# Patient Record
Sex: Female | Born: 1965
Health system: Southern US, Community
[De-identification: ages and names within clinical notes are randomized; demographics above are authoritative.]

## PROBLEM LIST (undated history)

## (undated) DIAGNOSIS — K219 Gastro-esophageal reflux disease without esophagitis: Secondary | ICD-10-CM

## (undated) DIAGNOSIS — I639 Cerebral infarction, unspecified: Secondary | ICD-10-CM

## (undated) DIAGNOSIS — M792 Neuralgia and neuritis, unspecified: Secondary | ICD-10-CM

## (undated) DIAGNOSIS — J45909 Unspecified asthma, uncomplicated: Secondary | ICD-10-CM

## (undated) DIAGNOSIS — K5792 Diverticulitis of intestine, part unspecified, without perforation or abscess without bleeding: Secondary | ICD-10-CM

## (undated) DIAGNOSIS — R0789 Other chest pain: Secondary | ICD-10-CM

## (undated) DIAGNOSIS — N8111 Cystocele, midline: Secondary | ICD-10-CM

## (undated) DIAGNOSIS — E785 Hyperlipidemia, unspecified: Secondary | ICD-10-CM

## (undated) DIAGNOSIS — J302 Other seasonal allergic rhinitis: Secondary | ICD-10-CM

## (undated) DIAGNOSIS — G4730 Sleep apnea, unspecified: Secondary | ICD-10-CM

## (undated) DIAGNOSIS — R0781 Pleurodynia: Secondary | ICD-10-CM

## (undated) DIAGNOSIS — M5430 Sciatica, unspecified side: Secondary | ICD-10-CM

## (undated) DIAGNOSIS — I10 Essential (primary) hypertension: Secondary | ICD-10-CM

## (undated) DIAGNOSIS — R1012 Left upper quadrant pain: Secondary | ICD-10-CM

## (undated) HISTORY — PX: APPENDECTOMY: SHX54

## (undated) HISTORY — PX: ABDOMINAL HYSTERECTOMY: SHX81

## (undated) HISTORY — DX: Gastro-esophageal reflux disease without esophagitis: K21.9

## (undated) HISTORY — PX: ANAL FISSURE REPAIR: SHX2312

## (undated) HISTORY — DX: Neuralgia and neuritis, unspecified: M79.2

## (undated) HISTORY — DX: Unspecified asthma, uncomplicated: J45.909

## (undated) HISTORY — DX: Cystocele, midline: N81.11

## (undated) HISTORY — DX: Cerebral infarction, unspecified: I63.9

## (undated) HISTORY — DX: Left upper quadrant pain: R10.12

## (undated) HISTORY — PX: WISDOM TOOTH EXTRACTION: SHX21

## (undated) HISTORY — DX: Pleurodynia: R07.81

## (undated) HISTORY — PX: FOOT SURGERY: SHX648

## (undated) HISTORY — DX: Other chest pain: R07.89

## (undated) HISTORY — PX: TUBAL LIGATION: SHX77

## (undated) HISTORY — PX: CHOLECYSTECTOMY: SHX55

---

## 1998-08-22 DIAGNOSIS — I639 Cerebral infarction, unspecified: Secondary | ICD-10-CM

## 1998-08-22 HISTORY — DX: Cerebral infarction, unspecified: I63.9

## 1999-08-23 DIAGNOSIS — I6930 Unspecified sequelae of cerebral infarction: Secondary | ICD-10-CM

## 1999-08-23 HISTORY — DX: Unspecified sequelae of cerebral infarction: I69.30

## 2011-08-22 ENCOUNTER — Emergency Department (HOSPITAL_COMMUNITY)
Admission: EM | Admit: 2011-08-22 | Discharge: 2011-08-22 | Disposition: A | Discharge status: Home / Self Care | Payer: Medicare Other | Attending: Emergency Medicine | Admitting: Emergency Medicine

## 2011-08-22 ENCOUNTER — Other Ambulatory Visit: Payer: Self-pay

## 2011-08-22 ENCOUNTER — Emergency Department (HOSPITAL_COMMUNITY): Payer: Medicare Other

## 2011-08-22 ENCOUNTER — Encounter: Payer: Self-pay | Admitting: *Deleted

## 2011-08-22 DIAGNOSIS — J069 Acute upper respiratory infection, unspecified: Secondary | ICD-10-CM

## 2011-08-22 DIAGNOSIS — IMO0001 Reserved for inherently not codable concepts without codable children: Secondary | ICD-10-CM | POA: Insufficient documentation

## 2011-08-22 DIAGNOSIS — R110 Nausea: Secondary | ICD-10-CM | POA: Insufficient documentation

## 2011-08-22 DIAGNOSIS — R0602 Shortness of breath: Secondary | ICD-10-CM | POA: Insufficient documentation

## 2011-08-22 DIAGNOSIS — I10 Essential (primary) hypertension: Secondary | ICD-10-CM | POA: Insufficient documentation

## 2011-08-22 DIAGNOSIS — R059 Cough, unspecified: Secondary | ICD-10-CM | POA: Insufficient documentation

## 2011-08-22 DIAGNOSIS — R05 Cough: Secondary | ICD-10-CM | POA: Insufficient documentation

## 2011-08-22 DIAGNOSIS — R51 Headache: Secondary | ICD-10-CM | POA: Insufficient documentation

## 2011-08-22 HISTORY — DX: Essential (primary) hypertension: I10

## 2011-08-22 HISTORY — DX: Cerebral infarction, unspecified: I63.9

## 2011-08-22 MED ORDER — HYDROCODONE-HOMATROPINE 5-1.5 MG/5ML PO SYRP: 5.0000 mL | ORAL_SOLUTION | Freq: Four times a day (QID) | ORAL | Status: AC | PRN

## 2011-08-22 MED ORDER — ACETAMINOPHEN 325 MG PO TABS
650.0000 mg | ORAL_TABLET | Freq: Once | ORAL | Status: AC
  Administered 2011-08-22: 650 mg via ORAL
  Filled 2011-08-22: qty 2

## 2011-08-22 MED ORDER — SALINE NASAL SPRAY 0.65 % NA SOLN: 1.0000 | NASAL | Status: DC | PRN

## 2011-08-22 MED ORDER — OXYMETAZOLINE HCL 0.05 % NA SOLN: 2.0000 | Freq: Two times a day (BID) | NASAL | Status: AC

## 2011-08-22 NOTE — ED Notes (Signed)
Pt states "the coughing, h/a, back ache, chest pain, hurting all over started yesterday, I was tx'd for bronchitis about a month ago"

## 2011-08-22 NOTE — ED Notes (Signed)
Pt reports having coughing, congestion, body aches all over x2 days. NAD noted at this time.

## 2011-08-22 NOTE — ED Notes (Signed)
PA at bedside.

## 2011-08-22 NOTE — ED Provider Notes (Signed)
History     CSN: [id number]  Arrival date & time 08/22/11  1233   First MD Initiated Contact with Patient 08/22/11 1339      Chief Complaint  Patient presents with  . Cough  . Headache  . Generalized Body Aches  . Nausea    (Consider location/radiation/quality/duration/timing/severity/associated sxs/prior treatment) Patient is a 45 y.o. female presenting with URI. The history is provided by the patient.  URI The primary symptoms include headaches, cough, nausea and myalgias. Primary symptoms do not include fever, fatigue, ear pain, sore throat, swollen glands, wheezing, abdominal pain, vomiting, arthralgias or rash. The current episode started yesterday. This is a new problem. The problem has not changed since onset. The onset of the illness is associated with exposure to sick contacts (son recently had pneumonia). Symptoms associated with the illness include chills, congestion and rhinorrhea. The illness is not associated with plugged ear sensation, facial pain or sinus pressure.  Sx started yesterday, consisting chiefly of headache, myalgias, cough. Was txed for bronchitis 1 mo ago. Is not immunocompromised.  Past Medical History  Diagnosis Date  . Hypertension   . Stroke     Past Surgical History  Procedure Date  . Appendectomy   . Cholecystectomy   . Abdominal hysterectomy     partial  . Anal fissure repair   . Tubal ligation     History reviewed. No pertinent family history.  History  Substance Use Topics  . Smoking status: Not on file  . Smokeless tobacco: Not on file  . Alcohol Use:     OB History    Grav Para Term Preterm Abortions TAB SAB Ect Mult Living                  Review of Systems  Constitutional: Positive for chills. Negative for fever and fatigue.  HENT: Positive for congestion and rhinorrhea. Negative for ear pain, sore throat and sinus pressure.   Respiratory: Positive for cough. Negative for wheezing.   Gastrointestinal: Positive for  nausea. Negative for vomiting and abdominal pain.  Musculoskeletal: Positive for myalgias. Negative for arthralgias.  Skin: Negative for rash.  Neurological: Positive for headaches.    Allergies  Compazine; Morphine and related; and Sulfa antibiotics  Home Medications   Current Outpatient Rx  Name Route Sig Dispense Refill  . HYDROCHLOROTHIAZIDE 25 MG PO TABS Oral Take 25 mg by mouth daily.      Marland Kitchen METOPROLOL SUCCINATE ER 100 MG PO TB24 Oral Take 100 mg by mouth daily.        BP 137/101  Pulse 99  Temp(Src) 98.6 F (37 C) (Oral)  Resp 18  Wt 170 lb (77.111 kg)  SpO2 100%  Physical Exam  Nursing note and vitals reviewed. Constitutional: She appears well-developed and well-nourished. No distress.  HENT:  Head: Normocephalic and atraumatic.       TMs nl b/l  Eyes: Conjunctivae are normal. Pupils are equal, round, and reactive to light. Right eye exhibits no discharge. Left eye exhibits no discharge.  Neck: Normal range of motion. Neck supple.  Cardiovascular: Normal rate, regular rhythm and normal heart sounds.   Pulmonary/Chest: Effort normal and breath sounds normal. No respiratory distress. She has no wheezes.  Abdominal: Soft. There is no tenderness.  Lymphadenopathy:    She has no cervical adenopathy.  Neurological: She is alert.  Skin: Skin is warm and dry. She is not diaphoretic.    ED Course  Procedures (including critical care time)   Date: 08/22/2011  Rate: 103  Rhythm: sinus tachycardia  QRS Axis: normal  Intervals: normal  ST/T Wave abnormalities: normal  Conduction Disutrbances:none  Narrative Interpretation: "noisy" ECG  Old EKG Reviewed: none available    Labs Reviewed - No data to display Dg Chest 2 View  08/22/2011  *RADIOLOGY REPORT*  Clinical Data: Shortness of breath, cough, congestion  CHEST - 2 VIEW  Comparison:   None  Findings: Cardiomediastinal silhouette is unremarkable.  Mild thoracic dextroscoliosis.  No acute infiltrate or pleural  effusion. No pulmonary edema.  IMPRESSION: No active disease.  Mild thoracic dextroscoliosis.  Original Report Authenticated By: Kathleen Solis, M.D.     1. Upper respiratory infection       MDM  This non-toxic appearing pt presents with URI sx which started 1 day ago. Exam, CXR unremarkable. Will tx as viral URI.        Kathleen Solis, [place] 08/22/11 [phone]

## 2011-08-23 NOTE — ED Provider Notes (Signed)
Medical screening examination/treatment/procedure(s) were performed by non-physician practitioner and as supervising physician I was immediately available for consultation/collaboration.  Hurman Horn, MD 08/23/11 [phone]

## 2014-11-24 ENCOUNTER — Emergency Department (HOSPITAL_COMMUNITY)
Admission: EM | Admit: 2014-11-24 | Discharge: 2014-11-24 | Disposition: A | Discharge status: Home / Self Care | Payer: Medicare PPO | Attending: Emergency Medicine | Admitting: Emergency Medicine

## 2014-11-24 ENCOUNTER — Emergency Department (HOSPITAL_COMMUNITY): Payer: Medicare PPO

## 2014-11-24 ENCOUNTER — Encounter (HOSPITAL_COMMUNITY): Payer: Self-pay | Admitting: *Deleted

## 2014-11-24 DIAGNOSIS — Z9049 Acquired absence of other specified parts of digestive tract: Secondary | ICD-10-CM | POA: Insufficient documentation

## 2014-11-24 DIAGNOSIS — I10 Essential (primary) hypertension: Secondary | ICD-10-CM | POA: Insufficient documentation

## 2014-11-24 DIAGNOSIS — Z79899 Other long term (current) drug therapy: Secondary | ICD-10-CM | POA: Diagnosis not present

## 2014-11-24 DIAGNOSIS — K5900 Constipation, unspecified: Secondary | ICD-10-CM | POA: Diagnosis not present

## 2014-11-24 DIAGNOSIS — R1084 Generalized abdominal pain: Secondary | ICD-10-CM | POA: Diagnosis present

## 2014-11-24 DIAGNOSIS — Z8673 Personal history of transient ischemic attack (TIA), and cerebral infarction without residual deficits: Secondary | ICD-10-CM | POA: Insufficient documentation

## 2014-11-24 DIAGNOSIS — Z90710 Acquired absence of both cervix and uterus: Secondary | ICD-10-CM | POA: Diagnosis not present

## 2014-11-24 HISTORY — DX: Diverticulitis of intestine, part unspecified, without perforation or abscess without bleeding: K57.92

## 2014-11-24 LAB — CBC WITH DIFFERENTIAL/PLATELET
Basophils Absolute: 0 10*3/uL (ref 0.0–0.1)
Basophils Relative: 1 % (ref 0–1)
Eosinophils Absolute: 0.1 10*3/uL (ref 0.0–0.7)
Eosinophils Relative: 1 % (ref 0–5)
HCT: 35.9 % — ABNORMAL LOW (ref 36.0–46.0)
Hemoglobin: 12.9 g/dL (ref 12.0–15.0)
Lymphocytes Relative: 27 % (ref 12–46)
Lymphs Abs: 1.7 10*3/uL (ref 0.7–4.0)
MCH: 32 pg (ref 26.0–34.0)
MCHC: 35.9 g/dL (ref 30.0–36.0)
MCV: 89.1 fL (ref 78.0–100.0)
Monocytes Absolute: 0.5 10*3/uL (ref 0.1–1.0)
Monocytes Relative: 8 % (ref 3–12)
Neutro Abs: 4 10*3/uL (ref 1.7–7.7)
Neutrophils Relative %: 63 % (ref 43–77)
Platelets: 210 10*3/uL (ref 150–400)
RBC: 4.03 MIL/uL (ref 3.87–5.11)
RDW: 13.2 % (ref 11.5–15.5)
WBC: 6.3 10*3/uL (ref 4.0–10.5)

## 2014-11-24 LAB — COMPREHENSIVE METABOLIC PANEL WITH GFR
ALT: 14 U/L (ref 0–35)
AST: 20 U/L (ref 0–37)
Albumin: 3.8 g/dL (ref 3.5–5.2)
Alkaline Phosphatase: 74 U/L (ref 39–117)
Anion gap: 10 (ref 5–15)
BUN: 7 mg/dL (ref 6–23)
CO2: 30 mmol/L (ref 19–32)
Calcium: 9.6 mg/dL (ref 8.4–10.5)
Chloride: 100 mmol/L (ref 96–112)
Creatinine, Ser: 0.78 mg/dL (ref 0.50–1.10)
GFR calc Af Amer: >90 mL/min
GFR calc non Af Amer: >90 mL/min
Glucose, Bld: 93 mg/dL (ref 70–99)
Potassium: 3.6 mmol/L (ref 3.5–5.1)
Sodium: 140 mmol/L (ref 135–145)
Total Bilirubin: 1.3 mg/dL — ABNORMAL HIGH (ref 0.3–1.2)
Total Protein: 6.6 g/dL (ref 6.0–8.3)

## 2014-11-24 LAB — LIPASE, BLOOD: Lipase: 21 U/L (ref 11–59)

## 2014-11-24 MED ORDER — ONDANSETRON HCL 4 MG PO TABS
4.0000 mg | ORAL_TABLET | Freq: Once | ORAL | Status: AC
  Administered 2014-11-24: 4 mg via ORAL
  Filled 2014-11-24: qty 1

## 2014-11-24 MED ORDER — BISACODYL 10 MG RE SUPP
20.0000 mg | Freq: Once | RECTAL | Status: AC
  Administered 2014-11-24: 20 mg via RECTAL
  Filled 2014-11-24: qty 2

## 2014-11-24 MED ORDER — POLYETHYLENE GLYCOL 3350 17 GM/SCOOP PO POWD: ORAL | Status: DC

## 2014-11-24 MED ORDER — ACETAMINOPHEN 325 MG PO TABS
650.0000 mg | ORAL_TABLET | Freq: Once | ORAL | Status: AC
  Administered 2014-11-24: 650 mg via ORAL
  Filled 2014-11-24: qty 2

## 2014-11-24 NOTE — ED Notes (Signed)
  Pt transported to ct 

## 2014-11-24 NOTE — ED Notes (Signed)
Pt with lower abdominal pain and constipation x 1 month.  Has been taking all manner of laxitives and enemas per UC with no relief.

## 2014-11-24 NOTE — ED Provider Notes (Signed)
CSN: [id number]     Arrival date & time 11/24/14  0753 History   First MD Initiated Contact with Patient 11/24/14 [phone]     Chief Complaint  Patient presents with  . Constipation  . Abdominal Pain     (Consider location/radiation/quality/duration/timing/severity/associated sxs/prior Treatment) Patient is a 49 y.o. female presenting with constipation and abdominal pain.  Constipation Severity:  Severe Time since last bowel movement:  4 weeks Timing:  Constant Progression:  Worsening Chronicity:  Recurrent Context: not dehydration and not narcotics   Stool description:  None produced Relieved by:  None tried Worsened by:  Nothing tried Ineffective treatments:  Enemas and laxatives Associated symptoms: abdominal pain and nausea   Associated symptoms: no diarrhea, no dysuria, no fever, no urinary retention and no vomiting   Abdominal pain:    Location:  Generalized   Quality:  Bloating   Severity:  Moderate   Timing:  Intermittent   Progression:  Waxing and waning   Chronicity:  New Nausea:    Severity:  Mild   Timing:  Constant   Progression:  Worsening Risk factors: hx of abdominal surgery   Risk factors: no recent antibiotic use   Abdominal Pain Associated symptoms: constipation and nausea   Associated symptoms: no chest pain, no chills, no cough, no diarrhea, no dysuria, no fever, no shortness of breath and no vomiting     Past Medical History  Diagnosis Date  . Hypertension   . Stroke   . Diverticulitis    Past Surgical History  Procedure Laterality Date  . Appendectomy    . Cholecystectomy    . Abdominal hysterectomy      partial  . Anal fissure repair    . Tubal ligation     No family history on file. History  Substance Use Topics  . Smoking status: Never Smoker   . Smokeless tobacco: Not on file  . Alcohol Use: No   OB History    No data available     Review of Systems  Constitutional: Negative for fever and chills.  HENT: Negative for  nosebleeds.   Eyes: Negative for visual disturbance.  Respiratory: Negative for cough and shortness of breath.   Cardiovascular: Negative for chest pain.  Gastrointestinal: Positive for nausea, abdominal pain and constipation. Negative for vomiting and diarrhea.  Genitourinary: Negative for dysuria.  Skin: Negative for rash.  Neurological: Negative for weakness.  All other systems reviewed and are negative.     Allergies  Compazine; Morphine and related; Percocet; and Sulfa antibiotics  Home Medications   Prior to Admission medications   Medication Sig Start Date End Date Taking? Authorizing Provider  bisacodyl (DULCOLAX) 10 MG suppository Place 10 mg rectally as needed for moderate constipation.   Yes Historical Provider, MD  hydrochlorothiazide (HYDRODIURIL) 25 MG tablet Take 25 mg by mouth daily.     Yes Historical Provider, MD  ibuprofen (ADVIL,MOTRIN) 200 MG tablet Take 400 mg by mouth every 6 (six) hours as needed for mild pain.   Yes Historical Provider, MD  lactulose (CHRONULAC) 10 GM/15ML solution Take 20 g by mouth 2 (two) times daily.   Yes Historical Provider, MD  magnesium hydroxide (MILK OF MAGNESIA) 400 MG/5ML suspension Take 15 mLs by mouth every 8 (eight) hours as needed for mild constipation.   Yes Historical Provider, MD  metoprolol (TOPROL-XL) 100 MG 24 hr tablet Take 100 mg by mouth daily.     Yes Historical Provider, MD  ondansetron (ZOFRAN-ODT) 4 MG disintegrating tablet  Take 4 mg by mouth every 6 (six) hours as needed for nausea or vomiting.   Yes Historical Provider, MD  polyethylene glycol powder (GLYCOLAX/MIRALAX) powder Please take two scoops twice daily in a drink of your choice until you are having regular bowel movements 11/24/14   Silas FloodErik Kiyoto Slomski, MD  sodium chloride (OCEAN NASAL SPRAY) 0.65 % nasal spray Place 1 spray into the nose as needed for congestion. Patient not taking: Reported on 11/24/2014 08/22/11 08/21/12  Grant Fontanaatherine Williams, PA-C   BP 118/70 mmHg   Pulse 60  Temp(Src) 98 F (36.7 C) (Oral)  Resp 16  Ht 5' 5.5" (1.664 m)  Wt 165 lb (74.844 kg)  BMI 27.03 kg/m2  SpO2 96%  LMP  Physical Exam  Constitutional: She is oriented to person, place, and time. No distress.  HENT:  Head: Normocephalic and atraumatic.  Eyes: EOM are normal. Pupils are equal, round, and reactive to light.  Neck: Normal range of motion. Neck supple.  Cardiovascular: Normal rate and intact distal pulses.   Pulmonary/Chest: No respiratory distress.  Abdominal: Soft. There is tenderness (periumbilical). There is no rebound and no guarding.  Musculoskeletal: Normal range of motion.  Neurological: She is alert and oriented to person, place, and time.  Skin: No rash noted. She is not diaphoretic.  Psychiatric: She has a normal mood and affect.    ED Course  Procedures (including critical care time) Labs Review Labs Reviewed  CBC WITH DIFFERENTIAL/PLATELET - Abnormal; Notable for the following:    HCT 35.9 (*)    All other components within normal limits  COMPREHENSIVE METABOLIC PANEL - Abnormal; Notable for the following:    Total Bilirubin 1.3 (*)    All other components within normal limits  LIPASE, BLOOD  URINALYSIS, ROUTINE W REFLEX MICROSCOPIC    Imaging Review Ct Abdomen Pelvis Wo Contrast  11/24/2014   CLINICAL DATA:  One month history of constipation lower abdominal pain  EXAM: CT ABDOMEN AND PELVIS WITHOUT CONTRAST  TECHNIQUE: Multidetector CT imaging of the abdomen and pelvis was performed following the standard protocol without oral or intravenous contrast material administration.  COMPARISON:  None.  FINDINGS: There is mild atelectatic change in the posterior right lung base. Lung bases are otherwise clear.  No focal liver lesions are identified on this noncontrast enhanced study. Gallbladder is absent. There is no biliary duct dilatation appreciable.  Spleen, pancreas, and adrenals appear normal. Kidneys bilaterally show no mass or hydronephrosis  on either side. There is no renal or ureteral calculus on either side.  In the pelvis, the urinary bladder is midline with normal wall thickness. The rectum is mildly distended with stool. There is a cyst arising from the left ovary measuring 3.3 x 2.6 x 2.7 cm. No other pelvic mass seen. No pelvic fluid collection. The appendix is absent.  There is diffuse stool throughout the colon.  There is no bowel obstruction. No free air or portal venous air. There is no demonstrable ascites, adenopathy, or abscess in the abdomen or pelvis. There is no demonstrable abdominal aortic aneurysm. There are no blastic or lytic bone lesions.  IMPRESSION: Diffuse stool throughout colon consistent with constipation.  Benign-appearing left ovarian cyst.  No bowel obstruction.  No abscess.  Appendix and gallbladder absent.   Electronically Signed   By: Bretta BangWilliam  Woodruff III M.D.   On: 11/24/2014 09:26     EKG Interpretation None      MDM   Final diagnoses:  Constipation, unspecified constipation type   48  y/o female w/ PMH hysterectomy, diverticulitis, comes in with complaint of constipation X1 month.  Seen at urgent care on 3/30 and started on mag citrate w/o result.  Last bowel movement one month ago.  The constipation has been associated with generalized abdominal pain and nausea but no vomiting.  VSS.  ttp periumbilical. No rebound/guarding.  Concern for obstruction vs. Diverticulitis.  Feel SBO or other obstruction is relatively unlikely given benign abdominal exam and no vomiting after reportedly one month without a bowel movement.  Her normal bowel movement frequency is once weekly.    Will obtain labs and CT abdomen/pelvis w/o contrast. Will give colace suppositories.  Patient has been able to have a small bowel movement here in the department.  Cbc/cmp/lipase WNL.  CT abdomen/pelvis with constipation but no obstruction.  Patient not able to urinate at this time but reports no dysuria and has had  previous hysterectomy.  Doubt UTI given no dysuria/fever/leukocytosis.  Feel safe for d/c on miralax.  I have discussed the results, Dx and Tx plan with the patient. They expressed understanding and agree with the plan and were told to return to ED with any worsening of condition or concern.    Disposition: Discharge  Condition: Good  New Prescriptions   POLYETHYLENE GLYCOL POWDER (GLYCOLAX/MIRALAX) POWDER    Please take two scoops twice daily in a drink of your choice until you are having regular bowel movements    Follow Up: No follow-up provider specified.  Pt seen in conjunction with Dr. Imagene Sheller, MD 11/24/14 1055  Cathren Laine, MD 11/24/14 [phone]

## 2014-11-24 NOTE — Discharge Instructions (Signed)

## 2014-11-24 NOTE — ED Notes (Signed)
Pt reports having a small bowel movement. Continues to report pain and nausea. MD aware.

## 2014-12-29 ENCOUNTER — Encounter (HOSPITAL_COMMUNITY): Payer: Self-pay

## 2014-12-29 ENCOUNTER — Emergency Department (HOSPITAL_COMMUNITY): Payer: Medicare PPO

## 2014-12-29 ENCOUNTER — Emergency Department (HOSPITAL_COMMUNITY)
Admission: EM | Admit: 2014-12-29 | Discharge: 2014-12-29 | Disposition: A | Discharge status: Home / Self Care | Payer: Medicare PPO | Attending: Emergency Medicine | Admitting: Emergency Medicine

## 2014-12-29 DIAGNOSIS — I10 Essential (primary) hypertension: Secondary | ICD-10-CM | POA: Diagnosis not present

## 2014-12-29 DIAGNOSIS — Z9049 Acquired absence of other specified parts of digestive tract: Secondary | ICD-10-CM | POA: Diagnosis not present

## 2014-12-29 DIAGNOSIS — Z8673 Personal history of transient ischemic attack (TIA), and cerebral infarction without residual deficits: Secondary | ICD-10-CM | POA: Insufficient documentation

## 2014-12-29 DIAGNOSIS — N83202 Unspecified ovarian cyst, left side: Secondary | ICD-10-CM

## 2014-12-29 DIAGNOSIS — Z90710 Acquired absence of both cervix and uterus: Secondary | ICD-10-CM | POA: Insufficient documentation

## 2014-12-29 DIAGNOSIS — Z9851 Tubal ligation status: Secondary | ICD-10-CM | POA: Diagnosis not present

## 2014-12-29 DIAGNOSIS — R1031 Right lower quadrant pain: Secondary | ICD-10-CM | POA: Diagnosis present

## 2014-12-29 DIAGNOSIS — N8320 Unspecified ovarian cysts: Secondary | ICD-10-CM | POA: Insufficient documentation

## 2014-12-29 DIAGNOSIS — Z8719 Personal history of other diseases of the digestive system: Secondary | ICD-10-CM | POA: Diagnosis not present

## 2014-12-29 DIAGNOSIS — R109 Unspecified abdominal pain: Secondary | ICD-10-CM

## 2014-12-29 DIAGNOSIS — Z79899 Other long term (current) drug therapy: Secondary | ICD-10-CM | POA: Insufficient documentation

## 2014-12-29 LAB — CBC WITH DIFFERENTIAL/PLATELET
Basophils Absolute: 0 10*3/uL (ref 0.0–0.1)
Basophils Relative: 0 % (ref 0–1)
Eosinophils Absolute: 0.1 10*3/uL (ref 0.0–0.7)
Eosinophils Relative: 1 % (ref 0–5)
HCT: 38.7 % (ref 36.0–46.0)
Hemoglobin: 14 g/dL (ref 12.0–15.0)
Lymphocytes Relative: 17 % (ref 12–46)
Lymphs Abs: 1.6 10*3/uL (ref 0.7–4.0)
MCH: 32.3 pg (ref 26.0–34.0)
MCHC: 36.2 g/dL — ABNORMAL HIGH (ref 30.0–36.0)
MCV: 89.4 fL (ref 78.0–100.0)
Monocytes Absolute: 0.6 10*3/uL (ref 0.1–1.0)
Monocytes Relative: 7 % (ref 3–12)
Neutro Abs: 7.2 10*3/uL (ref 1.7–7.7)
Neutrophils Relative %: 75 % (ref 43–77)
Platelets: 253 10*3/uL (ref 150–400)
RBC: 4.33 MIL/uL (ref 3.87–5.11)
RDW: 13.6 % (ref 11.5–15.5)
WBC: 9.6 10*3/uL (ref 4.0–10.5)

## 2014-12-29 LAB — URINALYSIS W MICROSCOPIC (NOT AT ARMC)
Bilirubin Urine: NEGATIVE
Glucose, UA: NEGATIVE mg/dL
Hgb urine dipstick: NEGATIVE
Ketones, ur: NEGATIVE mg/dL
Leukocytes, UA: NEGATIVE
Nitrite: NEGATIVE
Protein, ur: NEGATIVE mg/dL
Specific Gravity, Urine: 1.002 — ABNORMAL LOW (ref 1.005–1.030)
Urobilinogen, UA: 0.2 mg/dL (ref 0.0–1.0)
pH: 7 (ref 5.0–8.0)

## 2014-12-29 LAB — COMPREHENSIVE METABOLIC PANEL
ALT: 10 U/L — ABNORMAL LOW (ref 14–54)
AST: 16 U/L (ref 15–41)
Albumin: 4.2 g/dL (ref 3.5–5.0)
Alkaline Phosphatase: 82 U/L (ref 38–126)
Anion gap: 7 (ref 5–15)
BUN: 12 mg/dL (ref 6–20)
CO2: 29 mmol/L (ref 22–32)
Calcium: 9.4 mg/dL (ref 8.9–10.3)
Chloride: 101 mmol/L (ref 101–111)
Creatinine, Ser: 0.58 mg/dL (ref 0.44–1.00)
GFR calc Af Amer: >60 mL/min (ref >60)
GFR calc non Af Amer: >60 mL/min (ref >60)
Glucose, Bld: 99 mg/dL (ref 70–99)
Potassium: 3.7 mmol/L (ref 3.5–5.1)
Sodium: 137 mmol/L (ref 135–145)
Total Bilirubin: 1.7 mg/dL — ABNORMAL HIGH (ref 0.3–1.2)
Total Protein: 7.8 g/dL (ref 6.5–8.1)

## 2014-12-29 LAB — LIPASE, BLOOD: Lipase: 18 U/L — ABNORMAL LOW (ref 22–51)

## 2014-12-29 LAB — POC OCCULT BLOOD, ED: Fecal Occult Bld: NEGATIVE

## 2014-12-29 MED ORDER — IOHEXOL 300 MG/ML  SOLN
100.0000 mL | Freq: Once | INTRAMUSCULAR | Status: AC | PRN
  Administered 2014-12-29: 100 mL via INTRAVENOUS

## 2014-12-29 MED ORDER — ONDANSETRON HCL 4 MG PO TABS: 4.0000 mg | ORAL_TABLET | Freq: Three times a day (TID) | ORAL | Status: DC | PRN

## 2014-12-29 MED ORDER — HYDROMORPHONE HCL 1 MG/ML IJ SOLN
0.5000 mg | Freq: Once | INTRAMUSCULAR | Status: AC
  Administered 2014-12-29: 0.5 mg via INTRAVENOUS
  Filled 2014-12-29: qty 1

## 2014-12-29 MED ORDER — HYDROCODONE-ACETAMINOPHEN 5-325 MG PO TABS: ORAL_TABLET | ORAL | Status: DC

## 2014-12-29 MED ORDER — IOHEXOL 300 MG/ML  SOLN
50.0000 mL | Freq: Once | INTRAMUSCULAR | Status: AC | PRN
  Administered 2014-12-29: 50 mL via ORAL

## 2014-12-29 MED ORDER — ONDANSETRON HCL 4 MG/2ML IJ SOLN
4.0000 mg | Freq: Once | INTRAMUSCULAR | Status: AC
  Administered 2014-12-29: 4 mg via INTRAVENOUS
  Filled 2014-12-29: qty 2

## 2014-12-29 MED ORDER — SODIUM CHLORIDE 0.9 % IV BOLUS (SEPSIS)
1000.0000 mL | Freq: Once | INTRAVENOUS | Status: AC
Start: 2014-12-29 — End: 2014-12-29
  Administered 2014-12-29: 1000 mL via INTRAVENOUS

## 2014-12-29 NOTE — ED Notes (Addendum)
Pt states abdominal pain, lower abdomen with back pain entire lower back.  Pt states no n/v/d.  Pt states hx of diverticulitis.  No change in urination.  No fever.

## 2014-12-29 NOTE — ED Provider Notes (Signed)
CSN: [id number]     Arrival date & time 12/29/14  0751 History   First MD Initiated Contact with Patient 12/29/14 [phone]     Chief Complaint  Patient presents with  . Abdominal Pain  . Back Pain     (Consider location/radiation/quality/duration/timing/severity/associated sxs/prior Treatment) HPI   Kathleen Solis is a 49 y.o. female complaining of 10 out of 10 bilateral lower abdominal pain radiating around to the back consistent with prior episode of diverticulitis. Symptoms onset yesterday. Patient denies fever, chills, nausea, vomiting, diarrhea, melena, hematochezia, change in urination. States that the pain is exacerbated with walking. She's taking ibuprofen at home with little relief.   Past Medical History  Diagnosis Date  . Hypertension   . Stroke   . Diverticulitis    Past Surgical History  Procedure Laterality Date  . Appendectomy    . Cholecystectomy    . Abdominal hysterectomy      partial  . Anal fissure repair    . Tubal ligation     History reviewed. No pertinent family history. History  Substance Use Topics  . Smoking status: Never Smoker   . Smokeless tobacco: Not on file  . Alcohol Use: No   OB History    No data available     Review of Systems  10 systems reviewed and found to be negative, except as noted in the HPI.   Allergies  Compazine; Morphine and related; Percocet; and Sulfa antibiotics  Home Medications   Prior to Admission medications   Medication Sig Start Date End Date Taking? Authorizing Provider  hydrochlorothiazide (HYDRODIURIL) 25 MG tablet Take 25 mg by mouth daily.     Yes Historical Provider, MD  ibuprofen (ADVIL,MOTRIN) 200 MG tablet Take 400 mg by mouth every 6 (six) hours as needed for mild pain.   Yes Historical Provider, MD  lactulose (CHRONULAC) 10 GM/15ML solution Take 20 g by mouth 2 (two) times daily as needed for moderate constipation.    Yes Historical Provider, MD  magnesium hydroxide (MILK OF MAGNESIA) 400 MG/5ML  suspension Take 15 mLs by mouth every 8 (eight) hours as needed for mild constipation.   Yes Historical Provider, MD  metoprolol (LOPRESSOR) 100 MG tablet Take 100 mg by mouth daily.   Yes Historical Provider, MD  polyethylene glycol powder (GLYCOLAX/MIRALAX) powder Please take two scoops twice daily in a drink of your choice until you are having regular bowel movements 11/24/14  Yes Silas Flood, MD  HYDROcodone-acetaminophen (NORCO/VICODIN) 5-325 MG per tablet Take 1-2 tablets by mouth every 6 hours as needed for pain and/or cough. 12/29/14   Vielka Klinedinst, PA-C  ondansetron (ZOFRAN) 4 MG tablet Take 1 tablet (4 mg total) by mouth every 8 (eight) hours as needed for nausea or vomiting. 12/29/14   Joni Reining Raiza Kiesel, PA-C  ondansetron (ZOFRAN-ODT) 4 MG disintegrating tablet Take 4 mg by mouth every 6 (six) hours as needed for nausea or vomiting.    Historical Provider, MD  sodium chloride (OCEAN NASAL SPRAY) 0.65 % nasal spray Place 1 spray into the nose as needed for congestion. Patient not taking: Reported on 11/24/2014 08/22/11 08/21/12  Grant Fontana, PA-C   BP 119/81 mmHg  Pulse 54  Temp(Src) 97.6 F (36.4 C) (Oral)  Resp 16  SpO2 100% Physical Exam  Constitutional: She is oriented to person, place, and time. She appears well-developed and well-nourished. No distress.  HENT:  Head: Normocephalic.  Mouth/Throat: Oropharynx is clear and moist.  Eyes: Conjunctivae and EOM are normal.  Neck:  Normal range of motion.  Cardiovascular: Normal rate, regular rhythm and intact distal pulses.   Pulmonary/Chest: Effort normal and breath sounds normal. No stridor. No respiratory distress. She has no wheezes. She has no rales. She exhibits no tenderness.  Abdominal: Soft. She exhibits no distension and no mass. There is tenderness. There is no rebound and no guarding.  Hyperactive bowel sounds, tender to light palpation of the bilateral lower quadrants no guarding or rebound.  Musculoskeletal: Normal  range of motion.  Neurological: She is alert and oriented to person, place, and time.  Psychiatric: She has a normal mood and affect.  Nursing note and vitals reviewed.   ED Course  Procedures (including critical care time) Labs Review Labs Reviewed  CBC WITH DIFFERENTIAL/PLATELET - Abnormal; Notable for the following:    MCHC 36.2 (*)    All other components within normal limits  COMPREHENSIVE METABOLIC PANEL - Abnormal; Notable for the following:    ALT 10 (*)    Total Bilirubin 1.7 (*)    All other components within normal limits  LIPASE, BLOOD - Abnormal; Notable for the following:    Lipase 18 (*)    All other components within normal limits  URINALYSIS W MICROSCOPIC - Abnormal; Notable for the following:    Specific Gravity, Urine 1.002 (*)    All other components within normal limits  POC OCCULT BLOOD, ED    Imaging Review [place] Transvaginal Non-ob  12/29/2014   CLINICAL DATA:  Abdominal, pelvic, and back pain for 2 days, abnormal CT with cystic changes LEFT adnexa  EXAM: TRANSABDOMINAL AND TRANSVAGINAL ULTRASOUND OF PELVIS  TECHNIQUE: Both transabdominal and transvaginal ultrasound examinations of the pelvis were performed. Transabdominal technique was performed for global imaging of the pelvis including uterus, ovaries, adnexal regions, and pelvic cul-de-sac. It was necessary to proceed with endovaginal exam following the transabdominal exam to visualize the ovaries. Degradation of image quality secondary to body habitus.  COMPARISON:  CT abdomen pelvis 12/29/2014  FINDINGS: Uterus  Surgically absent  Endometrium  N/A  Right ovary  Measurements: 2.2 x 1.3 x 1.9 cm. Suboptimally visualized due to bowel gas. No gross evidence of mass or cyst.  Left ovary  Measurements: 6.8 x 2.9 x 5.2 cm. Complex cyst with partial septation LEFT ovary 4.0 x 2.7 x 4.1 cm. Blood flow present within LEFT ovary on color Doppler imaging.  Other findings  No free pelvic fluid or additional adnexal masses.   IMPRESSION: Post hysterectomy.  Grossly unremarkable RIGHT ovary.  Complex cystic lesion within LEFT ovary 4.0 x 2.7 x 4.1 cm in size containing a partial septation.  Followup imaging recommended in 6-12 weeks in order to reassess this cystic lesion, in order to exclude cystic tumor.   Electronically Signed   By: Ulyses SouthwardMark  Boles M.D.   On: 12/29/2014 12:55   [place] Pelvis Complete  12/29/2014   CLINICAL DATA:  Abdominal, pelvic, and back pain for 2 days, abnormal CT with cystic changes LEFT adnexa  EXAM: TRANSABDOMINAL AND TRANSVAGINAL ULTRASOUND OF PELVIS  TECHNIQUE: Both transabdominal and transvaginal ultrasound examinations of the pelvis were performed. Transabdominal technique was performed for global imaging of the pelvis including uterus, ovaries, adnexal regions, and pelvic cul-de-sac. It was necessary to proceed with endovaginal exam following the transabdominal exam to visualize the ovaries. Degradation of image quality secondary to body habitus.  COMPARISON:  CT abdomen pelvis 12/29/2014  FINDINGS: Uterus  Surgically absent  Endometrium  N/A  Right ovary  Measurements: 2.2 x 1.3 x 1.9  cm. Suboptimally visualized due to bowel gas. No gross evidence of mass or cyst.  Left ovary  Measurements: 6.8 x 2.9 x 5.2 cm. Complex cyst with partial septation LEFT ovary 4.0 x 2.7 x 4.1 cm. Blood flow present within LEFT ovary on color Doppler imaging.  Other findings  No free pelvic fluid or additional adnexal masses.  IMPRESSION: Post hysterectomy.  Grossly unremarkable RIGHT ovary.  Complex cystic lesion within LEFT ovary 4.0 x 2.7 x 4.1 cm in size containing a partial septation.  Followup imaging recommended in 6-12 weeks in order to reassess this cystic lesion, in order to exclude cystic tumor.   Electronically Signed   By: Ulyses SouthwardMark  Boles M.D.   On: 12/29/2014 12:55   Ct Abdomen Pelvis W Contrast  12/29/2014   CLINICAL DATA:  Abdominal pain  EXAM: CT ABDOMEN AND PELVIS WITH CONTRAST  TECHNIQUE: Multidetector CT imaging of  the abdomen and pelvis was performed using the standard protocol following bolus administration of intravenous contrast.  CONTRAST:  50mL OMNIPAQUE IOHEXOL 300 MG/ML SOLN, 100mL OMNIPAQUE IOHEXOL 300 MG/ML SOLN  COMPARISON:  11/24/2014  FINDINGS: Cystic lesion in the left adnexa has enlarged and now measures greater than 4.5 cm. Previously, it measured with a maximal diameter of 3.3 cm. There is stranding in the retroperitoneal fat posterior and inferior to this lesion.  Diffuse hepatic steatosis  Postcholecystectomy  Prominent stool burden throughout the length of the colon.  Spleen, pancreas, adrenal glands, and kidneys are within normal limits  No abnormal adenopathy by measurement criteria.  No vertebral compression deformity.  IMPRESSION: The cystic abnormality within the left adnexa has enlarged. Inflammatory changes in the adjacent retroperitoneal fat have also developed. Ultrasound is recommended to further characterize. Underlying cystic neoplasm cannot be excluded.   Electronically Signed   By: Jolaine ClickArthur  Hoss M.D.   On: 12/29/2014 10:28     EKG Interpretation None      MDM   Final diagnoses:  Left ovarian cyst    Filed Vitals:   12/29/14 0756 12/29/14 1252  BP: 124/77 119/81  Pulse: 66 54  Temp: 98.9 F (37.2 C) 97.6 F (36.4 C)  TempSrc: Oral Oral  Resp: 16 16  SpO2: 100% 100%    Medications  sodium chloride 0.9 % bolus 1,000 mL (0 mLs Intravenous Stopped 12/29/14 1046)  HYDROmorphone (DILAUDID) injection 0.5 mg (0.5 mg Intravenous Given 12/29/14 0855)  ondansetron (ZOFRAN) injection 4 mg (4 mg Intravenous Given 12/29/14 0855)  iohexol (OMNIPAQUE) 300 MG/ML solution 50 mL (50 mLs Oral Contrast Given 12/29/14 0925)  iohexol (OMNIPAQUE) 300 MG/ML solution 100 mL (100 mLs Intravenous Contrast Given 12/29/14 1014)    Dawnisha Sharol HarnessSimmons is a pleasant 49 y.o. female presenting with severe lower abdominal pain radiating to the back bilaterally. States that this feels like prior episode of  diverticulitis. Patient is afebrile, well-appearing, no change in bowel or bladder habits. [facility] patient was admitted for diverticulitis on 07/25/2013. Read of CT showed diverticulitis with inflammatory changes no abscess or extraluminal air. Serial abdominal exams remain benign. Patient is given IV Dilaudid which has reduced her pain to 7 out of 10, I've offered the patient more pain medication but she declines. Mild elevation and T bili of 1.7. Fecal occult is negative, urine is normal and no other significant abnormalities are seen on blood work. Patient is pending CT.  CT abdomen pelvis shows a cystic lesion in the left adnexa that is enlarging at 4.5 cm. Stranding  and retroperitoneal fat posterior to the lesion. Recommend ultrasound to further evaluate. Updated patient who agrees with to ultrasound.  Ultrasound shows a complex left ovarian cyst with a partial septation. I discussed this at length with the patient's and advised her to follow up at [facility], she understands that she will need a repeat ultrasound in 6-12 weeks in order to reassess.  Serial abdominal exams remained nonsurgical, patient is amenable to discharge. Will return to the ED for worsening symptoms  Evaluation does not show pathology that would require ongoing emergent intervention or inpatient treatment. Pt is hemodynamically stable and mentating appropriately. Discussed findings and plan with patient/guardian, who agrees with care plan. All questions answered. Return precautions discussed and outpatient follow up given.   New Prescriptions   HYDROCODONE-ACETAMINOPHEN (NORCO/VICODIN) 5-325 MG PER TABLET    Take 1-2 tablets by mouth every 6 hours as needed for pain and/or cough.   ONDANSETRON (ZOFRAN) 4 MG TABLET    Take 1 tablet (4 mg total) by mouth every 8 (eight) hours as needed for nausea or vomiting.         Wynetta Emery, PA-C 12/29/14 1529  Samuel Jester, DO 12/31/14 1316

## 2014-12-29 NOTE — ED Notes (Signed)
Pt to CT at this time.

## 2014-12-29 NOTE — Discharge Instructions (Signed)
Take vicodin for breakthrough pain, do not drink alcohol, drive, care for children or do other critical tasks while taking vicodin.  Do not hesitate to return to the emergency room for any new, worsening or concerning symptoms.  Please obtain primary care using resource guide below. Let them know that you were seen in the emergency room and that they will need to obtain records for further outpatient management.   Ovarian Cyst An ovarian cyst is a sac filled with fluid or blood. This sac is attached to the ovary. Some cysts go away on their own. Other cysts need treatment.  HOME CARE   Only take medicine as told by your doctor.  Follow up with your doctor as told.  Get regular pelvic exams and Pap tests. GET HELP IF:  Your periods are late, not regular, or painful.  You stop having periods.  Your belly (abdominal) or pelvic pain does not go away.  Your belly becomes large or puffy (swollen).  You have a hard time peeing (totally emptying your bladder).  You have pressure on your bladder.  You have pain during sex.  You feel fullness, pressure, or discomfort in your belly.  You lose weight for no reason.  You feel sick most of the time.  You have a hard time pooping (constipation).  You do not feel like eating.  You develop pimples (acne).  You have an increase in hair on your body and face.  You are gaining weight for no reason.  You think you are pregnant. GET HELP RIGHT AWAY IF:   Your belly pain gets worse.  You feel sick to your stomach (nauseous), and you throw up (vomit).  You have a fever that comes on fast.  You have belly pain while pooping (bowel movement).  Your periods are heavier than usual. MAKE SURE YOU:   Understand these instructions.  Will watch your condition.  Will get help right away if you are not doing well or get worse. Document Released: 01/25/2008 Document Revised: 05/29/2013 Document Reviewed: 04/15/2013 [facility] Patient  Information 2015 [place], [place]. This information is not intended to replace advice given to you by your health care provider. Make sure you discuss any questions you have with your health care provider.   Emergency Department Resource Guide 1) Find a Doctor and Pay Out of Pocket Although you won't have to find out who is covered by your insurance plan, it is a good idea to ask around and get recommendations. You will then need to call the office and see if the doctor you have chosen will accept you as a new patient and what types of options they offer for patients who are self-pay. Some doctors offer discounts or will set up payment plans for their patients who do not have insurance, but you will need to ask so you aren't surprised when you get to your appointment.  2) Contact Your Local Health Department Not all health departments have doctors that can see patients for sick visits, but many do, so it is worth a call to see if yours does. If you don't know where your local health department is, you can check in your phone book. The CDC also has a tool to help you locate your state's health department, and many state websites also have listings of all of their local health departments.  3) Find a Walk-in Clinic If your illness is not likely to be very severe or complicated, you may want to try a walk in  clinic. These are popping up all over the country in pharmacies, drugstores, and shopping centers. They're usually staffed by nurse practitioners or physician assistants that have been trained to treat common illnesses and complaints. They're usually fairly quick and inexpensive. However, if you have serious medical issues or chronic medical problems, these are probably not your best option.  No Primary Care Doctor: - Call Health Connect at  [phone] - they can help you locate a primary care doctor that  accepts your insurance, provides certain services, etc. - Physician Referral Service-  [phone]  Chronic Pain Problems: Organization         Address  Phone   Notes  [facility]  [phone] Patients need to be referred by their primary care doctor.   Medication Assistance: Organization         Address  Phone   Notes  [facility] Medication [facility] [address] [place], [place] [address] [phone]) [phone] --Must be a resident of [facility] [place] [facility] -- Must have NO insurance coverage whatsoever (no Medicaid/ Medicare, etc.) -- The pt. MUST have a primary care doctor that directs their care regularly and follows them in the community   MedAssist  [phone]   [employer]  [phone]) [phone]    Agencies that provide inexpensive medical care: Organization         Address  Phone   Notes  [facility]  [phone](336) [phone]   [facility]    [phone]) [phone]   [facility] [address] [place], [place] [address] [phone]) [phone]   [facility] [place] [address], [place] [phone]) [phone]   [facility]anned Parenthood    [phone](336) [phone]   [facility]    [phone]   [facility]  [address], [place] Phone:  [phone](336) [phone], Fax:  [phone](336) [phone] Hours of Operation:  9 am - 6 pm, M-F.  Also accepts Medicaid/Medicare and self-pay.  [facility]  [address], [place] Phone: [phone](336) [phone], Fax: [phone]. Hours of Operation:  8:30 am - 5:30 pm, M-F.  Also accepts Medicaid and self-pay.  [facility] [place] [address], [place] Phone: [phone](336) [phone]   [facility] [address], [place], [place] [phone], Ext. [phone] Mondays & Thursdays: 7-9 AM.  First 15 patients are seen on a first come, first serve basis.    Medicaid-accepting [facility] [place] Providers:  Organization         Address  Phone   Notes  [facility] [address],  [place] [phone]) [phone] Also accepts self-pay patients.  [facility] [address], [place]  [phone]) [phone]   [facility] [address], [place] [phone]) [phone]   [facility] [address] [place] [address], [place] [phone]) [phone]   Renaye RakersVeita Bland [address], [place]   [phone]) [phone] Only accepts [place] Access [place]icaid patients after they have their name applied to their card.   Self-Pay (no insurance) in [facility] [place]:  Organization         Address  Phone   Notes  Sickle Cell Patients, [facility] [address], [place] [phone]) [phone]   [facility] [address], [place] [phone]) [phone]   [facility] [place]  [address], [place] [phone]) [phone]   [facility]/Dr. Osei-Bonsu  [address] [place] [address], [place] or [address], [place] [phone] Phone number for both [place] and [place] locations is the same.  [facility] [address], [place] [phone]) [phone]   [facility] [address] [place] [address], [place] or [address] [phone]) [phone]   [facility] [address], [place] [phone](336) [phone], phone; [phone]) [phone], fax Sees patients 1st and 3rd Saturday of every month.  Must not qualify for public or private insurance (i.e. Medicaid, Medicare, [place] Health Choice, Veterans' Benefits)  Household income should be no more than 200% of the poverty level The clinic cannot treat you if you are pregnant or think you are pregnant  Sexually transmitted diseases are not treated at the clinic.    Dental Care: Organization         Address  Phone  Notes  [facility] of [facility] [address], [place] [phone] Accepts children up to age 48 who are enrolled in  [place]icaid or [place] Health Choice; pregnant women with a Medicaid card; and children who have applied for Medicaid or [place] Health Choice, but were declined, whose parents can pay a reduced fee at time of service.  [facility] of [facility] [place]  [address], [place] [phone]) [phone] Accepts children up to age 48 who are enrolled in [place]icaid or [place] Health Choice; pregnant women with a Medicaid card; and children who have applied for Medicaid or [place] Health Choice, but were declined, whose parents can pay a reduced fee at time of service.  [facility]  [address], [place] [phone]) [phone] Patients are seen by appointment only. Walk-ins are not accepted. [facility] will see patients 48 years of age and older. Monday - Tuesday (8am-5pm) Most Wednesdays (8:30-5pm) $30 per visit, cash only  [facility]  [address], [facility] [phone] Patients are seen by appointment only. Walk-ins are not accepted. [facility] will see patients 48 years of age and older. One Wednesday Evening (Monthly: Volunteer Based).  $30 per visit, cash only  [employer] [facility]  [phone] for adults; Children under age 714, call Graduate Pediatric Dentistry at [phone]. Children aged 334-14, please call [phone] to request a pediatric application.  Dental services are provided in all areas of dental care including fillings, crowns and bridges, complete and partial dentures, implants, gum treatment, root canals, and extractions. Preventive care is also provided. Treatment is provided to both adults and children. Patients are selected via a lottery and there is often a waiting list.   [facility] [address], [place]  [phone]) [phone] [email or web address]   [facility] [address], [place], [place] [phone], Ext. [phone] Second and Fourth Thursday of each month, opens at 6:30  AM; Clinic ends at 9 AM.  Patients are seen on a first-come first-served basis, and a limited number are seen during each clinic.   [facility]  [address], [place], [place] [phone]) [phone]   Eligibility Requirements You must have lived in [place], [place], or [place] counties for at least the last three months.   You cannot be eligible for state or federal sponsored National Cityhealthcare insurance, including  CIGNAVeterans Administration, [place]Medicaid, or Harrah[employer] EntertainmentMedicare.   You generally cannot be eligible for healthcare insurance through your employer.    How to apply: Eligibility screenings are held every Tuesday and Wednesday afternoon from 1:00 pm until 4:00 pm. You do not need an appointment for the interview!  [facility] [address], [place], [place] [phone]   [facility]  [phone]   [facility]  [phone]   [facility]  [phone]    Behavioral Health Resources in the Community: Intensive Outpatient Programs Organization         Address  Phone  Notes  [facility] [address], [place], [place] [phone]   [facility] [address], [place], [place] [phone]   [facility] [address], [place], [place]  [phone]   [facility] [address],  [place], [place] [phone] or [phone]   Substance Abuse Resources Organization         Address  Phone  Notes  [facility]  [phone]   [facility]  [phone]   [facility]  [phone]   [facility]  [phone]   Residential & Outpatient Substance Abuse Program  [phone]   Psychological Services Organization         Address  Phone  Notes  [facility]  [phone]- [phone]   [facility]  [phone]- [phone]   [facility] [address], [place] [phone] or  [phone]    Mobile Crisis Teams Organization         Address  Phone  Notes  [facility], Mobile Crisis Care Unit  [phone]   [facility]  [address] [place], [place] [phone]   Doristine LocksSharon DeEsch [address] [place] [phone]    Self-Help/Support Groups Organization         Address  Phone             Notes  [facility]oc[facility] of [place] - variety of support groups  336- [phone] Call for more information  Narcotics Anonymous (NA), [facility] [address], [place]  2 meetings at this location   Statisticianesidential Treatment Programs Organization         Address  Phone  Notes  [facility] [address],    [place]  [phone]   [facility]  [address], [place] [phone], [place], [place] [phone]   [facility] [address], [place] [phone] Admissions: 8am-3pm M-F  [facility] [address]    [place], [place] [phone]   [facility] [address], [place], [place] [phone]   [facility] [address]  [place], [place] [phone]   [facility] - Intensive Outpatient [address], [place], [place] [phone]   [facility] ([facility] [address]  [place], [place] [phone] or [phone]   [facility] ([facility]) [address] [place], [place] [phone] Accepts Medicaid  [facility] [address]  [place] [phone] Substance Abuse/Addiction Treatment   [facility] Behavioral Health Resources Organization         Address  Phone  Notes  [facility]  [phone]) [phone]   Angie FavaJulie Brannon, PhD [address] [place], [place]   [phone]) [phone] or [phone]  [facility]   [address] [place], [place] [phone]   [address],  [place], [place] [phone] Insurance/Medicaid/sponsorship through [facility] and Families [address], [address] [place], [place] [phone] [place], [place] [phone]    Dr. Adele Schilder  [phone]   [facility] Dept. 1) [address], [place] 2) [place] 3)  [address], [place] [phone]  [phone]   [place] [phone] or [phone] (After Hours)

## 2014-12-29 NOTE — ED Notes (Signed)
Nurse is getting blood 

## 2015-01-16 ENCOUNTER — Ambulatory Visit (INDEPENDENT_AMBULATORY_CARE_PROVIDER_SITE_OTHER): Payer: Medicare PPO | Admitting: Obstetrics and Gynecology

## 2015-01-16 ENCOUNTER — Encounter: Payer: Self-pay | Admitting: Obstetrics and Gynecology

## 2015-01-16 VITALS — BP 121/81 | HR 67 | Temp 98.3°F | Ht 65.0 in | Wt 165.4 lb

## 2015-01-16 DIAGNOSIS — N83202 Unspecified ovarian cyst, left side: Secondary | ICD-10-CM

## 2015-01-16 DIAGNOSIS — N8320 Unspecified ovarian cysts: Secondary | ICD-10-CM

## 2015-01-16 MED ORDER — IBUPROFEN 600 MG PO TABS: 600.0000 mg | ORAL_TABLET | Freq: Four times a day (QID) | ORAL | Status: DC | PRN

## 2015-01-16 NOTE — Progress Notes (Signed)
Patient ID: Kathleen Solis, female   DOB: 09-15-1965, 49 y.o.   MRN: [id number] 49 yo here as an ED follow up for the evaluation of a left ovarian cyst. Patient reports persistent pain since her ED visit. It has improved but is still Norita Meigs, localized in her LLQ radiating to her lower back. Patient reports some relief with ibuprofen. She was given a Rx norco but has not filled it yet. Patient describes the pain similar to her diverticulitis pain  Past Medical History  Diagnosis Date  . Hypertension   . Stroke   . Diverticulitis    Past Surgical History  Procedure Laterality Date  . Appendectomy    . Cholecystectomy    . Abdominal hysterectomy      partial  . Anal fissure repair    . Tubal ligation     No family history on file. History  Substance Use Topics  . Smoking status: Never Smoker   . Smokeless tobacco: Not on file  . Alcohol Use: No   ROS See pertinent in HPI  GENERAL: Well-developed, well-nourished female in no acute distress.  ABDOMEN: Soft, nontender, nondistended. No organomegaly. PELVIC: Normal external female genitalia. Vagina is pink and rugated.  Normal discharge. Normal appearing cervix. Uterus is normal in size. No adnexal mass or tenderness. EXTREMITIES: No cyanosis, clubbing, or edema, 2+ distal pulses.   5/9 Ultrasound FINDINGS: Uterus  Surgically absent  Endometrium  N/A  Right ovary  Measurements: 2.2 x 1.3 x 1.9 cm. Suboptimally visualized due to bowel gas. No gross evidence of mass or cyst.  Left ovary  Measurements: 6.8 x 2.9 x 5.2 cm. Complex cyst with partial septation LEFT ovary 4.0 x 2.7 x 4.1 cm. Blood flow present within LEFT ovary on color Doppler imaging.  Other findings  No free pelvic fluid or additional adnexal masses.  IMPRESSION: Post hysterectomy.  Grossly unremarkable RIGHT ovary.  Complex cystic lesion within LEFT ovary 4.0 x 2.7 x 4.1 cm in size containing a partial septation.  Followup  imaging recommended in 6-12 weeks in order to reassess this cystic lesion, in order to exclude cystic tumor.  A/P 49 yo with a complex left ovarian cyst - ultrasound results reviewed and explained to the patient - Will repeat pelvic ultrasound in July - Discussed surgical intervention with a laparoscopic left oophorectomy in the event of an enlarged/persistent cyst on follow up scan. Risks, benefits of the surgery were reviewed and explained including but not limited to risks of bleeding, infection and damage to adjacent organs. Patient verbalized understanding and all questions were answered - Patient advised to apply a heating pad, take ibuprofen and supplement with Norco as needed - patient will be contacted with ultrasound results and follow up plan

## 2015-02-25 ENCOUNTER — Telehealth: Payer: Self-pay | Admitting: *Deleted

## 2015-02-25 ENCOUNTER — Ambulatory Visit (HOSPITAL_COMMUNITY)
Admission: RE | Admit: 2015-02-25 | Discharge: 2015-02-25 | Disposition: A | Discharge status: Home / Self Care | Payer: Medicare PPO | Source: Ambulatory Visit | Attending: Obstetrics and Gynecology | Admitting: Obstetrics and Gynecology

## 2015-02-25 DIAGNOSIS — R1032 Left lower quadrant pain: Secondary | ICD-10-CM | POA: Diagnosis not present

## 2015-02-25 DIAGNOSIS — Z90710 Acquired absence of both cervix and uterus: Secondary | ICD-10-CM | POA: Diagnosis not present

## 2015-02-25 DIAGNOSIS — N8320 Unspecified ovarian cysts: Secondary | ICD-10-CM | POA: Insufficient documentation

## 2015-02-25 DIAGNOSIS — N83202 Unspecified ovarian cyst, left side: Secondary | ICD-10-CM

## 2015-02-25 NOTE — Telephone Encounter (Signed)
Called Kathleen Solis and notifed her that ovarian cyst is still resolving but has gone from 6.5 to 2.5. She states she is still having pain at times, but pain is less. I advised her she may still have some pain as cyst resolves. May take ibuprofen 600 or vicodin she has as needed. Instructed her if pain worsens needs to be evaluated in mAU or call clinic. She voices understanding.

## 2015-02-25 NOTE — Telephone Encounter (Addendum)
Per note from Dr. Jolayne Pantheronstant- need to call patient and notify her of resolving left ovarian cyst- down from 6.5 to 2.5.

## 2015-03-03 DIAGNOSIS — N83202 Unspecified ovarian cyst, left side: Secondary | ICD-10-CM

## 2015-03-03 DIAGNOSIS — I10 Essential (primary) hypertension: Secondary | ICD-10-CM

## 2015-03-03 DIAGNOSIS — I6930 Unspecified sequelae of cerebral infarction: Secondary | ICD-10-CM | POA: Insufficient documentation

## 2015-03-03 HISTORY — DX: Essential (primary) hypertension: I10

## 2015-03-03 HISTORY — DX: Unspecified ovarian cyst, left side: N83.202

## 2015-06-04 ENCOUNTER — Telehealth: Payer: Self-pay | Admitting: *Deleted

## 2015-06-04 ENCOUNTER — Ambulatory Visit: Payer: Medicare PPO | Admitting: Obstetrics and Gynecology

## 2015-06-04 NOTE — Telephone Encounter (Signed)
Received message left on nurse line 06/04/15 at 1113.  Patient states she would like an appointment.  States she is having pain like she did when she saw Dr. Jolayne Pantheronstant back in July with an ovarian cyst.    Called patient and scheduled for 06/04/15 at 1600.  Patient states she will call us back if she can't make it today.

## 2015-07-02 ENCOUNTER — Ambulatory Visit: Payer: Medicare PPO | Admitting: Obstetrics and Gynecology

## 2015-08-04 IMAGING — CT CT ABD-PELV W/ CM
2 of 5 series · 16 of 46 positions shown, 18 images · IV contrast (OMNIPAQUE 300)
Comparison: 11/24/2014

CLINICAL DATA: Abdominal pain

EXAM:
CT ABDOMEN AND PELVIS WITH CONTRAST
TECHNIQUE: Multidetector CT imaging of the abdomen and pelvis was performed
using the standard protocol following bolus administration of
intravenous contrast.
CONTRAST:  50mL OMNIPAQUE IOHEXOL 300 MG/ML SOLN, 100mL OMNIPAQUE
IOHEXOL 300 MG/ML SOLN

[Series 2: abd/pel with · axial · 0.79mm/px · z∈[+1116,+1516]mm · 13 of 92 slices shown, 15 images]
[im 6/92  soft-tissue]
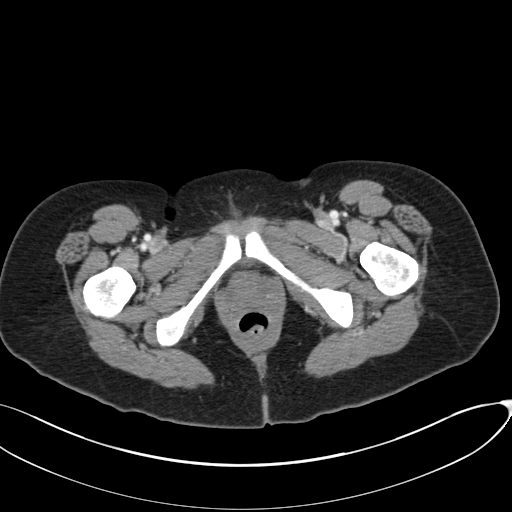
[im 6/92  bone]
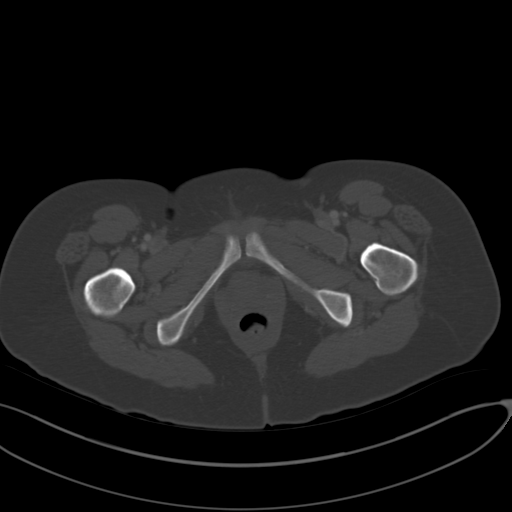
[im 11/92  soft-tissue]
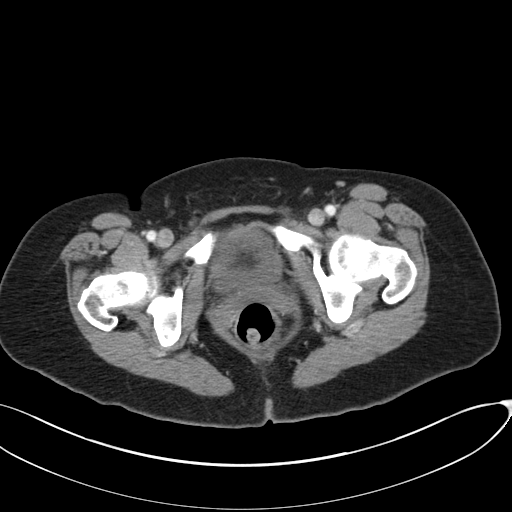
[im 21/92  soft-tissue]
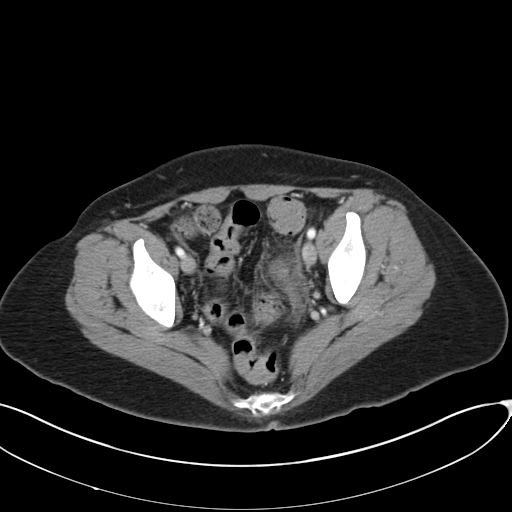
[im 26/92  soft-tissue]
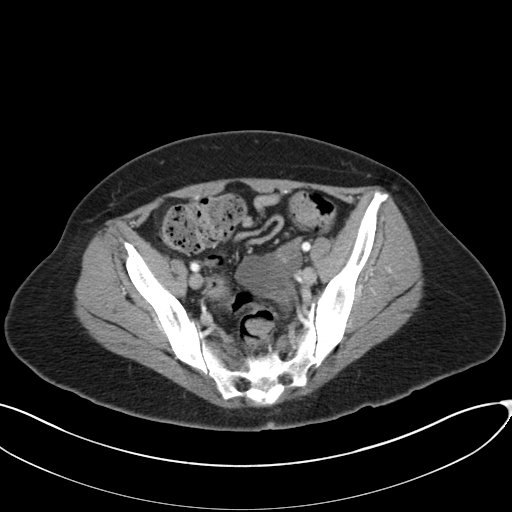
[im 31/92  soft-tissue]
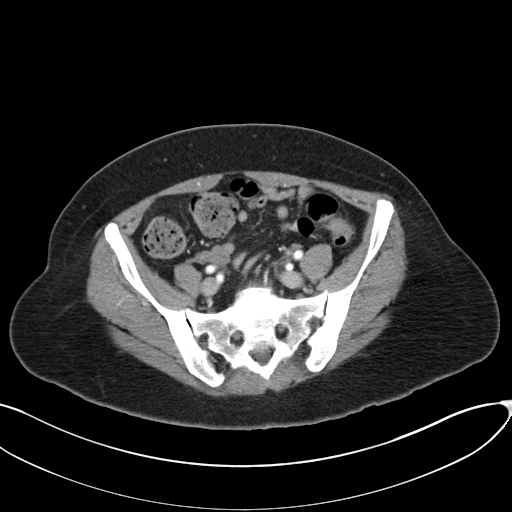
[im 41/92  soft-tissue]
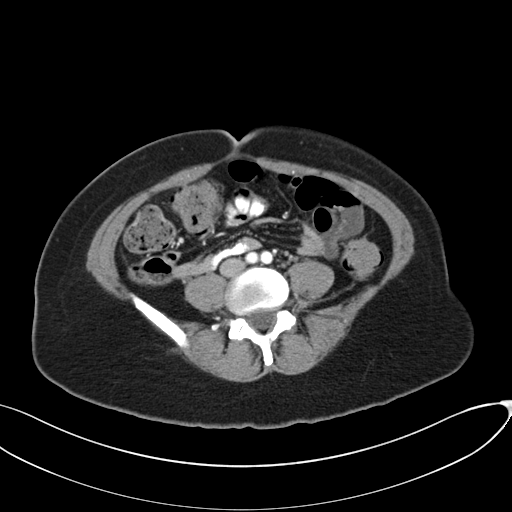
[im 46/92  soft-tissue]
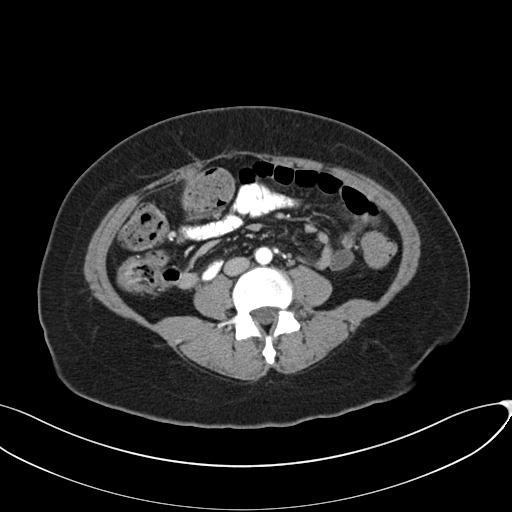
[im 51/92  soft-tissue]
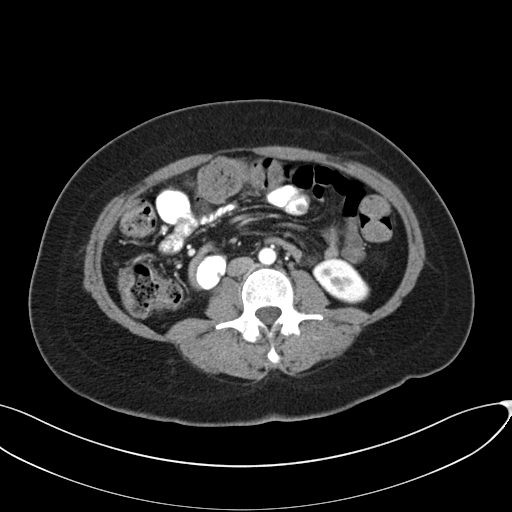
[im 61/92  soft-tissue]
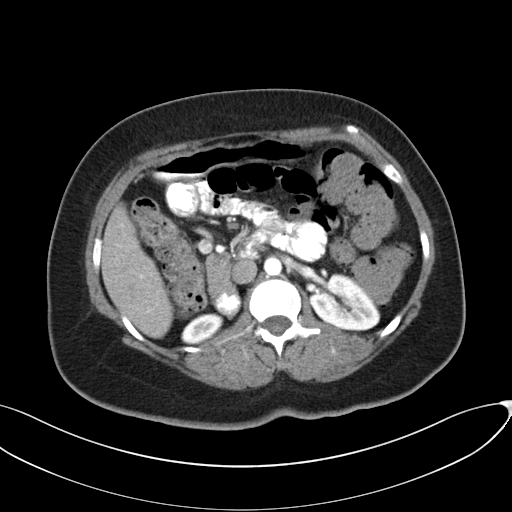
[im 61/92  bone]
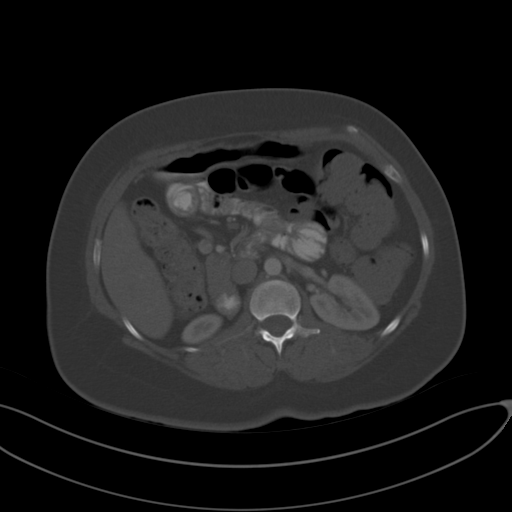
[im 66/92  soft-tissue]
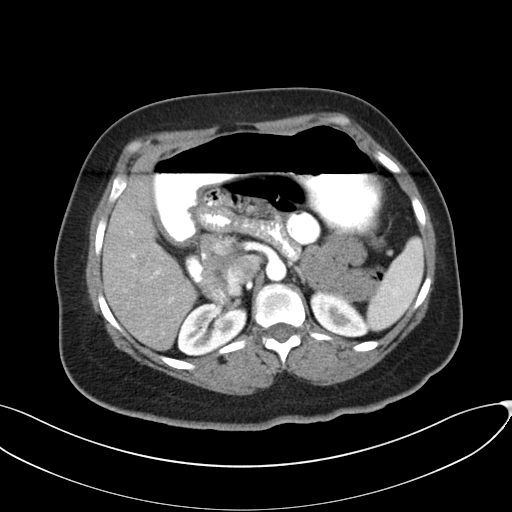
[im 71/92  soft-tissue]
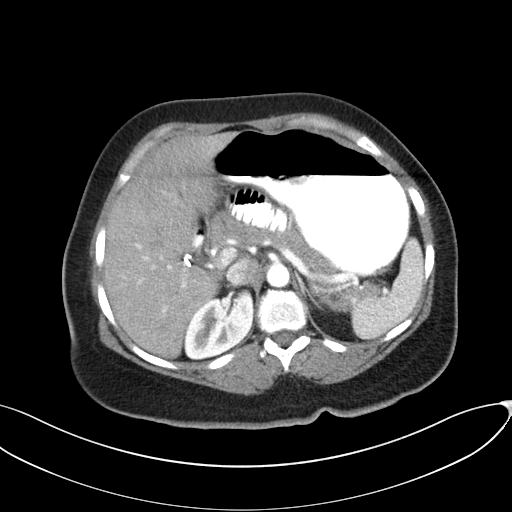
[im 81/92  soft-tissue]
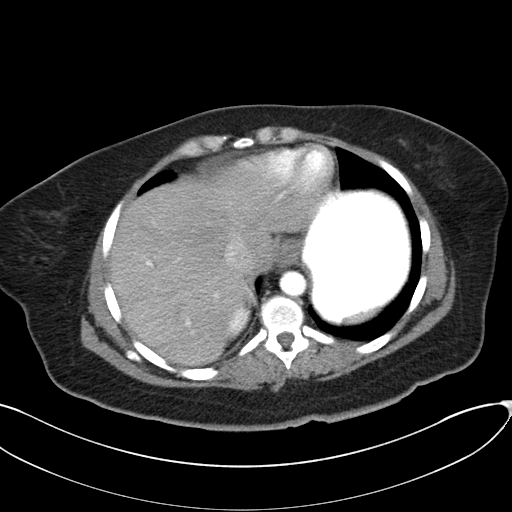
[im 86/92  soft-tissue]
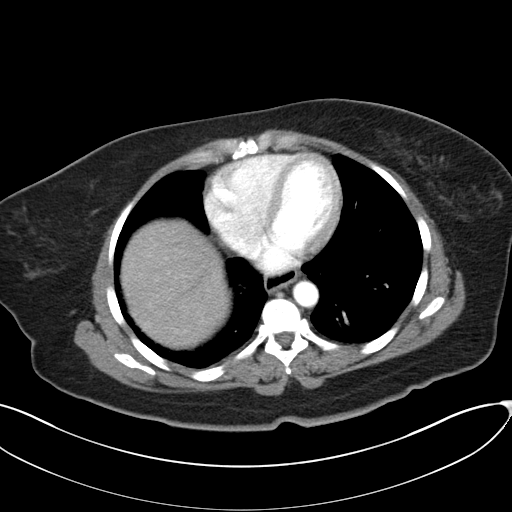

[Series 5: coronal a/|p · coronal · 0.74mm/px · 3 of 92 slices shown]
[im 31/92  soft-tissue]
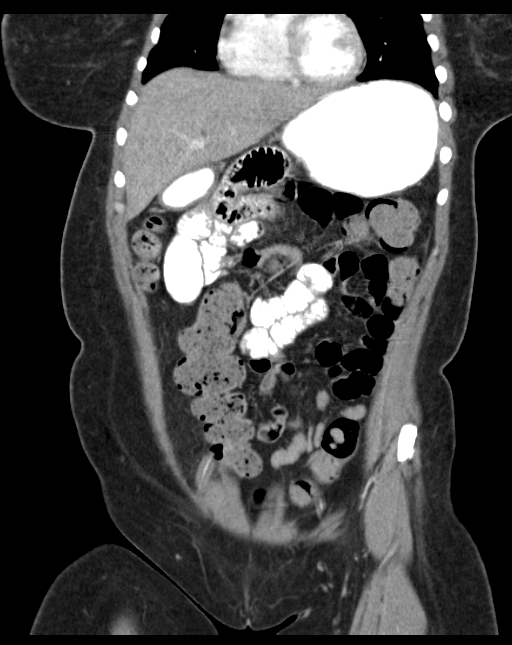
[im 41/92  soft-tissue]
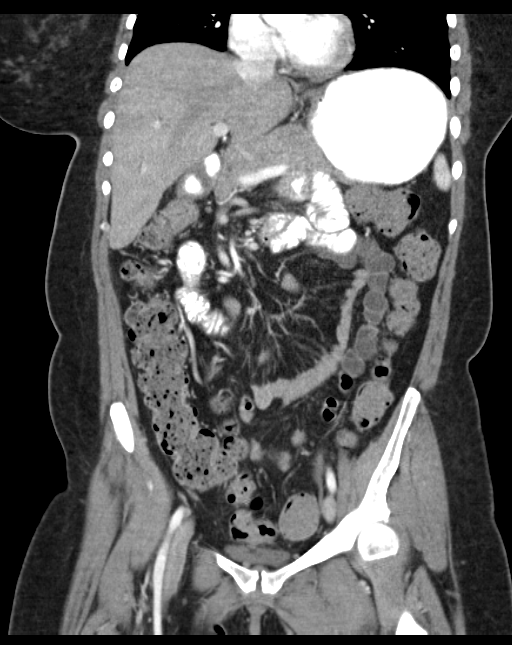
[im 51/92  soft-tissue]
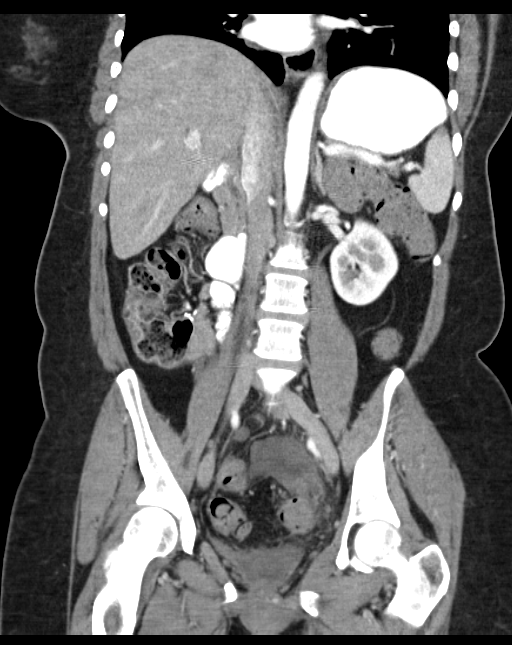

[16 of 46 positions shown; findings below may reference images not displayed]

FINDINGS: Cystic lesion in the left adnexa has enlarged and now measures
greater than 4.5 cm. Previously, it measured with a maximal diameter
of 3.3 cm. There is stranding in the retroperitoneal fat posterior
and inferior to this lesion.

Diffuse hepatic steatosis

Postcholecystectomy

Prominent stool burden throughout the length of the colon.

Spleen, pancreas, adrenal glands, and kidneys are within normal
limits

No abnormal adenopathy by measurement criteria.

No vertebral compression deformity.
IMPRESSION: The cystic abnormality within the left adnexa has enlarged.
Inflammatory changes in the adjacent retroperitoneal fat have also
developed. Ultrasound is recommended to further characterize.
Underlying cystic neoplasm cannot be excluded.

## 2015-08-04 IMAGING — US US TRANSVAGINAL NON-OB
1 series · 13 of 25 positions shown · non-contrast
Comparison: CT abdomen pelvis 12/29/2014

CLINICAL DATA: Abdominal, pelvic, and back pain for 2 days,
abnormal CT with cystic changes LEFT adnexa

EXAM:
TRANSABDOMINAL AND TRANSVAGINAL ULTRASOUND OF PELVIS
TECHNIQUE: Both transabdominal and transvaginal ultrasound examinations of the
pelvis were performed. Transabdominal technique was performed for
global imaging of the pelvis including uterus, ovaries, adnexal
regions, and pelvic cul-de-sac. It was necessary to proceed with
endovaginal exam following the transabdominal exam to visualize the
ovaries. Degradation of image quality secondary to body habitus.

[Series 1: us transvaginal non-ob · 0.21mm/px · 13 of 56 slices shown]
[im 1/56]
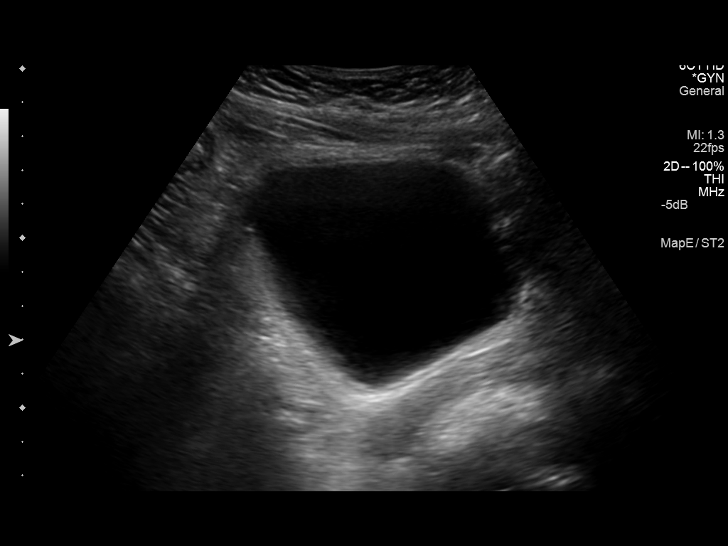
[im 5/56]
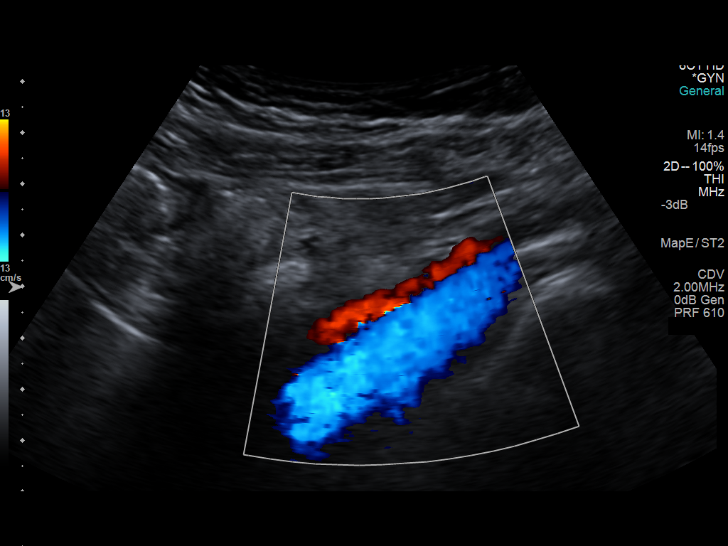
[im 10/56]
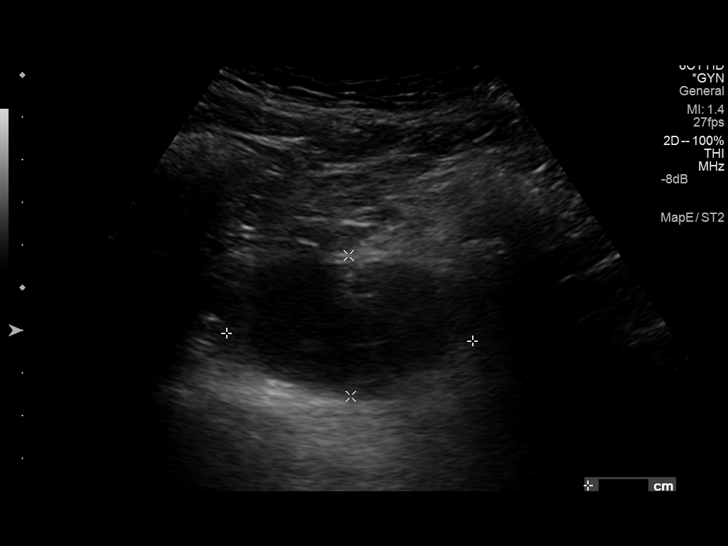
[im 14/56]
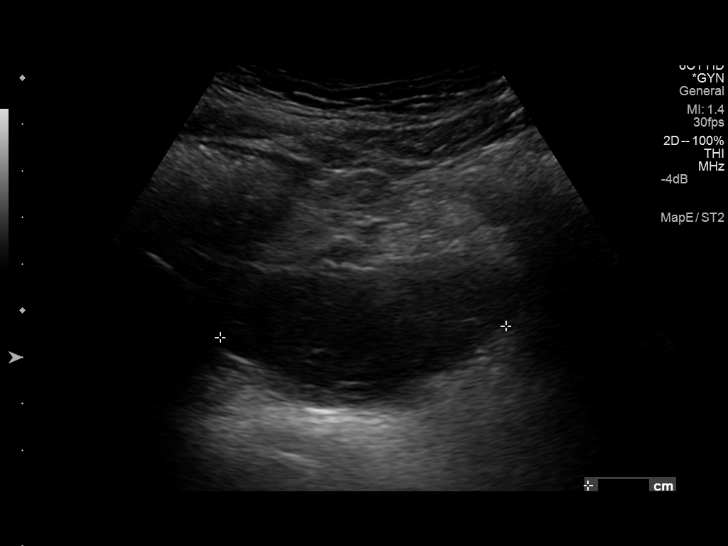
[im 19/56]
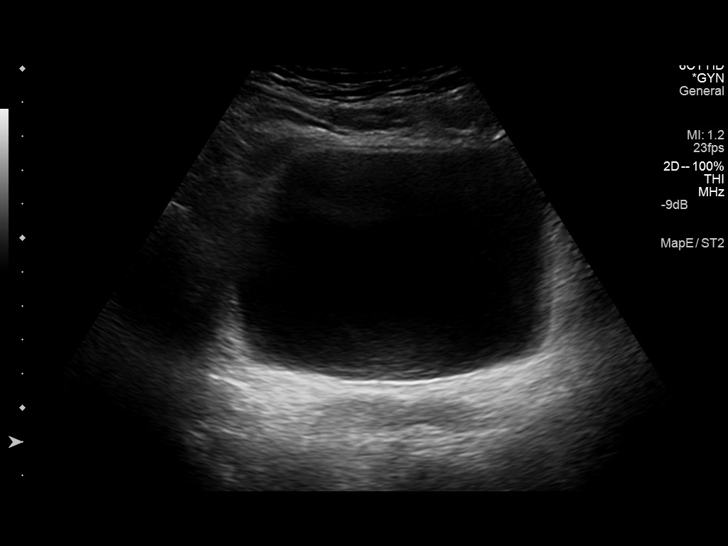
[im 23/56]
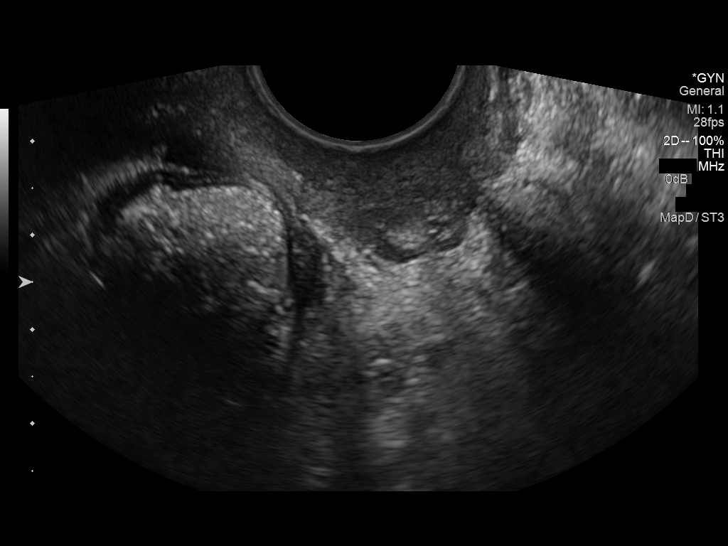
[im 28/56]
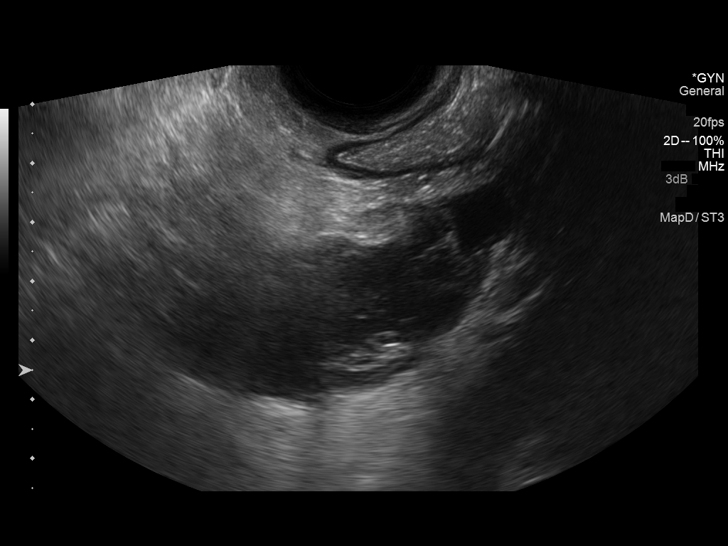
[im 33/56]
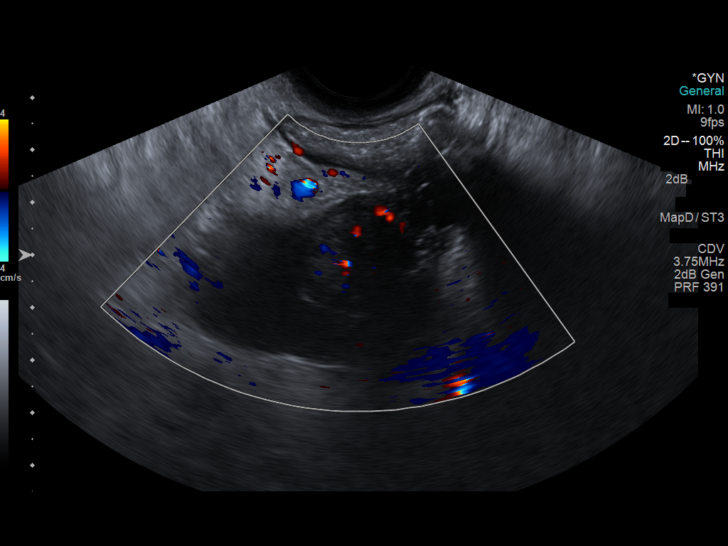
[im 37/56]
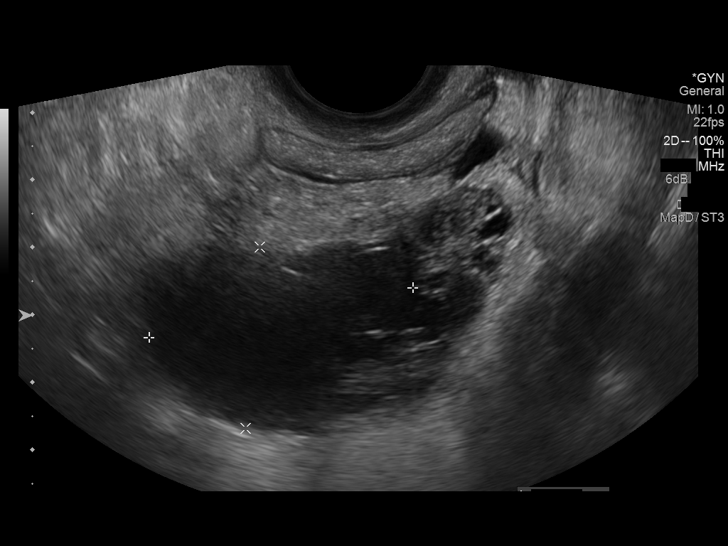
[im 42/56]
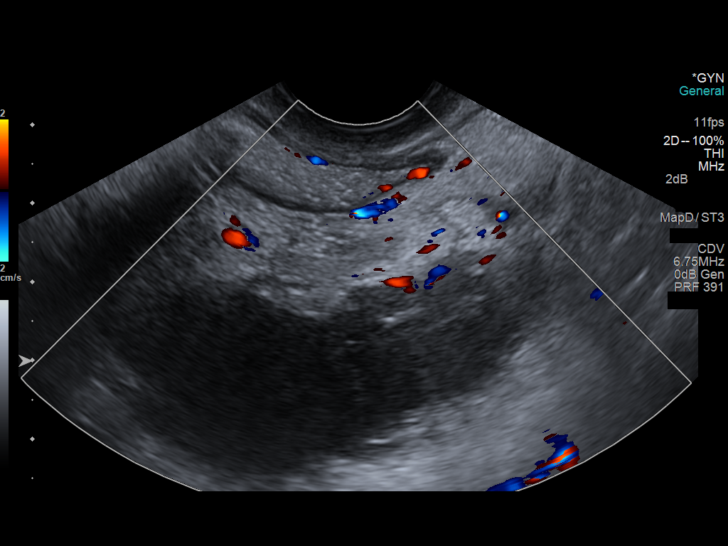
[im 46/56]
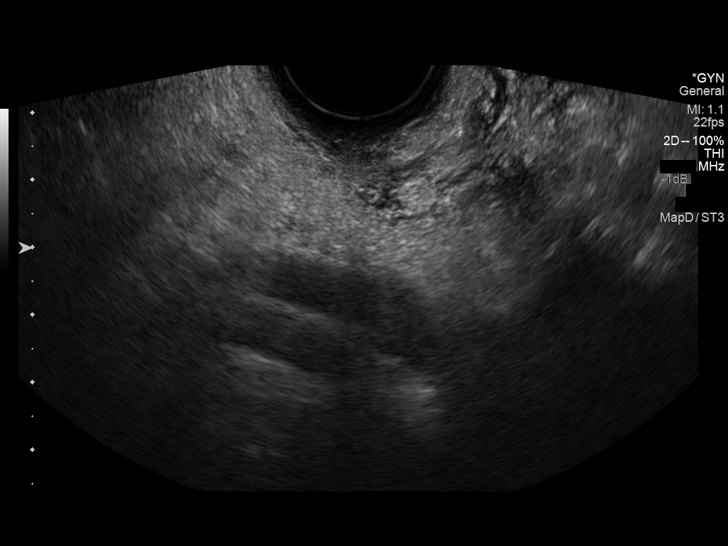
[im 51/56]
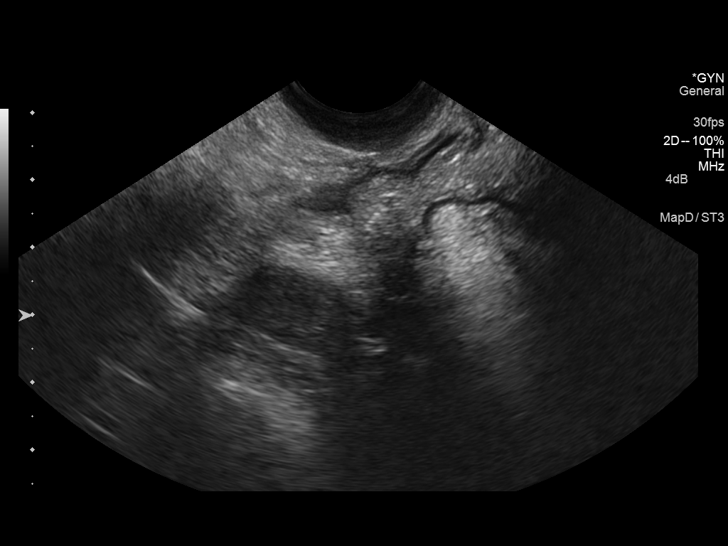
[im 56/56]
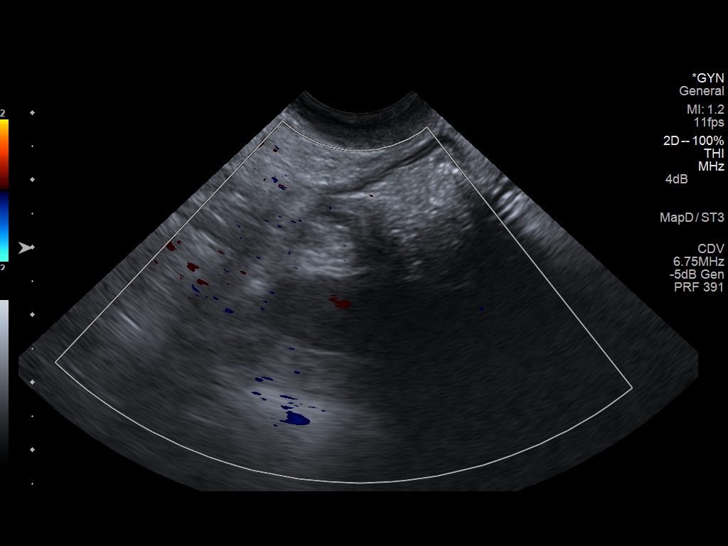

[13 of 25 positions shown; findings below may reference images not displayed]

FINDINGS: Uterus

Surgically absent

Endometrium

N/A

Right ovary

Measurements: 2.2 x 1.3 x 1.9 cm. Suboptimally visualized due to
bowel gas. No gross evidence of mass or cyst.

Left ovary

Measurements: 6.8 x 2.9 x 5.2 cm. Complex cyst with partial
septation LEFT ovary 4.0 x 2.7 x 4.1 cm. Blood flow present within
LEFT ovary on color Doppler imaging.

Other findings

No free pelvic fluid or additional adnexal masses.
IMPRESSION: Post hysterectomy.

Grossly unremarkable RIGHT ovary.

Complex cystic lesion within LEFT ovary 4.0 x 2.7 x 4.1 cm in size
containing a partial septation.

Followup imaging recommended in 6-12 weeks in order to reassess this
cystic lesion, in order to exclude cystic tumor.

## 2015-08-07 ENCOUNTER — Ambulatory Visit: Payer: Medicare PPO | Admitting: Obstetrics and Gynecology

## 2015-11-19 ENCOUNTER — Ambulatory Visit (INDEPENDENT_AMBULATORY_CARE_PROVIDER_SITE_OTHER): Payer: Medicare Other | Admitting: Obstetrics and Gynecology

## 2015-11-19 ENCOUNTER — Encounter: Payer: Self-pay | Admitting: Obstetrics and Gynecology

## 2015-11-19 VITALS — BP 125/78 | HR 62 | Temp 98.7°F | Ht 65.0 in | Wt 168.5 lb

## 2015-11-19 DIAGNOSIS — Z1231 Encounter for screening mammogram for malignant neoplasm of breast: Secondary | ICD-10-CM

## 2015-11-19 DIAGNOSIS — Z01419 Encounter for gynecological examination (general) (routine) without abnormal findings: Secondary | ICD-10-CM

## 2015-11-19 NOTE — Progress Notes (Signed)
  Subjective:     Kathleen Solis is a 50 y.o. female [phone] who is here for a comprehensive physical exam. The patient reports no problems. She is not sexually active. She denies any urinary incontinence. She denies any abnormal bleeding  Past Medical History  Diagnosis Date  . Hypertension   . Stroke (HCC)   . Diverticulitis    Past Surgical History  Procedure Laterality Date  . Appendectomy    . Cholecystectomy    . Abdominal hysterectomy      partial  . Anal fissure repair    . Tubal ligation     Family History  Problem Relation Age of Onset  . Breast cancer Sister     Social History   Social History  . Marital Status: Single    Spouse Name: N/A  . Number of Children: N/A  . Years of Education: N/A   Occupational History  . Not on file.   Social History Main Topics  . Smoking status: Never Smoker   . Smokeless tobacco: Not on file  . Alcohol Use: No  . Drug Use: No  . Sexual Activity: Not on file   Other Topics Concern  . Not on file   Social History Narrative   Health Maintenance  Topic Date Due  . HIV Screening  12/13/1981  . TETANUS/TDAP  12/13/1985  . PAP SMEAR  12/14/1987  . INFLUENZA VACCINE  03/23/2015     Review of Systems Pertinent items are noted in HPI.   Objective:  Blood pressure 125/78, pulse 62, temperature 98.7 F (37.1 C), temperature source Oral, height 5\' 5"  (1.651 m), weight 168 lb 8 oz (76.431 kg).      GENERAL: Well-developed, well-nourished female in no acute distress.  HEENT: Normocephalic, atraumatic. Sclerae anicteric.  NECK: Supple. Normal thyroid.  LUNGS: Clear to auscultation bilaterally.  HEART: Regular rate and rhythm. BREASTS: Symmetric in size. No palpable masses or lymphadenopathy, skin changes, or nipple drainage. ABDOMEN: Soft, nontender, nondistended. No organomegaly. PELVIC: Normal external female genitalia. Vagina is pink and rugated.  Normal discharge. No cervix and no palpable uterus. No adnexal mass  or tenderness. EXTREMITIES: No cyanosis, clubbing, or edema, 2+ distal pulses.    Assessment:    Healthy female exam.      Plan:     Screening mammogram normal within the last 12 months per patient Patient with 2 cm simple left ovarian cyst in 02/2015. Will schedule follow up ultrasound Patient advised to perform monthly self breast and vulva exams Patient will be contacted with any abnormal results RTC prn See After Visit Summary for Counseling Recommendations

## 2015-11-19 NOTE — Progress Notes (Signed)
US Scheduled for April 7th @ 0800.  Pt notified.

## 2015-11-24 ENCOUNTER — Emergency Department (HOSPITAL_COMMUNITY)
Admission: EM | Admit: 2015-11-24 | Discharge: 2015-11-25 | Discharge status: Against Medical Advice | Payer: Medicare Other | Attending: Emergency Medicine | Admitting: Emergency Medicine

## 2015-11-24 ENCOUNTER — Encounter (HOSPITAL_COMMUNITY): Payer: Self-pay

## 2015-11-24 ENCOUNTER — Emergency Department (HOSPITAL_COMMUNITY): Payer: Medicare Other

## 2015-11-24 DIAGNOSIS — R51 Headache: Secondary | ICD-10-CM | POA: Insufficient documentation

## 2015-11-24 DIAGNOSIS — R471 Dysarthria and anarthria: Secondary | ICD-10-CM | POA: Diagnosis not present

## 2015-11-24 DIAGNOSIS — I10 Essential (primary) hypertension: Secondary | ICD-10-CM | POA: Insufficient documentation

## 2015-11-24 DIAGNOSIS — Z79899 Other long term (current) drug therapy: Secondary | ICD-10-CM | POA: Insufficient documentation

## 2015-11-24 DIAGNOSIS — R200 Anesthesia of skin: Secondary | ICD-10-CM

## 2015-11-24 DIAGNOSIS — R519 Headache, unspecified: Secondary | ICD-10-CM

## 2015-11-24 LAB — I-STAT TROPONIN, ED: Troponin i, poc: 0 ng/mL (ref 0.00–0.08)

## 2015-11-24 LAB — CBC
HCT: 35.4 % — ABNORMAL LOW (ref 36.0–46.0)
Hemoglobin: 12.8 g/dL (ref 12.0–15.0)
MCH: 32.3 pg (ref 26.0–34.0)
MCHC: 36.2 g/dL — ABNORMAL HIGH (ref 30.0–36.0)
MCV: 89.4 fL (ref 78.0–100.0)
Platelets: 241 10*3/uL (ref 150–400)
RBC: 3.96 MIL/uL (ref 3.87–5.11)
RDW: 13.5 % (ref 11.5–15.5)
WBC: 8.1 10*3/uL (ref 4.0–10.5)

## 2015-11-24 LAB — APTT: aPTT: 27 seconds (ref 24–37)

## 2015-11-24 LAB — DIFFERENTIAL
Basophils Absolute: 0 10*3/uL (ref 0.0–0.1)
Basophils Relative: 1 %
Eosinophils Absolute: 0.1 10*3/uL (ref 0.0–0.7)
Eosinophils Relative: 2 %
Lymphocytes Relative: 25 %
Lymphs Abs: 2 10*3/uL (ref 0.7–4.0)
Monocytes Absolute: 0.5 10*3/uL (ref 0.1–1.0)
Monocytes Relative: 6 %
Neutro Abs: 5.5 10*3/uL (ref 1.7–7.7)
Neutrophils Relative %: 68 %

## 2015-11-24 LAB — COMPREHENSIVE METABOLIC PANEL
ALT: 15 U/L (ref 14–54)
AST: 21 U/L (ref 15–41)
Albumin: 3.9 g/dL (ref 3.5–5.0)
Alkaline Phosphatase: 92 U/L (ref 38–126)
Anion gap: 8 (ref 5–15)
BUN: 15 mg/dL (ref 6–20)
CO2: 31 mmol/L (ref 22–32)
Calcium: 9.3 mg/dL (ref 8.9–10.3)
Chloride: 101 mmol/L (ref 101–111)
Creatinine, Ser: 0.79 mg/dL (ref 0.44–1.00)
GFR calc Af Amer: >60 mL/min (ref >60)
GFR calc non Af Amer: >60 mL/min (ref >60)
Glucose, Bld: 108 mg/dL — ABNORMAL HIGH (ref 65–99)
Potassium: 3.9 mmol/L (ref 3.5–5.1)
Sodium: 140 mmol/L (ref 135–145)
Total Bilirubin: 0.8 mg/dL (ref 0.3–1.2)
Total Protein: 7.3 g/dL (ref 6.5–8.1)

## 2015-11-24 LAB — I-STAT CHEM 8, ED
BUN: 16 mg/dL (ref 6–20)
Calcium, Ion: 1.17 mmol/L (ref 1.12–1.23)
Chloride: 97 mmol/L — ABNORMAL LOW (ref 101–111)
Creatinine, Ser: 0.9 mg/dL (ref 0.44–1.00)
Glucose, Bld: 106 mg/dL — ABNORMAL HIGH (ref 65–99)
HCT: 41 % (ref 36.0–46.0)
Hemoglobin: 13.9 g/dL (ref 12.0–15.0)
Potassium: 3.4 mmol/L — ABNORMAL LOW (ref 3.5–5.1)
Sodium: 140 mmol/L (ref 135–145)
TCO2: 30 mmol/L (ref 0–100)

## 2015-11-24 LAB — PROTIME-INR
INR: 1.18 (ref 0.00–1.49)
Prothrombin Time: 14.8 seconds (ref 11.6–15.2)

## 2015-11-24 LAB — CBG MONITORING, ED: Glucose-Capillary: 111 mg/dL — ABNORMAL HIGH (ref 65–99)

## 2015-11-24 NOTE — ED Notes (Signed)
[facility] arrived  

## 2015-11-24 NOTE — ED Notes (Signed)
Pt transported to CT ?

## 2015-11-24 NOTE — ED Notes (Signed)
Pt complains of a headache

## 2015-11-24 NOTE — ED Notes (Signed)
MD at bedside. 

## 2015-11-24 NOTE — ED Notes (Addendum)
Pt endorsing R sided numbness and headache. Hx stroke

## 2015-11-24 NOTE — ED Notes (Signed)
Pt complains of right sided numbness from her head down through her leg that started about 20 minutes ago, hx of stroke in 2008

## 2015-11-24 NOTE — ED Provider Notes (Signed)
CSN: [id number]     Arrival date & time 11/24/15  2151 History   By signing my name below, I, Arlan Organshley Leger, attest that this documentation has been prepared under the direction and in the presence of Mancel BaleElliott Nyeisha Goodall, MD.  Electronically Signed: Arlan OrganAshley Leger, ED Scribe. 11/24/2015. 11:25 PM.   Chief Complaint  Patient presents with  . Numbness   The history is provided by the patient. No language interpreter was used.    HPI Comments: Kathleen Solis is a 50 y.o. female with a PMHx of HTN and stroke who presents to the Emergency Department complaining of constant, ongoing R sided HA and numbness onset 9:00 PM this evening. Pt also reports "trouble walking". However, pt states she was able to get into the car without significant difficulty this evening. No aggravating or alleviating factors reported. No recent fever, chills, blurred vision, shortness of breath, or chest pain. Pt admits to a history of similar symptoms several years ago when she had a stroke. She is unable to specify a prior long term disability. Last follow up with neurologist approximately 2 years ago.  PCP: Rosario AdieBeck, Eric W, MD    Past Medical History  Diagnosis Date  . Hypertension   . Stroke (HCC)   . Diverticulitis    Past Surgical History  Procedure Laterality Date  . Appendectomy    . Cholecystectomy    . Abdominal hysterectomy      partial  . Anal fissure repair    . Tubal ligation     Family History  Problem Relation Age of Onset  . Breast cancer Sister    Social History  Substance Use Topics  . Smoking status: Never Smoker   . Smokeless tobacco: None  . Alcohol Use: No   OB History    Gravida Para Term Preterm AB TAB SAB Ectopic Multiple Living   4 2 2  2  2   2      Review of Systems  Constitutional: Negative for fever and chills.  Eyes: Negative for visual disturbance.  Respiratory: Negative for shortness of breath.   Cardiovascular: Negative for chest pain.  Neurological: Positive for numbness  and headaches.  All other systems reviewed and are negative.     Allergies  Compazine; Morphine and related; Percocet; and Sulfa antibiotics  Home Medications   Prior to Admission medications   Medication Sig Start Date End Date Taking? Authorizing Provider  hydrochlorothiazide (HYDRODIURIL) 25 MG tablet Take 25 mg by mouth daily.     Yes Historical Provider, MD  ibuprofen (ADVIL,MOTRIN) 200 MG tablet Take 400 mg by mouth every 6 (six) hours as needed for mild pain.   Yes Historical Provider, MD  metoprolol (LOPRESSOR) 100 MG tablet Take 100 mg by mouth daily.   Yes Historical Provider, MD  HYDROcodone-acetaminophen (NORCO/VICODIN) 5-325 MG per tablet Take 1-2 tablets by mouth every 6 hours as needed for pain and/or cough. Patient not taking: Reported on 01/16/2015 12/29/14   Joni ReiningNicole Pisciotta, PA-C  ibuprofen (ADVIL,MOTRIN) 600 MG tablet Take 1 tablet (600 mg total) by mouth every 6 (six) hours as needed. Patient not taking: Reported on 11/24/2015 01/16/15   Catalina AntiguaPeggy Constant, MD   Triage Vitals: BP 105/93 mmHg  Pulse 65  Temp(Src) 98.1 F (36.7 C) (Oral)  Resp 16  SpO2 95%   Physical Exam  Constitutional: She is oriented to person, place, and time. She appears well-developed and well-nourished.  HENT:  Head: Normocephalic and atraumatic.  Eyes: Conjunctivae and EOM are normal. Pupils  are equal, round, and reactive to light.  Neck: Normal range of motion and phonation normal. Neck supple.  Cardiovascular: Normal rate and regular rhythm.   Pulmonary/Chest: Effort normal and breath sounds normal. She exhibits no tenderness.  Abdominal: Soft. She exhibits no distension. There is no tenderness. There is no guarding.  Musculoskeletal: Normal range of motion.  Neurological: She is alert and oriented to person, place, and time. She exhibits normal muscle tone.  Dysesthesia of R arm, R leg, and R face. Mild dysarthria. No aphasia or nystagmus.  Skin: Skin is warm and dry.  Psychiatric: She  has a normal mood and affect. Her behavior is normal. Judgment and thought content normal.  Nursing note and vitals reviewed.   ED Course  Procedures (including critical care time)  Initial Clinical Impression- Possible acute CVA, she is within window for TPA. Low NIH score. Urgent MR required. TPA decision per [facility]Hospitalist at [facility].  DIAGNOSTIC STUDIES: Oxygen Saturation is 95% on RA, adequate by my interpretation.    COORDINATION OF CARE: 11:15 PM- Will order blood work, EKG, and imaging. Will transfer pt to [facility] for an MRI. Discussed treatment plan with pt at bedside and pt agreed to plan.     Case discussed with NeuroHospitalist, who will see patient if needed at [facility] ED.  Transferred to Dr. Blinda Leatherwood for MR imaging urgently   Labs Review Labs Reviewed  CBC - Abnormal; Notable for the following:    HCT 35.4 (*)    MCHC 36.2 (*)    All other components within normal limits  CBG MONITORING, ED - Abnormal; Notable for the following:    Glucose-Capillary 111 (*)    All other components within normal limits  I-STAT CHEM 8, ED - Abnormal; Notable for the following:    Potassium 3.4 (*)    Chloride 97 (*)    Glucose, Bld 106 (*)    All other components within normal limits  PROTIME-INR  APTT  DIFFERENTIAL  COMPREHENSIVE METABOLIC PANEL  I-STAT TROPOININ, ED    Imaging Review Ct Head Wo Contrast  11/24/2015  CLINICAL DATA:  Right-sided paresthesias, onset 20 minutes ago. EXAM: CT HEAD WITHOUT CONTRAST TECHNIQUE: Contiguous axial images were obtained from the base of the skull through the vertex without intravenous contrast. COMPARISON:  None. FINDINGS: There is no intracranial hemorrhage, mass or evidence of acute infarction. There is no extra-axial fluid collection. Gray matter and white matter appear normal. Cerebral volume is normal for age. Brainstem and posterior fossa are unremarkable. The CSF spaces appear normal. The bony structures are intact. The visible portions of  the paranasal sinuses are clear. IMPRESSION: Normal brain Electronically Signed   By: Ellery Plunk M.D.   On: 11/24/2015 22:34   I have personally reviewed and evaluated these images and lab results as part of my medical decision-making.   EKG Interpretation None      MDM   Final diagnoses:  None   Diagnoses that have been ruled out:  None  Diagnoses that are still under consideration:  None  Final diagnoses:  Headache, unspecified headache type  Numbness    Acute HA with paresthesias and dysarthria. Hx CVA left brain. Initial CT negative.   Nursing Notes Reviewed/ Care Coordinated Applicable Imaging Reviewed Interpretation of Laboratory Data incorporated into ED treatment  Plan- Transfer to [facility] ED  I personally performed the services described in this documentation, which was scribed in my presence. The recorded information has been reviewed and is accurate.  Mancel Bale, MD 11/25/15 [phone]

## 2015-11-25 NOTE — ED Provider Notes (Signed)
Patient seen and evaluated. Discussed with Dr. Effie ShyWentz.  Upon her arrival patient states "can I just have this done another time, and go home?"  I explained to her that the whole idea was to try to determine if she had an acute neurological event, i.e. a stroke tonight. We discussed initiating some treatment for a possible immediate paretic migraine, such as Reglan/Benadryl. She declines. She states "I don't want to be here all night". Her speech is clear, and her neurological exam is normal. For the time being she has agreed to stay. MRI is ordered.  Within 1 hour of patient's arrival, MRI tech is here to take her for her MRI. She states she does not want to have it done. I discussed with the risk of worsening stroke, permanent disability, and death. She states she will not stay. Vascular to take aspirin daily and follow-up with her primary care physician for follow-up testing.   Kathleen PorterMark Holmes Hays, MD 11/25/15 [phone]

## 2015-11-25 NOTE — Discharge Instructions (Signed)
You are leaving the ER against the advice of Dr. Fayrene FearingJames and Dr. Effie ShyWentz.  We cannot determine if you have had a stroke.    Also, we cannot determine  Your risk for future, more devastating strokes or death.  Take 325mg  aspirin daily.

## 2015-11-25 NOTE — ED Notes (Signed)
[facility] here for MRI due to right sided arm weakness, NIH was a 2 [facility].  C/o of slight nausea, is slightly hypertensive - took meds for HNT @ 9pm.  Son is on the way.

## 2015-11-25 NOTE — ED Notes (Signed)
Pt insistent on leaving and not having an MRI done tonight. States she would prefer to discuss it with her family doctor. Educated the pt on the risks of leaving without further diagnostic procedures. Pt  Verbalized understanding, and continues to want to leave. Dr. Fayrene FearingJames aware.

## 2015-11-27 ENCOUNTER — Ambulatory Visit (HOSPITAL_COMMUNITY): Payer: Medicare Other

## 2016-01-12 DIAGNOSIS — G44219 Episodic tension-type headache, not intractable: Secondary | ICD-10-CM

## 2016-01-12 DIAGNOSIS — I639 Cerebral infarction, unspecified: Secondary | ICD-10-CM | POA: Insufficient documentation

## 2016-01-12 HISTORY — DX: Episodic tension-type headache, not intractable: G44.219

## 2016-02-08 ENCOUNTER — Encounter: Payer: Self-pay | Admitting: Internal Medicine

## 2016-02-08 ENCOUNTER — Ambulatory Visit (INDEPENDENT_AMBULATORY_CARE_PROVIDER_SITE_OTHER): Payer: Medicare Other | Admitting: Internal Medicine

## 2016-02-08 VITALS — BP 140/90 | HR 76 | Temp 97.9°F | Resp 16 | Ht 65.5 in | Wt 173.0 lb

## 2016-02-08 DIAGNOSIS — I6930 Unspecified sequelae of cerebral infarction: Secondary | ICD-10-CM | POA: Insufficient documentation

## 2016-02-08 DIAGNOSIS — M792 Neuralgia and neuritis, unspecified: Secondary | ICD-10-CM | POA: Insufficient documentation

## 2016-02-08 DIAGNOSIS — M7751 Other enthesopathy of right foot: Secondary | ICD-10-CM

## 2016-02-08 DIAGNOSIS — Z8673 Personal history of transient ischemic attack (TIA), and cerebral infarction without residual deficits: Secondary | ICD-10-CM | POA: Insufficient documentation

## 2016-02-08 DIAGNOSIS — I639 Cerebral infarction, unspecified: Secondary | ICD-10-CM | POA: Insufficient documentation

## 2016-02-08 DIAGNOSIS — I10 Essential (primary) hypertension: Secondary | ICD-10-CM | POA: Diagnosis not present

## 2016-02-08 DIAGNOSIS — G44211 Episodic tension-type headache, intractable: Secondary | ICD-10-CM

## 2016-02-08 DIAGNOSIS — G5792 Unspecified mononeuropathy of left lower limb: Secondary | ICD-10-CM

## 2016-02-08 DIAGNOSIS — M21612 Bunion of left foot: Secondary | ICD-10-CM | POA: Diagnosis not present

## 2016-02-08 DIAGNOSIS — M779 Enthesopathy, unspecified: Secondary | ICD-10-CM

## 2016-02-08 HISTORY — DX: Other enthesopathy of right foot and ankle: M77.51

## 2016-02-08 HISTORY — DX: Bunion of left foot: M21.612

## 2016-02-08 MED ORDER — METOPROLOL SUCCINATE ER 100 MG PO TB24: 100.0000 mg | ORAL_TABLET | Freq: Every day | ORAL | Status: DC

## 2016-02-08 MED ORDER — ASPIRIN EC 81 MG PO TBEC: 81.0000 mg | DELAYED_RELEASE_TABLET | Freq: Every day | ORAL | Status: DC

## 2016-02-08 MED ORDER — HYDROCHLOROTHIAZIDE 25 MG PO TABS: 25.0000 mg | ORAL_TABLET | Freq: Every day | ORAL | Status: DC

## 2016-02-08 NOTE — Patient Instructions (Signed)
   All other Health Maintenance issues reviewed.   All recommended immunizations and age-appropriate screenings are up-to-date or discussed.  No immunizations administered today.   Medications reviewed and updated.  /  No changes recommended at this time.  Your prescription(s) have been submitted to your pharmacy. Please take as directed and contact our office if you believe you are having problem(s) with the medication(s).   Please followup in 6 months   

## 2016-02-08 NOTE — Progress Notes (Signed)
Subjective:    Patient ID: Kathleen Solis, female    DOB: [birth year], 50 y.o.   MRN: [id number]  HPI She is here to establish with a new pcp.    Hypertension: She is taking her medication daily. She is compliant with a low sodium diet.  She denies chest pain, palpitations, edema, shortness of breath. She is exercising regularly.  She does not monitor her blood pressure at home.    CVA, 2007, TIA's:  She has residual memory difficulties, RUE an RLE pain and tingling, right hand hand weakness, speech difficulties and leg numbness.  She had a recent MRI.  She follows up with neuro this week.  She is concerned about her leg pain which is from her stroke.    Headaches:  She has had headaches for a while.  They are daily.  She takes motrin twice daily.  [facility] is [facility] and just had an MRI done.  She follow up with her this week.   Medications and allergies reviewed with patient and updated if appropriate.  There are no active problems to display for this patient.   Current Outpatient Prescriptions on File Prior to Visit  Medication Sig Dispense Refill  . hydrochlorothiazide (HYDRODIURIL) 25 MG tablet Take 25 mg by mouth daily.      Marland Kitchen. ibuprofen (ADVIL,MOTRIN) 200 MG tablet Take 400 mg by mouth every 6 (six) hours as needed for mild pain.    . metoprolol (LOPRESSOR) 100 MG tablet Take 100 mg by mouth daily.     No current facility-administered medications on file prior to visit.    Past Medical History  Diagnosis Date  . Hypertension   . Stroke (HCC)   . Diverticulitis     Past Surgical History  Procedure Laterality Date  . Appendectomy    . Cholecystectomy    . Abdominal hysterectomy      partial  . Anal fissure repair    . Tubal ligation      Social History   Social History  . Marital Status: Single    Spouse Name: N/A  . Number of Children: N/A  . Years of Education: N/A   Social History Main Topics  . Smoking status: Never Smoker   .  Smokeless tobacco: None  . Alcohol Use: No  . Drug Use: No  . Sexual Activity: Not Asked   Other Topics Concern  . None   Social History Narrative    Family History  Problem Relation Age of Onset  . Breast cancer Sister     Review of Systems  Constitutional: Negative for fever, chills, appetite change and unexpected weight change.  Eyes: Negative for visual disturbance.  Respiratory: Negative for cough, shortness of breath and wheezing.   Cardiovascular: Negative for chest pain, palpitations and leg swelling.  Gastrointestinal: Positive for constipation. Negative for nausea, abdominal pain and diarrhea.       No gerd  Genitourinary: Negative for dysuria and hematuria.  Neurological: Positive for weakness (R hand), numbness and headaches. Negative for light-headedness.  Psychiatric/Behavioral: Negative for dysphoric mood. The patient is not nervous/anxious.        Objective:   Filed Vitals:   02/08/16 1258  BP: 140/90  Pulse: 76  Temp: 97.9 F (36.6 C)  Resp: 16   Filed Weights   02/08/16 1258  Weight: 173 lb (78.472 kg)   Body mass index is 28.34 kg/(m^2).   Physical Exam Constitutional: She appears well-developed and well-nourished. No  distress.  HENT:  Head: Normocephalic and atraumatic.  Right Ear: External ear normal. Normal ear canal and TM Left Ear: External ear normal.  Normal ear canal and TM Mouth/Throat: Oropharynx is clear and moist.   Neck: Neck supple. No tracheal deviation present. No thyromegaly present.  No carotid bruit  Cardiovascular: Normal rate, regular rhythm and normal heart sounds.   No murmur heard.  No edema. Pulmonary/Chest: Effort normal and breath sounds normal. No respiratory distress. She has no wheezes. She has no rales.  Abdominal: Soft. She exhibits no distension. There is no tenderness.  Lymphadenopathy: She has no cervical adenopathy.  Skin: Skin is warm and dry. She is not diaphoretic.  Neuro: altered sensation right arm  and leg, slight weakness right hand Psychiatric: She has a normal mood and affect. Her behavior is normal.         Assessment & Plan:   See Problem List for Assessment and Plan of chronic medical problems.   F/u in 6 months

## 2016-02-08 NOTE — Assessment & Plan Note (Signed)
Daily Taking motrin 200 mg bid Will follow up with neuro this week Had an MRI recently

## 2016-02-08 NOTE — Assessment & Plan Note (Signed)
BP typically well controlled - recently 120's/60's Will continue current medication Follow up in 6 months

## 2016-02-08 NOTE — Progress Notes (Signed)
Pre visit review using our clinic review tool, if applicable. No additional management support is needed unless otherwise documented below in the visit note. 

## 2016-02-08 NOTE — Assessment & Plan Note (Signed)
Recent MRI - reviewed Sees neuro this week Continue asa Stressed good BP control Not on a statin - last LDL 124 - discussed statin - she will discuss with neuro

## 2016-02-08 NOTE — Assessment & Plan Note (Signed)
Has follow up with neuro this week

## 2016-02-17 ENCOUNTER — Telehealth: Payer: Self-pay | Admitting: Internal Medicine

## 2016-02-17 NOTE — Telephone Encounter (Signed)
[facility] to Dr.Burns

## 2016-02-25 ENCOUNTER — Encounter: Payer: Self-pay | Admitting: Internal Medicine

## 2016-02-25 DIAGNOSIS — K5732 Diverticulitis of large intestine without perforation or abscess without bleeding: Secondary | ICD-10-CM

## 2016-02-25 HISTORY — DX: Diverticulitis of large intestine without perforation or abscess without bleeding: K57.32

## 2016-04-10 ENCOUNTER — Encounter: Payer: Self-pay | Admitting: Internal Medicine

## 2016-04-10 DIAGNOSIS — I779 Disorder of arteries and arterioles, unspecified: Secondary | ICD-10-CM | POA: Insufficient documentation

## 2016-04-10 DIAGNOSIS — I739 Peripheral vascular disease, unspecified: Secondary | ICD-10-CM

## 2016-04-10 HISTORY — DX: Disorder of arteries and arterioles, unspecified: I77.9

## 2016-07-25 ENCOUNTER — Ambulatory Visit: Payer: Medicare Other | Admitting: Internal Medicine

## 2016-08-15 NOTE — Progress Notes (Signed)
Subjective:    Patient ID: Kathleen Solis, female    DOB: Sep 24, 1965, 50 y.o.   MRN: [id number]  HPI The patient is here for follow up.  Hypertension: She is taking her medication daily. She is compliant with a low sodium diet.  She denies chest pain, palpitations, edema, shortness of breath and regular headaches. She is not exercising regularly.  She does not monitor her blood pressure at home.    H/o CVA:  She is taking ASA daily.  She is not on a statin.  She does follow with neurology.    Her left ear feels plugged for a while.  She denies pain or changes in her hearing. She does have a mild sore throat and feels it may be related to allergies.   She had bunion surgery on her left foot in June 2017.  It has been stiff and painful since.  She has seen podiatry but not in while.  It hurts and she is taking ibuprofen.  She has difficulty sleeping due to the pain.    Dysphagia:  when she drinks juice or water it feels like it gets stuck or she is chocking.  She denies this sensation with food.   She denies GERD.  She denies difficulty swallowing pills.  It happens occasionally. She denies pain with swallowing.     Medications and allergies reviewed with patient and updated if appropriate.  Patient Active Problem List   Diagnosis Date Noted  . Carotid arterial disease (HCC) 04/10/2016  . Diverticulitis of colon without hemorrhage 02/25/2016  . Bunion of left foot 02/08/2016  . Bone spur of right foot 02/08/2016  . Leg neuralgia, right 02/08/2016  . Episodic tension type headache 01/12/2016  . Benign essential HTN 03/03/2015  . History of cerebrovascular accident with residual deficit 03/03/2015    Current Outpatient Prescriptions on File Prior to Visit  Medication Sig Dispense Refill  . aspirin EC 81 MG tablet Take 1 tablet (81 mg total) by mouth daily.    . hydrochlorothiazide (HYDRODIURIL) 25 MG tablet Take 1 tablet (25 mg total) by mouth daily. 30 tablet 5  . metoprolol  succinate (TOPROL-XL) 100 MG 24 hr tablet Take 1 tablet (100 mg total) by mouth daily. Take with or immediately following a meal. 30 tablet 5   No current facility-administered medications on file prior to visit.     Past Medical History:  Diagnosis Date  . Diverticulitis   . Hypertension   . Stroke [facility](HCC)     Past Surgical History:  Procedure Laterality Date  . ABDOMINAL HYSTERECTOMY     partial  . ANAL FISSURE REPAIR    . APPENDECTOMY    . CHOLECYSTECTOMY    . TUBAL LIGATION      Social History   Social History  . Marital status: Single    Spouse name: N/A  . Number of children: N/A  . Years of education: N/A   Social History Main Topics  . Smoking status: Never Smoker  . Smokeless tobacco: Never Used  . Alcohol use No  . Drug use: No  . Sexual activity: Not Asked   Other Topics Concern  . None   Social History Narrative   No regular exercise    Family History  Problem Relation Age of Onset  . Breast cancer Sister     Review of Systems  Constitutional: Negative for fever.  HENT: Positive for sore throat. Negative for dental problem, ear pain, hearing loss and sinus pressure.  Respiratory: Negative for cough, shortness of breath and wheezing.   Cardiovascular: Negative for chest pain, palpitations and leg swelling.  Neurological: Positive for headaches (occasional). Negative for light-headedness.       Objective:   Vitals:   08/16/16 1115  BP: 112/78  Pulse: 75  Resp: 16  Temp: 97.9 F (36.6 C)   Filed Weights   08/16/16 1115  Weight: 177 lb (80.3 kg)   Body mass index is 29.01 kg/m.   Physical Exam    GENERAL APPEARANCE: Appears stated age, well appearing, NAD EYES: conjunctiva clear, no icterus HEENT: bilateral tympanic membranes and ear canals normal, oropharynx with no erythema, no thyromegaly, trachea midline, no cervical or supraclavicular lymphadenopathy LUNGS: Clear to auscultation without wheeze or crackles, unlabored  breathing, good air entry bilaterally HEART: Normal S1,S2 without murmurs EXTREMITIES: Without clubbing, cyanosis, or edema      Assessment & Plan:    See Problem List for Assessment and Plan of chronic medical problems.

## 2016-08-15 NOTE — Patient Instructions (Addendum)
Have blood work done in 6-8 weeks.  You need to fast for this blood work.   Test(s) ordered today. Your results will be released to MyChart (or called to you) after review, usually within 72hours after test completion. If any changes need to be made, you will be notified at that same time.  All other Health Maintenance issues reviewed.   All recommended immunizations and age-appropriate screenings are up-to-date or discussed.  No immunizations administered today.   Medications reviewed and updated.  Changes include starting a cholesterol medication called atorvastatin.  Meloxicam was sent to your pharmacy - this is for your toe pain.  If you take it do not take ibuprofen while taking this.    Your prescription(s) have been submitted to your pharmacy. Please take as directed and contact our office if you believe you are having problem(s) with the medication(s).  A referral was ordered for orthopedics for your toe pain.   Please followup in 6 months

## 2016-08-16 ENCOUNTER — Encounter: Payer: Self-pay | Admitting: Internal Medicine

## 2016-08-16 ENCOUNTER — Ambulatory Visit (INDEPENDENT_AMBULATORY_CARE_PROVIDER_SITE_OTHER): Payer: Medicare Other | Admitting: Internal Medicine

## 2016-08-16 VITALS — BP 112/78 | HR 75 | Temp 97.9°F | Resp 16 | Wt 177.0 lb

## 2016-08-16 DIAGNOSIS — I739 Peripheral vascular disease, unspecified: Secondary | ICD-10-CM

## 2016-08-16 DIAGNOSIS — R1312 Dysphagia, oropharyngeal phase: Secondary | ICD-10-CM

## 2016-08-16 DIAGNOSIS — J309 Allergic rhinitis, unspecified: Secondary | ICD-10-CM | POA: Insufficient documentation

## 2016-08-16 DIAGNOSIS — I10 Essential (primary) hypertension: Secondary | ICD-10-CM | POA: Diagnosis not present

## 2016-08-16 DIAGNOSIS — M79675 Pain in left toe(s): Secondary | ICD-10-CM

## 2016-08-16 DIAGNOSIS — I779 Disorder of arteries and arterioles, unspecified: Secondary | ICD-10-CM | POA: Diagnosis not present

## 2016-08-16 DIAGNOSIS — I6930 Unspecified sequelae of cerebral infarction: Secondary | ICD-10-CM

## 2016-08-16 DIAGNOSIS — R1310 Dysphagia, unspecified: Secondary | ICD-10-CM | POA: Insufficient documentation

## 2016-08-16 HISTORY — DX: Dysphagia, oropharyngeal phase: R13.12

## 2016-08-16 HISTORY — DX: Allergic rhinitis, unspecified: J30.9

## 2016-08-16 MED ORDER — HYDROCHLOROTHIAZIDE 25 MG PO TABS: 25.0000 mg | ORAL_TABLET | Freq: Every day | ORAL | 5 refills | Status: DC

## 2016-08-16 MED ORDER — MELOXICAM 15 MG PO TABS: 15.0000 mg | ORAL_TABLET | Freq: Every day | ORAL | 1 refills | Status: DC

## 2016-08-16 MED ORDER — ATORVASTATIN CALCIUM 20 MG PO TABS: 20.0000 mg | ORAL_TABLET | Freq: Every day | ORAL | 5 refills | Status: DC

## 2016-08-16 MED ORDER — METOPROLOL SUCCINATE ER 100 MG PO TB24: 100.0000 mg | ORAL_TABLET | Freq: Every day | ORAL | 5 refills | Status: DC

## 2016-08-16 NOTE — Assessment & Plan Note (Signed)
With liquids only - intermittent Discussed further evaluation - we will hold off for now Continue swallow eval/ GI referral She will monitor closely - call if symptoms worsen

## 2016-08-16 NOTE — Progress Notes (Signed)
Pre visit review using our clinic review tool, if applicable. No additional management support is needed unless otherwise documented below in the visit note. 

## 2016-08-16 NOTE — Assessment & Plan Note (Signed)
Likely the cause of her mild sore throat and plugged ear Start claritin or flonase

## 2016-08-16 NOTE — Assessment & Plan Note (Signed)
Followed by neuro Start a statin - lipitor 20 mg daily

## 2016-08-16 NOTE — Assessment & Plan Note (Signed)
Sees neuro Continue ASA 81 mg daily Start lipitor 20 mg daily Check lipids,cmp in about 2 months

## 2016-08-16 NOTE — Assessment & Plan Note (Signed)
BP well controlled Current regimen effective and well tolerated Continue current medications at current doses  

## 2016-08-16 NOTE — Assessment & Plan Note (Signed)
?   Arthritis or scar tissue Will refer to ortho for further eval/treatment

## 2016-09-13 ENCOUNTER — Encounter: Payer: Self-pay | Admitting: Internal Medicine

## 2016-09-13 ENCOUNTER — Ambulatory Visit (INDEPENDENT_AMBULATORY_CARE_PROVIDER_SITE_OTHER): Payer: Medicare Other | Admitting: Internal Medicine

## 2016-09-13 ENCOUNTER — Other Ambulatory Visit (INDEPENDENT_AMBULATORY_CARE_PROVIDER_SITE_OTHER): Payer: Medicare Other

## 2016-09-13 VITALS — BP 122/86 | HR 69 | Temp 97.8°F | Resp 16 | Wt 181.0 lb

## 2016-09-13 DIAGNOSIS — I6930 Unspecified sequelae of cerebral infarction: Secondary | ICD-10-CM | POA: Diagnosis not present

## 2016-09-13 DIAGNOSIS — I739 Peripheral vascular disease, unspecified: Secondary | ICD-10-CM

## 2016-09-13 DIAGNOSIS — I779 Disorder of arteries and arterioles, unspecified: Secondary | ICD-10-CM

## 2016-09-13 DIAGNOSIS — R1084 Generalized abdominal pain: Secondary | ICD-10-CM | POA: Insufficient documentation

## 2016-09-13 DIAGNOSIS — K5900 Constipation, unspecified: Secondary | ICD-10-CM | POA: Insufficient documentation

## 2016-09-13 DIAGNOSIS — I10 Essential (primary) hypertension: Secondary | ICD-10-CM

## 2016-09-13 LAB — CBC WITH DIFFERENTIAL/PLATELET
Basophils Absolute: 0 10*3/uL (ref 0.0–0.1)
Basophils Relative: 0.3 % (ref 0.0–3.0)
Eosinophils Absolute: 0.1 10*3/uL (ref 0.0–0.7)
Eosinophils Relative: 1 % (ref 0.0–5.0)
HCT: 40.3 % (ref 36.0–46.0)
Hemoglobin: 14.1 g/dL (ref 12.0–15.0)
Lymphocytes Relative: 20.9 % (ref 12.0–46.0)
Lymphs Abs: 1.7 10*3/uL (ref 0.7–4.0)
MCHC: 35.1 g/dL (ref 30.0–36.0)
MCV: 91.8 fl (ref 78.0–100.0)
Monocytes Absolute: 0.5 10*3/uL (ref 0.1–1.0)
Monocytes Relative: 6.2 % (ref 3.0–12.0)
Neutro Abs: 5.9 10*3/uL (ref 1.4–7.7)
Neutrophils Relative %: 71.6 % (ref 43.0–77.0)
Platelets: 230 10*3/uL (ref 150.0–400.0)
RBC: 4.39 Mil/uL (ref 3.87–5.11)
RDW: 13.3 % (ref 11.5–15.5)
WBC: 8.2 10*3/uL (ref 4.0–10.5)

## 2016-09-13 LAB — COMPREHENSIVE METABOLIC PANEL
ALT: 13 U/L (ref 0–35)
AST: 15 U/L (ref 0–37)
Albumin: 4.2 g/dL (ref 3.5–5.2)
Alkaline Phosphatase: 95 U/L (ref 39–117)
BUN: 11 mg/dL (ref 6–23)
CO2: 33 mEq/L — ABNORMAL HIGH (ref 19–32)
Calcium: 9.7 mg/dL (ref 8.4–10.5)
Chloride: 101 mEq/L (ref 96–112)
Creatinine, Ser: 0.75 mg/dL (ref 0.40–1.20)
GFR: 105.3 mL/min (ref >60.00)
Glucose, Bld: 95 mg/dL (ref 70–99)
Potassium: 3.6 mEq/L (ref 3.5–5.1)
Sodium: 140 mEq/L (ref 135–145)
Total Bilirubin: 0.6 mg/dL (ref 0.2–1.2)
Total Protein: 7.6 g/dL (ref 6.0–8.3)

## 2016-09-13 LAB — LIPID PANEL
Cholesterol: 226 mg/dL — ABNORMAL HIGH (ref 0–200)
HDL: 63 mg/dL (ref >39.00)
LDL Cholesterol: 133 mg/dL — ABNORMAL HIGH (ref 0–99)
NonHDL: 162.7
Total CHOL/HDL Ratio: 4
Triglycerides: 148 mg/dL (ref 0.0–149.0)
VLDL: 29.6 mg/dL (ref 0.0–40.0)

## 2016-09-13 LAB — LIPASE: Lipase: 35 U/L (ref 11.0–59.0)

## 2016-09-13 MED ORDER — CIPROFLOXACIN HCL 500 MG PO TABS: 500.0000 mg | ORAL_TABLET | Freq: Two times a day (BID) | ORAL | 0 refills | Status: DC

## 2016-09-13 MED ORDER — METRONIDAZOLE 500 MG PO TABS: 500.0000 mg | ORAL_TABLET | Freq: Three times a day (TID) | ORAL | 0 refills | Status: DC

## 2016-09-13 NOTE — Patient Instructions (Signed)
Take the stool softener and laxative daily.    Start the antibiotics.  A cat scan has been ordered to rule out diverticulitis.    Have blood work done today.   If your symptoms worsen go to the ED.

## 2016-09-13 NOTE — Progress Notes (Signed)
Subjective:    Patient ID: Kathleen Solis, female    DOB: 1966/08/05, 50 y.o.   MRN: [id number]  HPI She is here for an acute visit.   Abdominal pain:  She started having abdominal pain yesterday.  She has pain across her upper abdomen, in her RLQ and in her mid back that feel like it is coming from her abdomen.   She feels like she is constipated and that may be the cause. She has chronic constipation, but this is much worse than usual.  She tried a laxative and stool softener and it did not help - she took 4 pills two days ago.  Her stool was black yesterday.  She denies blood in the stool.  When she does have a BM it is hard stool.  She is not going relgularly.  She has been told she has a redundant colon   She has had diverticulitis in the past and this feels similar to that.  She has been sweating intermittently and that is unusual for her. She has also had headaches on the right side of her head and is not sure if that is related.   Medications and allergies reviewed with patient and updated if appropriate.  Patient Active Problem List   Diagnosis Date Noted  . Dysphagia 08/16/2016  . Great toe pain, left 08/16/2016  . Allergic rhinitis 08/16/2016  . Carotid arterial disease (HCC) 04/10/2016  . Diverticulitis of colon without hemorrhage 02/25/2016  . Bunion of left foot 02/08/2016  . Bone spur of right foot 02/08/2016  . Leg neuralgia, right 02/08/2016  . Episodic tension type headache 01/12/2016  . Benign essential HTN 03/03/2015  . History of cerebrovascular accident with residual deficit 03/03/2015    Current Outpatient Prescriptions on File Prior to Visit  Medication Sig Dispense Refill  . aspirin EC 81 MG tablet Take 1 tablet (81 mg total) by mouth daily.    Marland Kitchen atorvastatin (LIPITOR) 20 MG tablet Take 1 tablet (20 mg total) by mouth daily. 30 tablet 5  . hydrochlorothiazide (HYDRODIURIL) 25 MG tablet Take 1 tablet (25 mg total) by mouth daily. 30 tablet 5  .  meloxicam (MOBIC) 15 MG tablet Take 1 tablet (15 mg total) by mouth daily. 30 tablet 1  . metoprolol succinate (TOPROL-XL) 100 MG 24 hr tablet Take 1 tablet (100 mg total) by mouth daily. Take with or immediately following a meal. 30 tablet 5   No current facility-administered medications on file prior to visit.     Past Medical History:  Diagnosis Date  . Diverticulitis   . Hypertension   . Stroke [facility])     Past Surgical History:  Procedure Laterality Date  . ABDOMINAL HYSTERECTOMY     partial  . ANAL FISSURE REPAIR    . APPENDECTOMY    . CHOLECYSTECTOMY    . TUBAL LIGATION      Social History   Social History  . Marital status: Single    Spouse name: N/A  . Number of children: N/A  . Years of education: N/A   Social History Main Topics  . Smoking status: Never Smoker  . Smokeless tobacco: Never Used  . Alcohol use No  . Drug use: No  . Sexual activity: Not Asked   Other Topics Concern  . None   Social History Narrative   No regular exercise    Family History  Problem Relation Age of Onset  . Breast cancer Sister     Review of  Systems  Constitutional: Positive for diaphoresis. Negative for fever.  Cardiovascular: Positive for chest pain (chest wall pain- sternum - reproducible. ).  Gastrointestinal: Positive for abdominal pain, constipation and nausea (mild). Negative for blood in stool and diarrhea.       Black stool, no GERD  Genitourinary: Negative for dysuria and hematuria.  Musculoskeletal: Positive for back pain.  Neurological: Positive for headaches (on right side of head).       Objective:   Vitals:   09/13/16 1629  BP: 122/86  Pulse: 69  Resp: 16  Temp: 97.8 F (36.6 C)   Filed Weights   09/13/16 1629  Weight: 181 lb (82.1 kg)   Body mass index is 29.66 kg/m.  Wt Readings from Last 3 Encounters:  09/13/16 181 lb (82.1 kg)  08/16/16 177 lb (80.3 kg)  02/08/16 173 lb (78.5 kg)     Physical Exam  Constitutional: She appears  well-developed and well-nourished. No distress.  HENT:  Head: Normocephalic and atraumatic.  Eyes: Conjunctivae are normal.  Abdominal: Soft. She exhibits no distension and no mass. There is tenderness (tenderness in epigastric region, LUQ, RLQ without rebound or quarding). There is no rebound and no guarding.  Musculoskeletal: She exhibits no edema.  Skin: Skin is warm and dry. She is not diaphoretic.          Assessment & Plan:   See Problem List for Assessment and Plan of chronic medical problems.

## 2016-09-13 NOTE — Progress Notes (Signed)
Pre visit review using our clinic review tool, if applicable. No additional management support is needed unless otherwise documented below in the visit note. 

## 2016-09-13 NOTE — Assessment & Plan Note (Signed)
Since yesterday Possibly from constipation, concern for possible diverticulitis - has had diverticulitis in the past, ? UGIB given black stool hemoccult cards given Check labs Ct of A/P to r/o diverticulitis Will start empiric antibiotics - flagyl/cipro in the event this is diverticultisi

## 2016-09-13 NOTE — Assessment & Plan Note (Signed)
Acute on chronic in nature Continue stool softener for now Consider linzess after acute pain/issue/infection has resolved

## 2016-09-15 ENCOUNTER — Ambulatory Visit (INDEPENDENT_AMBULATORY_CARE_PROVIDER_SITE_OTHER)
Admission: RE | Admit: 2016-09-15 | Discharge: 2016-09-15 | Disposition: A | Discharge status: Home / Self Care | Payer: Medicare Other | Source: Ambulatory Visit | Attending: Internal Medicine | Admitting: Internal Medicine

## 2016-09-15 ENCOUNTER — Telehealth: Payer: Self-pay | Admitting: Emergency Medicine

## 2016-09-15 DIAGNOSIS — R1084 Generalized abdominal pain: Secondary | ICD-10-CM

## 2016-09-15 MED ORDER — IOPAMIDOL (ISOVUE-300) INJECTION 61%
100.0000 mL | Freq: Once | INTRAVENOUS | Status: AC | PRN
  Administered 2016-09-15: 100 mL via INTRAVENOUS

## 2016-09-15 MED ORDER — LINACLOTIDE 145 MCG PO CAPS: 145.0000 ug | ORAL_CAPSULE | Freq: Every day | ORAL | 0 refills | Status: DC

## 2016-09-15 NOTE — Telephone Encounter (Signed)
RX sent to [facility],  Per lab results.

## 2016-09-15 NOTE — Telephone Encounter (Signed)
Per MD, pt is to stop anitibiotic. Results of CT were normal.

## 2016-10-11 ENCOUNTER — Telehealth: Payer: Self-pay | Admitting: Internal Medicine

## 2016-10-11 NOTE — Telephone Encounter (Signed)
Patient Name: Kathleen BorerDELORES Kathleen Solis  DOB: 09-28-1965    Initial Comment Caller states that she was DX with the flu on Friday; She is having weakness, nausea, headache   Nurse Assessment  Nurse: Scarlette ArStandifer, RN, Heather Date/Time (Eastern Time): 10/11/2016 9:34:06 AM  Confirm and document reason for call. If symptomatic, describe symptoms. ---Caller states that she was DX with the flu on Friday, her flu test was negative, but are treating her for the flu; She is having cough, runny nose, weakness, nausea, sore throat, headache still, she is not any better, she might be worse.  Does the patient have any new or worsening symptoms? ---Yes  Will a triage be completed? ---Yes  Related visit to physician within the last 2 weeks? ---Yes  Does the PT have any chronic conditions? (i.e. diabetes, asthma, etc.) ---Yes  List chronic conditions. ---See MR  Is the patient pregnant or possibly pregnant? (Ask all females between the ages of 3912-55) ---No  Is this a behavioral health or substance abuse call? ---No     Guidelines    Guideline Title Affirmed Question Affirmed Notes  Weakness (Generalized) and Fatigue [1] MODERATE weakness (i.e., interferes with work, school, normal activities) AND [2] cause unknown (Exceptions: weakness with acute minor illness, or weakness from poor fluid intake)    Final Disposition User   See Physician within 4 Hours (or PCP triage) Scarlette ArStandifer, RN, Heather    Referrals  GO TO FACILITY UNDECIDED   Disagree/Comply: Comply

## 2016-11-09 ENCOUNTER — Encounter: Payer: Self-pay | Admitting: Internal Medicine

## 2016-11-09 ENCOUNTER — Ambulatory Visit (INDEPENDENT_AMBULATORY_CARE_PROVIDER_SITE_OTHER): Payer: Medicare Other | Admitting: Internal Medicine

## 2016-11-09 VITALS — BP 130/88 | HR 71 | Temp 98.5°F | Ht 65.5 in | Wt 182.2 lb

## 2016-11-09 DIAGNOSIS — J069 Acute upper respiratory infection, unspecified: Secondary | ICD-10-CM

## 2016-11-09 DIAGNOSIS — B9789 Other viral agents as the cause of diseases classified elsewhere: Secondary | ICD-10-CM

## 2016-11-09 DIAGNOSIS — J029 Acute pharyngitis, unspecified: Secondary | ICD-10-CM | POA: Diagnosis not present

## 2016-11-09 HISTORY — DX: Acute upper respiratory infection, unspecified: J06.9

## 2016-11-09 LAB — POCT INFLUENZA A/B
Influenza A, POC: NEGATIVE
Influenza B, POC: NEGATIVE

## 2016-11-09 MED ORDER — FLUTICASONE PROPIONATE 50 MCG/ACT NA SUSP: 2.0000 | Freq: Every day | NASAL | 6 refills | Status: DC

## 2016-11-09 MED ORDER — PROMETHAZINE-DM 6.25-15 MG/5ML PO SYRP: 5.0000 mL | ORAL_SOLUTION | Freq: Four times a day (QID) | ORAL | 0 refills | Status: DC | PRN

## 2016-11-09 NOTE — Progress Notes (Signed)
Subjective:    Patient ID: Kathleen Solis, female    DOB: 1966/05/18, 51 y.o.   MRN: [id number]  HPI She is here for an acute visit.   Cold symptoms:  Her symptoms started two days ago.  She has a sore throat, rhinorrhea, sinus pressure, cough, headaches, sweaty and achy.  She feels warm, but denies fever.  She does not feel well.   She has taken Advil without improvement in symptoms.  Her son is sick.   Medications and allergies reviewed with patient and updated if appropriate.  Patient Active Problem List   Diagnosis Date Noted  . Generalized abdominal pain 09/13/2016  . Constipation 09/13/2016  . Dysphagia 08/16/2016  . Great toe pain, left 08/16/2016  . Allergic rhinitis 08/16/2016  . Carotid arterial disease (HCC) 04/10/2016  . Diverticulitis of colon without hemorrhage 02/25/2016  . Bunion of left foot 02/08/2016  . Bone spur of right foot 02/08/2016  . Leg neuralgia, right 02/08/2016  . Episodic tension type headache 01/12/2016  . Benign essential HTN 03/03/2015  . History of cerebrovascular accident with residual deficit 03/03/2015    Current Outpatient Prescriptions on File Prior to Visit  Medication Sig Dispense Refill  . aspirin EC 81 MG tablet Take 1 tablet (81 mg total) by mouth daily.    Marland Kitchen. atorvastatin (LIPITOR) 20 MG tablet Take 1 tablet (20 mg total) by mouth daily. 30 tablet 5  . hydrochlorothiazide (HYDRODIURIL) 25 MG tablet Take 1 tablet (25 mg total) by mouth daily. 30 tablet 5  . linaclotide (LINZESS) 145 MCG CAPS capsule Take 1 capsule (145 mcg total) by mouth daily before breakfast. 30 capsule 0  . metoprolol succinate (TOPROL-XL) 100 MG 24 hr tablet Take 1 tablet (100 mg total) by mouth daily. Take with or immediately following a meal. 30 tablet 5   No current facility-administered medications on file prior to visit.     Past Medical History:  Diagnosis Date  . Diverticulitis   . Hypertension   . Stroke [facility](HCC)     Past Surgical History:    Procedure Laterality Date  . ABDOMINAL HYSTERECTOMY     partial  . ANAL FISSURE REPAIR    . APPENDECTOMY    . CHOLECYSTECTOMY    . TUBAL LIGATION      Social History   Social History  . Marital status: Single    Spouse name: N/A  . Number of children: N/A  . Years of education: N/A   Social History Main Topics  . Smoking status: Never Smoker  . Smokeless tobacco: Never Used  . Alcohol use No  . Drug use: No  . Sexual activity: Not Asked   Other Topics Concern  . None   Social History Narrative   No regular exercise    Family History  Problem Relation Age of Onset  . Breast cancer Sister     Review of Systems  Constitutional: Negative for appetite change, chills and fever.  HENT: Positive for congestion, rhinorrhea, sinus pressure and sore throat. Negative for ear pain (fluid in ears).   Respiratory: Positive for cough (dry). Negative for shortness of breath and wheezing.   Cardiovascular: Negative for chest pain.  Gastrointestinal: Positive for nausea. Negative for abdominal pain and diarrhea.  Musculoskeletal: Positive for myalgias.  Neurological: Positive for headaches. Negative for dizziness and light-headedness.       Objective:   Vitals:   11/09/16 1506  BP: 130/88  Pulse: 71  Temp: 98.5 F (36.9 C)  Filed Weights   11/09/16 1506  Weight: 182 lb 4 oz (82.7 kg)   Body mass index is 29.87 kg/m.  Wt Readings from Last 3 Encounters:  11/09/16 182 lb 4 oz (82.7 kg)  09/13/16 181 lb (82.1 kg)  08/16/16 177 lb (80.3 kg)     Physical Exam GENERAL APPEARANCE: Appears stated age, well appearing, NAD EYES: conjunctiva clear, no icterus HEENT: bilateral tympanic membranes and ear canals normal, oropharynx with no erythema, no thyromegaly, trachea midline, no cervical or supraclavicular lymphadenopathy LUNGS: Clear to auscultation without wheeze or crackles, unlabored breathing, good air entry bilaterally HEART: Normal S1,S2 without  murmurs EXTREMITIES: Without clubbing, cyanosis, or edema        Assessment & Plan:   See Problem List for Assessment and Plan of chronic medical problems.

## 2016-11-09 NOTE — Patient Instructions (Addendum)
You were prescribed cough syrup and flonase.   Your prescription(s) have been submitted to your pharmacy or been printed and provided for you. Please take as directed and contact our office if you believe you are having problem(s) with the medication(s) or have any questions.  If your symptoms worsen or fail to improve, please contact our office for further instruction, or in case of emergency go directly to the emergency room at the closest medical facility.   General Recommendations:    Please drink plenty of fluids.  Get plenty of rest   Sleep in humidified air  Use saline nasal sprays  Netti pot  OTC Medications:  Decongestants - helps relieve congestion   Flonase (generic fluticasone) or Nasacort (generic triamcinolone) - please make sure to use the "cross-over" technique at a 45 degree angle towards the opposite eye as opposed to straight up the nasal passageway.   Sudafed (generic pseudoephedrine - Note this is the one that is available behind the pharmacy counter); Products with phenylephrine (-PE) may also be used but is often not as effective as pseudoephedrine.   If you have HIGH BLOOD PRESSURE - Coricidin HBP; AVOID any product that is -D as this contains pseudoephedrine which may increase your blood pressure.  Afrin (oxymetazoline) every 6-8 hours for up to 3 days.  Allergies - helps relieve runny nose, itchy eyes and sneezing   Claritin (generic loratidine), Allegra (fexofenidine), or Zyrtec (generic cyrterizine) for runny nose. These medications should not cause drowsiness.  Note - Benadryl (generic diphenhydramine) may be used however may cause drowsiness  Cough -   Delsym or Robitussin (generic dextromethorphan)  Expectorants - helps loosen mucus to ease removal   Mucinex (generic guaifenesin) as directed on the package.  Headaches / General Aches   Tylenol (generic acetaminophen) - DO NOT EXCEED 3 grams (3,000 mg) in a 24 hour time  period  Advil/Motrin (generic ibuprofen)  Sore Throat -   Salt water gargle   Chloraseptic (generic benzocaine) spray or lozenges / Sucrets (generic dyclonine)

## 2016-11-09 NOTE — Progress Notes (Signed)
Pre visit review using our clinic review tool, if applicable. No additional management support is needed unless otherwise documented below in the visit note. 

## 2016-11-09 NOTE — Assessment & Plan Note (Signed)
Rapid flu test negative Does not appear ill and her symptoms are consistent with a viral illness Symptomatic treatment  -- an antibiotic is not needed flonase Tylenol, advil Promethazine-dextromethophan cough syrup Rest, fluids Call if no improvement

## 2016-11-10 ENCOUNTER — Telehealth: Payer: Self-pay | Admitting: Emergency Medicine

## 2016-11-10 DIAGNOSIS — H9311 Tinnitus, right ear: Secondary | ICD-10-CM

## 2016-11-10 NOTE — Telephone Encounter (Signed)
Keep using the cold medications, especially the flonase nasal spray

## 2016-11-10 NOTE — Telephone Encounter (Signed)
Pt came into office today c/o of ringing in her right ear that started around 130 am and has not stopped since then. Informed her that we could send a referral to ENT to get done ASAP. Please enter.

## 2016-11-10 NOTE — Telephone Encounter (Signed)
Is there anything that pt can do in the mean time?

## 2016-11-11 ENCOUNTER — Emergency Department (HOSPITAL_COMMUNITY)
Admission: EM | Admit: 2016-11-11 | Discharge: 2016-11-11 | Disposition: A | Discharge status: Home / Self Care | Payer: Medicare Other | Attending: Emergency Medicine | Admitting: Emergency Medicine

## 2016-11-11 ENCOUNTER — Emergency Department (HOSPITAL_COMMUNITY): Payer: Medicare Other

## 2016-11-11 ENCOUNTER — Encounter (HOSPITAL_COMMUNITY): Payer: Self-pay | Admitting: Emergency Medicine

## 2016-11-11 DIAGNOSIS — H9311 Tinnitus, right ear: Secondary | ICD-10-CM

## 2016-11-11 DIAGNOSIS — I10 Essential (primary) hypertension: Secondary | ICD-10-CM | POA: Insufficient documentation

## 2016-11-11 DIAGNOSIS — Z79899 Other long term (current) drug therapy: Secondary | ICD-10-CM | POA: Diagnosis not present

## 2016-11-11 DIAGNOSIS — R519 Headache, unspecified: Secondary | ICD-10-CM

## 2016-11-11 DIAGNOSIS — Z7982 Long term (current) use of aspirin: Secondary | ICD-10-CM | POA: Insufficient documentation

## 2016-11-11 DIAGNOSIS — R42 Dizziness and giddiness: Secondary | ICD-10-CM | POA: Insufficient documentation

## 2016-11-11 DIAGNOSIS — R51 Headache: Secondary | ICD-10-CM | POA: Diagnosis present

## 2016-11-11 DIAGNOSIS — Z8673 Personal history of transient ischemic attack (TIA), and cerebral infarction without residual deficits: Secondary | ICD-10-CM | POA: Insufficient documentation

## 2016-11-11 MED ORDER — DIPHENHYDRAMINE HCL 50 MG/ML IJ SOLN
25.0000 mg | Freq: Once | INTRAMUSCULAR | Status: DC
  Filled 2016-11-11: qty 1

## 2016-11-11 MED ORDER — METOCLOPRAMIDE HCL 10 MG PO TABS: 10.0000 mg | ORAL_TABLET | Freq: Four times a day (QID) | ORAL | 0 refills | Status: DC

## 2016-11-11 MED ORDER — DIPHENHYDRAMINE HCL 25 MG PO CAPS: 25.0000 mg | ORAL_CAPSULE | Freq: Four times a day (QID) | ORAL | 0 refills | Status: DC | PRN

## 2016-11-11 MED ORDER — METOCLOPRAMIDE HCL 5 MG/ML IJ SOLN
10.0000 mg | Freq: Once | INTRAMUSCULAR | Status: DC
  Filled 2016-11-11: qty 2

## 2016-11-11 MED ORDER — SODIUM CHLORIDE 0.9 % IV BOLUS (SEPSIS): 1000.0000 mL | Freq: Once | INTRAVENOUS | Status: DC

## 2016-11-11 NOTE — Telephone Encounter (Signed)
Spoke with pt to inform.  

## 2016-11-11 NOTE — ED Triage Notes (Signed)
Pt. Reports headache with ringing in right ear since yesterday morning at 0130. Pt. Ambulatory, a&o x 4. Pt. Reports hx of stroke.

## 2016-11-11 NOTE — ED Provider Notes (Signed)
[facility]-EMERGENCY DEPT Provider Note   CSN: [id number] Arrival date & time: 11/11/16  [id number]     History   Chief Complaint Chief Complaint  Patient presents with  . Headache  . Dizziness    HPI Kathleen Solis is a 51 y.o. female with a past medical history significant for hypertension, prior strokes, and recent diagnosis of viral upper history infection on Flonase and promethazine who presents with headache, right-sided tinnitus, room spinning and dizziness, and right leg numbness. Patient reports that she was having cold-like symptoms for the last week from being her to see her PCP several days ago. Patient reports a negative flu test but yesterday morning at 1:30 AM, she woke up with the headache, right-sided tinnitus, room spinning, and right leg numbness. She reports that at baseline, she has some right sided weakness but this "feels different". She says that she has had multiple strokes in the past and is on disability for them. She denies fevers, chills, chest pain, shortness of breath, constipation, diarrhea, or dysuria. She does report nausea with her headache. She denies photophobia. She denies head trauma. She says that the room spinning sensation began with her headache and has been persistent. She denies vision changes. She denies new weakness or coordination problems.                Headache   This is a new problem. The current episode started yesterday. The problem occurs constantly. The problem has not changed since onset.The headache is associated with nothing. The pain is located in the occipital region. The quality of the pain is described as throbbing. The pain is at a severity of 7/10. The pain is moderate. The pain does not radiate. Associated symptoms include nausea. Pertinent negatives include no fever, no chest pressure, no palpitations, no syncope, no shortness of breath and no vomiting. Associated symptoms comments: Dizziness . She has tried nothing for the symptoms.    Dizziness  Quality:  Head spinning Severity:  Moderate Onset quality:  Sudden Timing:  Constant Progression:  Unchanged Relieved by:  Nothing Worsened by:  Nothing Associated symptoms: headaches and nausea   Associated symptoms: no chest pain, no diarrhea, no palpitations, no shortness of breath, no syncope, no vomiting and no weakness   Risk factors: hx of stroke     Past Medical History:  Diagnosis Date  . Diverticulitis   . Hypertension   . Stroke [facility](HCC)     Patient Active Problem List   Diagnosis Date Noted  . Viral upper respiratory tract infection 11/09/2016  . Sore throat 11/09/2016  . Generalized abdominal pain 09/13/2016  . Constipation 09/13/2016  . Dysphagia 08/16/2016  . Great toe pain, left 08/16/2016  . Allergic rhinitis 08/16/2016  . Carotid arterial disease (HCC) 04/10/2016  . Diverticulitis of colon without hemorrhage 02/25/2016  . Bunion of left foot 02/08/2016  . Bone spur of right foot 02/08/2016  . Leg neuralgia, right 02/08/2016  . Episodic tension type headache 01/12/2016  . Benign essential HTN 03/03/2015  . History of cerebrovascular accident with residual deficit 03/03/2015    Past Surgical History:  Procedure Laterality Date  . ABDOMINAL HYSTERECTOMY     partial  . ANAL FISSURE REPAIR    . APPENDECTOMY    . CHOLECYSTECTOMY    . TUBAL LIGATION      OB History    Gravida Para Term Preterm AB Living   4 2 2   2 2    SAB TAB Ectopic Multiple Live Births  2               Home Medications    Prior to Admission medications   Medication Sig Start Date End Date Taking? Authorizing Provider  aspirin EC 81 MG tablet Take 1 tablet (81 mg total) by mouth daily. 02/08/16  Yes Pincus Sanes, MD  atorvastatin (LIPITOR) 20 MG tablet Take 1 tablet (20 mg total) by mouth daily. 08/16/16  Yes Pincus Sanes, MD  fluticasone (FLONASE) 50 MCG/ACT nasal spray Place 2 sprays into both nostrils daily. 11/09/16  Yes Pincus Sanes, MD   hydrochlorothiazide (HYDRODIURIL) 25 MG tablet Take 1 tablet (25 mg total) by mouth daily. 08/16/16  Yes Pincus Sanes, MD  linaclotide (LINZESS) 145 MCG CAPS capsule Take 1 capsule (145 mcg total) by mouth daily before breakfast. Patient taking differently: Take 145 mcg by mouth every other day.  09/15/16  Yes Pincus Sanes, MD  metoprolol succinate (TOPROL-XL) 100 MG 24 hr tablet Take 1 tablet (100 mg total) by mouth daily. Take with or immediately following a meal. 08/16/16  Yes Pincus Sanes, MD  promethazine-dextromethorphan (PROMETHAZINE-DM) 6.25-15 MG/5ML syrup Take 5 mLs by mouth 4 (four) times daily as needed for cough. 11/09/16  Yes Pincus Sanes, MD    Family History Family History  Problem Relation Age of Onset  . Breast cancer Sister     Social History Social History  Substance Use Topics  . Smoking status: Never Smoker  . Smokeless tobacco: Never Used  . Alcohol use No     Allergies   Compazine; Morphine and related; Percocet [oxycodone-acetaminophen]; and Sulfa antibiotics   Review of Systems Review of Systems  Constitutional: Negative for chills, diaphoresis, fatigue and fever.  HENT: Positive for congestion.   Eyes: Negative for visual disturbance.  Respiratory: Negative for chest tightness and shortness of breath.   Cardiovascular: Negative for chest pain, palpitations, leg swelling and syncope.  Gastrointestinal: Positive for nausea. Negative for abdominal pain, constipation, diarrhea and vomiting.  Genitourinary: Negative for dysuria.  Musculoskeletal: Negative for back pain, neck pain and neck stiffness.  Skin: Negative for rash.  Neurological: Positive for dizziness, numbness and headaches. Negative for seizures, facial asymmetry, speech difficulty, weakness and light-headedness.  Psychiatric/Behavioral: Negative for agitation.  All other systems reviewed and are negative.    Physical Exam Updated Vital Signs BP (!) 148/99   Pulse 66   Temp 98.1 F  (36.7 C)   Resp 18   Ht 5' 5.5" (1.664 m)   Wt 182 lb (82.6 kg)   SpO2 99%   BMI 29.83 kg/m   Physical Exam  Constitutional: She is oriented to person, place, and time. She appears well-developed and well-nourished. No distress.  HENT:  Head: Normocephalic and atraumatic.  Right Ear: External ear normal.  Left Ear: External ear normal.  Nose: Nose normal.  Mouth/Throat: Oropharynx is clear and moist. No oropharyngeal exudate.  Eyes: Conjunctivae and EOM are normal. Pupils are equal, round, and reactive to light.  Neck: Normal range of motion. Neck supple.  Cardiovascular: Normal rate, normal heart sounds and intact distal pulses.   No murmur heard. Pulmonary/Chest: Effort normal and breath sounds normal. No stridor. No respiratory distress. She has no wheezes. She has no rales. She exhibits no tenderness.  Abdominal: Soft. She exhibits no distension. There is no tenderness. There is no rebound.  Neurological: She is alert and oriented to person, place, and time. She has normal reflexes. She displays no tremor and  normal reflexes. No cranial nerve deficit or sensory deficit. She exhibits normal muscle tone. She displays no seizure activity. Coordination normal. GCS eye subscore is 4. GCS verbal subscore is 5. GCS motor subscore is 6.  Patient reported right leg numbness feeling but on exam, no specific areas of numbness were appreciated. Normal plantar flexion bilaterally. Normal reflexes. Normal grip strength and sensation in upper extremities. No focal neurologic deficits seen. Patient does have head spinning dizziness.  Skin: Skin is warm. Capillary refill takes less than 2 seconds. No rash noted. She is not diaphoretic. No erythema.  Psychiatric: She has a normal mood and affect.  Nursing note and vitals reviewed.    ED Treatments / Results  Labs (all labs ordered are listed, but only abnormal results are displayed) Labs Reviewed  CBC WITH DIFFERENTIAL/PLATELET  BASIC METABOLIC  PANEL  PROTIME-INR    EKG  EKG Interpretation None       Radiology Ct Head Wo Contrast  Result Date: 11/11/2016 CLINICAL DATA:  Right-sided tinnitus and headache EXAM: CT HEAD WITHOUT CONTRAST TECHNIQUE: Contiguous axial images were obtained from the base of the skull through the vertex without intravenous contrast. COMPARISON:  Head CT November 24, 2015 and brain MRI Jan 19, 2016 FINDINGS: Brain: The ventricles are normal in size and configuration. There is no intracranial mass, hemorrhage, extra-axial fluid collection, or midline shift. Areas of abnormal signal in the left corona radiata on previous MR examination are not appreciable on noncontrast enhanced CT. No gray-white compartment lesions are evident by CT. No acute infarct evident. Vascular: No hyperdense vessel. There is calcification in each carotid siphon, slightly more on the left than on the right. Skull: The bony calvarium appears intact. Sinuses/Orbits: Visualized paranasal sinuses are clear. Visualized orbits appear symmetric bilaterally. Other: Mastoid air cells are clear. IMPRESSION: No gray-white compartment lesions are evident by CT. Areas of abnormal signal in the left corona radiata seen on prior MR not appreciated on this CT examination. No mass, hemorrhage, or extra-axial fluid collection. Mild carotid siphon calcification. Study otherwise unremarkable. Electronically Signed   By: Bretta Bang III M.D.   On: 11/11/2016 10:05    Procedures Procedures (including critical care time)  Medications Ordered in ED Medications  sodium chloride 0.9 % bolus 1,000 mL (not administered)  metoCLOPramide (REGLAN) injection 10 mg (not administered)  diphenhydrAMINE (BENADRYL) injection 25 mg (not administered)     Initial Impression / Assessment and Plan / ED Course  I have reviewed the triage vital signs and the nursing notes.  Pertinent labs & imaging results that were available during my care of the patient were reviewed  by me and considered in my medical decision making (see chart for details).     Kathleen Solis is a 51 y.o. female with a past medical history significant for hypertension, prior strokes, and recent diagnosis of viral upper history infection on Flonase and promethazine who presents with headache, right-sided tinnitus, room spinning and dizziness, and right leg numbness.  History and exam are seen above.  On exam, patient's ear exam did not show acute evidence of otitis media or otitis externa. No tenderness in the mastoid. Doubt mastoiditis. Neck nontender. Lungs clear. Abdomen nontender. Normal strength in upper extremities. Normal coordination and finger-nose-finger. On the lower external exam, patient reported difference in sensation in right leg versus left. No weakness on exam.  Based on patient's headache, she'll be given Reglan and Benadryl and fluids to help her headache. We'll try and see if this alleviates  her tinnitus and symptoms.   Due to the history of stroke, and the spinning sensation with the unilateral sensation difference, patient will have a CT head to evaluate for hemorrhage or stroke. Anticipate speaking with neurology to determine further workup plans if imaging is negative.   12:12 PM CT imaging returned showing improvement and abnormality on prior MRI. No hemorrhage, masses, or other abnormalities seen.   Patient stuck multiple times for blood and IV placement. Unfortunately, patient had an unsuccessful sticks. Patient does not want to wait to get IV medications, fluids, or blood work. Patient reports that she has to leave to go pick up her son at school and will not be able to stay for MRI or  neurology evaluation.  Patient instead wants the oral medication prescriptions to treat her symptoms at home. Patient will be given Reglan and Benadryl prescriptions as Compazine causes her reactions. Patient will call her PCP and neurologist today for close follow-up. Patient  given strict return precautions since stroke and TIA were not definitively ruled out today. Patient encouraged to return if any symptoms change or worsen or she is unable to see her PCP quickly.   Patient was understanding of the plan of care and had no other questions or concerns. Patient discharged in stable condition.    Final Clinical Impressions(s) / ED Diagnoses   Final diagnoses:  Nonintractable headache, unspecified chronicity pattern, unspecified headache type  Tinnitus of right ear  Dizziness    New Prescriptions New Prescriptions   DIPHENHYDRAMINE (BENADRYL) 25 MG CAPSULE    Take 1 capsule (25 mg total) by mouth every 6 (six) hours as needed.   METOCLOPRAMIDE (REGLAN) 10 MG TABLET    Take 1 tablet (10 mg total) by mouth every 6 (six) hours.    Clinical Impression: 1. Nonintractable headache, unspecified chronicity pattern, unspecified headache type   2. Tinnitus of right ear   3. Dizziness     Disposition: Discharge  Condition: Good  I have discussed the results, Dx and Tx plan with the pt(& family if present). He/she/they expressed understanding and agree(s) with the plan. Discharge instructions discussed at great length. Strict return precautions discussed and pt &/or family have verbalized understanding of the instructions. No further questions at time of discharge.    New Prescriptions   DIPHENHYDRAMINE (BENADRYL) 25 MG CAPSULE    Take 1 capsule (25 mg total) by mouth every 6 (six) hours as needed.   METOCLOPRAMIDE (REGLAN) 10 MG TABLET    Take 1 tablet (10 mg total) by mouth every 6 (six) hours.    Follow Up: Pincus Sanes, MD [address] [place] [address] [phone]  Schedule an appointment as soon as possible for a visit    [facility] DEPT [address] [id number] mc [place] [address] [phone]  If symptoms worsen     Heide Scales, MD 11/11/16 2020

## 2016-11-11 NOTE — ED Notes (Signed)
Patient transported to CT 

## 2016-11-11 NOTE — ED Notes (Signed)
Attempted x 1 to start IV. Patient requested someone else to start IV. Charge Nurse notified.

## 2016-11-11 NOTE — Discharge Instructions (Signed)
Please take your Reglan and Benadryl to help with your headache. Please stay hydrated. Please call and schedule an appointment with your PCP and/or neurologist for further management. If your symptoms worsen or do not improve, please return the nearest emergency department.

## 2016-11-11 NOTE — ED Notes (Signed)
Iv Team came to start IV and patient stated she did not want because she did not have time for it. Patient stated she had to pick her son up from school at 2:30.

## 2016-11-11 NOTE — ED Notes (Signed)
RN at bedside starting IV will collect labs 

## 2016-11-11 NOTE — ED Notes (Signed)
This writer attempt twice to collect labs unsuccessful RN have been made aware.   

## 2016-11-11 NOTE — ED Notes (Signed)
EDP notified that the patient stated she wanted her CT results and wanted to go home.

## 2016-11-11 NOTE — ED Notes (Signed)
Charge RN unable to start IV using US. Consult order for IV team initiated.

## 2016-11-16 DIAGNOSIS — H9311 Tinnitus, right ear: Secondary | ICD-10-CM

## 2016-11-16 HISTORY — DX: Tinnitus, right ear: H93.11

## 2016-11-17 ENCOUNTER — Other Ambulatory Visit: Payer: Self-pay | Admitting: Otolaryngology

## 2016-11-17 DIAGNOSIS — H9311 Tinnitus, right ear: Secondary | ICD-10-CM

## 2016-11-17 DIAGNOSIS — I10 Essential (primary) hypertension: Secondary | ICD-10-CM

## 2016-11-17 DIAGNOSIS — Z8673 Personal history of transient ischemic attack (TIA), and cerebral infarction without residual deficits: Secondary | ICD-10-CM

## 2016-11-30 ENCOUNTER — Ambulatory Visit
Admission: RE | Admit: 2016-11-30 | Discharge: 2016-11-30 | Disposition: A | Discharge status: Home / Self Care | Payer: Medicare Other | Source: Ambulatory Visit | Attending: Otolaryngology | Admitting: Otolaryngology

## 2016-11-30 DIAGNOSIS — I10 Essential (primary) hypertension: Secondary | ICD-10-CM

## 2016-11-30 DIAGNOSIS — H9311 Tinnitus, right ear: Secondary | ICD-10-CM

## 2016-11-30 DIAGNOSIS — Z8673 Personal history of transient ischemic attack (TIA), and cerebral infarction without residual deficits: Secondary | ICD-10-CM

## 2016-11-30 MED ORDER — GADOBENATE DIMEGLUMINE 529 MG/ML IV SOLN
17.0000 mL | Freq: Once | INTRAVENOUS | Status: AC | PRN
  Administered 2016-11-30: 17 mL via INTRAVENOUS

## 2017-01-21 ENCOUNTER — Encounter: Payer: Self-pay | Admitting: Family Medicine

## 2017-01-21 ENCOUNTER — Ambulatory Visit (INDEPENDENT_AMBULATORY_CARE_PROVIDER_SITE_OTHER): Payer: Medicare Other | Admitting: Family Medicine

## 2017-01-21 VITALS — BP 138/90 | HR 75 | Temp 98.4°F | Ht 65.5 in | Wt 185.5 lb

## 2017-01-21 DIAGNOSIS — J069 Acute upper respiratory infection, unspecified: Secondary | ICD-10-CM

## 2017-01-21 MED ORDER — BENZONATATE 100 MG PO CAPS: 100.0000 mg | ORAL_CAPSULE | Freq: Two times a day (BID) | ORAL | 0 refills | Status: DC | PRN

## 2017-01-21 NOTE — Progress Notes (Signed)
HPI:  URI: -started: 3 days -symptoms:nasal congestion, sore throat, HA, body aches, cough -denies:fever, SOB, NVD, tooth pain -has tried: nothing -sick contacts/travel/risks: no reported flu, strep or tick exposure - child with similar symptoms and vomiting -Hx of: allergies  ROS: See pertinent positives and negatives per HPI.  Past Medical History:  Diagnosis Date  . Diverticulitis   . Hypertension   . Stroke [facility](HCC)     Past Surgical History:  Procedure Laterality Date  . ABDOMINAL HYSTERECTOMY     partial  . ANAL FISSURE REPAIR    . APPENDECTOMY    . CHOLECYSTECTOMY    . TUBAL LIGATION      Family History  Problem Relation Age of Onset  . Breast cancer Sister     Social History   Social History  . Marital status: Single    Spouse name: N/A  . Number of children: N/A  . Years of education: N/A   Social History Main Topics  . Smoking status: Never Smoker  . Smokeless tobacco: Never Used  . Alcohol use No  . Drug use: No  . Sexual activity: Not Asked   Other Topics Concern  . None   Social History Narrative   No regular exercise     Current Outpatient Prescriptions:  .  aspirin EC 81 MG tablet, Take 1 tablet (81 mg total) by mouth daily., Disp: , Rfl:  .  atorvastatin (LIPITOR) 20 MG tablet, Take 1 tablet (20 mg total) by mouth daily., Disp: 30 tablet, Rfl: 5 .  hydrochlorothiazide (HYDRODIURIL) 25 MG tablet, Take 1 tablet (25 mg total) by mouth daily., Disp: 30 tablet, Rfl: 5 .  metoCLOPramide (REGLAN) 10 MG tablet, Take 1 tablet (10 mg total) by mouth every 6 (six) hours., Disp: 21 tablet, Rfl: 0 .  metoprolol succinate (TOPROL-XL) 100 MG 24 hr tablet, Take 1 tablet (100 mg total) by mouth daily. Take with or immediately following a meal., Disp: 30 tablet, Rfl: 5 .  benzonatate (TESSALON) 100 MG capsule, Take 1 capsule (100 mg total) by mouth 2 (two) times daily as needed for cough., Disp: 20 capsule, Rfl: 0 .  linaclotide (LINZESS) 145 MCG CAPS  capsule, Take 1 capsule (145 mcg total) by mouth daily before breakfast. (Patient not taking: Reported on 01/21/2017), Disp: 30 capsule, Rfl: 0  EXAM:  Vitals:   01/21/17 1303  BP: 138/90  Pulse: 75  Temp: 98.4 F (36.9 C)    Body mass index is 30.4 kg/m.  GENERAL: vitals reviewed and listed above, alert, oriented, appears well hydrated and in no acute distress  HEENT: atraumatic, conjunttiva clear, no obvious abnormalities on inspection of external nose and ears, normal appearance of ear canals and TMs, clear nasal congestion, mild post oropharyngeal erythema with PND, no tonsillar edema or exudate, no sinus TTP  NECK: no obvious masses on inspection  LUNGS: clear to auscultation bilaterally, no wheezes, rales or rhonchi, good air movement  CV: HRRR, no peripheral edema  MS: moves all extremities without noticeable abnormality  PSYCH: pleasant and cooperative, no obvious depression or anxiety  ASSESSMENT AND PLAN:  Discussed the following assessment and plan:  Viral upper respiratory illness  -given HPI and exam findings today, a serious infection or illness is unlikely. We discussed potential etiologies, with VURI being most likely, and advised supportive care and monitoring. Sent Tessalon for cough. Discussed potential for mild flu - though late in the season, she declined testing. We discussed treatment side effects, likely course, antibiotic misuse,  transmission, and signs of developing a serious illness. -of course, we advised to return or notify a doctor immediately if symptoms worsen or persist or new concerns arise.   Patient Instructions  INSTRUCTIONS FOR UPPER RESPIRATORY INFECTION:  -plenty of rest and fluids  -nasal saline wash 2-3 times daily (use prepackaged nasal saline or bottled/distilled water if making your own)   -can use AFRIN nasal spray for drainage and nasal congestion - but do NOT use longer then 3-4 days  -can use tylenol (in no history of  liver disease) or ibuprofen (if no history of kidney disease, bowel bleeding or significant heart disease) as directed for aches and sorethroat  -in the winter time, using a humidifier at night is helpful (please follow cleaning instructions)  -if you are taking a cough medication - use only as directed, may also try a teaspoon of honey to coat the throat and throat lozenges. I sent tessalon to the pharmacy for cough as needed.  -for sore throat, salt water gargles can help  -follow up if you have fevers, facial pain, tooth pain, difficulty breathing or are worsening or symptoms persist longer then expected  Upper Respiratory Infection, Adult An upper respiratory infection (URI) is also known as the common cold. It is often caused by a type of germ (virus). Colds are easily spread (contagious). You can pass it to others by kissing, coughing, sneezing, or drinking out of the same glass. Usually, you get better in 1 to 3  weeks.  However, the cough can last for even longer. HOME CARE   Only take medicine as told by your doctor. Follow instructions provided above.  Drink enough water and fluids to keep your pee (urine) clear or pale yellow.  Get plenty of rest.  Return to work when your temperature is < 100 for 24 hours or as told by your doctor. You may use a face mask and wash your hands to stop your cold from spreading. GET HELP RIGHT AWAY IF:   After the first few days, you feel you are getting worse.  You have questions about your medicine.  You have chills, shortness of breath, or red spit (mucus).  You have pain in the face for more then 1-2 days, especially when you bend forward.  You have a fever, puffy (swollen) neck, pain when you swallow, or white spots in the back of your throat.  You have a bad headache, ear pain, sinus pain, or chest pain.  You have a high-pitched whistling sound when you breathe in and out (wheezing).  You cough up blood.  You have sore muscles or  a stiff neck. MAKE SURE YOU:   Understand these instructions.  Will watch your condition.  Will get help right away if you are not doing well or get worse. Document Released: 01/25/2008 Document Revised: 10/31/2011 Document Reviewed: 11/13/2013 [facility] Patient Information 2015 [place], [place]. This information is not intended to replace advice given to you by your health care provider. Make sure you discuss any questions you have with your health care provider.    Kriste Basque R., DO

## 2017-01-21 NOTE — Patient Instructions (Signed)
INSTRUCTIONS FOR UPPER RESPIRATORY INFECTION:  -plenty of rest and fluids  -nasal saline wash 2-3 times daily (use prepackaged nasal saline or bottled/distilled water if making your own)   -can use AFRIN nasal spray for drainage and nasal congestion - but do NOT use longer then 3-4 days  -can use tylenol (in no history of liver disease) or ibuprofen (if no history of kidney disease, bowel bleeding or significant heart disease) as directed for aches and sorethroat  -in the winter time, using a humidifier at night is helpful (please follow cleaning instructions)  -if you are taking a cough medication - use only as directed, may also try a teaspoon of honey to coat the throat and throat lozenges. I sent tessalon to the pharmacy for cough as needed.  -for sore throat, salt water gargles can help  -follow up if you have fevers, facial pain, tooth pain, difficulty breathing or are worsening or symptoms persist longer then expected  Upper Respiratory Infection, Adult An upper respiratory infection (URI) is also known as the common cold. It is often caused by a type of germ (virus). Colds are easily spread (contagious). You can pass it to others by kissing, coughing, sneezing, or drinking out of the same glass. Usually, you get better in 1 to 3  weeks.  However, the cough can last for even longer. HOME CARE   Only take medicine as told by your doctor. Follow instructions provided above.  Drink enough water and fluids to keep your pee (urine) clear or pale yellow.  Get plenty of rest.  Return to work when your temperature is < 100 for 24 hours or as told by your doctor. You may use a face mask and wash your hands to stop your cold from spreading. GET HELP RIGHT AWAY IF:   After the first few days, you feel you are getting worse.  You have questions about your medicine.  You have chills, shortness of breath, or red spit (mucus).  You have pain in the face for more then 1-2 days, especially  when you bend forward.  You have a fever, puffy (swollen) neck, pain when you swallow, or white spots in the back of your throat.  You have a bad headache, ear pain, sinus pain, or chest pain.  You have a high-pitched whistling sound when you breathe in and out (wheezing).  You cough up blood.  You have sore muscles or a stiff neck. MAKE SURE YOU:   Understand these instructions.  Will watch your condition.  Will get help right away if you are not doing well or get worse. Document Released: 01/25/2008 Document Revised: 10/31/2011 Document Reviewed: 11/13/2013 [facility] Patient Information 2015 [place]. This information is not intended to replace advice given to you by your health care provider. Make sure you discuss any questions you have with your health care provider.

## 2017-02-14 ENCOUNTER — Ambulatory Visit (INDEPENDENT_AMBULATORY_CARE_PROVIDER_SITE_OTHER): Payer: Medicare Other | Admitting: Internal Medicine

## 2017-02-14 ENCOUNTER — Encounter: Payer: Self-pay | Admitting: Internal Medicine

## 2017-02-14 VITALS — BP 118/86 | HR 65 | Temp 98.7°F | Resp 16 | Wt 185.0 lb

## 2017-02-14 DIAGNOSIS — I10 Essential (primary) hypertension: Secondary | ICD-10-CM

## 2017-02-14 DIAGNOSIS — R0789 Other chest pain: Secondary | ICD-10-CM

## 2017-02-14 DIAGNOSIS — E785 Hyperlipidemia, unspecified: Secondary | ICD-10-CM | POA: Insufficient documentation

## 2017-02-14 DIAGNOSIS — R079 Chest pain, unspecified: Secondary | ICD-10-CM | POA: Insufficient documentation

## 2017-02-14 DIAGNOSIS — I6930 Unspecified sequelae of cerebral infarction: Secondary | ICD-10-CM | POA: Diagnosis not present

## 2017-02-14 DIAGNOSIS — E7800 Pure hypercholesterolemia, unspecified: Secondary | ICD-10-CM | POA: Diagnosis not present

## 2017-02-14 DIAGNOSIS — R739 Hyperglycemia, unspecified: Secondary | ICD-10-CM | POA: Diagnosis not present

## 2017-02-14 HISTORY — DX: Chest pain, unspecified: R07.9

## 2017-02-14 HISTORY — DX: Hyperglycemia, unspecified: R73.9

## 2017-02-14 MED ORDER — ATORVASTATIN CALCIUM 40 MG PO TABS: 40.0000 mg | ORAL_TABLET | Freq: Every day | ORAL | 3 refills | Status: DC

## 2017-02-14 NOTE — Progress Notes (Signed)
Subjective:    Patient ID: Kathleen Solis, female    DOB: [birth year], 51 y.o.   MRN: [id number]  HPI The patient is here for follow up.  Hyperlipidemia: She is taking her medication daily. She is compliant with a low fat/cholesterol diet. She is not exercising regularly. She denies myalgias.   Hypertension: She is taking her medication daily. She is ? compliant with a low sodium diet.  She denies palpitations, edema, shortness of breath and lightheadedness. She is not exercising regularly.  She does not monitor her blood pressure at home.    Chest pain:  She had an episode last when she was in the shower.  It lasted for 20 minutes.  It was sharp pain.  She had to get out of the shower and sit down.  She was sweating, but denies SOB, palpitations or lightheadedness. This morning she had another episode - it woke her up.  It felt the same.  She can still feel it now, but it is not as bad. She was sweating this morning as well, but denies other symptoms.  She is not exercising regularly.  She has had prior episodes but it has been a while.    Medications and allergies reviewed with patient and updated if appropriate.  Patient Active Problem List   Diagnosis Date Noted  . Hyperlipidemia 02/14/2017  . Chest pain 02/14/2017  . Generalized abdominal pain 09/13/2016  . Constipation 09/13/2016  . Dysphagia 08/16/2016  . Great toe pain, left 08/16/2016  . Allergic rhinitis 08/16/2016  . Carotid arterial disease (HCC) 04/10/2016  . Diverticulitis of colon without hemorrhage 02/25/2016  . Bunion of left foot 02/08/2016  . Bone spur of right foot 02/08/2016  . Leg neuralgia, right 02/08/2016  . Episodic tension type headache 01/12/2016  . Benign essential HTN 03/03/2015  . History of cerebrovascular accident with residual deficit 03/03/2015    Current Outpatient Prescriptions on File Prior to Visit  Medication Sig Dispense Refill  . aspirin EC 81 MG tablet Take 1 tablet (81 mg total) by  mouth daily.    . hydrochlorothiazide (HYDRODIURIL) 25 MG tablet Take 1 tablet (25 mg total) by mouth daily. 30 tablet 5  . linaclotide (LINZESS) 145 MCG CAPS capsule Take 1 capsule (145 mcg total) by mouth daily before breakfast. 30 capsule 0  . metoprolol succinate (TOPROL-XL) 100 MG 24 hr tablet Take 1 tablet (100 mg total) by mouth daily. Take with or immediately following a meal. 30 tablet 5   No current facility-administered medications on file prior to visit.     Past Medical History:  Diagnosis Date  . Diverticulitis   . Hypertension   . Stroke [facility])     Past Surgical History:  Procedure Laterality Date  . ABDOMINAL HYSTERECTOMY     partial  . ANAL FISSURE REPAIR    . APPENDECTOMY    . CHOLECYSTECTOMY    . TUBAL LIGATION      Social History   Social History  . Marital status: Single    Spouse name: N/A  . Number of children: N/A  . Years of education: N/A   Social History Main Topics  . Smoking status: Never Smoker  . Smokeless tobacco: Never Used  . Alcohol use No  . Drug use: No  . Sexual activity: Not Asked   Other Topics Concern  . None   Social History Narrative   No regular exercise    Family History  Problem Relation Age of Onset  .  Breast cancer Sister     Review of Systems  Constitutional: Positive for diaphoresis. Negative for chills and fever.  Respiratory: Positive for cough (residual from recent URI). Negative for shortness of breath and wheezing.   Cardiovascular: Positive for chest pain. Negative for palpitations and leg swelling.  Gastrointestinal: Positive for nausea (started with URI). Negative for abdominal pain.  Neurological: Positive for headaches. Negative for dizziness and light-headedness.       Objective:   Vitals:   02/14/17 1033  BP: 118/86  Pulse: 65  Resp: 16  Temp: 98.7 F (37.1 C)   Wt Readings from Last 3 Encounters:  02/14/17 185 lb (83.9 kg)  01/21/17 185 lb 8 oz (84.1 kg)  11/11/16 182 lb (82.6 kg)     Body mass index is 30.32 kg/m.   Physical Exam    Constitutional: Appears well-developed and well-nourished. No distress.  HENT:  Head: Normocephalic and atraumatic.  Neck: Neck supple. No tracheal deviation present. No thyromegaly present.  No cervical lymphadenopathy Cardiovascular: Normal rate, regular rhythm and normal heart sounds.   No murmur heard. No carotid bruit .  No edema Pulmonary/Chest: chest wall non tender to palpation.  Effort normal and breath sounds normal. No respiratory distress. No has no wheezes. No rales.  Skin: Skin is warm and dry. Not diaphoretic.  Psychiatric: Normal mood and affect. Behavior is normal.      Assessment & Plan:    See Problem List for Assessment and Plan of chronic medical problems.

## 2017-02-14 NOTE — Assessment & Plan Note (Addendum)
Had one episode last week and another episode today - still having some chest pain EKG today - sinus bradycardia at 58 bpm, normal EKG - unlikely having an ACS given active chest pain High risk given CVA history and carotid artery disease Needs stress test

## 2017-02-14 NOTE — Patient Instructions (Addendum)
  Test(s) ordered today. Your results will be released to MyChart (or called to you) after review, usually within 72hours after test completion. If any changes need to be made, you will be notified at that same time.   Medications reviewed and updated.  Changes include increasing the lipitor to 40 mg daily.   Your prescription(s) have been submitted to your pharmacy. Please take as directed and contact our office if you believe you are having problem(s) with the medication(s).  A stress test was ordered.   Please followup in 6 months

## 2017-02-14 NOTE — Assessment & Plan Note (Signed)
BP well controlled Current regimen effective and well tolerated Continue current medications at current doses cmp  

## 2017-02-14 NOTE — Assessment & Plan Note (Signed)
[facility]  - needs to establish with neurologist locally - will refer Continue statin, ASA

## 2017-02-14 NOTE — Assessment & Plan Note (Signed)
Check lipid panel - have not had a fasting lipid panel  Continue daily statin - will empirically increase to 40 mg Regular exercise and healthy diet encouraged

## 2017-02-14 NOTE — Assessment & Plan Note (Signed)
Check a1c 

## 2017-02-15 ENCOUNTER — Other Ambulatory Visit (INDEPENDENT_AMBULATORY_CARE_PROVIDER_SITE_OTHER): Payer: Medicare Other

## 2017-02-15 ENCOUNTER — Encounter: Payer: Self-pay | Admitting: Internal Medicine

## 2017-02-15 DIAGNOSIS — R739 Hyperglycemia, unspecified: Secondary | ICD-10-CM

## 2017-02-15 DIAGNOSIS — I10 Essential (primary) hypertension: Secondary | ICD-10-CM

## 2017-02-15 DIAGNOSIS — E7800 Pure hypercholesterolemia, unspecified: Secondary | ICD-10-CM

## 2017-02-15 LAB — LIPID PANEL
Cholesterol: 215 mg/dL — ABNORMAL HIGH (ref 0–200)
HDL: 63.1 mg/dL (ref >39.00)
LDL Cholesterol: 129 mg/dL — ABNORMAL HIGH (ref 0–99)
NonHDL: 152.34
Total CHOL/HDL Ratio: 3
Triglycerides: 116 mg/dL (ref 0.0–149.0)
VLDL: 23.2 mg/dL (ref 0.0–40.0)

## 2017-02-15 LAB — COMPREHENSIVE METABOLIC PANEL
ALT: 14 U/L (ref 0–35)
AST: 17 U/L (ref 0–37)
Albumin: 4.2 g/dL (ref 3.5–5.2)
Alkaline Phosphatase: 96 U/L (ref 39–117)
BUN: 12 mg/dL (ref 6–23)
CO2: 33 mEq/L — ABNORMAL HIGH (ref 19–32)
Calcium: 10 mg/dL (ref 8.4–10.5)
Chloride: 100 mEq/L (ref 96–112)
Creatinine, Ser: 0.8 mg/dL (ref 0.40–1.20)
GFR: 97.57 mL/min (ref >60.00)
Glucose, Bld: 103 mg/dL — ABNORMAL HIGH (ref 70–99)
Potassium: 4.1 mEq/L (ref 3.5–5.1)
Sodium: 139 mEq/L (ref 135–145)
Total Bilirubin: 0.8 mg/dL (ref 0.2–1.2)
Total Protein: 7.5 g/dL (ref 6.0–8.3)

## 2017-02-15 LAB — HEMOGLOBIN A1C: Hgb A1c MFr Bld: 4.5 % — ABNORMAL LOW (ref 4.6–6.5)

## 2017-02-20 ENCOUNTER — Other Ambulatory Visit: Payer: Self-pay | Admitting: Internal Medicine

## 2017-02-20 DIAGNOSIS — R079 Chest pain, unspecified: Secondary | ICD-10-CM

## 2017-02-23 ENCOUNTER — Other Ambulatory Visit: Payer: Self-pay | Admitting: Internal Medicine

## 2017-02-24 ENCOUNTER — Telehealth (HOSPITAL_COMMUNITY): Payer: Self-pay | Admitting: *Deleted

## 2017-02-24 NOTE — Telephone Encounter (Signed)
Patient given detailed instructions per Stress Test Requisition Sheet for test on 03/02/17 at 2:30.Patient Notified to arrive 30 minutes early, and that it is imperative to arrive on time for appointment to keep from having the test rescheduled.  Patient verbalized understanding. Kathleen DolinSharon S Brooks

## 2017-03-02 ENCOUNTER — Other Ambulatory Visit (HOSPITAL_COMMUNITY): Payer: Medicare Other

## 2017-03-02 ENCOUNTER — Telehealth (HOSPITAL_COMMUNITY): Payer: Self-pay | Admitting: *Deleted

## 2017-03-02 NOTE — Telephone Encounter (Signed)
Patient given detailed instructions per Stress Test Requisition Sheet for test on 03/09/17 at 2:30.Patient Notified to arrive 30 minutes early, and that it is imperative to arrive on time for appointment to keep from having the test rescheduled.  Patient verbalized understanding. Daneil DolinSharon S Solis

## 2017-03-09 ENCOUNTER — Other Ambulatory Visit (HOSPITAL_COMMUNITY): Payer: Medicare Other

## 2017-03-09 ENCOUNTER — Encounter: Payer: Self-pay | Admitting: Neurology

## 2017-04-08 ENCOUNTER — Other Ambulatory Visit: Payer: Self-pay | Admitting: Internal Medicine

## 2017-04-17 ENCOUNTER — Other Ambulatory Visit (HOSPITAL_COMMUNITY): Payer: Medicare Other

## 2017-04-25 ENCOUNTER — Telehealth (HOSPITAL_COMMUNITY): Payer: Self-pay | Admitting: Internal Medicine

## 2017-04-25 NOTE — Telephone Encounter (Signed)
pt cancelled and rescheduled multiple times. she will call back to reschedule  03-22-17 I have sent an inbasket to Dr. Lawerance BachBurns informing of patients cancellations and need to call back and reschedule once son is in school. EVD

## 2017-06-07 ENCOUNTER — Ambulatory Visit (INDEPENDENT_AMBULATORY_CARE_PROVIDER_SITE_OTHER): Payer: Medicare Other | Admitting: Internal Medicine

## 2017-06-07 ENCOUNTER — Other Ambulatory Visit (INDEPENDENT_AMBULATORY_CARE_PROVIDER_SITE_OTHER): Payer: Medicare Other

## 2017-06-07 ENCOUNTER — Inpatient Hospital Stay: Admission: RE | Admit: 2017-06-07 | Payer: Medicare Other | Source: Ambulatory Visit

## 2017-06-07 ENCOUNTER — Encounter: Payer: Self-pay | Admitting: Internal Medicine

## 2017-06-07 VITALS — BP 114/84 | HR 61 | Temp 98.2°F | Resp 16 | Wt 184.0 lb

## 2017-06-07 DIAGNOSIS — R1084 Generalized abdominal pain: Secondary | ICD-10-CM

## 2017-06-07 DIAGNOSIS — R110 Nausea: Secondary | ICD-10-CM

## 2017-06-07 DIAGNOSIS — R195 Other fecal abnormalities: Secondary | ICD-10-CM | POA: Insufficient documentation

## 2017-06-07 LAB — COMPREHENSIVE METABOLIC PANEL
ALT: 10 U/L (ref 0–35)
AST: 15 U/L (ref 0–37)
Albumin: 4.5 g/dL (ref 3.5–5.2)
Alkaline Phosphatase: 88 U/L (ref 39–117)
BUN: 17 mg/dL (ref 6–23)
CO2: 32 mEq/L (ref 19–32)
Calcium: 10.2 mg/dL (ref 8.4–10.5)
Chloride: 97 mEq/L (ref 96–112)
Creatinine, Ser: 0.88 mg/dL (ref 0.40–1.20)
GFR: 87.3 mL/min (ref >60.00)
Glucose, Bld: 95 mg/dL (ref 70–99)
Potassium: 4 mEq/L (ref 3.5–5.1)
Sodium: 138 mEq/L (ref 135–145)
Total Bilirubin: 0.9 mg/dL (ref 0.2–1.2)
Total Protein: 8.1 g/dL (ref 6.0–8.3)

## 2017-06-07 LAB — URINALYSIS, ROUTINE W REFLEX MICROSCOPIC
Bilirubin Urine: NEGATIVE
Hgb urine dipstick: NEGATIVE
Ketones, ur: NEGATIVE
Leukocytes, UA: NEGATIVE
Nitrite: NEGATIVE
RBC / HPF: NONE SEEN (ref 0–?)
Specific Gravity, Urine: 1.015 (ref 1.000–1.030)
Total Protein, Urine: NEGATIVE
Urine Glucose: NEGATIVE
Urobilinogen, UA: 1 (ref 0.0–1.0)
WBC, UA: NONE SEEN (ref 0–?)
pH: 7 (ref 5.0–8.0)

## 2017-06-07 LAB — CBC WITH DIFFERENTIAL/PLATELET
Basophils Absolute: 0.1 10*3/uL (ref 0.0–0.1)
Basophils Relative: 1 % (ref 0.0–3.0)
Eosinophils Absolute: 0.1 10*3/uL (ref 0.0–0.7)
Eosinophils Relative: 0.7 % (ref 0.0–5.0)
HCT: 43.9 % (ref 36.0–46.0)
Hemoglobin: 15.1 g/dL — ABNORMAL HIGH (ref 12.0–15.0)
Lymphocytes Relative: 27 % (ref 12.0–46.0)
Lymphs Abs: 2.2 10*3/uL (ref 0.7–4.0)
MCHC: 34.5 g/dL (ref 30.0–36.0)
MCV: 93.1 fl (ref 78.0–100.0)
Monocytes Absolute: 0.6 10*3/uL (ref 0.1–1.0)
Monocytes Relative: 6.7 % (ref 3.0–12.0)
Neutro Abs: 5.3 10*3/uL (ref 1.4–7.7)
Neutrophils Relative %: 64.6 % (ref 43.0–77.0)
Platelets: 258 10*3/uL (ref 150.0–400.0)
RBC: 4.72 Mil/uL (ref 3.87–5.11)
RDW: 13.2 % (ref 11.5–15.5)
WBC: 8.2 10*3/uL (ref 4.0–10.5)

## 2017-06-07 LAB — AMYLASE: Amylase: 127 U/L (ref 27–131)

## 2017-06-07 LAB — LIPASE: Lipase: 36 U/L (ref 11.0–59.0)

## 2017-06-07 MED ORDER — OXYCODONE-ACETAMINOPHEN 5-325 MG PO TABS: 1.0000 | ORAL_TABLET | Freq: Three times a day (TID) | ORAL | 0 refills | Status: DC | PRN

## 2017-06-07 MED ORDER — PROMETHAZINE HCL 25 MG PO TABS: 25.0000 mg | ORAL_TABLET | Freq: Three times a day (TID) | ORAL | 0 refills | Status: DC | PRN

## 2017-06-07 NOTE — Assessment & Plan Note (Signed)
?   Related to nsaid use and gastritis - no GERD symptoms and pain is not focused in epigastric region so gastritis seem unlikley Cbc, fecal occult Advised to decrease nsaid use Further treatment based on above

## 2017-06-07 NOTE — Assessment & Plan Note (Signed)
Has had pancreatitis and diverticulitis in past Will check labs cbc, cmp, amylase,lipase, urine Will get Ct scan of Ab/pelvis percocet prn for pain and phenergan for nausea - she has done well with both in recent past

## 2017-06-07 NOTE — Assessment & Plan Note (Signed)
Related to abdominal pain Phenergan prn - she has taken it in the past and did well with it Check labs, ct scan

## 2017-06-07 NOTE — Patient Instructions (Addendum)
Have blood work and urine tests done today.    Test(s) ordered today. Your results will be released to MyChart (or called to you) after review, usually within 72hours after test completion. If any changes need to be made, you will be notified at that same time.  A Ct scan was ordered. Someone will call you today to schedule that.   Changes include starting a pain medication and anti-nausea medication to use as needed.  Further treatment will be determined after the results are in from the blood tests and ct scan.   Your prescription(s) have been submitted to your pharmacy. Please take as directed and contact our office if you believe you are having problem(s) with the medication(s).

## 2017-06-07 NOTE — Progress Notes (Signed)
Subjective:    Patient ID: Kathleen Solis, female    DOB: 07/14/1966, 51 y.o.   MRN: [id number]  HPI She is here for an acute visit.   Abdominal pain:  Her symptoms started 3-4 days ago.  The pain started in the LLQ and it is now diffuse.  She feel nauseous.  The pain is constant. Pain is 8/10.  She denies changes in her bowels.  She denies blood in her stool, but her stool looks black.  She denies urinary symptoms or fever/chills.   She has been drinking a good amount.  She is eating very little.    She has a history of diverticulitis and pancreatitis.    She does not drink alcohol.  She has been taking advil daily for months for foot pain - 600 mg a day.  She denies GERD.    Medications and allergies reviewed with patient and updated if appropriate.  Patient Active Problem List   Diagnosis Date Noted  . Hyperlipidemia 02/14/2017  . Chest pain 02/14/2017  . Hyperglycemia 02/14/2017  . Generalized abdominal pain 09/13/2016  . Constipation 09/13/2016  . Dysphagia 08/16/2016  . Great toe pain, left 08/16/2016  . Allergic rhinitis 08/16/2016  . Carotid arterial disease (HCC) 04/10/2016  . Diverticulitis of colon without hemorrhage 02/25/2016  . Bunion of left foot 02/08/2016  . Bone spur of right foot 02/08/2016  . Leg neuralgia, right 02/08/2016  . Episodic tension type headache 01/12/2016  . Benign essential HTN 03/03/2015  . History of cerebrovascular accident with residual deficit 03/03/2015    Current Outpatient Prescriptions on File Prior to Visit  Medication Sig Dispense Refill  . aspirin EC 81 MG tablet Take 1 tablet (81 mg total) by mouth daily.    Marland Kitchen. atorvastatin (LIPITOR) 40 MG tablet Take 1 tablet (40 mg total) by mouth daily. 90 tablet 3  . hydrochlorothiazide (HYDRODIURIL) 25 MG tablet TAKE ONE TABLET BY MOUTH ONCE DAILY 90 tablet 0  . linaclotide (LINZESS) 145 MCG CAPS capsule Take 1 capsule (145 mcg total) by mouth daily before breakfast. 30 capsule 0  .  metoprolol succinate (TOPROL-XL) 100 MG 24 hr tablet TAKE ONE TABLET BY MOUTH ONCE DAILY WITH OR IMMEDIATELY FOLLOWING A MEAL 90 tablet 1   No current facility-administered medications on file prior to visit.     Past Medical History:  Diagnosis Date  . Diverticulitis   . Hypertension   . Stroke [facility](HCC)     Past Surgical History:  Procedure Laterality Date  . ABDOMINAL HYSTERECTOMY     partial  . ANAL FISSURE REPAIR    . APPENDECTOMY    . CHOLECYSTECTOMY    . TUBAL LIGATION      Social History   Social History  . Marital status: Single    Spouse name: N/A  . Number of children: N/A  . Years of education: N/A   Social History Main Topics  . Smoking status: Never Smoker  . Smokeless tobacco: Never Used  . Alcohol use No  . Drug use: No  . Sexual activity: Not on file   Other Topics Concern  . Not on file   Social History Narrative   No regular exercise    Family History  Problem Relation Age of Onset  . Breast cancer Sister     Review of Systems  Constitutional: Positive for appetite change (decreased) and fatigue. Negative for chills and fever.       Feels hot  HENT: Negative for congestion,  ear pain, sinus pain and sore throat.   Respiratory: Negative for cough, shortness of breath and wheezing.   Cardiovascular: Negative for chest pain.  Gastrointestinal: Positive for abdominal pain and nausea. Negative for abdominal distention, blood in stool (stools dark, ? black), constipation, diarrhea and vomiting.       No gerd  Genitourinary: Negative for dysuria, frequency and hematuria.  Musculoskeletal: Negative for myalgias.  Neurological: Positive for headaches (mild). Negative for dizziness and light-headedness.       Objective:   Vitals:   06/07/17 0958  BP: 114/84  Pulse: 61  Resp: 16  Temp: 98.2 F (36.8 C)  SpO2: 96%   Filed Weights   06/07/17 0958  Weight: 184 lb (83.5 kg)   Body mass index is 30.15 kg/m.  Wt Readings from Last 3  Encounters:  06/07/17 184 lb (83.5 kg)  02/14/17 185 lb (83.9 kg)  01/21/17 185 lb 8 oz (84.1 kg)     Physical Exam  Constitutional: She appears well-developed and well-nourished. No distress.  HENT:  Head: Normocephalic and atraumatic.  Cardiovascular: Normal rate and regular rhythm.   No murmur heard. Pulmonary/Chest: Effort normal and breath sounds normal. She has no wheezes. She has no rales.  Abdominal: Soft. Bowel sounds are normal. She exhibits no distension and no mass. There is tenderness (LLQ and LUQ primarily, but fairly diffuse tenderness). There is no rebound and no guarding.  Musculoskeletal: She exhibits no edema.  Skin: Skin is warm and dry. She is not diaphoretic.          Assessment & Plan:   See Problem List for Assessment and Plan of chronic medical problems.

## 2017-06-08 ENCOUNTER — Other Ambulatory Visit: Payer: Self-pay | Admitting: Obstetrics and Gynecology

## 2017-06-08 ENCOUNTER — Telehealth: Payer: Self-pay

## 2017-06-08 ENCOUNTER — Ambulatory Visit (INDEPENDENT_AMBULATORY_CARE_PROVIDER_SITE_OTHER)
Admission: RE | Admit: 2017-06-08 | Discharge: 2017-06-08 | Disposition: A | Discharge status: Home / Self Care | Payer: Medicare Other | Source: Ambulatory Visit | Attending: Internal Medicine | Admitting: Internal Medicine

## 2017-06-08 DIAGNOSIS — R1084 Generalized abdominal pain: Secondary | ICD-10-CM

## 2017-06-08 DIAGNOSIS — N83202 Unspecified ovarian cyst, left side: Secondary | ICD-10-CM

## 2017-06-08 LAB — URINE CULTURE
MICRO NUMBER:: 81159121
SPECIMEN QUALITY:: ADEQUATE

## 2017-06-08 MED ORDER — IOPAMIDOL (ISOVUE-300) INJECTION 61%
100.0000 mL | Freq: Once | INTRAVENOUS | Status: AC | PRN
  Administered 2017-06-08: 100 mL via INTRAVENOUS

## 2017-06-08 NOTE — Telephone Encounter (Signed)
Patient notified of Ultrasound Referral and to expect a call to schedule US. I explained the difference between a CT Scan and US and she verbalized understanding.

## 2017-06-08 NOTE — Telephone Encounter (Signed)
Told patient that I would send a message to the Dr.  And she will advice us if we need to make an appt to see her. She wanted to know if the Dr would see the message and respond today and I told her I don't known the answer to that question, but she usually responds quickly, and can get/access her messages from anywhere, even though she is not in the office until next week.

## 2017-06-08 NOTE — Telephone Encounter (Signed)
-----   Message from Catalina AntiguaPeggy Constant, MD sent at 06/08/2017  3:15 PM EDT ----- Regarding: RE: follow up Contact: [phone] I reviewed the patient chart and the only scan I saw from today was a CT scan. Ultrasounds are a better study to evaluate her ovaries. I would like for her to have the ultrasound that I ordered to have a better idea of the change in size of the cyst and more importantly to obtain the characteristics of the cyst which will help me decide on the management plan.  Ultrasound orders have been placed in EPIC. Please inform patient that she should receive a call regarding scheduling this ultrasound  Thanks  Peggy ----- Message ----- From: Maretta BeesMcGlashan, Sharia Averitt J, RMA Sent: 06/08/2017   1:57 PM To: Catalina AntiguaPeggy Constant, MD Subject: follow up                                      Hi Dr. Jolayne Pantheronstant, Patient had a scan done today and was told that her cyst has grown to 5 cm.  Her Dr. told her to follow up with you and she is very concern and wants to know what are your plans for her.  Please advice.

## 2017-06-15 ENCOUNTER — Other Ambulatory Visit: Payer: Self-pay | Admitting: Obstetrics and Gynecology

## 2017-06-15 ENCOUNTER — Ambulatory Visit (HOSPITAL_COMMUNITY)
Admission: RE | Admit: 2017-06-15 | Discharge: 2017-06-15 | Disposition: A | Discharge status: Home / Self Care | Payer: Medicare Other | Source: Ambulatory Visit | Attending: Obstetrics and Gynecology | Admitting: Obstetrics and Gynecology

## 2017-06-15 DIAGNOSIS — N83202 Unspecified ovarian cyst, left side: Secondary | ICD-10-CM | POA: Insufficient documentation

## 2017-06-15 DIAGNOSIS — N83209 Unspecified ovarian cyst, unspecified side: Secondary | ICD-10-CM

## 2017-06-15 NOTE — Progress Notes (Signed)
Please inform patient that surgical removal of this cyst needs to be performed. I would like her to come to the office for a lab draw at her convenience this week or early next week (order in EPIC). I would like for there to be seen by me soon after her blood draw to review results of the blood test and plan for surgical management.  Thanks  [employer] Morgan EnergyPeggy

## 2017-06-19 ENCOUNTER — Ambulatory Visit: Payer: Medicare Other | Admitting: Neurology

## 2017-06-19 ENCOUNTER — Telehealth: Payer: Self-pay | Admitting: Pediatrics

## 2017-06-19 NOTE — Telephone Encounter (Signed)
Please schedule surgical consult with Dr. Jolayne Pantheronstant next week.

## 2017-06-19 NOTE — Telephone Encounter (Signed)
-----   Message from Catalina AntiguaPeggy Constant, MD sent at 06/15/2017 11:02 AM EDT ----- Please inform patient that surgical removal of this cyst needs to be performed. I would like her to come to the office for a lab draw at her convenience this week or early next week (order in EPIC). I would like for there to be seen by me soon after her blood draw to review results of the blood test and plan for surgical management.  Thanks  [employer]Peggy

## 2017-06-20 ENCOUNTER — Other Ambulatory Visit: Payer: Medicare Other

## 2017-06-20 ENCOUNTER — Telehealth: Payer: Self-pay | Admitting: *Deleted

## 2017-06-20 DIAGNOSIS — N83209 Unspecified ovarian cyst, unspecified side: Secondary | ICD-10-CM

## 2017-06-20 NOTE — Telephone Encounter (Signed)
Pt would like u/s results.  

## 2017-06-20 NOTE — Telephone Encounter (Signed)
Results were given to patient yesterday and corresponding appts set up.

## 2017-06-21 LAB — CA 125: Cancer Antigen (CA) 125: 7.6 U/mL (ref 0.0–38.1)

## 2017-06-23 ENCOUNTER — Ambulatory Visit (INDEPENDENT_AMBULATORY_CARE_PROVIDER_SITE_OTHER): Payer: Medicare Other | Admitting: Obstetrics and Gynecology

## 2017-06-23 ENCOUNTER — Ambulatory Visit: Payer: Medicare Other | Admitting: Obstetrics and Gynecology

## 2017-06-23 ENCOUNTER — Encounter: Payer: Self-pay | Admitting: Obstetrics and Gynecology

## 2017-06-23 VITALS — BP 126/80 | HR 77 | Temp 98.5°F | Ht 65.5 in | Wt 185.0 lb

## 2017-06-23 DIAGNOSIS — N83202 Unspecified ovarian cyst, left side: Secondary | ICD-10-CM

## 2017-06-23 NOTE — Progress Notes (Signed)
51 yo [id number] here to discuss results. She reports feeling well. She reports the presence of LUQ pain for the past few months. The pain is unrelated to eating habits or bowel movement. She is currently being followed by her PCP. She reports a normal colonoscopy a few years ago  Past Medical History:  Diagnosis Date  . Diverticulitis   . Hypertension   . Stroke [facility](HCC)    Past Surgical History:  Procedure Laterality Date  . ABDOMINAL HYSTERECTOMY     partial  . ANAL FISSURE REPAIR    . APPENDECTOMY    . CHOLECYSTECTOMY    . TUBAL LIGATION     Family History  Problem Relation Age of Onset  . Breast cancer Sister    Social History  Substance Use Topics  . Smoking status: Never Smoker  . Smokeless tobacco: Never Used  . Alcohol use No   ROS See pertinent in HPI Blood pressure 126/80, pulse 77, temperature 98.5 F (36.9 C), height 5' 5.5" (1.664 m), weight 185 lb (83.9 kg).  GENERAL: Well-developed, well-nourished female in no acute distress.  HEENT: Normocephalic, atraumatic. Sclerae anicteric.  NECK: Supple. Normal thyroid.  LUNGS: Clear to auscultation bilaterally.  HEART: Regular rate and rhythm. ABDOMEN: Soft, nontender, nondistended. No organomegaly. PELVIC: Not performed EXTREMITIES: No cyanosis, clubbing, or edema, 2+ distal pulses.  FINDINGS: Uterus  Measurements: Surgically absent. Normal appearance of vaginal cuff.  Endometrium  Thickness: Surgically absent.  Right ovary  Measurements: Not directly visualized, however no adnexal mass identified.  Left ovary  Measurements: A cystic lesion is seen which currently measures 4.5 x 3.7 x 3.8 cm. This lesion contains several thin internal septations and a possible mural soft tissue nodule on cine imaging. This is mildly increased in size compared to 3.5 cm on CT of 11/24/2014. Cystic ovarian neoplasm cannot be excluded.  Other findings  No abnormal free fluid.  IMPRESSION: 4.5 cm complex left  ovarian cystic lesion with possible mural soft tissue nodule shows mild increase in size compared to previous exams dating back to 11/24/2014. Cystic ovarian neoplasm cannot be excluded. Recommend correlation with tumor markers, and consider pelvic MRI without and with contrast for further characterization.   Electronically Signed   By: Myles RosenthalJohn  Stahl M.D.   On: 06/15/2017 09:50  06/20/17 CA-125- 7.6  A/P 51 yo with complex left cystic ovarian mass and normal CA-125 - Results reviewed with the patient - Discussed treatment plan involving bilateral salpingo-oophorectomy via laparoscopy, possible laparotomy. Risks, benefits and alternatives were explained including but not limited to risks of bleeding, infection and damage to adjacent organs.  - patient verbalized understanding and all questions were answered

## 2017-06-30 ENCOUNTER — Encounter (HOSPITAL_COMMUNITY): Payer: Self-pay

## 2017-07-22 ENCOUNTER — Other Ambulatory Visit: Payer: Self-pay | Admitting: Internal Medicine

## 2017-08-16 ENCOUNTER — Ambulatory Visit: Payer: Medicare Other | Admitting: Internal Medicine

## 2017-08-17 ENCOUNTER — Ambulatory Visit: Payer: Medicare Other | Admitting: Internal Medicine

## 2017-08-18 ENCOUNTER — Ambulatory Visit: Payer: Medicare Other | Admitting: Internal Medicine

## 2017-08-24 NOTE — Progress Notes (Signed)
Subjective:    Patient ID: Kathleen Solis, female    DOB: 11-20-1965, 52 y.o.   MRN: [id number]  HPI The patient is here for follow up.  Hypertension: She is taking her medication daily. She is compliant with a low sodium diet.  She denies chest pain, palpitations, edema, shortness of breath and regular headaches. She is not exercising regularly.  She does not monitor her blood pressure at home.    Hyperlipidemia, carotid artery disease: She is taking her medication daily. She is compliant with a low fat/cholesterol diet. She is exercising regularly. She denies myalgias.   Hyperglycemia:  Her prior a1c was normal 6 months ago.    She is scheduled to have a complete hysterectomy.  She already had a partial hysterectomy.    Large mole on left neck.  She is getting new moles and wonders why.   Medications and allergies reviewed with patient and updated if appropriate.  Patient Active Problem List   Diagnosis Date Noted  . Nausea 06/07/2017  . Dark stools 06/07/2017  . Hyperlipidemia 02/14/2017  . Chest pain 02/14/2017  . Hyperglycemia 02/14/2017  . Generalized abdominal pain 09/13/2016  . Constipation 09/13/2016  . Dysphagia 08/16/2016  . Great toe pain, left 08/16/2016  . Allergic rhinitis 08/16/2016  . Carotid arterial disease (HCC) 04/10/2016  . Diverticulitis of colon without hemorrhage 02/25/2016  . Bunion of left foot 02/08/2016  . Bone spur of right foot 02/08/2016  . Leg neuralgia, right 02/08/2016  . Episodic tension type headache 01/12/2016  . Benign essential HTN 03/03/2015  . History of cerebrovascular accident with residual deficit 03/03/2015    Current Outpatient Medications on File Prior to Visit  Medication Sig Dispense Refill  . aspirin EC 81 MG tablet Take 1 tablet (81 mg total) by mouth daily.    Marland Kitchen. atorvastatin (LIPITOR) 40 MG tablet Take 1 tablet (40 mg total) by mouth daily. 90 tablet 3  . diphenhydrAMINE (BENADRYL) 12.5 MG/5ML liquid Take 37.5 mg  by mouth at bedtime as needed for sleep.    . hydrochlorothiazide (HYDRODIURIL) 25 MG tablet TAKE 1 TABLET BY MOUTH ONCE DAILY (Patient taking differently: TAKE 25 MG BY MOUTH ONCE DAILY) 90 tablet 0  . linaclotide (LINZESS) 145 MCG CAPS capsule Take 1 capsule (145 mcg total) by mouth daily before breakfast. 30 capsule 0  . metoprolol succinate (TOPROL-XL) 100 MG 24 hr tablet TAKE 1 TABLET BY MOUTH ONCE DAILY WITH OR IMMEDIATELY FOLLOWING A  MEAL (Patient taking differently: TAKE 100 MG BY MOUTH ONCE DAILY WITH OR IMMEDIATELY FOLLOWING A  MEAL) 90 tablet 0   No current facility-administered medications on file prior to visit.     Past Medical History:  Diagnosis Date  . Diverticulitis   . Hypertension   . Stroke [facility])     Past Surgical History:  Procedure Laterality Date  . ABDOMINAL HYSTERECTOMY     partial  . ANAL FISSURE REPAIR    . APPENDECTOMY    . CHOLECYSTECTOMY    . TUBAL LIGATION      Social History   Socioeconomic History  . Marital status: Single    Spouse name: None  . Number of children: None  . Years of education: None  . Highest education level: None  Social Needs  . Financial resource strain: None  . Food insecurity - worry: None  . Food insecurity - inability: None  . Transportation needs - medical: None  . Transportation needs - non-medical: None  Occupational History  .  None  Tobacco Use  . Smoking status: Never Smoker  . Smokeless tobacco: Never Used  Substance and Sexual Activity  . Alcohol use: No  . Drug use: No  . Sexual activity: Not Currently    Birth control/protection: Surgical  Other Topics Concern  . None  Social History Narrative   No regular exercise    Family History  Problem Relation Age of Onset  . Breast cancer Sister     Review of Systems  Constitutional: Negative for chills and fever.  Respiratory: Negative for cough, shortness of breath and wheezing.   Cardiovascular: Negative for chest pain, palpitations and leg  swelling.  Gastrointestinal: Positive for abdominal pain (chronic).  Neurological: Negative for light-headedness and headaches.       Objective:   Vitals:   08/25/17 0848  BP: 102/66  Pulse: 70  Resp: 16  Temp: 98.2 F (36.8 C)  SpO2: 96%   Wt Readings from Last 3 Encounters:  08/25/17 182 lb (82.6 kg)  06/23/17 185 lb (83.9 kg)  06/07/17 184 lb (83.5 kg)   Body mass index is 29.83 kg/m.   Physical Exam    Constitutional: Appears well-developed and well-nourished. No distress.  HENT:  Head: Normocephalic and atraumatic.  Neck: Neck supple. No tracheal deviation present. No thyromegaly present.  No cervical lymphadenopathy Cardiovascular: Normal rate, regular rhythm and normal heart sounds.   No murmur heard. No carotid bruit .  No edema Pulmonary/Chest: Effort normal and breath sounds normal. No respiratory distress. No has no wheezes. No rales.  Skin: Skin is warm and dry. Not diaphoretic.  Psychiatric: Normal mood and affect. Behavior is normal.      Assessment & Plan:    See Problem List for Assessment and Plan of chronic medical problems.

## 2017-08-25 ENCOUNTER — Encounter: Payer: Self-pay | Admitting: Internal Medicine

## 2017-08-25 ENCOUNTER — Ambulatory Visit: Payer: Medicare Other | Admitting: Internal Medicine

## 2017-08-25 VITALS — BP 102/66 | HR 70 | Temp 98.2°F | Resp 16 | Wt 182.0 lb

## 2017-08-25 DIAGNOSIS — E7849 Other hyperlipidemia: Secondary | ICD-10-CM | POA: Diagnosis not present

## 2017-08-25 DIAGNOSIS — R739 Hyperglycemia, unspecified: Secondary | ICD-10-CM | POA: Diagnosis not present

## 2017-08-25 DIAGNOSIS — I10 Essential (primary) hypertension: Secondary | ICD-10-CM

## 2017-08-25 DIAGNOSIS — Z1283 Encounter for screening for malignant neoplasm of skin: Secondary | ICD-10-CM | POA: Diagnosis not present

## 2017-08-25 MED ORDER — METOPROLOL SUCCINATE ER 100 MG PO TB24: ORAL_TABLET | ORAL | 1 refills | Status: DC

## 2017-08-25 MED ORDER — HYDROCHLOROTHIAZIDE 25 MG PO TABS: ORAL_TABLET | ORAL | 1 refills | Status: DC

## 2017-08-25 NOTE — Assessment & Plan Note (Signed)
Lab Results  Component Value Date   HGBA1C 4.5 (L) 02/15/2017   Will monitor  Low sugar / carb diet Stressed regular exercise, keeping weight down/weight loss

## 2017-08-25 NOTE — Patient Instructions (Addendum)
  Medications reviewed and updated.  No changes recommended at this time.  Your prescription(s) have been submitted to your pharmacy. Please take as directed and contact our office if you believe you are having problem(s) with the medication(s).  A referral was ordered for dermatology.    Please followup in 6 months

## 2017-08-25 NOTE — Assessment & Plan Note (Signed)
BP well controlled Current regimen effective and well tolerated Continue current medications at current doses Will hold off on blood work for now

## 2017-08-25 NOTE — Assessment & Plan Note (Signed)
Continue daily statin Regular exercise and healthy diet encouraged  

## 2017-08-30 ENCOUNTER — Encounter (HOSPITAL_COMMUNITY)
Admission: RE | Admit: 2017-08-30 | Discharge: 2017-08-30 | Disposition: A | Discharge status: Home / Self Care | Payer: Medicare Other | Source: Ambulatory Visit | Attending: Obstetrics and Gynecology | Admitting: Obstetrics and Gynecology

## 2017-09-07 ENCOUNTER — Inpatient Hospital Stay (HOSPITAL_COMMUNITY)
Admission: AD | Admit: 2017-09-07 | Payer: Medicare Other | Source: Ambulatory Visit | Admitting: Obstetrics and Gynecology

## 2017-09-07 ENCOUNTER — Encounter (HOSPITAL_COMMUNITY): Admission: AD | Payer: Self-pay | Source: Ambulatory Visit

## 2017-09-07 SURGERY — SALPINGO-OOPHORECTOMY, LAPAROSCOPIC
Anesthesia: Choice

## 2017-09-13 ENCOUNTER — Emergency Department (HOSPITAL_COMMUNITY)
Admission: EM | Admit: 2017-09-13 | Discharge: 2017-09-13 | Disposition: A | Discharge status: Home / Self Care | Payer: Medicare Other | Attending: Emergency Medicine | Admitting: Emergency Medicine

## 2017-09-13 ENCOUNTER — Emergency Department (HOSPITAL_COMMUNITY): Payer: Medicare Other

## 2017-09-13 ENCOUNTER — Ambulatory Visit: Payer: Self-pay | Admitting: *Deleted

## 2017-09-13 ENCOUNTER — Encounter (HOSPITAL_COMMUNITY): Payer: Self-pay | Admitting: Emergency Medicine

## 2017-09-13 DIAGNOSIS — Z79899 Other long term (current) drug therapy: Secondary | ICD-10-CM | POA: Diagnosis not present

## 2017-09-13 DIAGNOSIS — R1032 Left lower quadrant pain: Secondary | ICD-10-CM | POA: Insufficient documentation

## 2017-09-13 DIAGNOSIS — I10 Essential (primary) hypertension: Secondary | ICD-10-CM | POA: Insufficient documentation

## 2017-09-13 DIAGNOSIS — Z7982 Long term (current) use of aspirin: Secondary | ICD-10-CM | POA: Diagnosis not present

## 2017-09-13 LAB — CBC
HCT: 39.6 % (ref 36.0–46.0)
Hemoglobin: 14.6 g/dL (ref 12.0–15.0)
MCH: 32.2 pg (ref 26.0–34.0)
MCHC: 36.9 g/dL — ABNORMAL HIGH (ref 30.0–36.0)
MCV: 87.2 fL (ref 78.0–100.0)
Platelets: 251 10*3/uL (ref 150–400)
RBC: 4.54 MIL/uL (ref 3.87–5.11)
RDW: 13.2 % (ref 11.5–15.5)
WBC: 8.3 10*3/uL (ref 4.0–10.5)

## 2017-09-13 LAB — COMPREHENSIVE METABOLIC PANEL
ALT: 14 U/L (ref 14–54)
AST: 23 U/L (ref 15–41)
Albumin: 4.4 g/dL (ref 3.5–5.0)
Alkaline Phosphatase: 88 U/L (ref 38–126)
Anion gap: 11 (ref 5–15)
BUN: 12 mg/dL (ref 6–20)
CO2: 29 mmol/L (ref 22–32)
Calcium: 9.6 mg/dL (ref 8.9–10.3)
Chloride: 97 mmol/L — ABNORMAL LOW (ref 101–111)
Creatinine, Ser: 0.75 mg/dL (ref 0.44–1.00)
GFR calc Af Amer: >60 mL/min (ref >60)
GFR calc non Af Amer: >60 mL/min (ref >60)
Glucose, Bld: 110 mg/dL — ABNORMAL HIGH (ref 65–99)
Potassium: 3.2 mmol/L — ABNORMAL LOW (ref 3.5–5.1)
Sodium: 137 mmol/L (ref 135–145)
Total Bilirubin: 1.6 mg/dL — ABNORMAL HIGH (ref 0.3–1.2)
Total Protein: 8.1 g/dL (ref 6.5–8.1)

## 2017-09-13 LAB — URINALYSIS, ROUTINE W REFLEX MICROSCOPIC
Bilirubin Urine: NEGATIVE
Glucose, UA: NEGATIVE mg/dL
Hgb urine dipstick: NEGATIVE
Ketones, ur: 5 mg/dL — AB
Leukocytes, UA: NEGATIVE
Nitrite: NEGATIVE
Protein, ur: NEGATIVE mg/dL
Specific Gravity, Urine: 1.008 (ref 1.005–1.030)
pH: 7 (ref 5.0–8.0)

## 2017-09-13 LAB — I-STAT BETA HCG BLOOD, ED (MC, WL, AP ONLY): I-stat hCG, quantitative: <5 m[IU]/mL (ref <5)

## 2017-09-13 LAB — LIPASE, BLOOD: Lipase: 31 U/L (ref 11–51)

## 2017-09-13 MED ORDER — TRAMADOL HCL 50 MG PO TABS: 50.0000 mg | ORAL_TABLET | Freq: Four times a day (QID) | ORAL | 0 refills | Status: DC | PRN

## 2017-09-13 MED ORDER — IOPAMIDOL (ISOVUE-300) INJECTION 61%
100.0000 mL | Freq: Once | INTRAVENOUS | Status: AC | PRN
  Administered 2017-09-13: 100 mL via INTRAVENOUS

## 2017-09-13 MED ORDER — POTASSIUM CHLORIDE CRYS ER 20 MEQ PO TBCR
40.0000 meq | EXTENDED_RELEASE_TABLET | Freq: Once | ORAL | Status: AC
  Administered 2017-09-13: 40 meq via ORAL
  Filled 2017-09-13: qty 2

## 2017-09-13 MED ORDER — PROMETHAZINE HCL 25 MG/ML IJ SOLN
25.0000 mg | Freq: Once | INTRAMUSCULAR | Status: AC
  Administered 2017-09-13: 25 mg via INTRAVENOUS
  Filled 2017-09-13: qty 1

## 2017-09-13 MED ORDER — IOPAMIDOL (ISOVUE-300) INJECTION 61%
INTRAVENOUS | Status: AC
  Filled 2017-09-13: qty 100

## 2017-09-13 MED ORDER — KETOROLAC TROMETHAMINE 30 MG/ML IJ SOLN
15.0000 mg | Freq: Once | INTRAMUSCULAR | Status: AC
  Administered 2017-09-13: 15 mg via INTRAVENOUS
  Filled 2017-09-13: qty 1

## 2017-09-13 NOTE — Discharge Instructions (Signed)
Your workup has been reassuring today.  You do have that cyst on your left ovary.  Please call your OB/GYN doctor this week to follow-up.  Return to the ED with any worsening fevers, urinary symptoms, worsening pain or vomiting.  Have given you short course of pain medicine.  Would also recommend keeping heating pad on the affected area and take Motrin and Tylenol around-the-clock.

## 2017-09-13 NOTE — Telephone Encounter (Signed)
NOTED

## 2017-09-13 NOTE — ED Notes (Signed)
Attempted IV x2; unsuccessful , IV team order in and another staff RN to try with [place]

## 2017-09-13 NOTE — ED Notes (Signed)
Spoke with IV nurse; she is on her way. IV team US machine is broken so ED US at bedside for RN to use.

## 2017-09-13 NOTE — ED Triage Notes (Signed)
Patient c/o abd pain on left side since MOnday. Reports PMH diverticulitis and cyst on ovary. When called her PCP was told to come to ED.

## 2017-09-13 NOTE — ED Provider Notes (Signed)
[facility]-EMERGENCY DEPT Provider Note   CSN: [id number] Arrival date & time: 09/13/17  1041     History   Chief Complaint Chief Complaint  Patient presents with  . Abdominal Pain  . Back Pain    HPI Kathleen Solis is a 52 y.o. female.  HPI 52 year old African-American female past medical history significant for hypertension, diverticulitis and ovarian cyst presents to the emergency department today with complaints of left lower quadrant abdominal pain.  Patient states that her pain has been ongoing for the past 2-3 days.  She describes the pain as sharp and nonradiating.  Patient states that the pain is constant.  Nothing makes better or worse.  She has not taking for the pain prior to arrival.  Patient denies any associated urinary symptoms, vaginal bleeding, change in bowel habits.  She does report some associated nausea but denies any emesis.  Denies any associated fevers, chills, bloody stools.   Patient does report history of diverticulitis in the past and states this feels very similar.  She also reports a history of a significant complicated left ovarian cyst that is currently followed by GYN.  Concern initially for possible ovarian neoplasm however she did have this ruled out by GYN.  Patient has scheduled surgery at the end of February for removal of the left ovary.  Patient spoke with her primary care doctor today who told her to come to the emergency department for evaluation.  Pt denies any fever, chill, ha, vision changes, lightheadedness, dizziness, congestion, neck pain, cp, sob, cough, urinary symptoms, change in bowel habits, melena, hematochezia, lower extremity paresthesias.  Past Medical History:  Diagnosis Date  . Diverticulitis   . Hypertension   . Stroke [facility])     Patient Active Problem List   Diagnosis Date Noted  . Nausea 06/07/2017  . Dark stools 06/07/2017  . Hyperlipidemia 02/14/2017  . Chest pain 02/14/2017  . Hyperglycemia  02/14/2017  . Generalized abdominal pain 09/13/2016  . Constipation 09/13/2016  . Dysphagia 08/16/2016  . Great toe pain, left 08/16/2016  . Allergic rhinitis 08/16/2016  . Carotid arterial disease (HCC) 04/10/2016  . Diverticulitis of colon without hemorrhage 02/25/2016  . Bunion of left foot 02/08/2016  . Bone spur of right foot 02/08/2016  . Leg neuralgia, right 02/08/2016  . Episodic tension type headache 01/12/2016  . Benign essential HTN 03/03/2015  . History of cerebrovascular accident with residual deficit 03/03/2015    Past Surgical History:  Procedure Laterality Date  . ABDOMINAL HYSTERECTOMY     partial  . ANAL FISSURE REPAIR    . APPENDECTOMY    . CHOLECYSTECTOMY    . TUBAL LIGATION      OB History    Gravida Para Term Preterm AB Living   4 2 2   2 2    SAB TAB Ectopic Multiple Live Births   2       2       Home Medications    Prior to Admission medications   Medication Sig Start Date End Date Taking? Authorizing Provider  hydrochlorothiazide (HYDRODIURIL) 25 MG tablet TAKE 25 MG BY MOUTH ONCE DAILY 08/25/17  Yes Burns, Bobette Mo, MD  metoprolol succinate (TOPROL-XL) 100 MG 24 hr tablet TAKE 100 MG BY MOUTH ONCE DAILY WITH OR IMMEDIATELY FOLLOWING A  MEAL Patient taking differently: Take 100 mg by mouth daily. TAKE 100 MG BY MOUTH ONCE DAILY WITH OR IMMEDIATELY FOLLOWING A  MEAL 08/25/17  Yes Burns, Bobette Mo, MD  aspirin EC 81 MG tablet Take 1 tablet (81 mg total) by mouth daily. Patient not taking: Reported on 09/13/2017 02/08/16   Pincus Sanes, MD  atorvastatin (LIPITOR) 40 MG tablet Take 1 tablet (40 mg total) by mouth daily. Patient not taking: Reported on 09/13/2017 02/14/17   Pincus Sanes, MD  linaclotide [facility]) 145 MCG CAPS capsule Take 1 capsule (145 mcg total) by mouth daily before breakfast. Patient not taking: Reported on 09/13/2017 09/15/16   Pincus Sanes, MD  traMADol (ULTRAM) 50 MG tablet Take 1 tablet (50 mg total) by mouth every 6 (six) hours as  needed. 09/13/17   Rise Mu, PA-C    Family History Family History  Problem Relation Age of Onset  . Breast cancer Sister     Social History Social History   Tobacco Use  . Smoking status: Never Smoker  . Smokeless tobacco: Never Used  Substance Use Topics  . Alcohol use: No  . Drug use: No     Allergies   Darvon [propoxyphene]; Morphine and related; Sulfa antibiotics; Buprenorphine hcl; and Prochlorperazine edisylate   Review of Systems Review of Systems  Constitutional: Negative for chills and fever.  HENT: Negative for congestion.   Eyes: Negative for visual disturbance.  Respiratory: Negative for cough and shortness of breath.   Cardiovascular: Negative for chest pain.  Gastrointestinal: Positive for abdominal pain, nausea and vomiting. Negative for diarrhea.  Genitourinary: Negative for dysuria, flank pain, frequency, hematuria, urgency, vaginal bleeding and vaginal discharge.  Musculoskeletal: Negative for arthralgias and myalgias.  Skin: Negative for rash.  Neurological: Negative for dizziness, syncope, weakness, light-headedness, numbness and headaches.  Psychiatric/Behavioral: Negative for sleep disturbance. The patient is not nervous/anxious.      Physical Exam Updated Vital Signs BP 140/81   Pulse (!) 54   Temp 98.4 F (36.9 C)   Resp 16   SpO2 94%   Physical Exam  Constitutional: She is oriented to person, place, and time. She appears well-developed and well-nourished.  Non-toxic appearance. No distress.  HENT:  Head: Normocephalic and atraumatic.  Nose: Nose normal.  Mouth/Throat: Oropharynx is clear and moist.  Eyes: Conjunctivae are normal. Pupils are equal, round, and reactive to light. Right eye exhibits no discharge. Left eye exhibits no discharge.  Neck: Normal range of motion. Neck supple.  Cardiovascular: Normal rate, regular rhythm, normal heart sounds and intact distal pulses.  Pulmonary/Chest: Effort normal and breath sounds  normal. No respiratory distress. She exhibits no tenderness.  Abdominal: Soft. Bowel sounds are normal. There is tenderness in the left lower quadrant. There is no rigidity, no rebound, no guarding, no CVA tenderness, no tenderness at McBurney's point and negative Murphy's sign.  Musculoskeletal: Normal range of motion. She exhibits no tenderness.  Lymphadenopathy:    She has no cervical adenopathy.  Neurological: She is alert and oriented to person, place, and time.  Skin: Skin is warm and dry. Capillary refill takes less than 2 seconds.  Psychiatric: Her behavior is normal. Judgment and thought content normal.  Nursing note and vitals reviewed.    ED Treatments / Results  Labs (all labs ordered are listed, but only abnormal results are displayed) Labs Reviewed  COMPREHENSIVE METABOLIC PANEL - Abnormal; Notable for the following components:      Result Value   Potassium 3.2 (*)    Chloride 97 (*)    Glucose, Bld 110 (*)    Total Bilirubin 1.6 (*)    All other components within normal limits  CBC - Abnormal; Notable for the following components:   MCHC 36.9 (*)    All other components within normal limits  URINALYSIS, ROUTINE W REFLEX MICROSCOPIC - Abnormal; Notable for the following components:   Ketones, ur 5 (*)    All other components within normal limits  LIPASE, BLOOD  I-STAT BETA HCG BLOOD, ED ([facility], [facility], [facility] ONLY)    EKG  EKG Interpretation None       Radiology US Transvaginal Non-ob  Result Date: 09/13/2017 CLINICAL DATA:  Pelvic pain x2 days. History of ovarian cyst and hysterectomy. EXAM: TRANSABDOMINAL AND TRANSVAGINAL ULTRASOUND OF PELVIS DOPPLER ULTRASOUND OF OVARIES TECHNIQUE: Both transabdominal and transvaginal ultrasound examinations of the pelvis were performed. Transabdominal technique was performed for global imaging of the pelvis including uterus, ovaries, adnexal regions, and pelvic cul-de-sac. It was necessary to proceed with endovaginal exam following  the transabdominal exam to visualize the ovaries. Color and duplex Doppler ultrasound was utilized to evaluate blood flow to the ovaries. COMPARISON:  06/15/2017 and 12/29/2014 pelvic ultrasound FINDINGS: Uterus Surgically absent Endometrium Not applicable Right ovary Not visualized Left ovary Measurements: 5.6 x 3.6 x 4.2 cm. The previously noted complex appearing 4.5 cm cyst on the 06/15/2017 study appears more anechoic and simple without mural nodularity or septations on current exam. This presently measures 4.4 x 4 x 3.8 cm. Pulsed Doppler evaluation of visualized left ovary demonstrates normal low-resistance arterial and venous waveforms. Other findings No abnormal free fluid. IMPRESSION: 1. Redemonstration of left ovarian cyst currently measuring 4 x 3.8 x 4.4 cm. Internally, it appears more anechoic and simple in appearance relative to prior comparison studies. No mural nodularity or septations are visualized. No evidence of torsion. No worrisome features are apparent. There is no free fluid or adenopathy. 2. Nonvisualized right ovary. 3. Status post hysterectomy. Electronically Signed   By: Tollie Eth M.D.   On: 09/13/2017 14:21   US Pelvis Complete  Result Date: 09/13/2017 CLINICAL DATA:  Pelvic pain x2 days. History of ovarian cyst and hysterectomy. EXAM: TRANSABDOMINAL AND TRANSVAGINAL ULTRASOUND OF PELVIS DOPPLER ULTRASOUND OF OVARIES TECHNIQUE: Both transabdominal and transvaginal ultrasound examinations of the pelvis were performed. Transabdominal technique was performed for global imaging of the pelvis including uterus, ovaries, adnexal regions, and pelvic cul-de-sac. It was necessary to proceed with endovaginal exam following the transabdominal exam to visualize the ovaries. Color and duplex Doppler ultrasound was utilized to evaluate blood flow to the ovaries. COMPARISON:  06/15/2017 and 12/29/2014 pelvic ultrasound FINDINGS: Uterus Surgically absent Endometrium Not applicable Right ovary Not  visualized Left ovary Measurements: 5.6 x 3.6 x 4.2 cm. The previously noted complex appearing 4.5 cm cyst on the 06/15/2017 study appears more anechoic and simple without mural nodularity or septations on current exam. This presently measures 4.4 x 4 x 3.8 cm. Pulsed Doppler evaluation of visualized left ovary demonstrates normal low-resistance arterial and venous waveforms. Other findings No abnormal free fluid. IMPRESSION: 1. Redemonstration of left ovarian cyst currently measuring 4 x 3.8 x 4.4 cm. Internally, it appears more anechoic and simple in appearance relative to prior comparison studies. No mural nodularity or septations are visualized. No evidence of torsion. No worrisome features are apparent. There is no free fluid or adenopathy. 2. Nonvisualized right ovary. 3. Status post hysterectomy. Electronically Signed   By: Tollie Eth M.D.   On: 09/13/2017 14:21   Ct Abdomen Pelvis W Contrast  Result Date: 09/13/2017 CLINICAL DATA:  Abdominal pain, suspect diverticulitis EXAM: CT ABDOMEN AND PELVIS WITH CONTRAST TECHNIQUE:  Multidetector CT imaging of the abdomen and pelvis was performed using the standard protocol following bolus administration of intravenous contrast. CONTRAST:  100mL ISOVUE-300 IOPAMIDOL (ISOVUE-300) INJECTION 61% COMPARISON:  Ultrasound pelvis 09/13/2017, CT abdomen pelvis 06/08/2017 FINDINGS: Lower chest: Negative Hepatobiliary: Cholecystectomy.  Normal liver and bile ducts. Pancreas: Negative Spleen: Negative Adrenals/Urinary Tract: Normal kidneys. No renal calculi. No renal mass or obstruction. Urinary bladder normal. Stomach/Bowel: Negative for bowel obstruction. No bowel edema or mass. No evidence of diverticulitis. Appendectomy clips. Vascular/Lymphatic: Negative Reproductive: Hysterectomy. 5 cm cyst in the left adnexal region unchanged from ultrasound today. No change from CT 06/08/2017. Mild enlargement since CT of 11/24/2014 Other: No free fluid Musculoskeletal: No acute  abnormality. Mild lumbar degenerative change. IMPRESSION: Negative for diverticulitis. No cause for acute abdominal pain identified. 5 cm left adnexal cyst, with mild interval growth since 2016. Electronically Signed   By: Marlan Palauharles  Clark M.D.   On: 09/13/2017 14:27   [place] Art/ven Flow Abd Pelv Doppler  Result Date: 09/13/2017 CLINICAL DATA:  Pelvic pain x2 days. History of ovarian cyst and hysterectomy. EXAM: TRANSABDOMINAL AND TRANSVAGINAL ULTRASOUND OF PELVIS DOPPLER ULTRASOUND OF OVARIES TECHNIQUE: Both transabdominal and transvaginal ultrasound examinations of the pelvis were performed. Transabdominal technique was performed for global imaging of the pelvis including uterus, ovaries, adnexal regions, and pelvic cul-de-sac. It was necessary to proceed with endovaginal exam following the transabdominal exam to visualize the ovaries. Color and duplex Doppler ultrasound was utilized to evaluate blood flow to the ovaries. COMPARISON:  06/15/2017 and 12/29/2014 pelvic ultrasound FINDINGS: Uterus Surgically absent Endometrium Not applicable Right ovary Not visualized Left ovary Measurements: 5.6 x 3.6 x 4.2 cm. The previously noted complex appearing 4.5 cm cyst on the 06/15/2017 study appears more anechoic and simple without mural nodularity or septations on current exam. This presently measures 4.4 x 4 x 3.8 cm. Pulsed Doppler evaluation of visualized left ovary demonstrates normal low-resistance arterial and venous waveforms. Other findings No abnormal free fluid. IMPRESSION: 1. Redemonstration of left ovarian cyst currently measuring 4 x 3.8 x 4.4 cm. Internally, it appears more anechoic and simple in appearance relative to prior comparison studies. No mural nodularity or septations are visualized. No evidence of torsion. No worrisome features are apparent. There is no free fluid or adenopathy. 2. Nonvisualized right ovary. 3. Status post hysterectomy. Electronically Signed   By: Tollie Ethavid  Kwon M.D.   On: 09/13/2017  14:21    Procedures Procedures (including critical care time)  Medications Ordered in ED Medications  promethazine (PHENERGAN) injection 25 mg (25 mg Intravenous Given 09/13/17 1248)  iopamidol (ISOVUE-300) 61 % injection 100 mL (100 mLs Intravenous Contrast Given 09/13/17 1409)  ketorolac (TORADOL) 30 MG/ML injection 15 mg (15 mg Intravenous Given 09/13/17 1509)  potassium chloride SA (K-DUR,KLOR-CON) CR tablet 40 mEq (40 mEq Oral Given 09/13/17 1509)     Initial Impression / Assessment and Plan / ED Course  I have reviewed the triage vital signs and the nursing notes.  Pertinent labs & imaging results that were available during my care of the patient were reviewed by me and considered in my medical decision making (see chart for details).     Patient presents to the emergency department today for evaluation of left lower quadrant abdominal pain.  Patient reports history of diverticulitis and ovarian cyst and states this feels similar to both of these.  She reports some associated nausea but denies any other associated symptoms.   Patient is overall well-appearing and nontoxic.  Vital signs are reassuring.  She is afebrile, no hypotension, no tachycardia is noted.  On exam patient has some left lower quadrant abdominal tenderness to palpation.  No signs of peritonitis.  Heart regular rate and rhythm.  Lungs clear to auscultation bilaterally.  Patient is lab work is reassuring.  No leukocytosis.  Kidney functions are normal.  Normal liver enzymes and lipase.  Patient's urine shows no signs of infection.  She does have a mild hypokalemia of 3.2 which was replaced with oral potassium.  Mild elevation in her bilirubin of 1.6.  Given patient's history of significant ovarian cyst and diverticulitis felt that ultrasound and CT scan was reasonable to rule out any of these pathologies causing her pain.  CT was performed which showed no acute findings.  She does have a redemonstration of the left  adnexal cyst with interval growth.  No signs of diverticulitis.  Ultrasound was obtained to rule out any ovarian torsion.  Does redemonstrate the ovarian cyst that appears more simple and complex in nature from prior studies.  She no signs of ovarian torsion.  Unable to visualize the right ovary.  However patient has no pain on the right side.  Patient's pain has been well controlled in the ED with Toradol.  She has tolerated p.o. fluid and food several times without any emesis.  I did speak with patient's OB/GYN office.  I spoke with the physician on-call at [facility] GYN Dr. Dion Body.  She states that patient can follow-up this week in their office.  Recommends NSAID for pain control.  Again the ovarian cyst is likely causing patient's pain.  Again no signs of ovarian torsion on exam or diverticulitis.  Low likelihood for intermittent torsion.  Repeat abdominal exam is benign without any focal tenderness or peritonitis.  Will encourage symptomatic treatment at home with close outpatient GYN follow-up.  Pt is hemodynamically stable, in NAD, & able to ambulate in the ED. Evaluation does not show pathology that would require ongoing emergent intervention or inpatient treatment. I explained the diagnosis to the patient. Pain has been managed & has no complaints prior to dc. Pt is comfortable with above plan and is stable for discharge at this time. All questions were answered prior to disposition. Strict return precautions for f/u to the ED were discussed. Encouraged follow up with PCP.   Final Clinical Impressions(s) / ED Diagnoses   Final diagnoses:  LLQ abdominal pain    ED Discharge Orders        Ordered    traMADol (ULTRAM) 50 MG tablet  Every 6 hours PRN     09/13/17 1558       Wallace Keller 09/13/17 2032    Alvira Monday, MD 09/14/17 Barry Brunner

## 2017-09-13 NOTE — Telephone Encounter (Signed)
Pt reports 7/10 constant abdominal pain, left sided below umbilicus. Onset Monday 09/11/17. Pain radiates to left groin, pelvic area. States difficult to walk, feels pressure (in addition to pain) in back when sitting. Tender to touch. States nauseated, unable to eat since Monday but reports taking PO fluids well. Denies diarrhea, vomiting, no pain with urination.  LBM this AM, WNL.  Has H/O diverticulitis and left sided ovarian cyst. She is scheduled for total hysterectomy in February. Pt instructed to go to ED. States will have to first secure ride. Pt sounds distressed; offered to call 911, pt declined. Instructed to call 911 if unable to get to ED.    Reason for Disposition . Patient sounds very sick or weak to the triager  Answer Assessment - Initial Assessment Questions 1. LOCATION: "Where does it hurt?"      Left side stomach, below umbilicus 2. RADIATION: "Does the pain shoot anywhere else?" (e.g., chest, back)     "Shoots to groin and pelvic area. Sometimes back." 3. ONSET: "When did the pain begin?" (e.g., minutes, hours or days ago)      09/11/17 4. SUDDEN: "Gradual or sudden onset?"     Gradual, worsening 5. PATTERN "Does the pain come and go, or is it constant?"    - If constant: "Is it getting better, staying the same, or worsening?"      (Note: Constant means the pain never goes away completely; most serious pain is constant and it progresses)     - If intermittent: "How long does it last?" "Do you have pain now?"     (Note: Intermittent means the pain goes away completely between bouts)     Constant 6. SEVERITY: "How bad is the pain?"  (e.g., Scale 1-10; mild, moderate, or severe)   - MILD (1-3): doesn't interfere with normal activities, abdomen soft and not tender to touch    - MODERATE (4-7): interferes with normal activities or awakens from sleep, tender to touch    - SEVERE (8-10): excruciating pain, doubled over, unable to do any normal activities      7/10, "Hard to walk,  pressure and pain to back when sitting." 7. RECURRENT SYMPTOM: "Have you ever had this type of abdominal pain before?" If so, ask: "When was the last time?" and "What happened that time?"      H/O diverticulosis 8. CAUSE: "What do you think is causing the abdominal pain?"     "Not sure, feels similar. Also I have an ovarian cyst." 9. RELIEVING/AGGRAVATING FACTORS: "What makes it better or worse?" (e.g., movement, antacids, bowel movement)     Nothing makes it better. 10. OTHER SYMPTOMS: "Has there been any vomiting, diarrhea, constipation, or urine problems?"       Nauseated, unable to eat but reports taking PO fluids well. No vomiting, no diarrhea, LBM this am, WNL.  No pain with urination. 11. PREGNANCY: "Is there any chance you are pregnant?" "When was your last menstrual period?"       No  Protocols used: [facility]

## 2017-09-26 ENCOUNTER — Other Ambulatory Visit: Payer: Self-pay | Admitting: Obstetrics and Gynecology

## 2017-09-28 NOTE — Patient Instructions (Addendum)
Your procedure is scheduled on: Wednesday October 04, 2017 at 8:30 am  [facility]: 7:00 am  Pick up the phone at the desk and dial [phone].  Call this number if you have problems the morning of surgery: [phone].  Remember: Do NOT eat food or drink any liquids after: Midnight Tuesday February 12 Do NOT drink clear liquids after: Take these medicines the morning of surgery with a SIP OF WATER: None  STOP ALL VITAMINS AND SUPPLEMENTS 1 WEEK PRIOR TO SURGERY  DO NOT SMOKE DAY OF SURGERY  Do NOT wear jewelry (body piercing), metal hair clips/bobby pins, make-up, or nail polish. Do NOT wear lotions, powders, or perfumes.  You may wear deoderant. Do NOT shave for 48 hours prior to surgery. Do NOT bring valuables to the hospital. Contacts, dentures, or bridgework may not be worn into surgery.  Have a responsible adult drive you home and stay with you for 24 hours after your procedure

## 2017-09-29 ENCOUNTER — Encounter (HOSPITAL_COMMUNITY): Payer: Self-pay

## 2017-09-29 ENCOUNTER — Encounter (HOSPITAL_COMMUNITY)
Admission: RE | Admit: 2017-09-29 | Discharge: 2017-09-29 | Disposition: A | Discharge status: Home / Self Care | Payer: Medicare Other | Source: Ambulatory Visit | Attending: Obstetrics and Gynecology | Admitting: Obstetrics and Gynecology

## 2017-09-29 ENCOUNTER — Other Ambulatory Visit: Payer: Self-pay

## 2017-09-29 DIAGNOSIS — Z01812 Encounter for preprocedural laboratory examination: Secondary | ICD-10-CM | POA: Insufficient documentation

## 2017-09-29 DIAGNOSIS — N83292 Other ovarian cyst, left side: Secondary | ICD-10-CM | POA: Diagnosis not present

## 2017-09-29 DIAGNOSIS — R102 Pelvic and perineal pain: Secondary | ICD-10-CM | POA: Diagnosis not present

## 2017-09-29 LAB — BASIC METABOLIC PANEL
Anion gap: 9 (ref 5–15)
BUN: 13 mg/dL (ref 6–20)
CO2: 25 mmol/L (ref 22–32)
Calcium: 8.9 mg/dL (ref 8.9–10.3)
Chloride: 99 mmol/L — ABNORMAL LOW (ref 101–111)
Creatinine, Ser: 0.77 mg/dL (ref 0.44–1.00)
GFR calc Af Amer: >60 mL/min (ref >60)
GFR calc non Af Amer: >60 mL/min (ref >60)
Glucose, Bld: 83 mg/dL (ref 65–99)
Potassium: 4.1 mmol/L (ref 3.5–5.1)
Sodium: 133 mmol/L — ABNORMAL LOW (ref 135–145)

## 2017-09-29 LAB — CBC
HCT: 37.5 % (ref 36.0–46.0)
Hemoglobin: 13.7 g/dL (ref 12.0–15.0)
MCH: 32.1 pg (ref 26.0–34.0)
MCHC: 36.5 g/dL — ABNORMAL HIGH (ref 30.0–36.0)
MCV: 87.8 fL (ref 78.0–100.0)
Platelets: 209 10*3/uL (ref 150–400)
RBC: 4.27 MIL/uL (ref 3.87–5.11)
RDW: 13.9 % (ref 11.5–15.5)
WBC: 7 10*3/uL (ref 4.0–10.5)

## 2017-09-29 NOTE — Pre-Procedure Instructions (Signed)
Dr. Sampson GoonFitzgerald viewed  EKG and aware of patient history of stroke. No medical clearance needed

## 2017-10-03 ENCOUNTER — Other Ambulatory Visit: Payer: Self-pay | Admitting: Obstetrics and Gynecology

## 2017-10-03 ENCOUNTER — Ambulatory Visit: Payer: Medicare Other | Admitting: Neurology

## 2017-10-03 NOTE — H&P (Signed)
Subjective: Chief Complaint(s):   pelvic pain and Left ovarian cyst. / PreOp for 10/18/17   HPI:  General 52 y/o presents for preop history and physical in preparation for laparoscopic bilateral salpingoophorectomy to manage complex left ovarian cyst and pelvic pain. She had a ct scan 4/4 2016 at which time she had a 3.5 cm cyst on the left ovary. she had an ultrasound performed. 06/15/2017 that showed a 4.5 cm complex cystic lesion with thin internal septations in the left adnexa. she reports vaginal hystectomy for uterine prolapse approximately 10 years ago. she has pelvic pain that is in the left lower quadrant and constant. she has taken tramadol with some relief. Current Medication: Taking  Metoprolol Succinate 100 MG Capsule ER 24 Hour Sprinkle 1 capsule Orally Once a day     Hydrochlorothiazide-25 mg 25 MG Tablet 1 tablet in the morning Orally Once a day     Medication List reviewed and reconciled with the patient   Medical History:  hypertension     TIA's      Allergies/Intolerance:  Sulfa Drugs - hives     Compazine - hallucinations     Morphine Sulfate - itching   Gyn History:  Sexual activity not currently sexually active. LMP pt unsure . Last pap smear date pt unsure . Last mammogram date more than two years. Denies STD. GYN procedures hysterectomy, bil tubal ligation.   OB History:  Number of pregnancies 4. miscarriages 2. Pregnancy # 1 live birth, vaginal delivery, boy. Pregnancy # 2 live birth, vaginal delivery, boy. Pregnancy # 3 miscarriage. Pregnancy # 4: miscarriage.   Surgical History:  cholecystectomy     partial hysterectomy     tubal ligation     appendectomy     bunionectomy     anal fissure   Hospitalization:  No Hospitalization History.   Family History:  Father: deceased, pancreatic cancer    Mother: deceased, diagnosed with Hypertension    Sister 1: Breast cancer    Maternal aunt: Ovarian cancer    3 brother(s) , 3 sister(s) . 2 son(s) .      denies any GYN family cancer hx.  Social History: General Tobacco use cigarettes: Never smoked, Tobacco history last updated 09/20/2017.  no Alcohol.  no Recreational drug use.  Marital Status: single.  Children: 2, Boys.  OCCUPATION: unemployed.   ROS: CONSTITUTIONAL No" options="no,yes" propid="91" itemid="193425" categoryid="10464" encounterid="[id number]"Chills No. No" options="no,yes" propid="91" itemid="172899" categoryid="10464" encounterid="[id number]"Fatigue No. No" options="no,yes" propid="91" itemid="10467" categoryid="10464" encounterid="[id number]"Fever No. No" options="no,yes" propid="91" itemid="193426" categoryid="10464" encounterid="[id number]"Night sweats No. No" options="no,yes" propid="91" itemid="444261" categoryid="10464" encounterid="[id number]"Recent travel outside [place] No. No" options="no,yes" propid="91" itemid="193427" categoryid="10464" encounterid="[id number]"Sweats No. No" options="no,yes" propid="91" itemid="194825" categoryid="10464" encounterid="[id number]"Weight change No.  OPHTHALMOLOGY no" options="no,yes" propid="91" itemid="12520" categoryid="12516" encounterid="[id number]"Blurring of vision no. no" options="no,yes" propid="91" itemid="193469" categoryid="12516" encounterid="[id number]"Change in vision no. no" options="no,yes" propid="91" itemid="194379" categoryid="12516" encounterid="[id number]"Double vision no.  ENT no" options="no,yes" propid="91" itemid="193612" categoryid="10481" encounterid="[id number]"Dizziness no. Nose bleeds no. Sore throat no. Teeth pain no.  ALLERGY no" options="no,yes" propid="91" itemid="202589" categoryid="138152" encounterid="[id number]"Hives no.  CARDIOLOGY no" options="no,yes" propid="91" itemid="193603" categoryid="10488" encounterid="[id number]"Chest pain no. no" options="no,yes" propid="91" itemid="199089" categoryid="10488" encounterid="[id number]"High blood pressure no. no" options="no,yes" propid="91" itemid="202598" categoryid="10488"  encounterid="[id number]"Irregular heart beat no. no" options="no,yes" propid="91" itemid="10491" categoryid="10488" encounterid="[id number]"Leg edema no. no" options="no,yes" propid="91" itemid="10490" categoryid="10488" encounterid="[id number]"Palpitations no.  RESPIRATORY no" options="no" propid="91" itemid="270013" categoryid="138132" encounterid="[id number]"Shortness of breath no. no" options="no,yes" propid="91" itemid="172745" categoryid="138132" encounterid="10113781"Cough no. no" options="no,yes" propid="91" itemid="193621" categoryid="138132" encounterid="10113781"Wheezing no.  UROLOGY no" options="no,yes" propid="91" itemid="194377" categoryid="138166" encounterid="10113781"Pain with urination no. no" options="no,yes" propid="91" itemid="193493" categoryid="138166"  encounterid="[id number]"Urinary urgency no. no" options="no,yes" propid="91" itemid="193492" categoryid="138166" encounterid="10113781"Urinary frequency no. no" options="no,yes" propid="91" itemid="138171" categoryid="138166" encounterid="10113781"Urinary incontinence no. No" options="no,yes" propid="91" itemid="138167" categoryid="138166" encounterid="10113781"Difficulty urinating No. No" options="no,yes" propid="91" itemid="138168" categoryid="138166" encounterid="10113781"Blood in urine No.  GASTROENTEROLOGY no" options="no,yes" propid="91" itemid="10496" categoryid="10494" encounterid="[id number]"Abdominal pain no. no" options="no,yes" propid="91" itemid="193447" categoryid="10494" encounterid="10113781"Appetite change no. no" options="no,yes" propid="91" itemid="193448" categoryid="10494" encounterid="10113781"Bloating/belching no. no" options="no,yes" propid="91" itemid="10503" categoryid="10494" encounterid="[id number]"Blood in stool or on toilet paper no. no" options="no,yes" propid="91" itemid="199106" categoryid="10494" encounterid="10113781"Change in bowel movements no. no" options="no,yes" propid="91" itemid="10501" categoryid="10494"  encounterid="10113781"Constipation no. no" options="no,yes" propid="91" itemid="10502" categoryid="10494" encounterid="10113781"Diarrhea no. no" options="no,yes" propid="91" itemid="199104" categoryid="10494" encounterid="10113781"Difficulty swallowing no. no" options="no,yes" propid="91" itemid="10499" categoryid="10494" encounterid="10113781"Nausea no.  FEMALE REPRODUCTIVE no" options="no,yes" propid="91" itemid="453725" categoryid="10525" encounterid="10113781"Vulvar pain no. no" options="no,yes" propid="91" itemid="453726" categoryid="10525" encounterid="10113781"Vulvar rash no. no" options="no, yes" propid="91" itemid="444315" categoryid="10525" encounterid="10113781"Abnormal vaginal bleeding no. no" options="no,yes" propid="91" itemid="186083" categoryid="10525" encounterid="10113781"Breast pain no. no" options="no,yes" propid="91" itemid="186084" categoryid="10525" encounterid="10113781"Nipple discharge no. no" options="no,yes" propid="91" itemid="275823" categoryid="10525" encounterid="10113781"Pain with intercourse no. yes" options="no,yes" propid="91" itemid="186082" categoryid="10525" encounterid="10113781"Pelvic pain yes. no" options="no,yes" propid="91" itemid="278230" categoryid="10525" encounterid="10113781"Unusual vaginal discharge no. no" options="no,yes" propid="91" itemid="278942" categoryid="10525" encounterid="10113781"Vaginal itching no.  MUSCULOSKELETAL no" options="no,yes" propid="91" itemid="193461" categoryid="10514" encounterid="10113781"Muscle aches no.  NEUROLOGY no" options="no,yes" propid="91" itemid="12513" categoryid="12512" encounterid="10113781"Headache no. no" options="no,yes" propid="91" itemid="12514" categoryid="12512" encounterid="10113781"Tingling/numbness no. no" options="no,yes" propid="91" itemid="193468" categoryid="12512" encounterid="10113781"Weakness no.  PSYCHOLOGY no" options="" propid="91" itemid="275919" categoryid="10520" encounterid="10113781"Depression  no. no" options="no,yes" propid="91" itemid="172748" categoryid="10520" encounterid="10113781"Anxiety no. no" options="no,yes" propid="91" itemid="199158" categoryid="10520" encounterid="10113781"Nervousness no. no" options="no,yes" propid="91" itemid="12502" categoryid="10520" encounterid="10113781"Sleep disturbances no. no " options="no,yes" propid="91" itemid="72718" categoryid="10520" encounterid="10113781"Suicidal ideation no .  ENDOCRINOLOGY no" options="no,yes" propid="91" itemid="194628" categoryid="12508" encounterid="10113781"Excessive thirst no. no" options="no,yes" propid="91" itemid="196285" categoryid="12508" encounterid="10113781"Excessive urination no. no" options="no, yes" propid="91" itemid="444314" categoryid="12508" encounterid="10113781"Hair loss no. no" options="" propid="91" itemid="447284" categoryid="12508" encounterid="10113781"Heat or cold intolerance no.  HEMATOLOGY/LYMPH no" options="no,yes" propid="91" itemid="199152" categoryid="138157" encounterid="10113781"Abnormal bleeding no. no" options="no,yes" propid="91" itemid="170653" categoryid="138157" encounterid="10113781"Easy bruising no. no" options="no,yes" propid="91" itemid="138158" categoryid="138157" encounterid="10113781"Swollen glands no.  DERMATOLOGY no" options="no,yes" propid="91" itemid="199126" categoryid="12503" encounterid="10113781"New/changing skin lesion no. no" options="no,yes" propid="91" itemid="12504" categoryid="12503" encounterid="10113781"Rash no. no" options="" propid="91" itemid="444313" categoryid="12503" encounterid="10113781"Sores no.   Negative except as stated in HPI.  Objective: Vitals: Wt 183, Ht 5'5, BMI 35.74, Pulse sitting 73, BP sitting 124/74  Past Results: Examination:  General Examination alert, oriented, NAD " categoryPropId="10089" examid="193638"GENERAL APPEARANCE alert, oriented, NAD .  moist, warm" categoryPropId="10109" examid="193638"SKIN: moist, warm.  Conjunctiva clear"  categoryPropId="21468" examid="193638"EYES: Conjunctiva clear.  clear to auscultation bilaterally" categoryPropId="87" examid="193638"LUNGS: clear to auscultation bilaterally.  regular rate and rhythm" categoryPropId="86" examid="193638"HEART: regular rate and rhythm.  soft, non-tender/non-distended, bowel sounds present " categoryPropId="88" examid="193638"ABDOMEN: soft, non-tender/non-distended, bowel sounds present .  normal external genitalia, labia - unremarkable, vagina - pink moist mucosa, no lesions or abnormal discharge, left adnexal tenderness and full ness on exam" categoryPropId="13414" examid="193638"FEMALE GENITOURINARY: normal external genitalia, labia - unremarkable, vagina - pink moist mucosa, no lesions or abnormal discharge, left adnexal tenderness and full ness on exam.  no edema present" categoryPropId="89" examid="193638"EXTREMITIES: no edema present.  affect normal, good eye contact" categoryPropId="16316" examid="193638"PSYCH: affect normal, good eye contact.  Physical Examination:    Assessment: Assessment:  Complex cyst of left ovary - N83.292 (Primary)     Pelvic pain - R10.2     Plan: Treatment: Complex cyst of left ovary Notes: she had a normal ca 125- recommend laparoscopic bilateral salpingoophroectomy possible laparotomy- r/b/a of surgery were discussed including but not limited to infection bleeding damage to bowel bladder ureters and surrounding organs with the need for futher surgery. r/o laparotomy  discussed. pt advised if malignancy is found addition surgery may be required. pt voiced understanding and desires to proceed. Pelvic pain Notes: most likey due to left ovarian cyst. pt informed if pain persist after left ovarian cyst is removed that another etiology should be considered.

## 2017-10-03 NOTE — H&P (Deleted)
  The note originally documented on this encounter has been moved the the encounter in which it belongs.  

## 2017-10-03 NOTE — Anesthesia Preprocedure Evaluation (Addendum)
Anesthesia Evaluation  Patient identified by MRN, date of birth, ID band Patient awake    Reviewed: Allergy & Precautions, H&P , NPO status , Patient's Chart, lab work & pertinent test results, reviewed documented beta blocker date and time   Airway Mallampati: II  TM Distance: >3 FB Neck ROM: full    Dental no notable dental hx. (+) Dental Advisory Given, Teeth Intact   Pulmonary neg pulmonary ROS,    Pulmonary exam normal breath sounds clear to auscultation       Cardiovascular hypertension, Pt. on medications and Pt. on home beta blockers + Peripheral Vascular Disease   Rhythm:Regular Rate:Normal     Neuro/Psych  Headaches,  Neuromuscular disease CVA, Residual Symptoms negative psych ROS   GI/Hepatic negative GI ROS, Neg liver ROS,   Endo/Other  negative endocrine ROS  Renal/GU negative Renal ROS  negative genitourinary   Musculoskeletal negative musculoskeletal ROS (+)   Abdominal   Peds  Hematology negative hematology ROS (+)   Anesthesia Other Findings   Reproductive/Obstetrics                           Anesthesia Physical Anesthesia Plan  ASA: III  Anesthesia Plan: General   Post-op Pain Management:    Induction: Intravenous  PONV Risk Score and Plan: 2 and Treatment may vary due to age or medical condition, Dexamethasone, Ondansetron and Scopolamine patch - Pre-op  Airway Management Planned: Oral ETT  Additional Equipment: None  Intra-op Plan:   Post-operative Plan: Extubation in OR  Informed Consent: I have reviewed the patients History and Physical, chart, labs and discussed the procedure including the risks, benefits and alternatives for the proposed anesthesia with the patient or authorized representative who has indicated his/her understanding and acceptance.   Dental advisory given  Plan Discussed with: CRNA  Anesthesia Plan Comments: (  )        Anesthesia Quick Evaluation

## 2017-10-04 ENCOUNTER — Encounter (HOSPITAL_COMMUNITY)
Admission: RE | Disposition: A | Discharge status: Home / Self Care | Payer: Self-pay | Source: Ambulatory Visit | Attending: Obstetrics and Gynecology

## 2017-10-04 ENCOUNTER — Inpatient Hospital Stay (HOSPITAL_COMMUNITY): Payer: Medicare Other | Admitting: Anesthesiology

## 2017-10-04 ENCOUNTER — Other Ambulatory Visit: Payer: Self-pay

## 2017-10-04 ENCOUNTER — Encounter (HOSPITAL_COMMUNITY): Payer: Self-pay | Admitting: Certified Registered Nurse Anesthetist

## 2017-10-04 ENCOUNTER — Ambulatory Visit (HOSPITAL_COMMUNITY)
Admission: RE | Admit: 2017-10-04 | Discharge: 2017-10-04 | Disposition: A | Discharge status: Home / Self Care | Payer: Medicare Other | Source: Ambulatory Visit | Attending: Obstetrics and Gynecology | Admitting: Obstetrics and Gynecology

## 2017-10-04 DIAGNOSIS — N838 Other noninflammatory disorders of ovary, fallopian tube and broad ligament: Secondary | ICD-10-CM | POA: Diagnosis not present

## 2017-10-04 DIAGNOSIS — Z79899 Other long term (current) drug therapy: Secondary | ICD-10-CM | POA: Diagnosis not present

## 2017-10-04 DIAGNOSIS — N8301 Follicular cyst of right ovary: Secondary | ICD-10-CM | POA: Insufficient documentation

## 2017-10-04 DIAGNOSIS — I10 Essential (primary) hypertension: Secondary | ICD-10-CM | POA: Diagnosis not present

## 2017-10-04 DIAGNOSIS — N8302 Follicular cyst of left ovary: Secondary | ICD-10-CM | POA: Insufficient documentation

## 2017-10-04 DIAGNOSIS — N83202 Unspecified ovarian cyst, left side: Secondary | ICD-10-CM | POA: Diagnosis present

## 2017-10-04 DIAGNOSIS — R102 Pelvic and perineal pain: Secondary | ICD-10-CM | POA: Diagnosis present

## 2017-10-04 DIAGNOSIS — N736 Female pelvic peritoneal adhesions (postinfective): Secondary | ICD-10-CM | POA: Insufficient documentation

## 2017-10-04 DIAGNOSIS — Z8673 Personal history of transient ischemic attack (TIA), and cerebral infarction without residual deficits: Secondary | ICD-10-CM | POA: Diagnosis not present

## 2017-10-04 HISTORY — PX: LAPAROSCOPIC BILATERAL SALPINGO OOPHERECTOMY: SHX5890

## 2017-10-04 HISTORY — DX: Hyperlipidemia, unspecified: E78.5

## 2017-10-04 HISTORY — DX: Other seasonal allergic rhinitis: J30.2

## 2017-10-04 HISTORY — PX: CYSTOSCOPY: SHX5120

## 2017-10-04 SURGERY — SALPINGO-OOPHORECTOMY, BILATERAL, LAPAROSCOPIC
Anesthesia: General | Site: Bladder

## 2017-10-04 MED ORDER — KETOROLAC TROMETHAMINE 30 MG/ML IJ SOLN
INTRAMUSCULAR | Status: AC
  Filled 2017-10-04: qty 1

## 2017-10-04 MED ORDER — PHENYLEPHRINE HCL 10 MG/ML IJ SOLN
INTRAMUSCULAR | Status: DC | PRN
  Administered 2017-10-04 (×3): .08 mg via INTRAVENOUS

## 2017-10-04 MED ORDER — LACTATED RINGERS IV SOLN
INTRAVENOUS | Status: DC
  Administered 2017-10-04 (×2): via INTRAVENOUS

## 2017-10-04 MED ORDER — DEXAMETHASONE SODIUM PHOSPHATE 4 MG/ML IJ SOLN
INTRAMUSCULAR | Status: AC
  Filled 2017-10-04: qty 1

## 2017-10-04 MED ORDER — LIDOCAINE HCL (CARDIAC) 20 MG/ML IV SOLN
INTRAVENOUS | Status: DC | PRN
  Administered 2017-10-04: 50 mg via INTRAVENOUS

## 2017-10-04 MED ORDER — DEXAMETHASONE SODIUM PHOSPHATE 10 MG/ML IJ SOLN
INTRAMUSCULAR | Status: DC | PRN
  Administered 2017-10-04: 4 mg via INTRAVENOUS

## 2017-10-04 MED ORDER — METHYLENE BLUE 0.5 % INJ SOLN
INTRAVENOUS | Status: DC | PRN
  Administered 2017-10-04: 25 mg via INTRAVENOUS

## 2017-10-04 MED ORDER — BUPIVACAINE HCL (PF) 0.25 % IJ SOLN
INTRAMUSCULAR | Status: AC
  Filled 2017-10-04: qty 30

## 2017-10-04 MED ORDER — SCOPOLAMINE 1 MG/3DAYS TD PT72
1.0000 | MEDICATED_PATCH | Freq: Once | TRANSDERMAL | Status: DC
  Administered 2017-10-04: 1.5 mg via TRANSDERMAL

## 2017-10-04 MED ORDER — OXYCODONE HCL 5 MG PO TABS: 5.0000 mg | ORAL_TABLET | Freq: Once | ORAL | Status: DC | PRN

## 2017-10-04 MED ORDER — ROCURONIUM BROMIDE 100 MG/10ML IV SOLN
INTRAVENOUS | Status: DC | PRN
  Administered 2017-10-04: 40 mg via INTRAVENOUS
  Administered 2017-10-04: 10 mg via INTRAVENOUS

## 2017-10-04 MED ORDER — EPHEDRINE SULFATE 50 MG/ML IJ SOLN
INTRAMUSCULAR | Status: DC | PRN
  Administered 2017-10-04 (×6): 10 mg via INTRAVENOUS

## 2017-10-04 MED ORDER — ONDANSETRON HCL 4 MG/2ML IJ SOLN
INTRAMUSCULAR | Status: AC
  Filled 2017-10-04: qty 2

## 2017-10-04 MED ORDER — SUGAMMADEX SODIUM 200 MG/2ML IV SOLN
INTRAVENOUS | Status: AC
  Filled 2017-10-04: qty 2

## 2017-10-04 MED ORDER — FENTANYL CITRATE (PF) 100 MCG/2ML IJ SOLN
INTRAMUSCULAR | Status: AC
  Filled 2017-10-04: qty 2

## 2017-10-04 MED ORDER — MIDAZOLAM HCL 2 MG/2ML IJ SOLN
INTRAMUSCULAR | Status: AC
  Filled 2017-10-04: qty 2

## 2017-10-04 MED ORDER — PROPOFOL 10 MG/ML IV BOLUS
INTRAVENOUS | Status: AC
  Filled 2017-10-04: qty 20

## 2017-10-04 MED ORDER — FENTANYL CITRATE (PF) 250 MCG/5ML IJ SOLN
INTRAMUSCULAR | Status: AC
  Filled 2017-10-04: qty 5

## 2017-10-04 MED ORDER — GLYCOPYRROLATE 0.2 MG/ML IJ SOLN
INTRAMUSCULAR | Status: DC | PRN
  Administered 2017-10-04 (×2): 0.1 mg via INTRAVENOUS

## 2017-10-04 MED ORDER — STERILE WATER FOR IRRIGATION IR SOLN
Status: DC | PRN
  Administered 2017-10-04: 1000 mL via INTRAVESICAL

## 2017-10-04 MED ORDER — SODIUM CHLORIDE 0.9 % IR SOLN
Status: DC | PRN
  Administered 2017-10-04: 3000 mL

## 2017-10-04 MED ORDER — PROPOFOL 10 MG/ML IV BOLUS
INTRAVENOUS | Status: DC | PRN
  Administered 2017-10-04: 120 mg via INTRAVENOUS

## 2017-10-04 MED ORDER — HYDROMORPHONE HCL 2 MG PO TABS: 2.0000 mg | ORAL_TABLET | ORAL | 0 refills | Status: DC | PRN

## 2017-10-04 MED ORDER — MIDAZOLAM HCL 2 MG/2ML IJ SOLN
INTRAMUSCULAR | Status: DC | PRN
  Administered 2017-10-04: 2 mg via INTRAVENOUS

## 2017-10-04 MED ORDER — FUROSEMIDE 10 MG/ML IJ SOLN
INTRAMUSCULAR | Status: DC | PRN
  Administered 2017-10-04: 10 mg via INTRAMUSCULAR

## 2017-10-04 MED ORDER — SUGAMMADEX SODIUM 200 MG/2ML IV SOLN
INTRAVENOUS | Status: DC | PRN
  Administered 2017-10-04: 150 mg via INTRAVENOUS

## 2017-10-04 MED ORDER — ONDANSETRON HCL 4 MG/2ML IJ SOLN
4.0000 mg | Freq: Once | INTRAMUSCULAR | Status: AC | PRN
  Administered 2017-10-04: 4 mg via INTRAVENOUS

## 2017-10-04 MED ORDER — OXYCODONE HCL 5 MG/5ML PO SOLN: 5.0000 mg | Freq: Once | ORAL | Status: DC | PRN

## 2017-10-04 MED ORDER — IBUPROFEN 800 MG PO TABS: 800.0000 mg | ORAL_TABLET | Freq: Three times a day (TID) | ORAL | 0 refills | Status: DC | PRN

## 2017-10-04 MED ORDER — EPHEDRINE 5 MG/ML INJ
INTRAVENOUS | Status: AC
  Filled 2017-10-04: qty 10

## 2017-10-04 MED ORDER — LIDOCAINE HCL (PF) 1 % IJ SOLN
INTRAMUSCULAR | Status: AC
  Filled 2017-10-04: qty 5

## 2017-10-04 MED ORDER — CEFAZOLIN SODIUM-DEXTROSE 2-4 GM/100ML-% IV SOLN
INTRAVENOUS | Status: AC
  Filled 2017-10-04: qty 100

## 2017-10-04 MED ORDER — BUPIVACAINE HCL (PF) 0.25 % IJ SOLN
INTRAMUSCULAR | Status: DC | PRN
  Administered 2017-10-04: 30 mL

## 2017-10-04 MED ORDER — FENTANYL CITRATE (PF) 100 MCG/2ML IJ SOLN
INTRAMUSCULAR | Status: DC | PRN
  Administered 2017-10-04 (×4): 50 ug via INTRAVENOUS

## 2017-10-04 MED ORDER — FENTANYL CITRATE (PF) 100 MCG/2ML IJ SOLN
25.0000 ug | INTRAMUSCULAR | Status: DC | PRN
  Administered 2017-10-04 (×2): 50 ug via INTRAVENOUS

## 2017-10-04 MED ORDER — SCOPOLAMINE 1 MG/3DAYS TD PT72
MEDICATED_PATCH | TRANSDERMAL | Status: AC
  Filled 2017-10-04: qty 1

## 2017-10-04 MED ORDER — CEFAZOLIN SODIUM-DEXTROSE 2-4 GM/100ML-% IV SOLN
2.0000 g | INTRAVENOUS | Status: AC
  Administered 2017-10-04: 2 g via INTRAVENOUS

## 2017-10-04 MED ORDER — ONDANSETRON HCL 4 MG/2ML IJ SOLN
INTRAMUSCULAR | Status: DC | PRN
  Administered 2017-10-04: 4 mg via INTRAVENOUS

## 2017-10-04 MED ORDER — PHENYLEPHRINE 40 MCG/ML (10ML) SYRINGE FOR IV PUSH (FOR BLOOD PRESSURE SUPPORT)
PREFILLED_SYRINGE | INTRAVENOUS | Status: AC
  Filled 2017-10-04: qty 10

## 2017-10-04 MED ORDER — MIDAZOLAM HCL 2 MG/2ML IJ SOLN: INTRAMUSCULAR | Status: DC | PRN

## 2017-10-04 MED ORDER — KETOROLAC TROMETHAMINE 30 MG/ML IJ SOLN
INTRAMUSCULAR | Status: DC | PRN
  Administered 2017-10-04: 30 mg via INTRAVENOUS

## 2017-10-04 SURGICAL SUPPLY — 33 items
APPLICATOR ARISTA FLEXITIP XL (MISCELLANEOUS) ×3 IMPLANT
CABLE HIGH FREQUENCY MONO STRZ (ELECTRODE) IMPLANT
DISSECTOR BLUNT TIP ENDO 5MM (MISCELLANEOUS) ×3 IMPLANT
DRSG OPSITE POSTOP 3X4 (GAUZE/BANDAGES/DRESSINGS) ×3 IMPLANT
GLOVE BIOGEL M 6.5 STRL (GLOVE) ×6 IMPLANT
GLOVE BIOGEL PI IND STRL 6.5 (GLOVE) ×2 IMPLANT
GLOVE BIOGEL PI IND STRL 7.0 (GLOVE) ×2 IMPLANT
GLOVE BIOGEL PI INDICATOR 6.5 (GLOVE) ×1
GLOVE BIOGEL PI INDICATOR 7.0 (GLOVE) ×1
GOWN STRL REUS W/TWL LRG LVL3 (GOWN DISPOSABLE) ×6 IMPLANT
HEMOSTAT ARISTA ABSORB 3G PWDR (MISCELLANEOUS) ×3 IMPLANT
NS IRRIG 1000ML POUR BTL (IV SOLUTION) IMPLANT
PACK LAPAROSCOPY BASIN (CUSTOM PROCEDURE TRAY) ×3 IMPLANT
PACK TRENDGUARD 450 HYBRID PRO (MISCELLANEOUS) ×2 IMPLANT
PACK TRENDGUARD 600 HYBRD PROC (MISCELLANEOUS) IMPLANT
POUCH SPECIMEN RETRIEVAL 10MM (ENDOMECHANICALS) ×3 IMPLANT
PROTECTOR NERVE ULNAR (MISCELLANEOUS) ×6 IMPLANT
SEALER TISSUE G2 CVD JAW 35 (ENDOMECHANICALS) IMPLANT
SEALER TISSUE G2 CVD JAW 45CM (ENDOMECHANICALS)
SET CYSTO W/LG BORE CLAMP LF (SET/KITS/TRAYS/PACK) ×3 IMPLANT
SET IRRIG TUBING LAPAROSCOPIC (IRRIGATION / IRRIGATOR) ×3 IMPLANT
SHEARS HARMONIC ACE PLUS 36CM (ENDOMECHANICALS) ×3 IMPLANT
SLEEVE XCEL OPT CAN 5 100 (ENDOMECHANICALS) ×3 IMPLANT
SOLUTION ELECTROLUBE (MISCELLANEOUS) IMPLANT
SUT VIC AB 4-0 PS2 27 (SUTURE) ×3 IMPLANT
SUT VICRYL 0 UR6 27IN ABS (SUTURE) ×3 IMPLANT
TOWEL OR 17X24 6PK STRL BLUE (TOWEL DISPOSABLE) ×6 IMPLANT
TRAY FOLEY CATH SILVER 16FR (SET/KITS/TRAYS/PACK) ×3 IMPLANT
TRENDGUARD 450 HYBRID PRO PACK (MISCELLANEOUS) ×3
TRENDGUARD 600 HYBRID PROC PK (MISCELLANEOUS)
TROCAR XCEL NON-BLD 11X100MML (ENDOMECHANICALS) ×3 IMPLANT
TROCAR XCEL NON-BLD 5MMX100MML (ENDOMECHANICALS) ×3 IMPLANT
WARMER LAPAROSCOPE (MISCELLANEOUS) ×3 IMPLANT

## 2017-10-04 NOTE — Op Note (Signed)
10/04/2017  10:46 AM  PATIENT:  Kathleen Solis  52 y.o. female  PRE-OPERATIVE DIAGNOSIS:  Complex Cyst of Left Ovary Pelvic Pain  POST-OPERATIVE DIAGNOSIS:  Complex Cyst of Left Ovary Pelvic Pain  PROCEDURE:  Procedure(s): LAPAROSCOPIC BILATERAL SALPINGO OOPHORECTOMY, PELVIC WASHINGS (Bilateral) CYSTOSCOPY (N/A)  SURGEON:  Surgeon(s) and Role:    Gerald Leitz* Willadeen Colantuono, MD - Primary    * Geryl RankinsVarnado, Evelyn, MD - Assisting  PHYSICIAN ASSISTANT:None  ASSISTANTS: Dr. Dion BodyVarnado assisted due to concern for pelvic adhesive disease and the complexity of the surgery     ANESTHESIA:   general  EBL:  25 mL   BLOOD ADMINISTERED:none  DRAINS: Urinary Catheter (Foley)   LOCAL MEDICATIONS USED:  MARCAINE     SPECIMEN:  Source of Specimen:  bilateral fallopian tubes and ovaries. Left ovarian cyst   DISPOSITION OF SPECIMEN:  PATHOLOGY  COUNTS:  YES  TOURNIQUET:  * No tourniquets in log *  DICTATION: .Dragon Dictation  PLAN OF CARE: Discharge to home after PACU  PATIENT DISPOSITION:  PACU - hemodynamically stable.   Delay start of Pharmacological VTE agent (>24 hrs) due to surgical blood loss or risk of bleeding: not applicable  Findings. 5 cm left adnexal cyst most likely arising from the left ovary. Adhesions of the left adnexal mass to the colon and left pelvic side wall. Normal right fallopian tube and ovary.   Procedure: the patient was taken to the operating room placed under general anesthesia. Prepped and draped in the normal sterile fashion. A foley catheter was placed.  A sponge stick was placed in the vaginal vault. . Attention was turned to the abdomen where the umbilicus was injected with 10 cc of marcaine. A 10 mm trocar was placed under direct visualization.Findings are noted above  Pneumoperitoneum was achieved with C02 gas... A 5 mm trocar was placed in the right and left lower quadrants. Each trocar site was injected with 10 cc of marcaine prior to trocar placement. Pelvic  washings were collected.  The harmonic scalpel was used to excise the adhesions of the colon to the left adnexa and right side wall. . The left ureter could not be identified.  The left infundibulopelvic ligament was cauterized and transected with the harmonic scalpel. The right ureter was identified . The right infundibulopelvic ligament was excised.  An endo catch was placed through the umbilical incision. The specimens were placed in the endo catch and removed through the umbilical incision. The umbilical trocar was replaced. The left ureter could not be identified. The pelvis was irrigated. Slight oozing was noted from the left pelvic side wall. Arista was placed . Excellent hemostasis was noted. All trocars were removed. The fascia of the umbilical incision was closed with 0 vicryl. The skin incision were closed with 4-0 vicryl and derma bond.   Methylene blue was given by anesthesia. Cystoscopy was performed using a 30 degree cystoscope. Both ureters appeared patent and the efflux of urine was noted. The cystoscope was removed.   The patient was taken to the recovery room awake and in stable condition.  Sponge lap and needle counts were correct times 2.

## 2017-10-04 NOTE — Transfer of Care (Signed)
Immediate Anesthesia Transfer of Care Note  Patient: Kathleen Solis  Procedure(s) Performed: LAPAROSCOPIC BILATERAL SALPINGO OOPHORECTOMY, PELVIC WASHINGS (Bilateral Abdomen) CYSTOSCOPY (N/A Bladder)  Patient Location: PACU  Anesthesia Type:General  Level of Consciousness: awake, alert  and oriented  Airway & Oxygen Therapy: Patient Spontanous Breathing and Patient connected to nasal cannula oxygen  Post-op Assessment: Report given to RN and Post -op Vital signs reviewed and stable  Post vital signs: Reviewed and stable  Last Vitals:  Vitals:   10/04/17 0731  BP: (!) 120/91  Pulse: 71  Resp: 16  Temp: 36.9 C  SpO2: 99%    Last Pain:  Vitals:   10/04/17 0731  TempSrc: Oral      Patients Stated Pain Goal: 4 (10/04/17 0731)  Complications: No apparent anesthesia complications

## 2017-10-04 NOTE — Anesthesia Procedure Notes (Signed)
Procedure Name: Intubation Date/Time: 10/04/2017 8:37 AM Performed by: Cleda ClarksBrowder, Alonnah Lampkins R, CRNA Pre-anesthesia Checklist: Patient identified, Emergency Drugs available, Suction available and Patient being monitored Patient Re-evaluated:Patient Re-evaluated prior to induction Oxygen Delivery Method: Circle system utilized Preoxygenation: Pre-oxygenation with 100% oxygen Induction Type: IV induction Ventilation: Mask ventilation without difficulty Laryngoscope Size: Miller and 2 Grade View: Grade I Tube type: Oral Tube size: 7.0 mm Number of attempts: 1 Airway Equipment and Method: Stylet and Oral airway Placement Confirmation: ETT inserted through vocal cords under direct vision,  positive ETCO2 and breath sounds checked- equal and bilateral Secured at: 21 cm Tube secured with: Tape Dental Injury: Teeth and Oropharynx as per pre-operative assessment

## 2017-10-04 NOTE — H&P (Signed)
Date of Initial H&P: 10/03/2017 History reviewed, patient examined, no change in status, stable for surgery. 

## 2017-10-04 NOTE — Discharge Instructions (Addendum)
DISCHARGE INSTRUCTIONS: Laparoscopy  The following instructions have been prepared to help you care for yourself upon your return home today.  Wound care:  Do not get the incision wet for the first 24 hours. The incision should be kept clean and dry.  The Band-Aids or dressings may be removed the  3 days after surgery.  Should the incision become sore, red, and swollen after the first week, check with your doctor.  Personal hygiene:  Shower the day after your procedure.  Activity and limitations:  Do NOT drive or operate any equipment today.  Do NOT lift anything more than 15 pounds for 2-3 weeks after surgery.  Do NOT rest in bed all day.  Walking is encouraged. Walk each day, starting slowly with 5-minute walks 3 or 4 times a day. Slowly increase the length of your walks.  Walk up and down stairs slowly.  Do NOT do strenuous activities, such as golfing, playing tennis, bowling, running, biking, weight lifting, gardening, mowing, or vacuuming for 2-4 weeks. Ask your doctor when it is okay to start.  Diet: Eat a light meal as desired this evening. You may resume your usual diet tomorrow.  Return to work: This is dependent on the type of work you do. For the most part you can return to a desk job within a week of surgery. If you are more active at work, please discuss this with your doctor.  What to expect after your surgery: You may have a slight burning sensation when you urinate on the first day. You may have a very small amount of blood in the urine. Expect to have a small amount of vaginal discharge/light bleeding for 1-2 weeks. It is not unusual to have abdominal soreness and bruising for up to 2 weeks. You may be tired and need more rest for about 1 week. You may experience shoulder pain for 24-72 hours. Lying flat in bed may relieve it.  Call your doctor for any of the following:  Develop a fever of 100.4 or greater  Inability to urinate 6 hours after discharge from  hospital  Severe pain not relieved by pain medications  Persistent of heavy bleeding at incision site  Redness or swelling around incision site after a week  Increasing nausea or vomiting  Patient Signature________________________________________ Nurse Signature_________________________________________   Post Anesthesia Home Care Instructions  NO IBUPROFEN PRODUCTS UNTIL 4:00 PM TODAY Activity: Get plenty of rest for the remainder of the day. A responsible individual must stay with you for 24 hours following the procedure.  For the next 24 hours, DO NOT: -Drive a car -Advertising copywriterperate machinery -Drink alcoholic beverages -Take any medication unless instructed by your physician -Make any legal decisions or sign important papers.  Meals: Start with liquid foods such as gelatin or soup. Progress to regular foods as tolerated. Avoid greasy, spicy, heavy foods. If nausea and/or vomiting occur, drink only clear liquids until the nausea and/or vomiting subsides. Call your physician if vomiting continues.  Special Instructions/Symptoms: Your throat may feel dry or sore from the anesthesia or the breathing tube placed in your throat during surgery. If this causes discomfort, gargle with warm salt water. The discomfort should disappear within 24 hours.  If you had a scopolamine patch placed behind your ear for the management of post- operative nausea and/or vomiting:  1. The medication in the patch is effective for 72 hours, after which it should be removed.  Wrap patch in a tissue and discard in the trash. Wash hands  thoroughly with soap and water. 2. You may remove the patch earlier than 72 hours if you experience unpleasant side effects which may include dry mouth, dizziness or visual disturbances. 3. Avoid touching the patch. Wash your hands with soap and water after contact with the patch.

## 2017-10-05 ENCOUNTER — Encounter (HOSPITAL_COMMUNITY): Payer: Self-pay | Admitting: Obstetrics and Gynecology

## 2017-10-05 NOTE — Anesthesia Postprocedure Evaluation (Signed)
Anesthesia Post Note  Patient: Kathleen Solis  Procedure(s) Performed: LAPAROSCOPIC BILATERAL SALPINGO OOPHORECTOMY, PELVIC WASHINGS (Bilateral Abdomen) CYSTOSCOPY (N/A Bladder)     Patient location during evaluation: PACU Anesthesia Type: General Level of consciousness: awake and alert Pain management: pain level controlled Vital Signs Assessment: post-procedure vital signs reviewed and stable Respiratory status: spontaneous breathing, nonlabored ventilation, respiratory function stable and patient connected to nasal cannula oxygen Cardiovascular status: blood pressure returned to baseline and stable Postop Assessment: no apparent nausea or vomiting Anesthetic complications: no    Last Vitals:  Vitals:   10/04/17 1230 10/04/17 1300  BP: 96/60 104/65  Pulse: 86 85  Resp: (!) 24 14  Temp:  36.4 C  SpO2: 94% 95%    Last Pain:  Vitals:   10/04/17 1300  TempSrc:   PainSc: 3                  Beryle Lathehomas E Leesha Veno

## 2017-10-17 ENCOUNTER — Other Ambulatory Visit: Payer: Self-pay | Admitting: Obstetrics and Gynecology

## 2017-10-17 DIAGNOSIS — Z1231 Encounter for screening mammogram for malignant neoplasm of breast: Secondary | ICD-10-CM

## 2017-10-26 ENCOUNTER — Other Ambulatory Visit: Payer: Medicare Other

## 2017-10-26 ENCOUNTER — Other Ambulatory Visit: Payer: Self-pay | Admitting: Obstetrics and Gynecology

## 2017-10-26 DIAGNOSIS — R102 Pelvic and perineal pain: Secondary | ICD-10-CM

## 2017-10-27 ENCOUNTER — Ambulatory Visit
Admission: RE | Admit: 2017-10-27 | Discharge: 2017-10-27 | Disposition: A | Discharge status: Home / Self Care | Payer: Medicare Other | Source: Ambulatory Visit | Attending: Obstetrics and Gynecology | Admitting: Obstetrics and Gynecology

## 2017-10-27 ENCOUNTER — Other Ambulatory Visit: Payer: Self-pay | Admitting: Obstetrics and Gynecology

## 2017-10-27 DIAGNOSIS — R102 Pelvic and perineal pain: Secondary | ICD-10-CM

## 2017-11-08 ENCOUNTER — Ambulatory Visit
Admission: RE | Admit: 2017-11-08 | Discharge: 2017-11-08 | Disposition: A | Discharge status: Home / Self Care | Payer: Medicare Other | Source: Ambulatory Visit | Attending: Obstetrics and Gynecology | Admitting: Obstetrics and Gynecology

## 2017-11-08 DIAGNOSIS — Z1231 Encounter for screening mammogram for malignant neoplasm of breast: Secondary | ICD-10-CM

## 2017-12-26 ENCOUNTER — Other Ambulatory Visit: Payer: Medicare Other

## 2017-12-26 ENCOUNTER — Ambulatory Visit (INDEPENDENT_AMBULATORY_CARE_PROVIDER_SITE_OTHER): Payer: Medicare Other | Admitting: Neurology

## 2017-12-26 ENCOUNTER — Encounter: Payer: Self-pay | Admitting: Neurology

## 2017-12-26 VITALS — BP 118/78 | HR 58 | Ht 65.5 in | Wt 186.0 lb

## 2017-12-26 DIAGNOSIS — G44229 Chronic tension-type headache, not intractable: Secondary | ICD-10-CM

## 2017-12-26 DIAGNOSIS — I639 Cerebral infarction, unspecified: Secondary | ICD-10-CM

## 2017-12-26 DIAGNOSIS — I69320 Aphasia following cerebral infarction: Secondary | ICD-10-CM

## 2017-12-26 DIAGNOSIS — E7800 Pure hypercholesterolemia, unspecified: Secondary | ICD-10-CM | POA: Diagnosis not present

## 2017-12-26 DIAGNOSIS — I779 Disorder of arteries and arterioles, unspecified: Secondary | ICD-10-CM

## 2017-12-26 DIAGNOSIS — I69349 Monoplegia of lower limb following cerebral infarction affecting unspecified side: Secondary | ICD-10-CM | POA: Diagnosis not present

## 2017-12-26 DIAGNOSIS — I739 Peripheral vascular disease, unspecified: Secondary | ICD-10-CM

## 2017-12-26 DIAGNOSIS — R4189 Other symptoms and signs involving cognitive functions and awareness: Secondary | ICD-10-CM

## 2017-12-26 DIAGNOSIS — G4701 Insomnia due to medical condition: Secondary | ICD-10-CM

## 2017-12-26 NOTE — Progress Notes (Signed)
NEUROLOGY CONSULTATION NOTE  Kathleen Solis MRN: [id number] DOB: 16-Jan-1966  Referring provider: Dr. Lawerance Bach Primary care provider: Dr. Lawerance Bach  Reason for consult:  stroke  HISTORY OF PRESENT ILLNESS: Kathleen Solis is a 52 year old right-handed female with hypertension, hyperlipidemia, carotid artery disease, tension type headache and history of stroke who presents for history of stroke.  History supplemented by prior neurologist's notes  In 2001, she had a stroke while pregnant with her son.  She was told it was due to elevated blood pressure.  She presented with slurred speech and right leg weakness.  As a result, her son was born with cerebral palsy.  She has been followed by multiple neurologists.   Since the stroke, she has been on ASA  daily.  Non-contrast MRI brain report from 02/15/14 demonstrated minimal scattered T2 hyperintensities in the left periventricular white matter regions suggesting chronic microvascular degenerative disease but no acute infarcts, mass lesions or demyelinating disease.  This was also noted on prior MRI from 2012.  She presented to [facility] on 11/24/15 for acute onset right sided headache and numbness with trouble walking.  CT of head was personally reviewed and revealed no acute findings.  MRI of brain was ordered but she left AMA.  She followed up with neurologist, Dr. Stacy Gardner the following month.  MRI of brain with and without contrast performed on 01/19/16 again demonstrated patchy nonspecific cerebral white matter signal changes in left frontal lobe and periatrial white matter, possibly indicative of remote ischemic stroke.  Abnormal flow in the left cervical ICA noted as well.  Carotid doppler from 03/16/16 reportedly showed no hemodynamically significant stenosis.  A repeat MRI of the brain from 11/30/16 was personally reviewed to evaluate tinnitus in the right ear and again demonstrated chronic white matter changes, left greater than  right, in watershed distribution, but no acute findings.  She was treated for episodic tension type headaches as well.  The are moderate intensity non-throbbing headache on the top of her head.  They last 15 to 20 minutes and occur daily.  There are no associated symptoms such as nausea, photophobia, phonophobia or unilateral numbness or weakness.  She was always hesitant about starting a preventative.  She treated them with ibuprofen until she stopped because it was found to be the cause of her tinnitus.  02/15/17:  Hgb A1c 4.5; LDL 129.  She was previously on atorvastatin  but stopped due to leg pain.  She has been on disability since 2007.  She continues to have right leg weakness and trouble with speech.  She has memory deficits.  She is overall independent.  On one occasion, she got lost while driving on familiar route.  She usually uses a GPS.  Since her stroke, she has insomnia.  She previously had a sleep study that demonstrated mild OSA not requiring CPAP.   She denies depression.  PAST MEDICAL HISTORY: Past Medical History:  Diagnosis Date  . Diverticulitis   . Hyperlipidemia    diet controlled - no med  . Hypertension   . Seasonal allergies   . Stroke [facility]) 2000   several mini strokes, weakness on right arm and leg  . SVD (spontaneous vaginal delivery)    x 2    PAST SURGICAL HISTORY: Past Surgical History:  Procedure Laterality Date  . ABDOMINAL HYSTERECTOMY     partial  . ANAL FISSURE REPAIR    . APPENDECTOMY    . CHOLECYSTECTOMY    . CYSTOSCOPY  N/A 10/04/2017   Procedure: CYSTOSCOPY;  Surgeon: Gerald Leitz, MD;  Location: [facility];  Service: Gynecology;  Laterality: N/A;  . FOOT SURGERY Left    big toe bunion removed  . LAPAROSCOPIC BILATERAL SALPINGO OOPHERECTOMY Bilateral 10/04/2017   Procedure: LAPAROSCOPIC BILATERAL SALPINGO OOPHORECTOMY, PELVIC WASHINGS;  Surgeon: Gerald Leitz, MD;  Location: [facility];  Service: Gynecology;  Laterality: Bilateral;  . TUBAL LIGATION      . WISDOM TOOTH EXTRACTION      MEDICATIONS: Current Outpatient Medications on File Prior to Visit  Medication Sig Dispense Refill  . aspirin EC 81 MG tablet Take 81 mg by mouth daily.    Marland Kitchen atorvastatin (LIPITOR) 40 MG tablet Take 1 tablet (40 mg total) by mouth daily. (Patient not taking: Reported on 09/13/2017) 90 tablet 3  . hydrochlorothiazide (HYDRODIURIL) 25 MG tablet TAKE 25 MG BY MOUTH ONCE DAILY (Patient taking differently: Take 25 mg by mouth every evening. ) 90 tablet 1  . HYDROmorphone (DILAUDID) 2 MG tablet Take 1 tablet (2 mg total) by mouth every 4 (four) hours as needed for severe pain. (Patient not taking: Reported on 12/26/2017) 12 tablet 0  . ibuprofen (ADVIL,MOTRIN) 800 MG tablet Take 1 tablet (800 mg total) by mouth every 8 (eight) hours as needed. (Patient not taking: Reported on 12/26/2017) 30 tablet 0  . metoprolol succinate (TOPROL-XL) 100 MG 24 hr tablet TAKE 100 MG BY MOUTH ONCE DAILY WITH OR IMMEDIATELY FOLLOWING A  MEAL (Patient taking differently: Take 100 mg by mouth every evening. TAKE 100 MG BY MOUTH ONCE DAILY WITH OR IMMEDIATELY FOLLOWING A  MEAL) 90 tablet 1  . oxycodone-acetaminophen (PERCOCET) 2.5-325 MG tablet Take 0.5 tablets by mouth every 4 (four) hours as needed for pain (takes half pill as needed).    . promethazine (PHENERGAN) 25 MG tablet Take 25 mg by mouth every 6 (six) hours as needed for nausea or vomiting (takes half tablet as needed for nausea).     No current facility-administered medications on file prior to visit.     ALLERGIES: Allergies  Allergen Reactions  . Darvon [Propoxyphene] Other (See Comments)    Gi problems  . Morphine And Related Itching  . Sulfa Antibiotics Hives  . Buprenorphine Hcl Itching  . Prochlorperazine Edisylate Itching and Other (See Comments)    "Acts weird: Hallucinations"    FAMILY HISTORY: Family History  Problem Relation Age of Onset  . Breast cancer Sister   . Diabetes Sister   . Glaucoma Sister   .  Anuerysm Mother   . Pancreatic cancer Father     SOCIAL HISTORY: Social History   Socioeconomic History  . Marital status: Single    Spouse name: Not on file  . Number of children: 2  . Years of education: Not on file  . Highest education level: Associate degree: academic program  Occupational History  . Occupation: disabled  Social Needs  . Financial resource strain: Not on file  . Food insecurity:    Worry: Not on file    Inability: Not on file  . Transportation needs:    Medical: Not on file    Non-medical: Not on file  Tobacco Use  . Smoking status: Never Smoker  . Smokeless tobacco: Never Used  Substance and Sexual Activity  . Alcohol use: No  . Drug use: No  . Sexual activity: Not Currently    Birth control/protection: Surgical  Lifestyle  . Physical activity:    Days per week: Not on file  Minutes per session: Not on file  . Stress: Not on file  Relationships  . Social connections:    Talks on phone: Not on file    Gets together: Not on file    Attends religious service: Not on file    Active member of club or organization: Not on file    Attends meetings of clubs or organizations: Not on file    Relationship status: Not on file  . Intimate partner violence:    Fear of current or ex partner: Not on file    Emotionally abused: Not on file    Physically abused: Not on file    Forced sexual activity: Not on file  Other Topics Concern  . Not on file  Social History Narrative   No regular exercise      Pt is right handed, she occasionally drinks tea, walks QOD. She lives with her 26 yr old son, he has mild cerebral palsy.    REVIEW OF SYSTEMS: Constitutional: No fevers, chills, or sweats, no generalized fatigue, change in appetite Eyes: No visual changes, double vision, eye pain Ear, nose and throat: No hearing loss, ear pain, nasal congestion, sore throat Cardiovascular: No chest pain, palpitations Respiratory:  No shortness of breath at rest or with  exertion, wheezes GastrointestinaI: No nausea, vomiting, diarrhea, abdominal pain, fecal incontinence Genitourinary:  No dysuria, urinary retention or frequency Musculoskeletal:  No neck pain, back pain Integumentary: No rash, pruritus, skin lesions Neurological: as above Psychiatric: insomnia Endocrine: No palpitations, fatigue, diaphoresis, mood swings, change in appetite, change in weight, increased thirst Hematologic/Lymphatic:  No purpura, petechiae. Allergic/Immunologic: no itchy/runny eyes, nasal congestion, recent allergic reactions, rashes  PHYSICAL EXAM: BP:  118/78; Pulse 58 bpm, Wt 186 lb, Ht 5'5.5" General: No acute distress.  Patient appears well-groomed.  Head:  Normocephalic/atraumatic Eyes:  fundi examined but not visualized Neck: supple, no paraspinal tenderness, full range of motion Back: No paraspinal tenderness Heart: regular rate and rhythm Lungs: Clear to auscultation bilaterally. Vascular: No carotid bruits. Neurological Exam: Mental status: alert and oriented to person, place, and time, recent memory poor, remote memory intact, fund of knowledge intact, attention and concentration impaired expressive aphasia, no dysarthria, difficulty with naming, repeating.  Decreased naming fluency.  Able to follow commands.  Able to draw clock but did not complete Trail Making Test or copy cube correctly. Montreal Cognitive Assessment  12/26/2017  Visuospatial/ Executive (0/5) 3  Naming (0/3) 3  Attention: Read list of digits (0/2) 0  Attention: Read list of letters (0/1) 1  Attention: Serial 7 subtraction starting at 100 (0/3) 1  Language: Repeat phrase (0/2) 0  Language : Fluency (0/1) 0  Abstraction (0/2) 1  Delayed Recall (0/5) 0  Orientation (0/6) 5  Total 14  Adjusted Score (based on education) 14   Cranial nerves: CN I: not tested CN II: pupils equal, round and reactive to light, visual fields intact CN III, IV, VI:  full range of motion, no nystagmus, no  ptosis CN V: decreased right V1-V3 sensation CN VII: upper and lower face symmetric CN VIII: hearing intact CN IX, X: gag intact, uvula midline CN XI: sternocleidomastoid and trapezius muscles intact CN XII: tongue midline Bulk & Tone: normal, no fasciculations. Motor:  4+/5 right lower extremity, otherwise throughout Sensation:  Pinprick sensation reduced in right upper and lower extremities, and vibration sensation intact. Deep Tendon Reflexes:  2+ throughout, toes downgoing. Finger to nose testing:  Without dysmetria.  Gait:  Right hemiparetic gait.  Romberg negative.  IMPRESSION: 1.  History of left hemispheric stroke 2.  Expressive aphasia as late effect of stroke 3.  Monoplegia as late effect of stroke 4.  Vascular cognitive impairment 5.  Tension-type headache 6.  Insomnia  PLAN: 1.  Continue ASA  daily for secondary stroke prevention 2.  Recheck lipid panel.  She is agreeable to restarting atorvastatin at  daily. 3.  Continue blood pressure control 4.  Check carotid doppler 5.  Consider nortriptyline  at bedtime for headache prevention and help with insomnia.  She defers at this time. 6.  Advised to use GPS when driving. 7.  Follow up in 6 months.  Thank you for allowing me to take part in the care of this patient.  Shon Millet, DO  CC:  Cheryll Cockayne, MD

## 2017-12-26 NOTE — Patient Instructions (Addendum)
1.  Continue aspirin  daily  2.  We will check lipid panel.  We will restart the atorvastatin but at a lower dose. Your provider has requested that you have labwork completed today. Please go to [facility] ([id number]) on the second floor of this building before leaving the office today. You do not need to check in. If you are not called within 15 minutes please check with the front desk.   3.  We will check carotid doppler. We have sent a referral to [facility] for your doppler study and they will call you directly to schedule your appt. If you need to contact them directly please call [phone].  4.  Continue blood pressure control  5.  For headaches and insomnia, consider starting nortriptyline.  Let me know if you wish to try this medication  6.  Use GPS when driving  7.  Follow up in 6 months.

## 2017-12-27 LAB — LIPID PANEL WITH LDL/HDL RATIO
Cholesterol, Total: 200 mg/dL — ABNORMAL HIGH (ref 100–199)
HDL: 53 mg/dL (ref >39)
LDL Calculated: 122 mg/dL — ABNORMAL HIGH (ref 0–99)
LDl/HDL Ratio: 2.3 ratio (ref 0.0–3.2)
Triglycerides: 124 mg/dL (ref 0–149)
VLDL Cholesterol Cal: 25 mg/dL (ref 5–40)

## 2017-12-28 ENCOUNTER — Telehealth: Payer: Self-pay

## 2017-12-28 ENCOUNTER — Telehealth: Payer: Self-pay | Admitting: Neurology

## 2017-12-28 DIAGNOSIS — E7800 Pure hypercholesterolemia, unspecified: Secondary | ICD-10-CM

## 2017-12-28 MED ORDER — ATORVASTATIN CALCIUM 10 MG PO TABS: 10.0000 mg | ORAL_TABLET | Freq: Every day | ORAL | 6 refills | Status: DC

## 2017-12-28 NOTE — Telephone Encounter (Signed)
-----   Message from Drema Dallas, DO sent at 12/27/2017 10:40 AM EDT ----- The LDL is higher than I would like.  I would like to restart her on atorvastatin, but on a lower dose to see if she can tolerate it.  Start  daily.  Repeat lipid panel in 3 months.

## 2017-12-28 NOTE — Telephone Encounter (Signed)
Called and spoke with Pt, advsd her of lab results and recommendation to start Lipitor 10 mg, recheck lipid panel in 3 months. Discussed carotid dopplers again, reminded Pt the contact number was on her AVS, she found the number. She will call to schedule.

## 2017-12-28 NOTE — Telephone Encounter (Signed)
Patient called needing to get her Lab results. Please Call. Thanks

## 2018-02-20 NOTE — Patient Instructions (Addendum)
Make an eye appointment - [facility], [facility]   Test(s) ordered today. Your results will be released to [place] (or called to you) after review, usually within 72hours after test completion. If any changes need to be made, you will be notified at that same time.  Medications reviewed and updated.  Changes include topical medication for toenails and restart linzess.  Your prescription(s) have been submitted to your pharmacy. Please take as directed and contact our office if you believe you are having problem(s) with the medication(s).   Please followup in 6 months   Health Maintenance, Female Adopting a healthy lifestyle and getting preventive care can go a long way to promote health and wellness. Talk with your health care provider about what schedule of regular examinations is right for you. This is a good chance for you to check in with your provider about disease prevention and staying healthy. In between checkups, there are plenty of things you can do on your own. Experts have done a lot of research about which lifestyle changes and preventive measures are most likely to keep you healthy. Ask your health care provider for more information. Weight and diet Eat a healthy diet  Be sure to include plenty of vegetables, fruits, low-fat dairy products, and lean protein.  Do not eat a lot of foods high in solid fats, added sugars, or salt.  Get regular exercise. This is one of the most important things you can do for your health. ? Most adults should exercise for at least 150 minutes each week. The exercise should increase your heart rate and make you sweat (moderate-intensity exercise). ? Most adults should also do strengthening exercises at least twice a week. This is in addition to the moderate-intensity exercise.  Maintain a healthy weight  Body mass index (BMI) is a measurement that can be used to identify possible weight problems. It estimates body fat  based on height and weight. Your health care provider can help determine your BMI and help you achieve or maintain a healthy weight.  For females 59 years of age and older: ? A BMI below 18.5 is considered underweight. ? A BMI of 18.5 to 24.9 is normal. ? A BMI of 25 to 29.9 is considered overweight. ? A BMI of 30 and above is considered obese.  Watch levels of cholesterol and blood lipids  You should start having your blood tested for lipids and cholesterol at 51 years of age, then have this test every 5 years.  You may need to have your cholesterol levels checked more often if: ? Your lipid or cholesterol levels are high. ? You are older than 51 years of age. ? You are at high risk for heart disease.  Cancer screening Lung Cancer  Lung cancer screening is recommended for adults 87-74 years old who are at high risk for lung cancer because of a history of smoking.  A yearly low-dose CT scan of the lungs is recommended for people who: ? Currently smoke. ? Have quit within the past 15 years. ? Have at least a 30-pack-year history of smoking. A pack year is smoking an average of one pack of cigarettes a day for 1 year.  Yearly screening should continue until it has been 15 years since you quit.  Yearly screening should stop if you develop a health problem that would prevent you from having lung cancer treatment.  Breast Cancer  Practice breast self-awareness. This means understanding how your breasts normally appear and feel.  It also means doing regular breast self-exams. Let your health care provider know about any changes, no matter how small.  If you are in your 20s or 30s, you should have a clinical breast exam (CBE) by a health care provider every 1-3 years as part of a regular health exam.  If you are 71 or older, have a CBE every year. Also consider having a breast X-ray (mammogram) every year.  If you have a family history of breast cancer, talk to your health care  provider about genetic screening.  If you are at high risk for breast cancer, talk to your health care provider about having an MRI and a mammogram every year.  Breast cancer gene (BRCA) assessment is recommended for women who have family members with BRCA-related cancers. BRCA-related cancers include: ? Breast. ? Ovarian. ? Tubal. ? Peritoneal cancers.  Results of the assessment will determine the need for genetic counseling and BRCA1 and BRCA2 testing.  Cervical Cancer Your health care provider may recommend that you be screened regularly for cancer of the pelvic organs (ovaries, uterus, and vagina). This screening involves a pelvic examination, including checking for microscopic changes to the surface of your cervix (Pap test). You may be encouraged to have this screening done every 3 years, beginning at age 32.  For women ages 27-65, health care providers may recommend pelvic exams and Pap testing every 3 years, or they may recommend the Pap and pelvic exam, combined with testing for human papilloma virus (HPV), every 5 years. Some types of HPV increase your risk of cervical cancer. Testing for HPV may also be done on women of any age with unclear Pap test results.  Other health care providers may not recommend any screening for nonpregnant women who are considered low risk for pelvic cancer and who do not have symptoms. Ask your health care provider if a screening pelvic exam is right for you.  If you have had past treatment for cervical cancer or a condition that could lead to cancer, you need Pap tests and screening for cancer for at least 20 years after your treatment. If Pap tests have been discontinued, your risk factors (such as having a new sexual partner) need to be reassessed to determine if screening should resume. Some women have medical problems that increase the chance of getting cervical cancer. In these cases, your health care provider may recommend more frequent screening and  Pap tests.  Colorectal Cancer  This type of cancer can be detected and often prevented.  Routine colorectal cancer screening usually begins at 51 years of age and continues through 51 years of age.  Your health care provider may recommend screening at an earlier age if you have risk factors for colon cancer.  Your health care provider may also recommend using home test kits to check for hidden blood in the stool.  A small camera at the end of a tube can be used to examine your colon directly (sigmoidoscopy or colonoscopy). This is done to check for the earliest forms of colorectal cancer.  Routine screening usually begins at age 64.  Direct examination of the colon should be repeated every 5-10 years through 51 years of age. However, you may need to be screened more often if early forms of precancerous polyps or small growths are found.  Skin Cancer  Check your skin from head to toe regularly.  Tell your health care provider about any new moles or changes in moles, especially if there is a  change in a mole's shape or color.  Also tell your health care provider if you have a mole that is larger than the size of a pencil eraser.  Always use sunscreen. Apply sunscreen liberally and repeatedly throughout the day.  Protect yourself by wearing long sleeves, pants, a wide-brimmed hat, and sunglasses whenever you are outside.  Heart disease, diabetes, and high blood pressure  High blood pressure causes heart disease and increases the risk of stroke. High blood pressure is more likely to develop in: ? People who have blood pressure in the high end of the normal range (130-139/85-89 mm Hg). ? People who are overweight or obese. ? People who are African American.  If you are 59-56 years of age, have your blood pressure checked every 3-5 years. If you are 35 years of age or older, have your blood pressure checked every year. You should have your blood pressure measured twice-once when you  are at a hospital or clinic, and once when you are not at a hospital or clinic. Record the average of the two measurements. To check your blood pressure when you are not at a hospital or clinic, you can use: ? An automated blood pressure machine at a pharmacy. ? A home blood pressure monitor.  If you are between 87 years and 70 years old, ask your health care provider if you should take aspirin to prevent strokes.  Have regular diabetes screenings. This involves taking a blood sample to check your fasting blood sugar level. ? If you are at a normal weight and have a low risk for diabetes, have this test once every three years after 51 years of age. ? If you are overweight and have a high risk for diabetes, consider being tested at a younger age or more often. Preventing infection Hepatitis B  If you have a higher risk for hepatitis B, you should be screened for this virus. You are considered at high risk for hepatitis B if: ? You were born in a country where hepatitis B is common. Ask your health care provider which countries are considered high risk. ? Your parents were born in a high-risk country, and you have not been immunized against hepatitis B (hepatitis B vaccine). ? You have HIV or AIDS. ? You use needles to inject street drugs. ? You live with someone who has hepatitis B. ? You have had sex with someone who has hepatitis B. ? You get hemodialysis treatment. ? You take certain medicines for conditions, including cancer, organ transplantation, and autoimmune conditions.  Hepatitis C  Blood testing is recommended for: ? Everyone born from [birth year] through 1964. ? Anyone with known risk factors for hepatitis C.  Sexually transmitted infections (STIs)  You should be screened for sexually transmitted infections (STIs) including gonorrhea and chlamydia if: ? You are sexually active and are younger than 51 years of age. ? You are older than 51 years of age and your health care provider  tells you that you are at risk for this type of infection. ? Your sexual activity has changed since you were last screened and you are at an increased risk for chlamydia or gonorrhea. Ask your health care provider if you are at risk.  If you do not have HIV, but are at risk, it may be recommended that you take a prescription medicine daily to prevent HIV infection. This is called pre-exposure prophylaxis (PrEP). You are considered at risk if: ? You are sexually active and do not regularly  use condoms or know the HIV status of your partner(s). ? You take drugs by injection. ? You are sexually active with a partner who has HIV.  Talk with your health care provider about whether you are at high risk of being infected with HIV. If you choose to begin PrEP, you should first be tested for HIV. You should then be tested every 3 months for as long as you are taking PrEP. Pregnancy  If you are premenopausal and you may become pregnant, ask your health care provider about preconception counseling.  If you may become pregnant, take 400 to 800 micrograms (mcg) of folic acid every day.  If you want to prevent pregnancy, talk to your health care provider about birth control (contraception). Osteoporosis and menopause  Osteoporosis is a disease in which the bones lose minerals and strength with aging. This can result in serious bone fractures. Your risk for osteoporosis can be identified using a bone density scan.  If you are 65 years of age or older, or if you are at risk for osteoporosis and fractures, ask your health care provider if you should be screened.  Ask your health care provider whether you should take a calcium or vitamin D supplement to lower your risk for osteoporosis.  Menopause may have certain physical symptoms and risks.  Hormone replacement therapy may reduce some of these symptoms and risks. Talk to your health care provider about whether hormone replacement therapy is right for  you. Follow these instructions at home:  Schedule regular health, dental, and eye exams.  Stay current with your immunizations.  Do not use any tobacco products including cigarettes, chewing tobacco, or electronic cigarettes.  If you are pregnant, do not drink alcohol.  If you are breastfeeding, limit how much and how often you drink alcohol.  Limit alcohol intake to no more than 1 drink per day for nonpregnant women. One drink equals 12 ounces of beer, 5 ounces of wine, or 1 ounces of hard liquor.  Do not use street drugs.  Do not share needles.  Ask your health care provider for help if you need support or information about quitting drugs.  Tell your health care provider if you often feel depressed.  Tell your health care provider if you have ever been abused or do not feel safe at home. This information is not intended to replace advice given to you by your health care provider. Make sure you discuss any questions you have with your health care provider. Document Released: 02/21/2011 Document Revised: 01/14/2016 Document Reviewed: 05/12/2015 Elsevier Interactive Patient Education  2018 [employer] Inc[employer]  

## 2018-02-20 NOTE — Progress Notes (Signed)
Subjective:    Patient ID: Kathleen Solis, female    DOB: 1966-01-23, 52 y.o.   MRN: [id number]  HPI She is here for a physical exam.   She is not sleeping good.  She is taking benadryl.    Toe pain:  She had surgery on her left first toe for bunion and ingrown toenail.  Toenails are becoming dark.  Pain plantar surface of tips of toes - throbbing pain, sometimes tingles.  That started a while ago.  She denies back pain.    Abdominal pain:  She is having lower abdominal pain like she had before and it was constipation.  Last BM yesterday.  She feels like she is constipated.  Colace, miralax not effective in past.  She is not exercising.  She drinks a lot of water.  She eats veges and fruits. She was on linzess 145 mcg in the past and that worked, but may need a higher dose.        Medications and allergies reviewed with patient and updated if appropriate.  Patient Active Problem List   Diagnosis Date Noted  . Pelvic pain 10/04/2017  . Left ovarian cyst 10/04/2017  . Nausea 06/07/2017  . Dark stools 06/07/2017  . Hyperlipidemia 02/14/2017  . Chest pain 02/14/2017  . Hyperglycemia 02/14/2017  . Generalized abdominal pain 09/13/2016  . Constipation 09/13/2016  . Dysphagia 08/16/2016  . Great toe pain, left 08/16/2016  . Allergic rhinitis 08/16/2016  . Carotid arterial disease (HCC) 04/10/2016  . Diverticulitis of colon without hemorrhage 02/25/2016  . Bunion of left foot 02/08/2016  . Bone spur of right foot 02/08/2016  . Leg neuralgia, right 02/08/2016  . Episodic tension type headache 01/12/2016  . Benign essential HTN 03/03/2015  . History of cerebrovascular accident with residual deficit 03/03/2015    Current Outpatient Medications on File Prior to Visit  Medication Sig Dispense Refill  . atorvastatin (LIPITOR) 10 MG tablet Take 1 tablet (10 mg total) by mouth daily. 30 tablet 6  . diphenhydrAMINE (BENADRYL) 12.5 MG/5ML liquid Take by mouth 4 (four) times daily as  needed.    . hydrochlorothiazide (HYDRODIURIL) 25 MG tablet TAKE 25 MG BY MOUTH ONCE DAILY (Patient taking differently: Take 25 mg by mouth every evening. ) 90 tablet 1  . metoprolol succinate (TOPROL-XL) 100 MG 24 hr tablet TAKE 100 MG BY MOUTH ONCE DAILY WITH OR IMMEDIATELY FOLLOWING A  MEAL (Patient taking differently: Take 100 mg by mouth every evening. TAKE 100 MG BY MOUTH ONCE DAILY WITH OR IMMEDIATELY FOLLOWING A  MEAL) 90 tablet 1  . aspirin EC 81 MG tablet Take 81 mg by mouth daily.     No current facility-administered medications on file prior to visit.     Past Medical History:  Diagnosis Date  . Diverticulitis   . Hyperlipidemia    diet controlled - no med  . Hypertension   . Seasonal allergies   . Stroke [facility]) 2000   several mini strokes, weakness on right arm and leg  . SVD (spontaneous vaginal delivery)    x 2    Past Surgical History:  Procedure Laterality Date  . ABDOMINAL HYSTERECTOMY     partial  . ANAL FISSURE REPAIR    . APPENDECTOMY    . CHOLECYSTECTOMY    . CYSTOSCOPY N/A 10/04/2017   Procedure: CYSTOSCOPY;  Surgeon: Gerald Leitz, MD;  Location: [facility];  Service: Gynecology;  Laterality: N/A;  . FOOT SURGERY Left    big toe  bunion removed  . LAPAROSCOPIC BILATERAL SALPINGO OOPHERECTOMY Bilateral 10/04/2017   Procedure: LAPAROSCOPIC BILATERAL SALPINGO OOPHORECTOMY, PELVIC WASHINGS;  Surgeon: Gerald Leitz, MD;  Location: [facility];  Service: Gynecology;  Laterality: Bilateral;  . TUBAL LIGATION    . WISDOM TOOTH EXTRACTION      Social History   Socioeconomic History  . Marital status: Single    Spouse name: Not on file  . Number of children: 2  . Years of education: Not on file  . Highest education level: Associate degree: academic program  Occupational History  . Occupation: disabled  Social Needs  . Financial resource strain: Not on file  . Food insecurity:    Worry: Not on file    Inability: Not on file  . Transportation needs:    Medical: Not on  file    Non-medical: Not on file  Tobacco Use  . Smoking status: Never Smoker  . Smokeless tobacco: Never Used  Substance and Sexual Activity  . Alcohol use: No  . Drug use: No  . Sexual activity: Not Currently    Birth control/protection: Surgical  Lifestyle  . Physical activity:    Days per week: Not on file    Minutes per session: Not on file  . Stress: Not on file  Relationships  . Social connections:    Talks on phone: Not on file    Gets together: Not on file    Attends religious service: Not on file    Active member of club or organization: Not on file    Attends meetings of clubs or organizations: Not on file    Relationship status: Not on file  Other Topics Concern  . Not on file  Social History Narrative   No regular exercise      Pt is right handed, she occasionally drinks tea, walks QOD. She lives with her 35 yr old son, he has mild cerebral palsy.    Family History  Problem Relation Age of Onset  . Breast cancer Sister   . Diabetes Sister   . Glaucoma Sister   . Cataracts Sister   . Hypertension Sister   . Anuerysm Mother   . Pancreatic cancer Father   . Retinal detachment Brother   . Cataracts Sister     Review of Systems  Constitutional: Negative for chills and fever.  Eyes: Negative for visual disturbance.  Respiratory: Negative for cough, shortness of breath and wheezing.   Cardiovascular: Negative for chest pain, palpitations and leg swelling.  Gastrointestinal: Positive for abdominal pain (across the lower abdomen), constipation and nausea. Negative for abdominal distention, blood in stool, diarrhea and vomiting.       No gerd  Genitourinary: Negative for dysuria and hematuria.  Musculoskeletal: Negative for arthralgias and back pain.       Right leg pain from previous stroke  Skin: Negative for color change and rash.  Neurological: Positive for light-headedness (occ) and headaches (occ). Negative for numbness (tingling toes; intermittent  tingling in right leg from prior CVA).  Psychiatric/Behavioral: Positive for sleep disturbance. Negative for dysphoric mood. The patient is not nervous/anxious.        Objective:   Vitals:   02/21/18 1108  BP: 112/78  Temp: 97.9 F (36.6 C)   Filed Weights   02/21/18 1108  Weight: 179 lb (81.2 kg)   Body mass index is 29.79 kg/m.  Wt Readings from Last 3 Encounters:  02/21/18 179 lb (81.2 kg)  12/26/17 186 lb (84.4 kg)  09/29/17 182  lb 6 oz (82.7 kg)     Physical Exam Constitutional: She appears well-developed and well-nourished. No distress.  HENT:  Head: Normocephalic and atraumatic.  Right Ear: External ear normal. Normal ear canal and TM Left Ear: External ear normal.  Normal ear canal and TM Mouth/Throat: Oropharynx is clear and moist.  Eyes: Conjunctivae and EOM are normal.  Neck: Neck supple. No tracheal deviation present. No thyromegaly present.  No carotid bruit  Cardiovascular: Normal rate, regular rhythm and normal heart sounds.   No murmur heard.  No edema. Pulmonary/Chest: Effort normal and breath sounds normal. No respiratory distress. She has no wheezes. She has no rales.  Breast: deferred to Gyn Abdominal: Soft. She exhibits no distension. There is tenderness that is diffuse w/o rebound or guarding, No HSM Lymphadenopathy: She has no cervical adenopathy.  Skin: Skin is warm and dry. She is not diaphoretic.  Toenails:  First toenails b/l thickened, all nails slightly darker Psychiatric: She has a normal mood and affect. Her behavior is normal.        Assessment & Plan:   Physical exam: Screening blood work  ordered Immunizations  Deferred tdap, discussed shingrix, others up to date Colonoscopy   Up to date  Mammogram   Up to date  Gyn   Up to date  - Dr Gerald Leitzara [facility] exams  Not up to date EKG    Done 01/2017 Exercise  None - stressed regular exercise Weight   encouraged weight loss Skin nails with onychomycosis, no skin concerns  - will have  moles checked by derm Substance abuse   none  See Problem List for Assessment and Plan of chronic medical problems.

## 2018-02-21 ENCOUNTER — Ambulatory Visit (INDEPENDENT_AMBULATORY_CARE_PROVIDER_SITE_OTHER): Payer: Medicare Other | Admitting: Internal Medicine

## 2018-02-21 ENCOUNTER — Encounter: Payer: Self-pay | Admitting: Internal Medicine

## 2018-02-21 ENCOUNTER — Other Ambulatory Visit (INDEPENDENT_AMBULATORY_CARE_PROVIDER_SITE_OTHER): Payer: Medicare Other

## 2018-02-21 VITALS — BP 112/78 | Temp 97.9°F | Ht 65.0 in | Wt 179.0 lb

## 2018-02-21 DIAGNOSIS — I10 Essential (primary) hypertension: Secondary | ICD-10-CM

## 2018-02-21 DIAGNOSIS — Z114 Encounter for screening for human immunodeficiency virus [HIV]: Secondary | ICD-10-CM | POA: Diagnosis not present

## 2018-02-21 DIAGNOSIS — I6523 Occlusion and stenosis of bilateral carotid arteries: Secondary | ICD-10-CM | POA: Diagnosis not present

## 2018-02-21 DIAGNOSIS — Z0000 Encounter for general adult medical examination without abnormal findings: Secondary | ICD-10-CM

## 2018-02-21 DIAGNOSIS — R202 Paresthesia of skin: Secondary | ICD-10-CM | POA: Insufficient documentation

## 2018-02-21 DIAGNOSIS — B351 Tinea unguium: Secondary | ICD-10-CM

## 2018-02-21 DIAGNOSIS — E7849 Other hyperlipidemia: Secondary | ICD-10-CM

## 2018-02-21 DIAGNOSIS — I6930 Unspecified sequelae of cerebral infarction: Secondary | ICD-10-CM

## 2018-02-21 DIAGNOSIS — R739 Hyperglycemia, unspecified: Secondary | ICD-10-CM

## 2018-02-21 DIAGNOSIS — K5900 Constipation, unspecified: Secondary | ICD-10-CM

## 2018-02-21 HISTORY — DX: Tinea unguium: B35.1

## 2018-02-21 LAB — COMPREHENSIVE METABOLIC PANEL
ALT: 9 U/L (ref 0–35)
AST: 14 U/L (ref 0–37)
Albumin: 4.4 g/dL (ref 3.5–5.2)
Alkaline Phosphatase: 88 U/L (ref 39–117)
BUN: 10 mg/dL (ref 6–23)
CO2: 31 mEq/L (ref 19–32)
Calcium: 10 mg/dL (ref 8.4–10.5)
Chloride: 101 mEq/L (ref 96–112)
Creatinine, Ser: 0.76 mg/dL (ref 0.40–1.20)
GFR: 103.1 mL/min (ref >60.00)
Glucose, Bld: 104 mg/dL — ABNORMAL HIGH (ref 70–99)
Potassium: 3.8 mEq/L (ref 3.5–5.1)
Sodium: 141 mEq/L (ref 135–145)
Total Bilirubin: 1.2 mg/dL (ref 0.2–1.2)
Total Protein: 7.8 g/dL (ref 6.0–8.3)

## 2018-02-21 LAB — CBC WITH DIFFERENTIAL/PLATELET
Basophils Absolute: 0.1 10*3/uL (ref 0.0–0.1)
Basophils Relative: 0.8 % (ref 0.0–3.0)
Eosinophils Absolute: 0.1 10*3/uL (ref 0.0–0.7)
Eosinophils Relative: 0.9 % (ref 0.0–5.0)
HCT: 41.3 % (ref 36.0–46.0)
Hemoglobin: 14.5 g/dL (ref 12.0–15.0)
Lymphocytes Relative: 22.3 % (ref 12.0–46.0)
Lymphs Abs: 1.7 10*3/uL (ref 0.7–4.0)
MCHC: 35.1 g/dL (ref 30.0–36.0)
MCV: 90.8 fl (ref 78.0–100.0)
Monocytes Absolute: 0.4 10*3/uL (ref 0.1–1.0)
Monocytes Relative: 5.4 % (ref 3.0–12.0)
Neutro Abs: 5.2 10*3/uL (ref 1.4–7.7)
Neutrophils Relative %: 70.6 % (ref 43.0–77.0)
Platelets: 221 10*3/uL (ref 150.0–400.0)
RBC: 4.55 Mil/uL (ref 3.87–5.11)
RDW: 13.4 % (ref 11.5–15.5)
WBC: 7.4 10*3/uL (ref 4.0–10.5)

## 2018-02-21 LAB — LIPID PANEL
Cholesterol: 138 mg/dL (ref 0–200)
HDL: 54.3 mg/dL (ref >39.00)
LDL Cholesterol: 68 mg/dL (ref 0–99)
NonHDL: 84.03
Total CHOL/HDL Ratio: 3
Triglycerides: 79 mg/dL (ref 0.0–149.0)
VLDL: 15.8 mg/dL (ref 0.0–40.0)

## 2018-02-21 LAB — HEMOGLOBIN A1C: Hgb A1c MFr Bld: 4.8 % (ref 4.6–6.5)

## 2018-02-21 LAB — TSH: TSH: 1.8 u[IU]/mL (ref 0.35–4.50)

## 2018-02-21 LAB — VITAMIN B12: Vitamin B-12: 232 pg/mL (ref 211–911)

## 2018-02-21 MED ORDER — HYDROCHLOROTHIAZIDE 25 MG PO TABS: ORAL_TABLET | ORAL | 1 refills | Status: DC

## 2018-02-21 MED ORDER — CICLOPIROX 8 % EX SOLN: Freq: Every day | CUTANEOUS | 5 refills | Status: DC

## 2018-02-21 MED ORDER — METOPROLOL SUCCINATE ER 100 MG PO TB24: ORAL_TABLET | ORAL | 1 refills | Status: DC

## 2018-02-21 MED ORDER — LINACLOTIDE 290 MCG PO CAPS: 290.0000 ug | ORAL_CAPSULE | Freq: Every day | ORAL | 5 refills | Status: DC

## 2018-02-21 NOTE — Assessment & Plan Note (Signed)
a1c

## 2018-02-21 NOTE — Assessment & Plan Note (Signed)
BP well controlled Current regimen effective and well tolerated Continue current medications at current doses cmp  

## 2018-02-21 NOTE — Assessment & Plan Note (Signed)
Following with dr Everlena Cooperjaffe Continue ASA 81 mg and atorvastatin BP well controlled Encouraged regular exercise

## 2018-02-21 NOTE — Assessment & Plan Note (Signed)
Toenails Ciclopirox prescribed

## 2018-02-21 NOTE — Assessment & Plan Note (Signed)
Check lipid panel  Continue daily statin Regular exercise and healthy diet encouraged  

## 2018-02-21 NOTE — Assessment & Plan Note (Signed)
Dr Everlena CooperJaffe ordered carotid US Continue lipitor, ASA

## 2018-02-21 NOTE — Assessment & Plan Note (Signed)
Associated with abd pain - has had similar episodes in the past Restart linzess if covered -try higher dose of 290 mcg Start regular exercise Retry stool softener, miralax and fiber supplement

## 2018-02-21 NOTE — Assessment & Plan Note (Addendum)
Going on for a while Pain, tingling plantar surface of tips of toes only Check B12 level, a1c Can discuss with neuro at next visit

## 2018-02-22 LAB — HIV ANTIBODY (ROUTINE TESTING W REFLEX): HIV 1&2 Ab, 4th Generation: NONREACTIVE

## 2018-02-23 ENCOUNTER — Encounter: Payer: Self-pay | Admitting: Internal Medicine

## 2018-02-23 ENCOUNTER — Encounter: Payer: Medicare Other | Admitting: Internal Medicine

## 2018-02-23 DIAGNOSIS — E538 Deficiency of other specified B group vitamins: Secondary | ICD-10-CM

## 2018-02-23 HISTORY — DX: Deficiency of other specified B group vitamins: E53.8

## 2018-04-03 ENCOUNTER — Encounter: Payer: Self-pay | Admitting: Internal Medicine

## 2018-04-03 ENCOUNTER — Ambulatory Visit: Payer: Medicare Other | Admitting: Internal Medicine

## 2018-04-03 VITALS — BP 118/78 | HR 82 | Temp 97.9°F | Ht 65.0 in | Wt 174.0 lb

## 2018-04-03 DIAGNOSIS — J069 Acute upper respiratory infection, unspecified: Secondary | ICD-10-CM | POA: Diagnosis not present

## 2018-04-03 MED ORDER — PROMETHAZINE-DM 6.25-15 MG/5ML PO SYRP: 5.0000 mL | ORAL_SOLUTION | Freq: Four times a day (QID) | ORAL | 0 refills | Status: DC | PRN

## 2018-04-03 NOTE — Progress Notes (Signed)
Subjective:    Patient ID: Kathleen Solis, female    DOB: 01-02-1966, 52 y.o.   MRN: [id number]  HPI The patient is here for an acute visit for cough and headaches x 3 days.   Her symptoms started 3 days ago.   She is experiencing decreased appetite, fatigue, nasal congestion, runny nose, sinus pain, sore throat, hoarseness, cough - dry, chest pain with cough, nausea, body aches and headaches.     She has not tried taking anything for her symptoms.      Medications and allergies reviewed with patient and updated if appropriate.  Patient Active Problem List   Diagnosis Date Noted  . B12 deficiency 02/23/2018  . Tingling 02/21/2018  . Onychomycosis 02/21/2018  . Left ovarian cyst 10/04/2017  . Nausea 06/07/2017  . Dark stools 06/07/2017  . Hyperlipidemia 02/14/2017  . Chest pain 02/14/2017  . Hyperglycemia 02/14/2017  . Generalized abdominal pain 09/13/2016  . Constipation 09/13/2016  . Great toe pain, left 08/16/2016  . Allergic rhinitis 08/16/2016  . Carotid arterial disease (HCC) 04/10/2016  . Diverticulitis of colon without hemorrhage 02/25/2016  . Bunion of left foot 02/08/2016  . Bone spur of right foot 02/08/2016  . Leg neuralgia, right 02/08/2016  . Episodic tension type headache 01/12/2016  . Benign essential HTN 03/03/2015  . History of cerebrovascular accident with residual deficit 03/03/2015    Current Outpatient Medications on File Prior to Visit  Medication Sig Dispense Refill  . aspirin EC 81 MG tablet Take 81 mg by mouth daily.    Marland Kitchen. atorvastatin (LIPITOR) 10 MG tablet Take 1 tablet (10 mg total) by mouth daily. 30 tablet 6  . diphenhydrAMINE (BENADRYL) 12.5 MG/5ML liquid Take by mouth 4 (four) times daily as needed.    . hydrochlorothiazide (HYDRODIURIL) 25 MG tablet TAKE 25 MG BY MOUTH ONCE DAILY 90 tablet 1  . metoprolol succinate (TOPROL-XL) 100 MG 24 hr tablet TAKE 100 MG BY MOUTH ONCE DAILY WITH OR IMMEDIATELY FOLLOWING A  MEAL 90 tablet 1  .  ciclopirox (PENLAC) 8 % solution Apply topically at bedtime. Apply over nail & surrounding skin. Apply daily over previous coat. After 7 days, may remove w alcohol, continue (Patient not taking: Reported on 04/03/2018) 6.6 mL 5  . linaclotide (LINZESS) 290 MCG CAPS capsule Take 1 capsule (290 mcg total) by mouth daily before breakfast. (Patient not taking: Reported on 04/03/2018) 30 capsule 5   No current facility-administered medications on file prior to visit.     Past Medical History:  Diagnosis Date  . Diverticulitis   . Hyperlipidemia    diet controlled - no med  . Hypertension   . Seasonal allergies   . Stroke [facility](HCC) 2000   several mini strokes, weakness on right arm and leg  . SVD (spontaneous vaginal delivery)    x 2    Past Surgical History:  Procedure Laterality Date  . ABDOMINAL HYSTERECTOMY     partial  . ANAL FISSURE REPAIR    . APPENDECTOMY    . CHOLECYSTECTOMY    . CYSTOSCOPY N/A 10/04/2017   Procedure: CYSTOSCOPY;  Surgeon: Gerald Leitzole, Tara, MD;  Location: [facility];  Service: Gynecology;  Laterality: N/A;  . FOOT SURGERY Left    big toe bunion removed  . LAPAROSCOPIC BILATERAL SALPINGO OOPHERECTOMY Bilateral 10/04/2017   Procedure: LAPAROSCOPIC BILATERAL SALPINGO OOPHORECTOMY, PELVIC WASHINGS;  Surgeon: Gerald Leitzole, Tara, MD;  Location: [facility];  Service: Gynecology;  Laterality: Bilateral;  . TUBAL LIGATION    .  WISDOM TOOTH EXTRACTION      Social History   Socioeconomic History  . Marital status: Single    Spouse name: Not on file  . Number of children: 2  . Years of education: Not on file  . Highest education level: Associate degree: academic program  Occupational History  . Occupation: disabled  Social Needs  . Financial resource strain: Not on file  . Food insecurity:    Worry: Not on file    Inability: Not on file  . Transportation needs:    Medical: Not on file    Non-medical: Not on file  Tobacco Use  . Smoking status: Never Smoker  . Smokeless tobacco:  Never Used  Substance and Sexual Activity  . Alcohol use: No  . Drug use: No  . Sexual activity: Not Currently    Birth control/protection: Surgical  Lifestyle  . Physical activity:    Days per week: Not on file    Minutes per session: Not on file  . Stress: Not on file  Relationships  . Social connections:    Talks on phone: Not on file    Gets together: Not on file    Attends religious service: Not on file    Active member of club or organization: Not on file    Attends meetings of clubs or organizations: Not on file    Relationship status: Not on file  Other Topics Concern  . Not on file  Social History Narrative   No regular exercise      Pt is right handed, she occasionally drinks tea, walks QOD. She lives with her 51 yr old son, he has mild cerebral palsy.    Family History  Problem Relation Age of Onset  . Breast cancer Sister   . Diabetes Sister   . Glaucoma Sister   . Cataracts Sister   . Hypertension Sister   . Anuerysm Mother   . Pancreatic cancer Father   . Retinal detachment Brother   . Cataracts Sister     Review of Systems  Constitutional: Positive for appetite change (dec) and fatigue. Negative for fever (feels hot at times).  HENT: Positive for congestion, rhinorrhea, sinus pressure, sinus pain, sore throat and voice change. Negative for ear pain (pressure - feels like something is in it).   Respiratory: Positive for cough. Negative for shortness of breath and wheezing.   Cardiovascular: Positive for chest pain (before she took her shower this morning). Negative for palpitations.  Gastrointestinal: Positive for nausea. Negative for abdominal pain and diarrhea.  Musculoskeletal: Positive for myalgias.  Neurological: Positive for headaches (top of head and frontal region).       Objective:   Vitals:   04/03/18 1529  BP: 118/78  Pulse: 82  Temp: 97.9 F (36.6 C)  SpO2: 95%   BP Readings from Last 3 Encounters:  04/03/18 118/78  02/21/18  112/78  12/26/17 118/78   Wt Readings from Last 3 Encounters:  04/03/18 174 lb (78.9 kg)  02/21/18 179 lb (81.2 kg)  12/26/17 186 lb (84.4 kg)   Body mass index is 28.96 kg/m.   Physical Exam    GENERAL APPEARANCE: Appears stated age, well appearing, NAD EYES: conjunctiva clear, no icterus HEENT: bilateral tympanic membranes and ear canals normal, oropharynx with minimal erythema, no thyromegaly, trachea midline, no cervical or supraclavicular lymphadenopathy LUNGS: Clear to auscultation without wheeze or crackles, unlabored breathing, good air entry bilaterally CARDIOVASCULAR: Normal S1,S2 without murmurs, no edema SKIN: Warm, dry  Assessment & Plan:    See Problem List for Assessment and Plan of chronic medical problems.

## 2018-04-03 NOTE — Assessment & Plan Note (Signed)
Viral in nature No abx needed prometh - dextro cough syrup Tylenol for pain, headaches otc cold meds prn Rest, fluids  Call if no improvement

## 2018-04-03 NOTE — Patient Instructions (Addendum)
Your symptoms are consistent with a viral infection.  Take Tylenol as needed for your headaches and aches.   Take the cough syrup as needed.    Rest, fluids and call if no improvement      Upper Respiratory Infection, Adult Most upper respiratory infections (URIs) are a viral infection of the air passages leading to the lungs. A URI affects the nose, throat, and upper air passages. The most common type of URI is nasopharyngitis and is typically referred to as "the common cold." URIs run their course and usually go away on their own. Most of the time, a URI does not require medical attention, but sometimes a bacterial infection in the upper airways can follow a viral infection. This is called a secondary infection. Sinus and middle ear infections are common types of secondary upper respiratory infections. Bacterial pneumonia can also complicate a URI. A URI can worsen asthma and chronic obstructive pulmonary disease (COPD). Sometimes, these complications can require emergency medical care and may be life threatening. What are the causes? Almost all URIs are caused by viruses. A virus is a type of germ and can spread from one person to another. What increases the risk? You may be at risk for a URI if:  You smoke.  You have chronic heart or lung disease.  You have a weakened defense (immune) system.  You are very young or very old.  You have nasal allergies or asthma.  You work in crowded or poorly ventilated areas.  You work in health care facilities or schools.  What are the signs or symptoms? Symptoms typically develop 2-3 days after you come in contact with a cold virus. Most viral URIs last 7-10 days. However, viral URIs from the influenza virus (flu virus) can last 14-18 days and are typically more severe. Symptoms may include:  Runny or stuffy (congested) nose.  Sneezing.  Cough.  Sore throat.  Headache.  Fatigue.  Fever.  Loss of appetite.  Pain in your  forehead, behind your eyes, and over your cheekbones (sinus pain).  Muscle aches.  How is this diagnosed? Your health care provider may diagnose a URI by:  Physical exam.  Tests to check that your symptoms are not due to another condition such as: ? Strep throat. ? Sinusitis. ? Pneumonia. ? Asthma.  How is this treated? A URI goes away on its own with time. It cannot be cured with medicines, but medicines may be prescribed or recommended to relieve symptoms. Medicines may help:  Reduce your fever.  Reduce your cough.  Relieve nasal congestion.  Follow these instructions at home:  Take medicines only as directed by your health care provider.  Gargle warm saltwater or take cough drops to comfort your throat as directed by your health care provider.  Use a warm mist humidifier or inhale steam from a shower to increase air moisture. This may make it easier to breathe.  Drink enough fluid to keep your urine clear or pale yellow.  Eat soups and other clear broths and maintain good nutrition.  Rest as needed.  Return to work when your temperature has returned to normal or as your health care provider advises. You may need to stay home longer to avoid infecting others. You can also use a face mask and careful hand washing to prevent spread of the virus.  Increase the usage of your inhaler if you have asthma.  Do not use any tobacco products, including cigarettes, chewing tobacco, or electronic cigarettes. If you  need help quitting, ask your health care provider. How is this prevented? The best way to protect yourself from getting a cold is to practice good hygiene.  Avoid oral or hand contact with people with cold symptoms.  Wash your hands often if contact occurs.  There is no clear evidence that vitamin C, vitamin E, echinacea, or exercise reduces the chance of developing a cold. However, it is always recommended to get plenty of rest, exercise, and practice good  nutrition. Contact a health care provider if:  You are getting worse rather than better.  Your symptoms are not controlled by medicine.  You have chills.  You have worsening shortness of breath.  You have brown or red mucus.  You have yellow or brown nasal discharge.  You have pain in your face, especially when you bend forward.  You have a fever.  You have swollen neck glands.  You have pain while swallowing.  You have white areas in the back of your throat. Get help right away if:  You have severe or persistent: ? Headache. ? Ear pain. ? Sinus pain. ? Chest pain.  You have chronic lung disease and any of the following: ? Wheezing. ? Prolonged cough. ? Coughing up blood. ? A change in your usual mucus.  You have a stiff neck.  You have changes in your: ? Vision. ? Hearing. ? Thinking. ? Mood. This information is not intended to replace advice given to you by your health care provider. Make sure you discuss any questions you have with your health care provider. Document Released: 02/01/2001 Document Revised: 04/10/2016 Document Reviewed: 11/13/2013 Elsevier Interactive Patient Education  [employer]2018 [employer] Inc[employer]

## 2018-05-07 NOTE — Progress Notes (Signed)
Subjective:    Patient ID: Kathleen Solis, female    DOB: May 05, 1966, 52 y.o.   MRN: [id number]  HPI The patient is here for an acute visit.   She is here for an acute visit for cold symptoms.   Her symptoms started 3 days ago  She is experiencing sweating, aches, cough.  Her cough is primarily dry.  She does have some tightness in her chest and pain when she coughs.  She has a runny nose, nausea and headaches.  She is not had any fevers, nasal congestion, ear pain, sinus pain, sore throat or shortness of breath.  She denies diarrhea, lightheadedness and dizziness.  She has tried taking benadryl, tylenol.      Medications and allergies reviewed with patient and updated if appropriate.  Patient Active Problem List   Diagnosis Date Noted  . B12 deficiency 02/23/2018  . Tingling 02/21/2018  . Onychomycosis 02/21/2018  . Left ovarian cyst 10/04/2017  . Nausea 06/07/2017  . Dark stools 06/07/2017  . Hyperlipidemia 02/14/2017  . Chest pain 02/14/2017  . Hyperglycemia 02/14/2017  . URI (upper respiratory infection) 11/09/2016  . Generalized abdominal pain 09/13/2016  . Constipation 09/13/2016  . Great toe pain, left 08/16/2016  . Allergic rhinitis 08/16/2016  . Carotid arterial disease (HCC) 04/10/2016  . Diverticulitis of colon without hemorrhage 02/25/2016  . Bunion of left foot 02/08/2016  . Bone spur of right foot 02/08/2016  . Leg neuralgia, right 02/08/2016  . Episodic tension type headache 01/12/2016  . Benign essential HTN 03/03/2015  . History of cerebrovascular accident with residual deficit 03/03/2015    Current Outpatient Medications on File Prior to Visit  Medication Sig Dispense Refill  . aspirin EC 81 MG tablet Take 81 mg by mouth daily.    Marland Kitchen atorvastatin (LIPITOR) 10 MG tablet Take 1 tablet (10 mg total) by mouth daily. 30 tablet 6  . diphenhydrAMINE (BENADRYL) 12.5 MG/5ML liquid Take by mouth 4 (four) times daily as needed.    . hydrochlorothiazide  (HYDRODIURIL) 25 MG tablet TAKE 25 MG BY MOUTH ONCE DAILY 90 tablet 1  . metoprolol succinate (TOPROL-XL) 100 MG 24 hr tablet TAKE 100 MG BY MOUTH ONCE DAILY WITH OR IMMEDIATELY FOLLOWING A  MEAL 90 tablet 1   No current facility-administered medications on file prior to visit.     Past Medical History:  Diagnosis Date  . Diverticulitis   . Hyperlipidemia    diet controlled - no med  . Hypertension   . Seasonal allergies   . Stroke [facility]) 2000   several mini strokes, weakness on right arm and leg  . SVD (spontaneous vaginal delivery)    x 2    Past Surgical History:  Procedure Laterality Date  . ABDOMINAL HYSTERECTOMY     partial  . ANAL FISSURE REPAIR    . APPENDECTOMY    . CHOLECYSTECTOMY    . CYSTOSCOPY N/A 10/04/2017   Procedure: CYSTOSCOPY;  Surgeon: Gerald Leitz, MD;  Location: [facility];  Service: Gynecology;  Laterality: N/A;  . FOOT SURGERY Left    big toe bunion removed  . LAPAROSCOPIC BILATERAL SALPINGO OOPHERECTOMY Bilateral 10/04/2017   Procedure: LAPAROSCOPIC BILATERAL SALPINGO OOPHORECTOMY, PELVIC WASHINGS;  Surgeon: Gerald Leitz, MD;  Location: [facility];  Service: Gynecology;  Laterality: Bilateral;  . TUBAL LIGATION    . WISDOM TOOTH EXTRACTION      Social History   Socioeconomic History  . Marital status: Single    Spouse name: Not on file  .  Number of children: 2  . Years of education: Not on file  . Highest education level: Associate degree: academic program  Occupational History  . Occupation: disabled  Social Needs  . Financial resource strain: Not on file  . Food insecurity:    Worry: Not on file    Inability: Not on file  . Transportation needs:    Medical: Not on file    Non-medical: Not on file  Tobacco Use  . Smoking status: Never Smoker  . Smokeless tobacco: Never Used  Substance and Sexual Activity  . Alcohol use: No  . Drug use: No  . Sexual activity: Not Currently    Birth control/protection: Surgical  Lifestyle  . Physical activity:      Days per week: Not on file    Minutes per session: Not on file  . Stress: Not on file  Relationships  . Social connections:    Talks on phone: Not on file    Gets together: Not on file    Attends religious service: Not on file    Active member of club or organization: Not on file    Attends meetings of clubs or organizations: Not on file    Relationship status: Not on file  Other Topics Concern  . Not on file  Social History Narrative   No regular exercise      Pt is right handed, she occasionally drinks tea, walks QOD. She lives with her 51 yr old son, he has mild cerebral palsy.    Family History  Problem Relation Age of Onset  . Breast cancer Sister   . Diabetes Sister   . Glaucoma Sister   . Cataracts Sister   . Hypertension Sister   . Anuerysm Mother   . Pancreatic cancer Father   . Retinal detachment Brother   . Cataracts Sister     Review of Systems  Constitutional: Positive for diaphoresis. Negative for fever.  HENT: Positive for rhinorrhea. Negative for congestion, ear pain, sinus pain and sore throat.   Respiratory: Positive for cough and chest tightness. Negative for shortness of breath.   Cardiovascular: Positive for chest pain (with cough and deep breaths).  Gastrointestinal: Positive for nausea. Negative for diarrhea.  Musculoskeletal: Positive for myalgias.  Neurological: Positive for headaches. Negative for dizziness and light-headedness.       Objective:   Vitals:   05/08/18 0916  BP: 124/80  Pulse: (!) 59  Resp: 16  Temp: 98.2 F (36.8 C)  SpO2: 96%   BP Readings from Last 3 Encounters:  05/08/18 124/80  04/03/18 118/78  02/21/18 112/78   Wt Readings from Last 3 Encounters:  05/08/18 178 lb (80.7 kg)  04/03/18 174 lb (78.9 kg)  02/21/18 179 lb (81.2 kg)   Body mass index is 29.62 kg/m.   Physical Exam    GENERAL APPEARANCE: Appears stated age, well appearing, NAD EYES: conjunctiva clear, no icterus HEENT: bilateral tympanic  membranes and ear canals normal, oropharynx with no erythema, no thyromegaly, trachea midline, no cervical or supraclavicular lymphadenopathy LUNGS: Clear to auscultation without wheeze or crackles, unlabored breathing, good air entry bilaterally CARDIOVASCULAR: Normal S1,S2 without murmurs, no edema SKIN: Warm, dry      Assessment & Plan:    See Problem List for Assessment and Plan of chronic medical problems.

## 2018-05-08 ENCOUNTER — Ambulatory Visit: Payer: Medicare Other | Admitting: Internal Medicine

## 2018-05-08 ENCOUNTER — Ambulatory Visit (INDEPENDENT_AMBULATORY_CARE_PROVIDER_SITE_OTHER)
Admission: RE | Admit: 2018-05-08 | Discharge: 2018-05-08 | Disposition: A | Discharge status: Home / Self Care | Payer: Medicare Other | Source: Ambulatory Visit | Attending: Internal Medicine | Admitting: Internal Medicine

## 2018-05-08 ENCOUNTER — Encounter: Payer: Self-pay | Admitting: Internal Medicine

## 2018-05-08 VITALS — BP 124/80 | HR 59 | Temp 98.2°F | Resp 16 | Ht 65.0 in | Wt 178.0 lb

## 2018-05-08 DIAGNOSIS — R059 Cough, unspecified: Secondary | ICD-10-CM | POA: Insufficient documentation

## 2018-05-08 DIAGNOSIS — R05 Cough: Secondary | ICD-10-CM

## 2018-05-08 DIAGNOSIS — R053 Chronic cough: Secondary | ICD-10-CM | POA: Insufficient documentation

## 2018-05-08 DIAGNOSIS — R52 Pain, unspecified: Secondary | ICD-10-CM

## 2018-05-08 MED ORDER — PROMETHAZINE-CODEINE 6.25-10 MG/5ML PO SYRP: 5.0000 mL | ORAL_SOLUTION | Freq: Four times a day (QID) | ORAL | 0 refills | Status: DC | PRN

## 2018-05-08 NOTE — Assessment & Plan Note (Signed)
Probable viral syndrome, but given cough, chest pain will get a chest x-ray to rule out early pneumonia We will start antibiotic if the chest x-ray shows infection-hold for now Promethazine-codeine cough syrup as needed Alternate Tylenol and Advil Continue Benadryl as needed and other over-the-counter cold medications Rest, fluids Call if no improvement

## 2018-05-08 NOTE — Patient Instructions (Signed)
Have a chest xray today.  We will call you later today with the results.     Take the cough syrup as needed.  Take tylenol alternating with advil.     Your prescription(s) have been submitted to your pharmacy. Please take as directed and contact our office if you believe you are having problem(s) with the medication(s).    Call if no improvement

## 2018-05-08 NOTE — Assessment & Plan Note (Signed)
Likely secondary to viral illness Stressed symptomatic treatment with Tylenol alternating with Advil Rest, fluids Call if no improvement

## 2018-05-10 ENCOUNTER — Encounter: Payer: Self-pay | Admitting: Internal Medicine

## 2018-05-10 ENCOUNTER — Ambulatory Visit: Payer: Medicare Other | Admitting: Internal Medicine

## 2018-05-10 ENCOUNTER — Ambulatory Visit (INDEPENDENT_AMBULATORY_CARE_PROVIDER_SITE_OTHER)
Admission: RE | Admit: 2018-05-10 | Discharge: 2018-05-10 | Disposition: A | Discharge status: Home / Self Care | Payer: Medicare Other | Source: Ambulatory Visit | Attending: Internal Medicine | Admitting: Internal Medicine

## 2018-05-10 VITALS — BP 112/72 | HR 65 | Temp 98.2°F | Resp 18 | Ht 65.0 in | Wt 179.8 lb

## 2018-05-10 DIAGNOSIS — M545 Low back pain, unspecified: Secondary | ICD-10-CM

## 2018-05-10 HISTORY — DX: Low back pain, unspecified: M54.50

## 2018-05-10 MED ORDER — CYCLOBENZAPRINE HCL 5 MG PO TABS: 5.0000 mg | ORAL_TABLET | Freq: Three times a day (TID) | ORAL | 0 refills | Status: DC | PRN

## 2018-05-10 NOTE — Patient Instructions (Addendum)
Have an xray today of your lower back.     Take the muscle relaxer three times a day as needed.  Try topical Biofreeze.    Ice the back.

## 2018-05-10 NOTE — Progress Notes (Signed)
Subjective:    Patient ID: Kathleen Solis, female    DOB: 12-08-1965, 52 y.o.   MRN: [id number]  HPI The patient is here for an acute visit.   Lower back pain:  She started having lower back pain that started three days ago.  She did not injure herself.  It is constant.  Nothing makes it better or worse.  She denies radiation of the pain into the legs. She denies numbness/tingling and muscle spasms.  She has been taking tylenol and it is not helping.  She denies prior episodes of back pain similar to this.  She still has the cold symptoms that she was here for two days ago.  The cough is worse at night.  Her cold symptoms are a little better overall.  She is taking a cough syrup as needed.   Medications and allergies reviewed with patient and updated if appropriate.  Patient Active Problem List   Diagnosis Date Noted  . Body aches 05/08/2018  . Cough 05/08/2018  . B12 deficiency 02/23/2018  . Tingling 02/21/2018  . Onychomycosis 02/21/2018  . Left ovarian cyst 10/04/2017  . Nausea 06/07/2017  . Dark stools 06/07/2017  . Hyperlipidemia 02/14/2017  . Chest pain 02/14/2017  . Hyperglycemia 02/14/2017  . URI (upper respiratory infection) 11/09/2016  . Generalized abdominal pain 09/13/2016  . Constipation 09/13/2016  . Great toe pain, left 08/16/2016  . Allergic rhinitis 08/16/2016  . Carotid arterial disease (HCC) 04/10/2016  . Diverticulitis of colon without hemorrhage 02/25/2016  . Bunion of left foot 02/08/2016  . Bone spur of right foot 02/08/2016  . Leg neuralgia, right 02/08/2016  . Episodic tension type headache 01/12/2016  . Benign essential HTN 03/03/2015  . History of cerebrovascular accident with residual deficit 03/03/2015    Current Outpatient Medications on File Prior to Visit  Medication Sig Dispense Refill  . aspirin EC 81 MG tablet Take 81 mg by mouth daily.    Marland Kitchen. atorvastatin (LIPITOR) 10 MG tablet Take 1 tablet (10 mg total) by mouth daily. 30 tablet 6    . diphenhydrAMINE (BENADRYL) 12.5 MG/5ML liquid Take by mouth 4 (four) times daily as needed.    . hydrochlorothiazide (HYDRODIURIL) 25 MG tablet TAKE 25 MG BY MOUTH ONCE DAILY 90 tablet 1  . metoprolol succinate (TOPROL-XL) 100 MG 24 hr tablet TAKE 100 MG BY MOUTH ONCE DAILY WITH OR IMMEDIATELY FOLLOWING A  MEAL 90 tablet 1  . promethazine-codeine (PHENERGAN WITH CODEINE) 6.25-10 MG/5ML syrup Take 5 mLs by mouth every 6 (six) hours as needed for cough. 118 mL 0   No current facility-administered medications on file prior to visit.     Past Medical History:  Diagnosis Date  . Diverticulitis   . Hyperlipidemia    diet controlled - no med  . Hypertension   . Seasonal allergies   . Stroke [facility](HCC) 2000   several mini strokes, weakness on right arm and leg  . SVD (spontaneous vaginal delivery)    x 2    Past Surgical History:  Procedure Laterality Date  . ABDOMINAL HYSTERECTOMY     partial  . ANAL FISSURE REPAIR    . APPENDECTOMY    . CHOLECYSTECTOMY    . CYSTOSCOPY N/A 10/04/2017   Procedure: CYSTOSCOPY;  Surgeon: Gerald Leitzole, Tara, MD;  Location: [facility];  Service: Gynecology;  Laterality: N/A;  . FOOT SURGERY Left    big toe bunion removed  . LAPAROSCOPIC BILATERAL SALPINGO OOPHERECTOMY Bilateral 10/04/2017   Procedure: LAPAROSCOPIC BILATERAL  SALPINGO OOPHORECTOMY, PELVIC WASHINGS;  Surgeon: Gerald Leitz, MD;  Location: [facility];  Service: Gynecology;  Laterality: Bilateral;  . TUBAL LIGATION    . WISDOM TOOTH EXTRACTION      Social History   Socioeconomic History  . Marital status: Single    Spouse name: Not on file  . Number of children: 2  . Years of education: Not on file  . Highest education level: Associate degree: academic program  Occupational History  . Occupation: disabled  Social Needs  . Financial resource strain: Not on file  . Food insecurity:    Worry: Not on file    Inability: Not on file  . Transportation needs:    Medical: Not on file    Non-medical: Not on  file  Tobacco Use  . Smoking status: Never Smoker  . Smokeless tobacco: Never Used  Substance and Sexual Activity  . Alcohol use: No  . Drug use: No  . Sexual activity: Not Currently    Birth control/protection: Surgical  Lifestyle  . Physical activity:    Days per week: Not on file    Minutes per session: Not on file  . Stress: Not on file  Relationships  . Social connections:    Talks on phone: Not on file    Gets together: Not on file    Attends religious service: Not on file    Active member of club or organization: Not on file    Attends meetings of clubs or organizations: Not on file    Relationship status: Not on file  Other Topics Concern  . Not on file  Social History Narrative   No regular exercise      Pt is right handed, she occasionally drinks tea, walks QOD. She lives with her 41 yr old son, he has mild cerebral palsy.    Family History  Problem Relation Age of Onset  . Breast cancer Sister   . Diabetes Sister   . Glaucoma Sister   . Cataracts Sister   . Hypertension Sister   . Anuerysm Mother   . Pancreatic cancer Father   . Retinal detachment Brother   . Cataracts Sister     Review of Systems  Constitutional: Negative for fever.  Respiratory: Positive for cough.   Musculoskeletal: Positive for back pain.       ?  Muscle spasms or tightness  Neurological: Negative for weakness and numbness.       Objective:   Vitals:   05/10/18 1101  BP: 112/72  Pulse: 65  Resp: 18  Temp: 98.2 F (36.8 C)  SpO2: 97%   BP Readings from Last 3 Encounters:  05/10/18 112/72  05/08/18 124/80  04/03/18 118/78   Wt Readings from Last 3 Encounters:  05/10/18 179 lb 12.8 oz (81.6 kg)  05/08/18 178 lb (80.7 kg)  04/03/18 174 lb (78.9 kg)   Body mass index is 29.92 kg/m.   Physical Exam  Constitutional: She appears well-developed and well-nourished. No distress.  HENT:  Head: Normocephalic and atraumatic.  Musculoskeletal:  Tenderness lumbar spine  with light palpation, no tenderness right lower back or left lower back, no SI joint tenderness bilaterally  Neurological:  Normal sensation bilateral lower extremities.  Normal strength bilateral lower extremities.  Skin: She is not diaphoretic.           Assessment & Plan:    See Problem List for Assessment and Plan of chronic medical problems.

## 2018-05-10 NOTE — Assessment & Plan Note (Signed)
Started 3 days ago and she states the pain is severe, 10/10 Tylenol not effective Unable to take NSAIDs secondary to ringing in her ear.  Unable to take prednisone because it makes her sick We will get an x-ray today Flexeril 3 times a day as needed-discussed that this can cause drowsiness.  She has taken it before. Advised applying Biofreeze and icing area I do not think she needs narcotics at this time Offered appointment with sports medicine tomorrow, but she declined because she does not want to pay the co-pay

## 2018-05-11 ENCOUNTER — Telehealth: Payer: Self-pay

## 2018-05-11 NOTE — Telephone Encounter (Signed)
PA started on CoverMyMeds KEY: [id number]

## 2018-05-13 NOTE — Progress Notes (Deleted)
Kathleen Solis - 52 y.o. female MRN [id number]  Date of birth: 02-Mar-1966  SUBJECTIVE:  Including CC & ROS.  No chief complaint on file.   Kathleen Solis is a 52 y.o. female that is  ***.  ***   Review of Systems  HISTORY: Past Medical, Surgical, Social, and Family History Reviewed & Updated per EMR.   Pertinent Historical Findings include:  Past Medical History:  Diagnosis Date  . Diverticulitis   . Hyperlipidemia    diet controlled - no med  . Hypertension   . Seasonal allergies   . Stroke [facility]) 2000   several mini strokes, weakness on right arm and leg  . SVD (spontaneous vaginal delivery)    x 2    Past Surgical History:  Procedure Laterality Date  . ABDOMINAL HYSTERECTOMY     partial  . ANAL FISSURE REPAIR    . APPENDECTOMY    . CHOLECYSTECTOMY    . CYSTOSCOPY N/A 10/04/2017   Procedure: CYSTOSCOPY;  Surgeon: Gerald Leitz, MD;  Location: [facility];  Service: Gynecology;  Laterality: N/A;  . FOOT SURGERY Left    big toe bunion removed  . LAPAROSCOPIC BILATERAL SALPINGO OOPHERECTOMY Bilateral 10/04/2017   Procedure: LAPAROSCOPIC BILATERAL SALPINGO OOPHORECTOMY, PELVIC WASHINGS;  Surgeon: Gerald Leitz, MD;  Location: [facility];  Service: Gynecology;  Laterality: Bilateral;  . TUBAL LIGATION    . WISDOM TOOTH EXTRACTION      Allergies  Allergen Reactions  . Darvon [Propoxyphene] Other (See Comments)    Gi problems  . Morphine And Related Itching  . Sulfa Antibiotics Hives  . Buprenorphine Hcl Itching  . Prochlorperazine Edisylate Itching and Other (See Comments)    "Acts weird: Hallucinations"    Family History  Problem Relation Age of Onset  . Breast cancer Sister   . Diabetes Sister   . Glaucoma Sister   . Cataracts Sister   . Hypertension Sister   . Anuerysm Mother   . Pancreatic cancer Father   . Retinal detachment Brother   . Cataracts Sister      Social History   Socioeconomic History  . Marital status: Single    Spouse name: Not on file  .  Number of children: 2  . Years of education: Not on file  . Highest education level: Associate degree: academic program  Occupational History  . Occupation: disabled  Social Needs  . Financial resource strain: Not on file  . Food insecurity:    Worry: Not on file    Inability: Not on file  . Transportation needs:    Medical: Not on file    Non-medical: Not on file  Tobacco Use  . Smoking status: Never Smoker  . Smokeless tobacco: Never Used  Substance and Sexual Activity  . Alcohol use: No  . Drug use: No  . Sexual activity: Not Currently    Birth control/protection: Surgical  Lifestyle  . Physical activity:    Days per week: Not on file    Minutes per session: Not on file  . Stress: Not on file  Relationships  . Social connections:    Talks on phone: Not on file    Gets together: Not on file    Attends religious service: Not on file    Active member of club or organization: Not on file    Attends meetings of clubs or organizations: Not on file    Relationship status: Not on file  . Intimate partner violence:    Fear of current or ex  partner: Not on file    Emotionally abused: Not on file    Physically abused: Not on file    Forced sexual activity: Not on file  Other Topics Concern  . Not on file  Social History Narrative   No regular exercise      Pt is right handed, she occasionally drinks tea, walks QOD. She lives with her 51 yr old son, he has mild cerebral palsy.     PHYSICAL EXAM:  VS: There were no vitals taken for this visit. Physical Exam Gen: NAD, alert, cooperative with exam, well-appearing ENT: normal lips, normal nasal mucosa,  Eye: normal EOM, normal conjunctiva and lids CV:  no edema, +2 pedal pulses   Resp: no accessory muscle use, non-labored,  GI: no masses or tenderness, no hernia  Skin: no rashes, no areas of induration  Neuro: normal tone, normal sensation to touch Psych:  normal insight, alert and oriented MSK:  ***       ASSESSMENT & PLAN:   No problem-specific Assessment & Plan notes found for this encounter.

## 2018-05-14 ENCOUNTER — Ambulatory Visit: Payer: Self-pay | Admitting: Family Medicine

## 2018-05-15 ENCOUNTER — Ambulatory Visit: Payer: Medicare Other | Admitting: Family Medicine

## 2018-05-15 ENCOUNTER — Encounter: Payer: Self-pay | Admitting: Family Medicine

## 2018-05-15 VITALS — BP 130/74 | HR 68 | Ht 65.0 in | Wt 179.0 lb

## 2018-05-15 DIAGNOSIS — M5416 Radiculopathy, lumbar region: Secondary | ICD-10-CM

## 2018-05-15 HISTORY — DX: Radiculopathy, lumbar region: M54.16

## 2018-05-15 MED ORDER — TRAMADOL HCL 50 MG PO TABS: 50.0000 mg | ORAL_TABLET | Freq: Three times a day (TID) | ORAL | 0 refills | Status: DC | PRN

## 2018-05-15 MED ORDER — GABAPENTIN 100 MG PO CAPS: 100.0000 mg | ORAL_CAPSULE | Freq: Three times a day (TID) | ORAL | 1 refills | Status: DC

## 2018-05-15 NOTE — Progress Notes (Signed)
Kathleen Solis - 52 y.o. female MRN [id number]  Date of birth: [birth year]  SUBJECTIVE:  Including CC & ROS.  Chief Complaint  Patient presents with  . Back Pain    Kathleen Solis is a 52 y.o. female that is presenting with back pain. Ongoing for one week. Pain is located at her lower back and radiates down her left leg. She does not recall doing anything to trigger the pain. Denies injury or prior surgeries. She has been applying ice/heat and applying topical analgesic. Pain is mild to severe, constant. Denies sitting or standing exacerbates the pain. She was prescribed prednisone but could not take it due to headaches. Flexeril has not helped, she has discontinued taking it.   Independent review of the lumbar x-rays from 9/19 shows mild degenerative changes.   Review of Systems  Constitutional: Negative for fever.  HENT: Negative for congestion.   Respiratory: Negative for cough.   Cardiovascular: Negative for chest pain.  Gastrointestinal: Negative for abdominal pain.  Musculoskeletal: Positive for back pain.  Skin: Negative for color change.  Neurological: Negative for weakness.  Hematological: Negative for adenopathy.  Psychiatric/Behavioral: Negative for agitation.    HISTORY: Past Medical, Surgical, Social, and Family History Reviewed & Updated per EMR.   Pertinent Historical Findings include:  Past Medical History:  Diagnosis Date  . Diverticulitis   . Hyperlipidemia    diet controlled - no med  . Hypertension   . Seasonal allergies   . Stroke [facility]) 2000   several mini strokes, weakness on right arm and leg  . SVD (spontaneous vaginal delivery)    x 2    Past Surgical History:  Procedure Laterality Date  . ABDOMINAL HYSTERECTOMY     partial  . ANAL FISSURE REPAIR    . APPENDECTOMY    . CHOLECYSTECTOMY    . CYSTOSCOPY N/A 10/04/2017   Procedure: CYSTOSCOPY;  Surgeon: Gerald Leitz, MD;  Location: [facility];  Service: Gynecology;  Laterality: N/A;  . FOOT SURGERY  Left    big toe bunion removed  . LAPAROSCOPIC BILATERAL SALPINGO OOPHERECTOMY Bilateral 10/04/2017   Procedure: LAPAROSCOPIC BILATERAL SALPINGO OOPHORECTOMY, PELVIC WASHINGS;  Surgeon: Gerald Leitz, MD;  Location: [facility];  Service: Gynecology;  Laterality: Bilateral;  . TUBAL LIGATION    . WISDOM TOOTH EXTRACTION      Allergies  Allergen Reactions  . Darvon [Propoxyphene] Other (See Comments)    Gi problems  . Morphine And Related Itching  . Sulfa Antibiotics Hives  . Buprenorphine Hcl Itching  . Prochlorperazine Edisylate Itching and Other (See Comments)    "Acts weird: Hallucinations"    Family History  Problem Relation Age of Onset  . Breast cancer Sister   . Diabetes Sister   . Glaucoma Sister   . Cataracts Sister   . Hypertension Sister   . Anuerysm Mother   . Pancreatic cancer Father   . Retinal detachment Brother   . Cataracts Sister      Social History   Socioeconomic History  . Marital status: Single    Spouse name: Not on file  . Number of children: 2  . Years of education: Not on file  . Highest education level: Associate degree: academic program  Occupational History  . Occupation: disabled  Social Needs  . Financial resource strain: Not on file  . Food insecurity:    Worry: Not on file    Inability: Not on file  . Transportation needs:    Medical: Not on file  Non-medical: Not on file  Tobacco Use  . Smoking status: Never Smoker  . Smokeless tobacco: Never Used  Substance and Sexual Activity  . Alcohol use: No  . Drug use: No  . Sexual activity: Not Currently    Birth control/protection: Surgical  Lifestyle  . Physical activity:    Days per week: Not on file    Minutes per session: Not on file  . Stress: Not on file  Relationships  . Social connections:    Talks on phone: Not on file    Gets together: Not on file    Attends religious service: Not on file    Active member of club or organization: Not on file    Attends meetings of  clubs or organizations: Not on file    Relationship status: Not on file  . Intimate partner violence:    Fear of current or ex partner: Not on file    Emotionally abused: Not on file    Physically abused: Not on file    Forced sexual activity: Not on file  Other Topics Concern  . Not on file  Social History Narrative   No regular exercise      Pt is right handed, she occasionally drinks tea, walks QOD. She lives with her 51 yr old son, he has mild cerebral palsy.     PHYSICAL EXAM:  VS: BP 130/74   Pulse 68   Ht 5\' 5"  (1.651 m)   Wt 179 lb (81.2 kg)   SpO2 98%   BMI 29.79 kg/m  Physical Exam Gen: NAD, alert, cooperative with exam, well-appearing ENT: normal lips, normal nasal mucosa,  Eye: normal EOM, normal conjunctiva and lids CV:  no edema, +2 pedal pulses   Resp: no accessory muscle use, non-labored,  Skin: no rashes, no areas of induration  Neuro: normal tone, normal sensation to touch Psych:  normal insight, alert and oriented MSK:  Back/right leg: No tenderness to palpation over the SI joint, piriformis, or greater trochanter. No pain with internal rotation. No pain with external rotation. Normal strength resistance to hip flexion, knee flexion extension, plantarflexion and dorsiflexion. Negative straight leg raise. Neurovascular intact     ASSESSMENT & PLAN:   Lumbar radiculopathy Pain seems consistent with radiculopathy.  Has recently had a URI and was coughing which could have triggered the problem.  Is intolerant to NSAIDs and prednisone. -Gabapentin and tramadol for severe pain. -Counseled on home exercise therapy. -Counseled supportive care. -Temporary handicap application was filled out -If no improvement would consider physical therapy or trigger point injections.

## 2018-05-15 NOTE — Assessment & Plan Note (Signed)
Pain seems consistent with radiculopathy.  Has recently had a URI and was coughing which could have triggered the problem.  Is intolerant to NSAIDs and prednisone. -Gabapentin and tramadol for severe pain. -Counseled on home exercise therapy. -Counseled supportive care. -Temporary handicap application was filled out -If no improvement would consider physical therapy or trigger point injections.

## 2018-05-15 NOTE — Patient Instructions (Signed)
Nice to meet you  Please try the gabapentin. Please try one at night. Then you can increase to two times daily or three times daily  Please try the tramadol for severe pain  Please follow up with me in 2-3 weeks if the pain continues.

## 2018-05-29 ENCOUNTER — Other Ambulatory Visit: Payer: Self-pay | Admitting: Internal Medicine

## 2018-05-29 NOTE — Telephone Encounter (Signed)
Copied from CRM [id number]. Topic: Quick Communication - Rx Refill/Question >> May 29, 2018  5:23 PM [name], [name] wrote: **Patient's insurance would only cover a 5 day supply for the first time filling this medication. Patient requesting new prescription for the 7 day supply. States that she is still having some issues with it.**  Medication: traMADol (ULTRAM) 50 MG tablet   Has the patient contacted their pharmacy? Yes.   (Agent: If no, request that the patient contact the pharmacy for the refill.) (Agent: If yes, when and what did the pharmacy advise?)  Preferred Pharmacy (with phone number or street name): [facility] [id number] - [place], [place] - [address]  Agent: Please be advised that RX refills may take up to 3 business days. We ask that you follow-up with your pharmacy.

## 2018-05-31 ENCOUNTER — Encounter: Payer: Self-pay | Admitting: Internal Medicine

## 2018-05-31 NOTE — Telephone Encounter (Signed)
Pt calling to check on this request.  Pt was made aware of normal 1-3 business day turn around.

## 2018-06-01 NOTE — Telephone Encounter (Signed)
Pt calling to check status of RX - she said she is in a lot of pain. Please advise if Dr. Jordan Likes is going to refill for her. Call back # [phone]  [facility] [address] [place], [place] - [address]

## 2018-06-01 NOTE — Telephone Encounter (Signed)
Patient is calling to check on the status of her medication refill.  °

## 2018-06-04 NOTE — Telephone Encounter (Signed)
Left VM for patient. If she calls back please have her speak with a nurse/CMA and inform that we need to try a different method in her treatment instead of giving more tramadol. The [facility] can report results to patient.   If any questions then please take the best time and phone number to call and I will try to call her back.   Myra Rude, MD [facility] 06/04/2018, 9:12 AM

## 2018-06-12 ENCOUNTER — Ambulatory Visit: Payer: Self-pay | Admitting: *Deleted

## 2018-06-12 NOTE — Telephone Encounter (Signed)
Summary: Med question    Pt called in and would like to know if she could take promethazine-codeine (PHENERGAN WITH CODEINE) 6.25-10 MG/5ML syrup [[id number]] While taking the gabapentin (NEURONTIN) 100 MG capsule [[id number]] . She just stop taking the Gabapentin yesterday. She has a cough and would like to know if it would be ok for her to take the promethazine      Pt stated that she had taken the last Gabapentin yesterday and did not want to take it while taking the phenergan with codeine for her cough. She feels like the gabapentin did not help and was going to stop taking it. Cautioned her that this cough med would possibly make her drowsy even when taking it alone. Pt voiced understanding. Recommend that she calls back with increase in symptoms. Pt voiced understanding.  Reason for Disposition . Caller has medication question, adult has minor symptoms, caller declines triage, and triager answers question  Answer Assessment - Initial Assessment Questions 1. SYMPTOMS: "Do you have any symptoms?"     Cold symptoms 2. SEVERITY: If symptoms are present, ask "Are they mild, moderate or severe?" moderate  Protocols used: MEDICATION QUESTION CALL-A-AH

## 2018-06-19 ENCOUNTER — Ambulatory Visit: Payer: Medicare Other | Admitting: Family Medicine

## 2018-06-27 NOTE — Progress Notes (Deleted)
NEUROLOGY FOLLOW UP OFFICE NOTE  Kathleen Solis [id number]  HISTORY OF PRESENT ILLNESS: Kathleen Solis is a 52 year old right-handed female with hypertension, hyperlipidemia, carotid artery disease, and history of stroke who follows up for history of stroke.  UPDATE:  LDL from 12/26/2017 was 122.  She was advised to start atorvastatin 10 mg daily.  Repeat lipid panel on 02/21/2018 demonstrated LDL of 68.  Hemoglobin A1c at that time was 4.8.  She did not have a carotid Doppler performed as requested.  HISTORY:  In 2001, she had a stroke while pregnant with her son.  She was told it was due to elevated blood pressure.  She presented with slurred speech and right leg weakness.  As a result, her son was born with cerebral palsy.  She has been followed by multiple neurologists.  Since the stroke, she has been on aspirin 81 mg daily.  Noncontrast MRI brain report from 02/15/2014 demonstrated minimal scattered T2 hyperintensities in the left periventricular white matter regions suggesting chronic microvascular degenerative disease but no acute infarcts, mass lesions or demyelinating disease.  This was also noted on prior MRI from 2012.  She presented to [facility] urgent care on 11/24/2015 for acute onset right-sided headache and numbness with trouble walking.  CT of the head revealed no acute findings.  MRI of the brain was ordered but she left AGAINST MEDICAL ADVICE.  She followed up with neurologist, Dr. Stacy Gardner the following month.  MRI of the brain with and without contrast on 01/19/2016 again demonstrated patchy nonspecific cerebral white matter signal changes in the left frontal lobe and periatrial white matter, possibly indicative of remote ischemic stroke.  Abnormal flow in the left cervical ICA was noted as well.  Carotid Doppler from 03/16/2016 showed no hemodynamically significant stenosis.  A repeat MRI of the brain from 11/30/2016 performed to evaluate tinnitus in the right ear again  demonstrated chronic white matter changes, left greater than right, and watershed distribution, but no acute findings.  She was treated for episodic tension type headaches as well.  They are moderate in intensity non-throbbing headache on the top of her head.  They last 15 to 20 minutes and occur daily.  There are no associated symptoms such as nausea, photophobia, phonophobia, or unilateral numbness or weakness.  She was always hesitant about starting a preventative.  She treated them with ibuprofen until she stopped because it was found to be the cause of her tinnitus.  She has been on disability since 2007.  She continues to have right leg weakness and trouble with speech.  She has memory deficits.  She is overall independent.  On one occasion, she got lost while driving on familiar route.  She usually uses a GPS.  Since her stroke, she has insomnia.  She previously had a sleep study that demonstrated mild OSA not requiring CPAP.  She denies depression.  PAST MEDICAL HISTORY: Past Medical History:  Diagnosis Date  . Diverticulitis   . Hyperlipidemia    diet controlled - no med  . Hypertension   . Seasonal allergies   . Stroke [facility]) 2000   several mini strokes, weakness on right arm and leg  . SVD (spontaneous vaginal delivery)    x 2    MEDICATIONS: Current Outpatient Medications on File Prior to Visit  Medication Sig Dispense Refill  . aspirin EC 81 MG tablet Take 81 mg by mouth daily.    Marland Kitchen atorvastatin (LIPITOR) 10 MG tablet Take 1 tablet (10 mg total)  by mouth daily. 30 tablet 6  . cyclobenzaprine (FLEXERIL) 5 MG tablet Take 1 tablet (5 mg total) by mouth 3 (three) times daily as needed for muscle spasms. (Patient not taking: Reported on 05/15/2018) 30 tablet 0  . gabapentin (NEURONTIN) 100 MG capsule Take 1 capsule (100 mg total) by mouth 3 (three) times daily. 90 capsule 1  . hydrochlorothiazide (HYDRODIURIL) 25 MG tablet TAKE 25 MG BY MOUTH ONCE DAILY 90 tablet 1  . metoprolol  succinate (TOPROL-XL) 100 MG 24 hr tablet TAKE 100 MG BY MOUTH ONCE DAILY WITH OR IMMEDIATELY FOLLOWING A  MEAL 90 tablet 1  . promethazine-codeine (PHENERGAN WITH CODEINE) 6.25-10 MG/5ML syrup Take 5 mLs by mouth every 6 (six) hours as needed for cough. 118 mL 0  . traMADol (ULTRAM) 50 MG tablet Take 1 tablet (50 mg total) by mouth every 8 (eight) hours as needed. 20 tablet 0   No current facility-administered medications on file prior to visit.     ALLERGIES: Allergies  Allergen Reactions  . Darvon [Propoxyphene] Other (See Comments)    Gi problems  . Morphine And Related Itching  . Sulfa Antibiotics Hives  . Buprenorphine Hcl Itching  . Prochlorperazine Edisylate Itching and Other (See Comments)    "Acts weird: Hallucinations"    FAMILY HISTORY: Family History  Problem Relation Age of Onset  . Breast cancer Sister   . Diabetes Sister   . Glaucoma Sister   . Cataracts Sister   . Hypertension Sister   . Anuerysm Mother   . Pancreatic cancer Father   . Retinal detachment Brother   . Cataracts Sister    ***.  SOCIAL HISTORY: Social History   Socioeconomic History  . Marital status: Single    Spouse name: Not on file  . Number of children: 2  . Years of education: Not on file  . Highest education level: Associate degree: academic program  Occupational History  . Occupation: disabled  Social Needs  . Financial resource strain: Not on file  . Food insecurity:    Worry: Not on file    Inability: Not on file  . Transportation needs:    Medical: Not on file    Non-medical: Not on file  Tobacco Use  . Smoking status: Never Smoker  . Smokeless tobacco: Never Used  Substance and Sexual Activity  . Alcohol use: No  . Drug use: No  . Sexual activity: Not Currently    Birth control/protection: Surgical  Lifestyle  . Physical activity:    Days per week: Not on file    Minutes per session: Not on file  . Stress: Not on file  Relationships  . Social connections:     Talks on phone: Not on file    Gets together: Not on file    Attends religious service: Not on file    Active member of club or organization: Not on file    Attends meetings of clubs or organizations: Not on file    Relationship status: Not on file  . Intimate partner violence:    Fear of current or ex partner: Not on file    Emotionally abused: Not on file    Physically abused: Not on file    Forced sexual activity: Not on file  Other Topics Concern  . Not on file  Social History Narrative   No regular exercise      Pt is right handed, she occasionally drinks tea, walks QOD. She lives with her 51 yr old  son, he has mild cerebral palsy.    REVIEW OF SYSTEMS: Constitutional: No fevers, chills, or sweats, no generalized fatigue, change in appetite Eyes: No visual changes, double vision, eye pain Ear, nose and throat: No hearing loss, ear pain, nasal congestion, sore throat Cardiovascular: No chest pain, palpitations Respiratory:  No shortness of breath at rest or with exertion, wheezes GastrointestinaI: No nausea, vomiting, diarrhea, abdominal pain, fecal incontinence Genitourinary:  No dysuria, urinary retention or frequency Musculoskeletal:  No neck pain, back pain Integumentary: No rash, pruritus, skin lesions Neurological: as above Psychiatric: No depression, insomnia, anxiety Endocrine: No palpitations, fatigue, diaphoresis, mood swings, change in appetite, change in weight, increased thirst Hematologic/Lymphatic:  No purpura, petechiae. Allergic/Immunologic: no itchy/runny eyes, nasal congestion, recent allergic reactions, rashes  PHYSICAL EXAM: *** General: No acute distress.  Patient appears ***-groomed.  *** body habitus. Head:  Normocephalic/atraumatic Eyes:  Fundi examined but not visualized Neck: supple, no paraspinal tenderness, full range of motion Heart:  Regular rate and rhythm Lungs:  Clear to auscultation bilaterally Back: No paraspinal  tenderness Neurological Exam: Alert and oriented to person, place, and time.  Recent memory poor.  Remote memory intact.  Fund of knowledge intact.  Attention and concentration impaired.  Patient exhibits expressive aphasia.  No dysarthria.  Difficulty with naming and repeating.  Decreased right V1-V3 sensation.  Otherwise CN II to CN XII intact.  Bulk and tone normal.  4+/5 right lower extremity weakness, otherwise muscle strength 5/5 throughout.  Decreased pinprick sensation in the right upper and lower extremities.  Deep tendon reflexes 2+ throughout, toes downgoing.  Finger-to-nose testing without dysmetria.  Right hemiparetic gait.  Romberg negative.  IMPRESSION: 1.  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 2.  Vascular cognitive impairment secondary to stroke 3.  Tension type headaches, not intractable 4.  Insomnia  PLAN: 1.  Continue aspirin 81 mg daily for secondary stroke prevention.  Shon Millet, DO  CC: ***

## 2018-06-27 NOTE — Progress Notes (Signed)
Subjective:    Patient ID: Kathleen Solis, female    DOB: [birth year], 52 y.o.   MRN: [id number]  HPI She is here for an acute visit for cold symptoms.  Her symptoms started yesterday.   She is experiencing feeling cold, nasal congestion, sneezing, sore throat, cough, nausea, headaches and difficulty sleeping from the cough.  She denies fever, sinus pain, SOB, wheeze.  She has taken benadryl.    Medications and allergies reviewed with patient and updated if appropriate.  Patient Active Problem List   Diagnosis Date Noted  . Lumbar radiculopathy 05/15/2018  . Lumbar back pain 05/10/2018  . Body aches 05/08/2018  . Cough 05/08/2018  . B12 deficiency 02/23/2018  . Tingling 02/21/2018  . Onychomycosis 02/21/2018  . Left ovarian cyst 10/04/2017  . Nausea 06/07/2017  . Dark stools 06/07/2017  . Hyperlipidemia 02/14/2017  . Chest pain 02/14/2017  . Hyperglycemia 02/14/2017  . URI (upper respiratory infection) 11/09/2016  . Generalized abdominal pain 09/13/2016  . Constipation 09/13/2016  . Great toe pain, left 08/16/2016  . Allergic rhinitis 08/16/2016  . Carotid arterial disease (HCC) 04/10/2016  . Diverticulitis of colon without hemorrhage 02/25/2016  . Bunion of left foot 02/08/2016  . Bone spur of right foot 02/08/2016  . Leg neuralgia, right 02/08/2016  . Episodic tension type headache 01/12/2016  . Benign essential HTN 03/03/2015  . History of cerebrovascular accident with residual deficit 03/03/2015    Current Outpatient Medications on File Prior to Visit  Medication Sig Dispense Refill  . aspirin EC 81 MG tablet Take 81 mg by mouth daily.    Marland Kitchen atorvastatin (LIPITOR) 10 MG tablet Take 1 tablet (10 mg total) by mouth daily. 30 tablet 6  . hydrochlorothiazide (HYDRODIURIL) 25 MG tablet TAKE 25 MG BY MOUTH ONCE DAILY 90 tablet 1  . metoprolol succinate (TOPROL-XL) 100 MG 24 hr tablet TAKE 100 MG BY MOUTH ONCE DAILY WITH OR IMMEDIATELY FOLLOWING A  MEAL 90 tablet 1     No current facility-administered medications on file prior to visit.     Past Medical History:  Diagnosis Date  . Diverticulitis   . Hyperlipidemia    diet controlled - no med  . Hypertension   . Seasonal allergies   . Stroke [facility]) 2000   several mini strokes, weakness on right arm and leg  . SVD (spontaneous vaginal delivery)    x 2    Past Surgical History:  Procedure Laterality Date  . ABDOMINAL HYSTERECTOMY     partial  . ANAL FISSURE REPAIR    . APPENDECTOMY    . CHOLECYSTECTOMY    . CYSTOSCOPY N/A 10/04/2017   Procedure: CYSTOSCOPY;  Surgeon: Gerald Leitz, MD;  Location: [facility];  Service: Gynecology;  Laterality: N/A;  . FOOT SURGERY Left    big toe bunion removed  . LAPAROSCOPIC BILATERAL SALPINGO OOPHERECTOMY Bilateral 10/04/2017   Procedure: LAPAROSCOPIC BILATERAL SALPINGO OOPHORECTOMY, PELVIC WASHINGS;  Surgeon: Gerald Leitz, MD;  Location: [facility];  Service: Gynecology;  Laterality: Bilateral;  . TUBAL LIGATION    . WISDOM TOOTH EXTRACTION      Social History   Socioeconomic History  . Marital status: Single    Spouse name: Not on file  . Number of children: 2  . Years of education: Not on file  . Highest education level: Associate degree: academic program  Occupational History  . Occupation: disabled  Social Needs  . Financial resource strain: Not on file  . Food insecurity:  Worry: Not on file    Inability: Not on file  . Transportation needs:    Medical: Not on file    Non-medical: Not on file  Tobacco Use  . Smoking status: Never Smoker  . Smokeless tobacco: Never Used  Substance and Sexual Activity  . Alcohol use: No  . Drug use: No  . Sexual activity: Not Currently    Birth control/protection: Surgical  Lifestyle  . Physical activity:    Days per week: Not on file    Minutes per session: Not on file  . Stress: Not on file  Relationships  . Social connections:    Talks on phone: Not on file    Gets together: Not on file    Attends  religious service: Not on file    Active member of club or organization: Not on file    Attends meetings of clubs or organizations: Not on file    Relationship status: Not on file  Other Topics Concern  . Not on file  Social History Narrative   No regular exercise      Pt is right handed, she occasionally drinks tea, walks QOD. She lives with her 64 yr old son, he has mild cerebral palsy.    Family History  Problem Relation Age of Onset  . Breast cancer Sister   . Diabetes Sister   . Glaucoma Sister   . Cataracts Sister   . Hypertension Sister   . Anuerysm Mother   . Pancreatic cancer Father   . Retinal detachment Brother   . Cataracts Sister     Review of Systems  Constitutional: Negative for appetite change and fever.       Feels cold  HENT: Positive for congestion, sneezing and sore throat. Negative for ear pain and sinus pain.   Respiratory: Positive for cough. Negative for chest tightness, shortness of breath and wheezing.   Cardiovascular: Negative for chest pain.  Gastrointestinal: Positive for nausea. Negative for diarrhea.  Neurological: Positive for headaches. Negative for dizziness and light-headedness.  Psychiatric/Behavioral: Positive for sleep disturbance (last night from cold).       Objective:   Vitals:   06/28/18 0926  BP: 128/86  Pulse: 64  Resp: 16  Temp: 97.9 F (36.6 C)  SpO2: 98%   Filed Weights   06/28/18 0926  Weight: 178 lb 12.8 oz (81.1 kg)   Body mass index is 29.75 kg/m.  Wt Readings from Last 3 Encounters:  06/28/18 178 lb 12.8 oz (81.1 kg)  05/15/18 179 lb (81.2 kg)  05/10/18 179 lb 12.8 oz (81.6 kg)     Physical Exam GENERAL APPEARANCE: Appears stated age, well appearing, NAD EYES: conjunctiva clear, no icterus HEENT: bilateral tympanic membranes and ear canals normal, oropharynx with no erythema, no thyromegaly, trachea midline, no cervical or supraclavicular lymphadenopathy LUNGS: Clear to auscultation without wheeze or  crackles, unlabored breathing, good air entry bilaterally CARDIOVASCULAR: Normal S1,S2 without murmurs, no edema SKIN: warm, dry        Assessment & Plan:   See Problem List for Assessment and Plan of chronic medical problems.

## 2018-06-28 ENCOUNTER — Encounter: Payer: Self-pay | Admitting: Internal Medicine

## 2018-06-28 ENCOUNTER — Ambulatory Visit: Payer: Medicare Other | Admitting: Neurology

## 2018-06-28 ENCOUNTER — Ambulatory Visit: Payer: Medicare Other | Admitting: Internal Medicine

## 2018-06-28 VITALS — BP 128/86 | HR 64 | Temp 97.9°F | Resp 16 | Ht 65.0 in | Wt 178.8 lb

## 2018-06-28 DIAGNOSIS — J069 Acute upper respiratory infection, unspecified: Secondary | ICD-10-CM | POA: Diagnosis not present

## 2018-06-28 MED ORDER — PROMETHAZINE-CODEINE 6.25-10 MG/5ML PO SYRP: 5.0000 mL | ORAL_SOLUTION | Freq: Four times a day (QID) | ORAL | 0 refills | Status: DC | PRN

## 2018-06-28 NOTE — Patient Instructions (Addendum)
Take the cough syrup as prescribed - complete the entire course.  Take coricidin cold products or mucinex.    Continue benadryl, advil and tylenol.  Increase your fluids and rest.    Call if no improvement       Upper Respiratory Infection, Adult Most upper respiratory infections (URIs) are a viral infection of the air passages leading to the lungs. A URI affects the nose, throat, and upper air passages. The most common type of URI is nasopharyngitis and is typically referred to as "the common cold." URIs run their course and usually go away on their own. Most of the time, a URI does not require medical attention, but sometimes a bacterial infection in the upper airways can follow a viral infection. This is called a secondary infection. Sinus and middle ear infections are common types of secondary upper respiratory infections. Bacterial pneumonia can also complicate a URI. A URI can worsen asthma and chronic obstructive pulmonary disease (COPD). Sometimes, these complications can require emergency medical care and may be life threatening. What are the causes? Almost all URIs are caused by viruses. A virus is a type of germ and can spread from one person to another. What increases the risk? You may be at risk for a URI if:  You smoke.  You have chronic heart or lung disease.  You have a weakened defense (immune) system.  You are very young or very old.  You have nasal allergies or asthma.  You work in crowded or poorly ventilated areas.  You work in health care facilities or schools.  What are the signs or symptoms? Symptoms typically develop 2-3 days after you come in contact with a cold virus. Most viral URIs last 7-10 days. However, viral URIs from the influenza virus (flu virus) can last 14-18 days and are typically more severe. Symptoms may include:  Runny or stuffy (congested) nose.  Sneezing.  Cough.  Sore throat.  Headache.  Fatigue.  Fever.  Loss of  appetite.  Pain in your forehead, behind your eyes, and over your cheekbones (sinus pain).  Muscle aches.  How is this diagnosed? Your health care provider may diagnose a URI by:  Physical exam.  Tests to check that your symptoms are not due to another condition such as: ? Strep throat. ? Sinusitis. ? Pneumonia. ? Asthma.  How is this treated? A URI goes away on its own with time. It cannot be cured with medicines, but medicines may be prescribed or recommended to relieve symptoms. Medicines may help:  Reduce your fever.  Reduce your cough.  Relieve nasal congestion.  Follow these instructions at home:  Take medicines only as directed by your health care provider.  Gargle warm saltwater or take cough drops to comfort your throat as directed by your health care provider.  Use a warm mist humidifier or inhale steam from a shower to increase air moisture. This may make it easier to breathe.  Drink enough fluid to keep your urine clear or pale yellow.  Eat soups and other clear broths and maintain good nutrition.  Rest as needed.  Return to work when your temperature has returned to normal or as your health care provider advises. You may need to stay home longer to avoid infecting others. You can also use a face mask and careful hand washing to prevent spread of the virus.  Increase the usage of your inhaler if you have asthma.  Do not use any tobacco products, including cigarettes, chewing tobacco, or  electronic cigarettes. If you need help quitting, ask your health care provider. How is this prevented? The best way to protect yourself from getting a cold is to practice good hygiene.  Avoid oral or hand contact with people with cold symptoms.  Wash your hands often if contact occurs.  There is no clear evidence that vitamin C, vitamin E, echinacea, or exercise reduces the chance of developing a cold. However, it is always recommended to get plenty of rest, exercise,  and practice good nutrition. Contact a health care provider if:  You are getting worse rather than better.  Your symptoms are not controlled by medicine.  You have chills.  You have worsening shortness of breath.  You have brown or red mucus.  You have yellow or brown nasal discharge.  You have pain in your face, especially when you bend forward.  You have a fever.  You have swollen neck glands.  You have pain while swallowing.  You have white areas in the back of your throat. Get help right away if:  You have severe or persistent: ? Headache. ? Ear pain. ? Sinus pain. ? Chest pain.  You have chronic lung disease and any of the following: ? Wheezing. ? Prolonged cough. ? Coughing up blood. ? A change in your usual mucus.  You have a stiff neck.  You have changes in your: ? Vision. ? Hearing. ? Thinking. ? Mood. This information is not intended to replace advice given to you by your health care provider. Make sure you discuss any questions you have with your health care provider. Document Released: 02/01/2001 Document Revised: 04/10/2016 Document Reviewed: 11/13/2013 Elsevier Interactive Patient Education  [employer].

## 2018-06-28 NOTE — Assessment & Plan Note (Signed)
Symptoms likely viral in nature Cough syrup prescribed Continue symptomatic treatment with over-the-counter cold medications, Tylenol/ibuprofen, benadryl Try coricidin or mucinex Increase rest and fluids Call if symptoms worsen or do not improve

## 2018-07-04 ENCOUNTER — Telehealth: Payer: Self-pay | Admitting: Internal Medicine

## 2018-07-04 MED ORDER — PROMETHAZINE-CODEINE 6.25-10 MG/5ML PO SYRP: 5.0000 mL | ORAL_SOLUTION | Freq: Four times a day (QID) | ORAL | 0 refills | Status: DC | PRN

## 2018-07-04 NOTE — Telephone Encounter (Signed)
LVM letting pt know that rx was sent.

## 2018-07-04 NOTE — Telephone Encounter (Signed)
rx sent in.  Please call her.

## 2018-07-04 NOTE — Telephone Encounter (Signed)
Copied from CRM [id number]. Topic: Quick Communication - Rx Refill/Question >> Jul 04, 2018 10:10 AM Crist InfanteHarrald, Kathy J wrote: Medication: promethazine-codeine (PHENERGAN WITH CODEINE) 6.25-10 MG/5ML syrup  Pt seen 11/07 and still has a cough, which keeps her up at night.  Pt requesting a refill and a call back. [facility] [address] [place]- , [place] - [id number] [phone] (Phone) [phone] (Fax)

## 2018-07-10 ENCOUNTER — Ambulatory Visit: Payer: Medicare Other | Admitting: Internal Medicine

## 2018-07-10 ENCOUNTER — Emergency Department (HOSPITAL_COMMUNITY): Payer: Medicare Other

## 2018-07-10 ENCOUNTER — Other Ambulatory Visit: Payer: Self-pay

## 2018-07-10 ENCOUNTER — Emergency Department (HOSPITAL_COMMUNITY)
Admission: EM | Admit: 2018-07-10 | Discharge: 2018-07-10 | Discharge status: Against Medical Advice | Payer: Medicare Other | Attending: Emergency Medicine | Admitting: Emergency Medicine

## 2018-07-10 ENCOUNTER — Encounter (HOSPITAL_COMMUNITY): Payer: Self-pay

## 2018-07-10 ENCOUNTER — Ambulatory Visit: Payer: Self-pay

## 2018-07-10 DIAGNOSIS — R079 Chest pain, unspecified: Secondary | ICD-10-CM | POA: Diagnosis present

## 2018-07-10 DIAGNOSIS — I10 Essential (primary) hypertension: Secondary | ICD-10-CM | POA: Insufficient documentation

## 2018-07-10 DIAGNOSIS — R072 Precordial pain: Secondary | ICD-10-CM

## 2018-07-10 DIAGNOSIS — Z79899 Other long term (current) drug therapy: Secondary | ICD-10-CM | POA: Diagnosis not present

## 2018-07-10 HISTORY — DX: Sciatica, unspecified side: M54.30

## 2018-07-10 LAB — BASIC METABOLIC PANEL
Anion gap: 9 (ref 5–15)
BUN: 11 mg/dL (ref 6–20)
CO2: 35 mmol/L — ABNORMAL HIGH (ref 22–32)
Calcium: 9.7 mg/dL (ref 8.9–10.3)
Chloride: 97 mmol/L — ABNORMAL LOW (ref 98–111)
Creatinine, Ser: 0.74 mg/dL (ref 0.44–1.00)
GFR calc Af Amer: >60 mL/min (ref >60)
GFR calc non Af Amer: >60 mL/min (ref >60)
Glucose, Bld: 102 mg/dL — ABNORMAL HIGH (ref 70–99)
Potassium: 3 mmol/L — ABNORMAL LOW (ref 3.5–5.1)
Sodium: 141 mmol/L (ref 135–145)

## 2018-07-10 LAB — CBC
HCT: 42 % (ref 36.0–46.0)
Hemoglobin: 14.7 g/dL (ref 12.0–15.0)
MCH: 32 pg (ref 26.0–34.0)
MCHC: 35 g/dL (ref 30.0–36.0)
MCV: 91.5 fL (ref 80.0–100.0)
Platelets: 250 10*3/uL (ref 150–400)
RBC: 4.59 MIL/uL (ref 3.87–5.11)
RDW: 14.2 % (ref 11.5–15.5)
WBC: 7.6 10*3/uL (ref 4.0–10.5)
nRBC: 0 % (ref 0.0–0.2)

## 2018-07-10 LAB — I-STAT TROPONIN, ED
Troponin i, poc: 0 ng/mL (ref 0.00–0.08)
Troponin i, poc: 0 ng/mL (ref 0.00–0.08)

## 2018-07-10 MED ORDER — ALUM & MAG HYDROXIDE-SIMETH 200-200-20 MG/5ML PO SUSP
15.0000 mL | Freq: Once | ORAL | Status: AC
  Administered 2018-07-10: 15 mL via ORAL
  Filled 2018-07-10: qty 30

## 2018-07-10 MED ORDER — ALBUTEROL SULFATE (2.5 MG/3ML) 0.083% IN NEBU: 5.0000 mg | INHALATION_SOLUTION | Freq: Once | RESPIRATORY_TRACT | Status: DC

## 2018-07-10 MED ORDER — IPRATROPIUM BROMIDE 0.02 % IN SOLN: 0.5000 mg | Freq: Once | RESPIRATORY_TRACT | Status: DC

## 2018-07-10 MED ORDER — ACETAMINOPHEN 500 MG PO TABS
1000.0000 mg | ORAL_TABLET | Freq: Once | ORAL | Status: AC
  Administered 2018-07-10: 1000 mg via ORAL
  Filled 2018-07-10: qty 2

## 2018-07-10 MED ORDER — FAMOTIDINE 20 MG PO TABS
20.0000 mg | ORAL_TABLET | Freq: Once | ORAL | Status: AC
  Administered 2018-07-10: 20 mg via ORAL
  Filled 2018-07-10: qty 1

## 2018-07-10 NOTE — ED Triage Notes (Signed)
Patient reports that she is currently receiving treatment for URI. Patient states when she got into the shower today she began having left chest pain and feeling like she was "going to pass out." Patient reports the chest pain is now on the right and radiates to the back. Patient denies any SOB.

## 2018-07-10 NOTE — ED Provider Notes (Signed)
[facility]-EMERGENCY DEPT Provider Note   CSN: [id number] Arrival date & time: 07/10/18  0904     History   Chief Complaint Chief Complaint  Patient presents with  . Chest Pain  . Near Syncope    HPI Jrue Hinz is a 52 y.o. female.  Patient c/o mid chest pain while in shower this AM, felt lightheaded. Pain constant, dull, moderate, non radiating, not pleuritic. Patient indicated vomited x 1, non bloody or bilious. Denies abd pain or distension. No associated diaphoresis or sob. No palpitations. Denies hx cad or fam hx cad. No leg pain or swelling. No hx dvt or pe. Pt also notes non prod cough. No sore throat or runny nose. Denies heartburn.  Denies other recent chest pain or discomfort, no recent unusual doe or fatigue.   The history is provided by the patient.  Chest Pain   Associated symptoms include cough, near-syncope and vomiting. Pertinent negatives include no abdominal pain, no back pain, no fever, no headaches, no palpitations and no shortness of breath.  Near Syncope  Associated symptoms include chest pain. Pertinent negatives include no abdominal pain, no headaches and no shortness of breath.    Past Medical History:  Diagnosis Date  . Diverticulitis   . Hyperlipidemia    diet controlled - no med  . Hypertension   . Sciatica    right  . Seasonal allergies   . Stroke [facility]) 2000   several mini strokes, weakness on right arm and leg  . SVD (spontaneous vaginal delivery)    x 2    Patient Active Problem List   Diagnosis Date Noted  . Lumbar radiculopathy 05/15/2018  . Lumbar back pain 05/10/2018  . Body aches 05/08/2018  . Cough 05/08/2018  . B12 deficiency 02/23/2018  . Tingling 02/21/2018  . Onychomycosis 02/21/2018  . Left ovarian cyst 10/04/2017  . Nausea 06/07/2017  . Dark stools 06/07/2017  . Hyperlipidemia 02/14/2017  . Chest pain 02/14/2017  . Hyperglycemia 02/14/2017  . Tinnitus of right ear 11/16/2016  . URI (upper  respiratory infection) 11/09/2016  . Generalized abdominal pain 09/13/2016  . Constipation 09/13/2016  . Great toe pain, left 08/16/2016  . Allergic rhinitis 08/16/2016  . Carotid arterial disease (HCC) 04/10/2016  . Diverticulitis of colon without hemorrhage 02/25/2016  . Bunion of left foot 02/08/2016  . Bone spur of right foot 02/08/2016  . Leg neuralgia, right 02/08/2016  . Episodic tension type headache 01/12/2016  . Benign essential HTN 03/03/2015  . History of cerebrovascular accident with residual deficit 03/03/2015    Past Surgical History:  Procedure Laterality Date  . ABDOMINAL HYSTERECTOMY     partial  . ANAL FISSURE REPAIR    . APPENDECTOMY    . CHOLECYSTECTOMY    . CYSTOSCOPY N/A 10/04/2017   Procedure: CYSTOSCOPY;  Surgeon: Gerald Leitz, MD;  Location: [facility];  Service: Gynecology;  Laterality: N/A;  . FOOT SURGERY Left    big toe bunion removed  . LAPAROSCOPIC BILATERAL SALPINGO OOPHERECTOMY Bilateral 10/04/2017   Procedure: LAPAROSCOPIC BILATERAL SALPINGO OOPHORECTOMY, PELVIC WASHINGS;  Surgeon: Gerald Leitz, MD;  Location: [facility];  Service: Gynecology;  Laterality: Bilateral;  . TUBAL LIGATION    . WISDOM TOOTH EXTRACTION       OB History    Gravida  4   Para  2   Term  2   Preterm      AB  2   Living  2     SAB  2  TAB      Ectopic      Multiple      Live Births  2            Home Medications    Prior to Admission medications   Medication Sig Start Date End Date Taking? Authorizing Provider  aspirin EC 81 MG tablet Take 81 mg by mouth daily.    [provider]  atorvastatin (LIPITOR) 10 MG tablet Take 1 tablet (10 mg total) by mouth daily. 12/28/17   Everlena Cooper, Adam R, DO  hydrochlorothiazide (HYDRODIURIL) 25 MG tablet TAKE 25 MG BY MOUTH ONCE DAILY 02/21/18   Burns, Bobette Mo, MD  metoprolol succinate (TOPROL-XL) 100 MG 24 hr tablet TAKE 100 MG BY MOUTH ONCE DAILY WITH OR IMMEDIATELY FOLLOWING A  MEAL 02/21/18   Burns, Bobette Mo, MD    promethazine-codeine (PHENERGAN WITH CODEINE) 6.25-10 MG/5ML syrup Take 5 mLs by mouth every 6 (six) hours as needed for cough. 07/04/18   Pincus Sanes, MD    Family History Family History  Problem Relation Age of Onset  . Breast cancer Sister   . Diabetes Sister   . Glaucoma Sister   . Cataracts Sister   . Hypertension Sister   . Anuerysm Mother   . Pancreatic cancer Father   . Retinal detachment Brother   . Cataracts Sister     Social History Social History   Tobacco Use  . Smoking status: Never Smoker  . Smokeless tobacco: Never Used  Substance Use Topics  . Alcohol use: No  . Drug use: No     Allergies   Darvon [propoxyphene]; Morphine and related; Sulfa antibiotics; Buprenorphine hcl; and Prochlorperazine edisylate   Review of Systems Review of Systems  Constitutional: Negative for fever.  HENT: Negative for sore throat.   Eyes: Negative for redness.  Respiratory: Positive for cough. Negative for shortness of breath.   Cardiovascular: Positive for chest pain and near-syncope. Negative for palpitations and leg swelling.  Gastrointestinal: Positive for vomiting. Negative for abdominal pain.  Genitourinary: Negative for flank pain.  Musculoskeletal: Negative for back pain and neck pain.  Skin: Negative for rash.  Neurological: Negative for headaches.  Hematological: Does not bruise/bleed easily.  Psychiatric/Behavioral: Negative for confusion.     Physical Exam Updated Vital Signs BP (!) 128/91 (BP Location: Left Arm)   Pulse 68   Temp 98.1 F (36.7 C) (Oral)   Resp 14   Ht 1.651 m (5\' 5" )   Wt 79.8 kg   SpO2 96%   BMI 29.29 kg/m   Physical Exam  Constitutional: She appears well-developed and well-nourished.  HENT:  Mouth/Throat: Oropharynx is clear and moist.  Eyes: Conjunctivae are normal. No scleral icterus.  Neck: Neck supple. No tracheal deviation present.  Cardiovascular: Normal rate, regular rhythm, normal heart sounds and intact  distal pulses. Exam reveals no gallop and no friction rub.  No murmur heard. Pulmonary/Chest: Effort normal and breath sounds normal. No respiratory distress. She exhibits no tenderness.  Abdominal: Soft. Normal appearance and bowel sounds are normal. She exhibits no distension and no mass. There is no tenderness. There is no guarding.  Musculoskeletal: She exhibits no edema or tenderness.  Neurological: She is alert.  Skin: Skin is warm and dry. No rash noted.  Psychiatric: She has a normal mood and affect.  Nursing note and vitals reviewed.    ED Treatments / Results  Labs (all labs ordered are listed, but only abnormal results are displayed) Results for orders  placed or performed during the hospital encounter of 07/10/18  Basic metabolic panel  Result Value Ref Range   Sodium 141 135 - 145 mmol/L   Potassium 3.0 (L) 3.5 - 5.1 mmol/L   Chloride 97 (L) 98 - 111 mmol/L   CO2 35 (H) 22 - 32 mmol/L   Glucose, Bld 102 (H) 70 - 99 mg/dL   BUN 11 6 - 20 mg/dL   Creatinine, Ser 1.610.74 0.44 - 1.00 mg/dL   Calcium 9.7 8.9 - 09.610.3 mg/dL   GFR calc non Af Amer >60 >60 mL/min   GFR calc Af Amer >60 >60 mL/min   Anion gap 9 5 - 15  CBC  Result Value Ref Range   WBC 7.6 4.0 - 10.5 K/uL   RBC 4.59 3.87 - 5.11 MIL/uL   Hemoglobin 14.7 12.0 - 15.0 g/dL   HCT 04.542.0 40.936.0 - 81.146.0 %   MCV 91.5 80.0 - 100.0 fL   MCH 32.0 26.0 - 34.0 pg   MCHC 35.0 30.0 - 36.0 g/dL   RDW 91.414.2 78.211.5 - 95.615.5 %   Platelets 250 150 - 400 K/uL   nRBC 0.0 0.0 - 0.2 %  I-stat troponin, ED  Result Value Ref Range   Troponin i, poc 0.00 0.00 - 0.08 ng/mL   Comment 3          I-stat troponin, ED  Result Value Ref Range   Troponin i, poc 0.00 0.00 - 0.08 ng/mL   Comment 3           Dg Chest 2 View  Result Date: 07/10/2018 CLINICAL DATA:  Chest pain. EXAM: CHEST - 2 VIEW COMPARISON:  Radiographs of May 08, 2018. FINDINGS: The heart size and mediastinal contours are within normal limits. Both lungs are clear. No  pneumothorax or pleural effusion is noted. The visualized skeletal structures are unremarkable. IMPRESSION: No active cardiopulmonary disease. Electronically Signed   By: Lupita RaiderJames  Green Jr, M.D.   On: 07/10/2018 09:35    EKG EKG Interpretation  Date/Time:  Tuesday July 10 2018 09:11:11 EST Ventricular Rate:  70 PR Interval:    QRS Duration: 95 QT Interval:  392 QTC Calculation: 423 R Axis:   68 Text Interpretation:  Sinus rhythm Non-specific ST-t changes Baseline wander in lead(s) V6 No significant change since last tracing Confirmed by Cathren LaineSteinl, Arel Tippen ([id number]) on 07/10/2018 9:27:39 AM   Radiology Dg Chest 2 View  Result Date: 07/10/2018 CLINICAL DATA:  Chest pain. EXAM: CHEST - 2 VIEW COMPARISON:  Radiographs of May 08, 2018. FINDINGS: The heart size and mediastinal contours are within normal limits. Both lungs are clear. No pneumothorax or pleural effusion is noted. The visualized skeletal structures are unremarkable. IMPRESSION: No active cardiopulmonary disease. Electronically Signed   By: Lupita RaiderJames  Green Jr, M.D.   On: 07/10/2018 09:35    Procedures Procedures (including critical care time)  Medications Ordered in ED Medications  famotidine (PEPCID) tablet 20 mg (has no administration in time range)  alum & mag hydroxide-simeth (MAALOX/MYLANTA) 200-200-20 MG/5ML suspension 15 mL (has no administration in time range)  acetaminophen (TYLENOL) tablet 1,000 mg (has no administration in time range)     Initial Impression / Assessment and Plan / ED Course  I have reviewed the triage vital signs and the nursing notes.  Pertinent labs & imaging results that were available during my care of the patient were reviewed by me and considered in my medical decision making (see chart for details).  Labs. Ecg. Cxr.  ECG appears similar to prior.  Labs pending.   Will try meds for symptom relief - pepcid, maalox, acetaminophen.   cxr reviewed - no pna.   Labs reviewed - initial  trop normal. Will get delta trop.   I went to recheck/reassess patient - pt had left ED AMA prior to completion evaluation, without myself being notified prior to her leaving department.     Final Clinical Impressions(s) / ED Diagnoses   Final diagnoses:  None    ED Discharge Orders    None       Cathren Laine, MD 07/10/18 1417

## 2018-07-10 NOTE — Telephone Encounter (Signed)
Pt. Reports she had severe chest pain at 0530 while she was in the shower, lasted 5-10 minutes. Vomited 2 after and was sweating. Pain went away. Dropping her son at school and "now it feels like it could come back." Instructed to go to ED now for evaluation. [facility] - will go now.  Reason for Disposition . Patient sounds very sick or weak to the triager  Answer Assessment - Initial Assessment Questions 1. LOCATION: "Where does it hurt?"        Hurts in center 2. RADIATION: "Does the pain go anywhere else?" (e.g., into neck, jaw, arms, back)     No 3. ONSET: "When did the chest pain begin?" (Minutes, hours or days)      0530 4. PATTERN "Does the pain come and go, or has it been constant since it started?"  "Does it get worse with exertion?"      Stopped and now trying to start again 5. DURATION: "How long does it last" (e.g., seconds, minutes, hours)     Lasted 5-10 minutes 6. SEVERITY: "How bad is the pain?"  (e.g., Scale 1-10; mild, moderate, or severe)    - MILD (1-3): doesn't interfere with normal activities     - MODERATE (4-7): interferes with normal activities or awakens from sleep    - SEVERE (8-10): excruciating pain, unable to do any normal activities       Severe in the shower 7. CARDIAC RISK FACTORS: "Do you have any history of heart problems or risk factors for heart disease?" (e.g., prior heart attack, angina; high blood pressure, diabetes, being overweight, high cholesterol, smoking, or strong family history of heart disease)     HTN, H/O CVA 8. PULMONARY RISK FACTORS: "Do you have any history of lung disease?"  (e.g., blood clots in lung, asthma, emphysema, birth control pills)      No 9. CAUSE: "What do you think is causing the chest pain?"     No 10. OTHER SYMPTOMS: "Do you have any other symptoms?" (e.g., dizziness, nausea, vomiting, sweating, fever, difficulty breathing, cough)       Vomited x 2 , had sweating 11. PREGNANCY: "Is there any chance you are  pregnant?" "When was your last menstrual period?"        No  Protocols used: CHEST PAIN-A-AH

## 2018-07-17 ENCOUNTER — Telehealth: Payer: Self-pay | Admitting: Internal Medicine

## 2018-07-17 NOTE — Telephone Encounter (Signed)
Patient needs an appointment

## 2018-07-17 NOTE — Progress Notes (Signed)
Subjective:    Patient ID: Kathleen Solis, female    DOB: 1966/03/10, 52 y.o.   MRN: [id number]  HPI She is here for an acute visit for cold symptoms.  She was here 11/7 for cold symptoms and I diagnosed her with a viral cold.    Her symptoms have been persistent since she was here last.    She is experiencing nasal congestion, persistent cough for over 2 weeks that is sometimes productive.  She is having some back pain from all the coughing.  She denies any fevers, chills, shortness of breath, wheezing, sinus pain, sore throat, ear pain and headaches.  She has taken cough syrup I prescribed, which did not help much.   Medications and allergies reviewed with patient and updated if appropriate.  Patient Active Problem List   Diagnosis Date Noted  . Lumbar radiculopathy 05/15/2018  . Lumbar back pain 05/10/2018  . Body aches 05/08/2018  . Cough 05/08/2018  . B12 deficiency 02/23/2018  . Tingling 02/21/2018  . Onychomycosis 02/21/2018  . Left ovarian cyst 10/04/2017  . Nausea 06/07/2017  . Dark stools 06/07/2017  . Hyperlipidemia 02/14/2017  . Chest pain 02/14/2017  . Hyperglycemia 02/14/2017  . Tinnitus of right ear 11/16/2016  . URI (upper respiratory infection) 11/09/2016  . Generalized abdominal pain 09/13/2016  . Constipation 09/13/2016  . Great toe pain, left 08/16/2016  . Allergic rhinitis 08/16/2016  . Carotid arterial disease (HCC) 04/10/2016  . Diverticulitis of colon without hemorrhage 02/25/2016  . Bunion of left foot 02/08/2016  . Bone spur of right foot 02/08/2016  . Leg neuralgia, right 02/08/2016  . Episodic tension type headache 01/12/2016  . Benign essential HTN 03/03/2015  . History of cerebrovascular accident with residual deficit 03/03/2015    Current Outpatient Medications on File Prior to Visit  Medication Sig Dispense Refill  . aspirin EC 81 MG tablet Take 81 mg by mouth at bedtime.     Marland Kitchen atorvastatin (LIPITOR) 10 MG tablet Take 1 tablet (10  mg total) by mouth daily. (Patient taking differently: Take 10 mg by mouth at bedtime. ) 30 tablet 6  . hydrochlorothiazide (HYDRODIURIL) 25 MG tablet TAKE 25 MG BY MOUTH ONCE DAILY (Patient taking differently: Take 25 mg by mouth at bedtime. TAKE 25 MG BY MOUTH ONCE DAILY) 90 tablet 1  . metoprolol succinate (TOPROL-XL) 100 MG 24 hr tablet TAKE 100 MG BY MOUTH ONCE DAILY WITH OR IMMEDIATELY FOLLOWING A  MEAL (Patient taking differently: Take 100 mg by mouth at bedtime. ) 90 tablet 1   No current facility-administered medications on file prior to visit.     Past Medical History:  Diagnosis Date  . Diverticulitis   . Hyperlipidemia    diet controlled - no med  . Hypertension   . Sciatica    right  . Seasonal allergies   . Stroke [facility]) 2000   several mini strokes, weakness on right arm and leg  . SVD (spontaneous vaginal delivery)    x 2    Past Surgical History:  Procedure Laterality Date  . ABDOMINAL HYSTERECTOMY     partial  . ANAL FISSURE REPAIR    . APPENDECTOMY    . CHOLECYSTECTOMY    . CYSTOSCOPY N/A 10/04/2017   Procedure: CYSTOSCOPY;  Surgeon: Gerald Leitz, MD;  Location: [facility];  Service: Gynecology;  Laterality: N/A;  . FOOT SURGERY Left    big toe bunion removed  . LAPAROSCOPIC BILATERAL SALPINGO OOPHERECTOMY Bilateral 10/04/2017   Procedure: LAPAROSCOPIC  BILATERAL SALPINGO OOPHORECTOMY, PELVIC WASHINGS;  Surgeon: Gerald Leitz, MD;  Location: [facility];  Service: Gynecology;  Laterality: Bilateral;  . TUBAL LIGATION    . WISDOM TOOTH EXTRACTION      Social History   Socioeconomic History  . Marital status: Single    Spouse name: Not on file  . Number of children: 2  . Years of education: Not on file  . Highest education level: Associate degree: academic program  Occupational History  . Occupation: disabled  Social Needs  . Financial resource strain: Not on file  . Food insecurity:    Worry: Not on file    Inability: Not on file  . Transportation needs:     Medical: Not on file    Non-medical: Not on file  Tobacco Use  . Smoking status: Never Smoker  . Smokeless tobacco: Never Used  Substance and Sexual Activity  . Alcohol use: No  . Drug use: No  . Sexual activity: Not Currently    Birth control/protection: Surgical  Lifestyle  . Physical activity:    Days per week: Not on file    Minutes per session: Not on file  . Stress: Not on file  Relationships  . Social connections:    Talks on phone: Not on file    Gets together: Not on file    Attends religious service: Not on file    Active member of club or organization: Not on file    Attends meetings of clubs or organizations: Not on file    Relationship status: Not on file  Other Topics Concern  . Not on file  Social History Narrative   No regular exercise      Pt is right handed, she occasionally drinks tea, walks QOD. She lives with her 75 yr old son, he has mild cerebral palsy.    Family History  Problem Relation Age of Onset  . Breast cancer Sister   . Diabetes Sister   . Glaucoma Sister   . Cataracts Sister   . Hypertension Sister   . Anuerysm Mother   . Pancreatic cancer Father   . Retinal detachment Brother   . Cataracts Sister     Review of Systems  Constitutional: Negative for chills and fever.  HENT: Positive for congestion. Negative for ear pain, sinus pain and sore throat.   Respiratory: Positive for cough (sometimes productive). Negative for shortness of breath and wheezing.   Gastrointestinal: Negative for diarrhea and nausea.  Musculoskeletal: Positive for back pain (from coughing). Negative for myalgias.  Neurological: Negative for light-headedness and headaches.       Objective:   Vitals:   07/18/18 1020  BP: 118/78  Pulse: 76  Resp: 16  Temp: 98.1 F (36.7 C)  SpO2: 97%   Filed Weights   07/18/18 1020  Weight: 180 lb 12.8 oz (82 kg)   Body mass index is 30.09 kg/m.  Wt Readings from Last 3 Encounters:  07/18/18 180 lb 12.8 oz (82  kg)  07/10/18 176 lb (79.8 kg)  06/28/18 178 lb 12.8 oz (81.1 kg)     Physical Exam GENERAL APPEARANCE: Appears stated age, well appearing, NAD EYES: conjunctiva clear, no icterus HEENT: bilateral tympanic membranes and ear canals normal, oropharynx with no erythema, no thyromegaly, trachea midline, no cervical or supraclavicular lymphadenopathy LUNGS: Significant deep cough throughout exam.  Maybe slightly coarse breath sounds, but no wheezing or crackles, unlabored breathing, good air entry bilaterally CARDIOVASCULAR: Normal S1,S2 without murmurs, no edema SKIN: warm,  dry        Assessment & Plan:   See Problem List for Assessment and Plan of chronic medical problems.

## 2018-07-17 NOTE — Telephone Encounter (Signed)
Copied from CRM [id number]. Topic: General - Other >> Jul 17, 2018  9:01 AM Herby AbrahamJohnson, Shiquita C wrote: Reason for CRM: pt is calling in to be advised. Pt was seen on 06/28/18 for a cough. Pt says that she was prescribed a medication but it isn't helping. Pt would like to be advise further.   [facility] [address] - , [place] - [id number] [phone] (Phone) [phone] (Fax)  [phone] - pt

## 2018-07-17 NOTE — Telephone Encounter (Signed)
Left message informing patient.

## 2018-07-17 NOTE — Telephone Encounter (Signed)
Pt called back and is scheduled for 11/27.

## 2018-07-18 ENCOUNTER — Encounter: Payer: Self-pay | Admitting: Internal Medicine

## 2018-07-18 ENCOUNTER — Ambulatory Visit: Payer: Medicare Other | Admitting: Internal Medicine

## 2018-07-18 ENCOUNTER — Ambulatory Visit: Payer: Self-pay | Admitting: *Deleted

## 2018-07-18 ENCOUNTER — Telehealth: Payer: Self-pay | Admitting: Internal Medicine

## 2018-07-18 VITALS — BP 118/78 | HR 76 | Temp 98.1°F | Resp 16 | Ht 65.0 in | Wt 180.8 lb

## 2018-07-18 DIAGNOSIS — J22 Unspecified acute lower respiratory infection: Secondary | ICD-10-CM | POA: Diagnosis not present

## 2018-07-18 HISTORY — DX: Unspecified acute lower respiratory infection: J22

## 2018-07-18 MED ORDER — PROMETHAZINE-CODEINE 6.25-10 MG/5ML PO SYRP: 5.0000 mL | ORAL_SOLUTION | Freq: Four times a day (QID) | ORAL | 0 refills | Status: DC | PRN

## 2018-07-18 MED ORDER — METHYLPREDNISOLONE 4 MG PO TBPK: ORAL_TABLET | ORAL | 0 refills | Status: DC

## 2018-07-18 MED ORDER — AZITHROMYCIN 250 MG PO TABS: ORAL_TABLET | ORAL | 0 refills | Status: DC

## 2018-07-18 MED ORDER — HYDROCOD POLST-CPM POLST ER 10-8 MG/5ML PO SUER: 5.0000 mL | Freq: Two times a day (BID) | ORAL | 0 refills | Status: DC | PRN

## 2018-07-18 NOTE — Addendum Note (Signed)
Addended by: [name], Valyncia Wiens J on: 07/18/2018 04:16 PM   Modules accepted: Orders

## 2018-07-18 NOTE — Assessment & Plan Note (Signed)
Given the persistent of her symptoms and the degree of her cough I am concerned she may have an atypical bacterial infection She had a chest x-ray done on the 19th of is negative so I will not repeat the chest x-ray today We will start a Z-Pak Tussionex cough syrup as needed Medrol Dosepak for cough there is partially reactive in nature Encouraged her to take over-the-counter cold medications Continue increased rest and fluids Call if no improvement

## 2018-07-18 NOTE — Telephone Encounter (Signed)
Pt informed of below.  

## 2018-07-18 NOTE — Telephone Encounter (Signed)
Rx emailed Thx 

## 2018-07-18 NOTE — Telephone Encounter (Signed)
Pt saw Dr. Lawerance BachBurns today "LRTI". Prescribed Z-Pack ,Tussionex, Medrol Dosepak. States she took all the medications at 1315, with food. Reports "Severe" nausea, no vomiting. "Just feel out of it." Pt questioning if she could take previously med prescribed, Phenergan with codeine.  States "I tolerated that better.' States she thinks the nausea is from the Tussionex; did take all meds at the same time. Care advise given per protocol. Please advise: [phone]  Reason for Disposition . Pharmacy calling with prescription questions and triager unable to answer question  Answer Assessment - Initial Assessment Questions 1. SYMPTOMS: "Do you have any symptoms?"     Nausea, no vomiting. "Out of it." 2. SEVERITY: If symptoms are present, ask "Are they mild, moderate or severe?"  Severe  Protocols used: MEDICATION QUESTION CALL-A-AH

## 2018-07-18 NOTE — Patient Instructions (Signed)
Take the antibiotic as prescribed - complete the entire course.  Use the cough syrup as needed.  Take the medrol dose pak (steroids) as prescribed.   Continue over the counter cold medication, advil and tylenol.  Increase your fluids and rest.    Call if no improvement

## 2018-07-18 NOTE — Telephone Encounter (Signed)
Returned call to pt to see which pharmacy the pt would like to receive medication since [facility] [address] is out of the medication currently. Pt request that the [facility] [address]. Spoke with Moses [facility] at [facility] who states they are currently out of the medication. Returned call to the pt to see which other pharmacies the pt would like to use to see if medication was in stock. Pt states [facility] has used [facility] [address] in the past and [facility] [address]. Contacted [facility] [address] and spoke with Amalia HaileyDustin who states medication is available at the pharmacy. On call provider, Dr. Posey ReaPlotnikov contacted to see [facility] [address].

## 2018-07-18 NOTE — Telephone Encounter (Signed)
I would advise not taking all the medications at once - space them out.  We can go back to the phenergan - sent to her pharmacy.

## 2018-07-18 NOTE — Telephone Encounter (Signed)
Copied from CRM [phone]#[id number]. Topic: Quick Communication - See Telephone Encounter >> Jul 18, 2018  5:50 PM Jens SomMedley, Jennifer A wrote: CRM for notification. See Telephone encounter for: 07/18/18.  Patient is calling regarding promethazine-codeine (PHENERGAN WITH CODEINE) 6.25-10 MG/5ML syrup [[id number]][[id number]] the pharmacy indicated   [facility] [address] [place], [place] - [address] [place] [address] Phone: [phone] Fax: [phone]  Does not have the medication in stock.  The pharmacy advised the patient to call the doctors office to have the dr send it in. Because it is a controlled substance it can not be transferred. The on call dr could do it.

## 2018-07-31 ENCOUNTER — Other Ambulatory Visit: Payer: Self-pay | Admitting: Neurology

## 2018-07-31 DIAGNOSIS — E7800 Pure hypercholesterolemia, unspecified: Secondary | ICD-10-CM

## 2018-08-20 ENCOUNTER — Ambulatory Visit: Payer: Medicare Other | Admitting: Neurology

## 2018-08-26 NOTE — Progress Notes (Signed)
Subjective:    Patient ID: Kathleen Solis, female    DOB: 09-19-1965, 53 y.o.   MRN: [id number]  HPI The patient is here for follow up.    Patient Active Problem List   Diagnosis Date Noted  . LRTI (lower respiratory tract infection) 07/18/2018  . Lumbar radiculopathy 05/15/2018  . Lumbar back pain 05/10/2018  . Body aches 05/08/2018  . Cough 05/08/2018  . B12 deficiency 02/23/2018  . Tingling 02/21/2018  . Onychomycosis 02/21/2018  . Left ovarian cyst 10/04/2017  . Nausea 06/07/2017  . Dark stools 06/07/2017  . Hyperlipidemia 02/14/2017  . Chest pain 02/14/2017  . Hyperglycemia 02/14/2017  . Tinnitus of right ear 11/16/2016  . URI (upper respiratory infection) 11/09/2016  . Generalized abdominal pain 09/13/2016  . Constipation 09/13/2016  . Great toe pain, left 08/16/2016  . Allergic rhinitis 08/16/2016  . Carotid arterial disease (HCC) 04/10/2016  . Diverticulitis of colon without hemorrhage 02/25/2016  . Bunion of left foot 02/08/2016  . Bone spur of right foot 02/08/2016  . Leg neuralgia, right 02/08/2016  . Episodic tension type headache 01/12/2016  . Benign essential HTN 03/03/2015  . History of cerebrovascular accident with residual deficit 03/03/2015    Current Outpatient Medications on File Prior to Visit  Medication Sig Dispense Refill  . aspirin EC 81 MG tablet Take 81 mg by mouth at bedtime.     Marland Kitchen atorvastatin (LIPITOR) 10 MG tablet Take 1 tablet (10 mg total) by mouth at bedtime. 90 tablet 2  . azithromycin (ZITHROMAX) 250 MG tablet Take two tabs the first day and then one tab daily for four days 6 tablet 0  . hydrochlorothiazide (HYDRODIURIL) 25 MG tablet TAKE 25 MG BY MOUTH ONCE DAILY (Patient taking differently: Take 25 mg by mouth at bedtime. TAKE 25 MG BY MOUTH ONCE DAILY) 90 tablet 1  . methylPREDNISolone (MEDROL DOSEPAK) 4 MG TBPK tablet 24 mg PO on day 1, then decr. by 4 mg/day x5 days 21 tablet 0  . metoprolol succinate (TOPROL-XL) 100 MG 24  hr tablet TAKE 100 MG BY MOUTH ONCE DAILY WITH OR IMMEDIATELY FOLLOWING A  MEAL (Patient taking differently: Take 100 mg by mouth at bedtime. ) 90 tablet 1  . promethazine-codeine (PHENERGAN WITH CODEINE) 6.25-10 MG/5ML syrup Take 5 mLs by mouth every 6 (six) hours as needed for cough. 120 mL 0   No current facility-administered medications on file prior to visit.     Past Medical History:  Diagnosis Date  . Diverticulitis   . Hyperlipidemia    diet controlled - no med  . Hypertension   . Sciatica    right  . Seasonal allergies   . Stroke [facility]) 2000   several mini strokes, weakness on right arm and leg  . SVD (spontaneous vaginal delivery)    x 2    Past Surgical History:  Procedure Laterality Date  . ABDOMINAL HYSTERECTOMY     partial  . ANAL FISSURE REPAIR    . APPENDECTOMY    . CHOLECYSTECTOMY    . CYSTOSCOPY N/A 10/04/2017   Procedure: CYSTOSCOPY;  Surgeon: Gerald Leitz, MD;  Location: [facility];  Service: Gynecology;  Laterality: N/A;  . FOOT SURGERY Left    big toe bunion removed  . LAPAROSCOPIC BILATERAL SALPINGO OOPHERECTOMY Bilateral 10/04/2017   Procedure: LAPAROSCOPIC BILATERAL SALPINGO OOPHORECTOMY, PELVIC WASHINGS;  Surgeon: Gerald Leitz, MD;  Location: [facility];  Service: Gynecology;  Laterality: Bilateral;  . TUBAL LIGATION    . WISDOM TOOTH  EXTRACTION      Social History   Socioeconomic History  . Marital status: Single    Spouse name: Not on file  . Number of children: 2  . Years of education: Not on file  . Highest education level: Associate degree: academic program  Occupational History  . Occupation: disabled  Social Needs  . Financial resource strain: Not on file  . Food insecurity:    Worry: Not on file    Inability: Not on file  . Transportation needs:    Medical: Not on file    Non-medical: Not on file  Tobacco Use  . Smoking status: Never Smoker  . Smokeless tobacco: Never Used  Substance and Sexual Activity  . Alcohol use: No  . Drug use: No   . Sexual activity: Not Currently    Birth control/protection: Surgical  Lifestyle  . Physical activity:    Days per week: Not on file    Minutes per session: Not on file  . Stress: Not on file  Relationships  . Social connections:    Talks on phone: Not on file    Gets together: Not on file    Attends religious service: Not on file    Active member of club or organization: Not on file    Attends meetings of clubs or organizations: Not on file    Relationship status: Not on file  Other Topics Concern  . Not on file  Social History Narrative   No regular exercise      Pt is right handed, she occasionally drinks tea, walks QOD. She lives with her 13 yr old son, he has mild cerebral palsy.    Family History  Problem Relation Age of Onset  . Breast cancer Sister   . Diabetes Sister   . Glaucoma Sister   . Cataracts Sister   . Hypertension Sister   . Anuerysm Mother   . Pancreatic cancer Father   . Retinal detachment Brother   . Cataracts Sister     Review of Systems     Objective:  There were no vitals filed for this visit. BP Readings from Last 3 Encounters:  07/18/18 118/78  07/10/18 104/83  06/28/18 128/86   Wt Readings from Last 3 Encounters:  07/18/18 180 lb 12.8 oz (82 kg)  07/10/18 176 lb (79.8 kg)  06/28/18 178 lb 12.8 oz (81.1 kg)   There is no height or weight on file to calculate BMI.   Physical Exam         Assessment & Plan:    See Problem List for Assessment and Plan of chronic medical problems.   This encounter was created in error - please disregard.

## 2018-08-26 NOTE — Patient Instructions (Signed)
  Tests ordered today. Your results will be released to MyChart (or called to you) after review, usually within 72hours after test completion. If any changes need to be made, you will be notified at that same time.  All other Health Maintenance issues reviewed.   All recommended immunizations and age-appropriate screenings are up-to-date or discussed.  No immunizations administered today.   Medications reviewed and updated.  Changes include :     Your prescription(s) have been submitted to your pharmacy. Please take as directed and contact our office if you believe you are having problem(s) with the medication(s).  A referral was ordered for   Please followup in    

## 2018-08-28 ENCOUNTER — Encounter: Payer: Medicare Other | Admitting: Internal Medicine

## 2018-09-05 ENCOUNTER — Ambulatory Visit: Payer: Medicare Other | Admitting: Physician Assistant

## 2018-09-12 ENCOUNTER — Other Ambulatory Visit: Payer: Medicare Other

## 2018-09-12 ENCOUNTER — Encounter: Payer: Self-pay | Admitting: Internal Medicine

## 2018-09-12 ENCOUNTER — Ambulatory Visit: Payer: Self-pay

## 2018-09-12 ENCOUNTER — Other Ambulatory Visit (INDEPENDENT_AMBULATORY_CARE_PROVIDER_SITE_OTHER): Payer: Medicare Other

## 2018-09-12 ENCOUNTER — Ambulatory Visit: Payer: Medicare Other | Admitting: Internal Medicine

## 2018-09-12 DIAGNOSIS — R109 Unspecified abdominal pain: Secondary | ICD-10-CM

## 2018-09-12 LAB — COMPREHENSIVE METABOLIC PANEL
ALT: 12 U/L (ref 0–35)
AST: 17 U/L (ref 0–37)
Albumin: 4.4 g/dL (ref 3.5–5.2)
Alkaline Phosphatase: 91 U/L (ref 39–117)
BUN: 13 mg/dL (ref 6–23)
CO2: 33 mEq/L — ABNORMAL HIGH (ref 19–32)
Calcium: 10.3 mg/dL (ref 8.4–10.5)
Chloride: 98 mEq/L (ref 96–112)
Creatinine, Ser: 0.92 mg/dL (ref 0.40–1.20)
GFR: 77.64 mL/min (ref >60.00)
Glucose, Bld: 100 mg/dL — ABNORMAL HIGH (ref 70–99)
Potassium: 4.5 mEq/L (ref 3.5–5.1)
Sodium: 139 mEq/L (ref 135–145)
Total Bilirubin: 0.9 mg/dL (ref 0.2–1.2)
Total Protein: 8 g/dL (ref 6.0–8.3)

## 2018-09-12 LAB — CBC WITH DIFFERENTIAL/PLATELET
Basophils Absolute: 0.1 10*3/uL (ref 0.0–0.1)
Basophils Relative: 1 % (ref 0.0–3.0)
Eosinophils Absolute: 0.1 10*3/uL (ref 0.0–0.7)
Eosinophils Relative: 1 % (ref 0.0–5.0)
HCT: 42.6 % (ref 36.0–46.0)
Hemoglobin: 14.9 g/dL (ref 12.0–15.0)
Lymphocytes Relative: 25.4 % (ref 12.0–46.0)
Lymphs Abs: 2 10*3/uL (ref 0.7–4.0)
MCHC: 34.9 g/dL (ref 30.0–36.0)
MCV: 90.5 fl (ref 78.0–100.0)
Monocytes Absolute: 0.6 10*3/uL (ref 0.1–1.0)
Monocytes Relative: 7.9 % (ref 3.0–12.0)
Neutro Abs: 5.2 10*3/uL (ref 1.4–7.7)
Neutrophils Relative %: 64.7 % (ref 43.0–77.0)
Platelets: 238 10*3/uL (ref 150.0–400.0)
RBC: 4.71 Mil/uL (ref 3.87–5.11)
RDW: 12.8 % (ref 11.5–15.5)
WBC: 8 10*3/uL (ref 4.0–10.5)

## 2018-09-12 LAB — LIPASE: Lipase: 33 U/L (ref 11.0–59.0)

## 2018-09-12 LAB — AMYLASE: Amylase: 129 U/L (ref 27–131)

## 2018-09-12 NOTE — Telephone Encounter (Signed)
Pt. Reports she has had abdominal pain x 4 days. Gradually getting worse. Hurts in the middle at her belly button and radiates to her back. Some nausea - no vomiting or diarrhea. No fever.Last BM yesterday.Agent made pt. Appointment for this afternoon. Instructed pt. If her symptoms worsen to go to ED. Verbalizes understanding.

## 2018-09-12 NOTE — Progress Notes (Signed)
Subjective:    Patient ID: Kathleen Solis, female    DOB: [birth year], 53 y.o.   MRN: [id number]  HPI The patient is here for an acute visit.   Her symptoms started 4 days ago and it is getting worse.  She has pain and in her left mid abdomen it radiates through to her back.  She is nauseous.  She has not vomited.  She feels feverish but denies fever.  She denies diarrhea, constipation, blood in the stool.  She denies gerd.    She has a history of diverticulitis.   Her pain is a 8/10.  She denies urinary symptoms.     Medications and allergies reviewed with patient and updated if appropriate.  Patient Active Problem List   Diagnosis Date Noted  . LRTI (lower respiratory tract infection) 07/18/2018  . Lumbar radiculopathy 05/15/2018  . Lumbar back pain 05/10/2018  . B12 deficiency 02/23/2018  . Tingling 02/21/2018  . Onychomycosis 02/21/2018  . Left ovarian cyst 10/04/2017  . Nausea 06/07/2017  . Dark stools 06/07/2017  . Hyperlipidemia 02/14/2017  . Chest pain 02/14/2017  . Hyperglycemia 02/14/2017  . Tinnitus of right ear 11/16/2016  . URI (upper respiratory infection) 11/09/2016  . Constipation 09/13/2016  . Great toe pain, left 08/16/2016  . Allergic rhinitis 08/16/2016  . Carotid arterial disease (HCC) 04/10/2016  . Diverticulitis of colon without hemorrhage 02/25/2016  . Bunion of left foot 02/08/2016  . Bone spur of right foot 02/08/2016  . Leg neuralgia, right 02/08/2016  . Episodic tension type headache 01/12/2016  . Benign essential HTN 03/03/2015  . History of cerebrovascular accident with residual deficit 03/03/2015    Current Outpatient Medications on File Prior to Visit  Medication Sig Dispense Refill  . aspirin EC 81 MG tablet Take 81 mg by mouth at bedtime.     Marland Kitchen atorvastatin (LIPITOR) 10 MG tablet Take 1 tablet (10 mg total) by mouth at bedtime. 90 tablet 2  . hydrochlorothiazide (HYDRODIURIL) 25 MG tablet TAKE 25 MG BY MOUTH ONCE DAILY (Patient  taking differently: Take 25 mg by mouth at bedtime. TAKE 25 MG BY MOUTH ONCE DAILY) 90 tablet 1  . metoprolol succinate (TOPROL-XL) 100 MG 24 hr tablet TAKE 100 MG BY MOUTH ONCE DAILY WITH OR IMMEDIATELY FOLLOWING A  MEAL (Patient taking differently: Take 100 mg by mouth at bedtime. ) 90 tablet 1   No current facility-administered medications on file prior to visit.     Past Medical History:  Diagnosis Date  . Diverticulitis   . Hyperlipidemia    diet controlled - no med  . Hypertension   . Sciatica    right  . Seasonal allergies   . Stroke [facility]) 2000   several mini strokes, weakness on right arm and leg  . SVD (spontaneous vaginal delivery)    x 2    Past Surgical History:  Procedure Laterality Date  . ABDOMINAL HYSTERECTOMY     partial  . ANAL FISSURE REPAIR    . APPENDECTOMY    . CHOLECYSTECTOMY    . CYSTOSCOPY N/A 10/04/2017   Procedure: CYSTOSCOPY;  Surgeon: Gerald Leitz, MD;  Location: [facility];  Service: Gynecology;  Laterality: N/A;  . FOOT SURGERY Left    big toe bunion removed  . LAPAROSCOPIC BILATERAL SALPINGO OOPHERECTOMY Bilateral 10/04/2017   Procedure: LAPAROSCOPIC BILATERAL SALPINGO OOPHORECTOMY, PELVIC WASHINGS;  Surgeon: Gerald Leitz, MD;  Location: [facility];  Service: Gynecology;  Laterality: Bilateral;  . TUBAL LIGATION    .  WISDOM TOOTH EXTRACTION      Social History   Socioeconomic History  . Marital status: Single    Spouse name: Not on file  . Number of children: 2  . Years of education: Not on file  . Highest education level: Associate degree: academic program  Occupational History  . Occupation: disabled  Social Needs  . Financial resource strain: Not on file  . Food insecurity:    Worry: Not on file    Inability: Not on file  . Transportation needs:    Medical: Not on file    Non-medical: Not on file  Tobacco Use  . Smoking status: Never Smoker  . Smokeless tobacco: Never Used  Substance and Sexual Activity  . Alcohol use: No  . Drug use:  No  . Sexual activity: Not Currently    Birth control/protection: Surgical  Lifestyle  . Physical activity:    Days per week: Not on file    Minutes per session: Not on file  . Stress: Not on file  Relationships  . Social connections:    Talks on phone: Not on file    Gets together: Not on file    Attends religious service: Not on file    Active member of club or organization: Not on file    Attends meetings of clubs or organizations: Not on file    Relationship status: Not on file  Other Topics Concern  . Not on file  Social History Narrative   No regular exercise      Pt is right handed, she occasionally drinks tea, walks QOD. She lives with her 82 yr old son, he has mild cerebral palsy.    Family History  Problem Relation Age of Onset  . Breast cancer Sister   . Diabetes Sister   . Glaucoma Sister   . Cataracts Sister   . Hypertension Sister   . Anuerysm Mother   . Pancreatic cancer Father   . Retinal detachment Brother   . Cataracts Sister     Review of Systems  Constitutional: Negative for chills and fever.  HENT: Negative for congestion, ear pain, sinus pain and sore throat.   Respiratory: Negative for cough, shortness of breath and wheezing.   Cardiovascular: Negative for chest pain.  Gastrointestinal: Positive for abdominal pain (epigastric - radiates to the back) and nausea. Negative for blood in stool, constipation, diarrhea and vomiting.       No gerd  Neurological: Negative for light-headedness and headaches.       Objective:   Vitals:   09/12/18 1430  BP: 122/80  Pulse: 62  Resp: 18  Temp: 98.6 F (37 C)  SpO2: 96%   BP Readings from Last 3 Encounters:  09/12/18 122/80  07/18/18 118/78  07/10/18 104/83   Wt Readings from Last 3 Encounters:  09/12/18 180 lb 6.4 oz (81.8 kg)  07/18/18 180 lb 12.8 oz (82 kg)  07/10/18 176 lb (79.8 kg)   Body mass index is 30.02 kg/m.   Physical Exam Constitutional:      General: She is not in acute  distress.    Appearance: Normal appearance. She is not ill-appearing (but appears uncomfortable).  HENT:     Head: Normocephalic and atraumatic.  Cardiovascular:     Rate and Rhythm: Normal rate and regular rhythm.     Heart sounds: No murmur.  Pulmonary:     Effort: Pulmonary effort is normal.     Breath sounds: Normal breath sounds. No wheezing or  rhonchi.  Abdominal:     General: Bowel sounds are normal.     Palpations: Abdomen is soft.     Tenderness: There is abdominal tenderness (left middle quadrant). There is no guarding or rebound.     Hernia: No hernia is present.  Skin:    General: Skin is warm and dry.  Neurological:     Mental Status: She is alert.            Assessment & Plan:    See Problem List for Assessment and Plan of chronic medical problems.

## 2018-09-12 NOTE — Patient Instructions (Signed)
Have blood work done today.   A ct scan was ordered.  We will call you with the results.

## 2018-09-12 NOTE — Telephone Encounter (Signed)
Sees you at 2:30

## 2018-09-12 NOTE — Telephone Encounter (Signed)
  Reason for Disposition . [1] MODERATE pain (e.g., interferes with normal activities) AND [2] pain comes and goes (cramps) AND [3] present > 24 hours  (Exception: pain with Vomiting or Diarrhea - see that Guideline)  Answer Assessment - Initial Assessment Questions 1. LOCATION: "Where does it hurt?"      Above the belly button, middle and into her back 2. RADIATION: "Does the pain shoot anywhere else?" (e.g., chest, back)     Back 3. ONSET: "When did the pain begin?" (e.g., minutes, hours or days ago)      4 days ago 4. SUDDEN: "Gradual or sudden onset?"     Gradual 5. PATTERN "Does the pain come and go, or is it constant?"    - If constant: "Is it getting better, staying the same, or worsening?"      (Note: Constant means the pain never goes away completely; most serious pain is constant and it progresses)     - If intermittent: "How long does it last?" "Do you have pain now?"     (Note: Intermittent means the pain goes away completely between bouts)     Constant 6. SEVERITY: "How bad is the pain?"  (e.g., Scale 1-10; mild, moderate, or severe)   - MILD (1-3): doesn't interfere with normal activities, abdomen soft and not tender to touch    - MODERATE (4-7): interferes with normal activities or awakens from sleep, tender to touch    - SEVERE (8-10): excruciating pain, doubled over, unable to do any normal activities      8 7. RECURRENT SYMPTOM: "Have you ever had this type of abdominal pain before?" If so, ask: "When was the last time?" and "What happened that time?"      Yes 8. CAUSE: "What do you think is causing the abdominal pain?"     Unsure 9. RELIEVING/AGGRAVATING FACTORS: "What makes it better or worse?" (e.g., movement, antacids, bowel movement)     No 10. OTHER SYMPTOMS: "Has there been any vomiting, diarrhea, constipation, or urine problems?"       No constipation 11. PREGNANCY: "Is there any chance you are pregnant?" "When was your last menstrual period?"        n/a  Protocols used: ABDOMINAL PAIN - [facility]

## 2018-09-12 NOTE — Assessment & Plan Note (Signed)
Abdominal pain in LMQ x 4 days, pain 8/10 Associated with nausea, subjective fevers No change in bowels, urine symptoms H/o diverticulitis  Cbc, cmp, lipase, amylase Ct Ab/Pelvis to r/o diverticulitis

## 2018-09-13 ENCOUNTER — Ambulatory Visit: Payer: Self-pay

## 2018-09-13 ENCOUNTER — Ambulatory Visit (INDEPENDENT_AMBULATORY_CARE_PROVIDER_SITE_OTHER)
Admission: RE | Admit: 2018-09-13 | Discharge: 2018-09-13 | Disposition: A | Discharge status: Home / Self Care | Payer: Medicare Other | Source: Ambulatory Visit | Attending: Internal Medicine | Admitting: Internal Medicine

## 2018-09-13 DIAGNOSIS — R109 Unspecified abdominal pain: Secondary | ICD-10-CM | POA: Diagnosis not present

## 2018-09-13 MED ORDER — IOPAMIDOL (ISOVUE-300) INJECTION 61%
100.0000 mL | Freq: Once | INTRAVENOUS | Status: AC | PRN
  Administered 2018-09-13: 100 mL via INTRAVENOUS

## 2018-09-13 NOTE — Telephone Encounter (Signed)
i've reviewed labs/ct imaging, no abnormal findings---routing to dr Yetta Barre, please advise in the absence of dr burns, should the patient go to ED or are you ok with calling something in for pain/nausea----I will call patient back, thanks

## 2018-09-13 NOTE — Telephone Encounter (Signed)
Patient states pain is about the same as yesterday, comes and goes, but it's often, she's more frustrated with nausea---patient advised I have routed this message to another provider in the absence of dr burns, he may decide to call something in, she can check with her pharmacy overnight, but if she worsens, she needs to go to ED tonite---dr burns is back tomorrow, I will call patient on Friday 1/24 to see how she is feeling

## 2018-09-13 NOTE — Telephone Encounter (Signed)
Phone call returned to pt.  Drinking sips of water and [place], and eating saltine crackers.  Stated the left upper quadrant abdominal pain is at 10/10.  Reported the nausea has continued.  Denied vomiting.  Stated she feels feverish, but doesn't have a temperature.  Is requesting CT scan results and something for the nausea.    Called FC.  Was advised to send note to office in Dr. Lawerance Bach absence, and will have another provider review and give recommendations.  Pt. Advised of the above plan.  Verb. Understanding.   Message from Terisa Starr sent at 09/13/2018 2:47 PM EST   Pt states she had a CT yesterday of her stomach. The results are not back yet per the nurse. She is sick on her stomach and asking for something for the nausea and for the pain.

## 2018-09-14 ENCOUNTER — Other Ambulatory Visit: Payer: Self-pay | Admitting: Internal Medicine

## 2018-09-14 MED ORDER — ONDANSETRON 4 MG PO TBDP: 4.0000 mg | ORAL_TABLET | Freq: Three times a day (TID) | ORAL | 0 refills | Status: DC | PRN

## 2018-09-14 NOTE — Telephone Encounter (Signed)
Routing to dr burns, I dont think a response was provided by dr Yetta Barre yesterday---please advise what you think the patient needs to do, I will call patient back, thanks

## 2018-09-14 NOTE — Telephone Encounter (Signed)
zofran sent to pharmacy. 

## 2018-09-14 NOTE — Telephone Encounter (Signed)
Patient advised rx has been sent in 

## 2018-09-25 ENCOUNTER — Other Ambulatory Visit: Payer: Self-pay | Admitting: Internal Medicine

## 2018-09-25 DIAGNOSIS — Z1231 Encounter for screening mammogram for malignant neoplasm of breast: Secondary | ICD-10-CM

## 2018-10-17 ENCOUNTER — Ambulatory Visit: Payer: Medicare Other | Admitting: Family

## 2018-10-17 ENCOUNTER — Encounter: Payer: Self-pay | Admitting: Family

## 2018-10-17 VITALS — BP 122/80 | HR 72 | Temp 98.4°F | Ht 65.0 in | Wt 181.2 lb

## 2018-10-17 DIAGNOSIS — J069 Acute upper respiratory infection, unspecified: Secondary | ICD-10-CM | POA: Diagnosis not present

## 2018-10-17 DIAGNOSIS — B349 Viral infection, unspecified: Secondary | ICD-10-CM

## 2018-10-17 DIAGNOSIS — J9801 Acute bronchospasm: Secondary | ICD-10-CM

## 2018-10-17 DIAGNOSIS — B9789 Other viral agents as the cause of diseases classified elsewhere: Secondary | ICD-10-CM

## 2018-10-17 MED ORDER — AZITHROMYCIN 250 MG PO TABS: ORAL_TABLET | ORAL | 0 refills | Status: DC

## 2018-10-17 MED ORDER — PROMETHAZINE-CODEINE 6.25-10 MG/5ML PO SYRP: 5.0000 mL | ORAL_SOLUTION | Freq: Four times a day (QID) | ORAL | 0 refills | Status: DC | PRN

## 2018-10-17 NOTE — Progress Notes (Signed)
Kathleen Solis is a 53 y.o. female with the following history as recorded in EpicCare:  Patient Active Problem List   Diagnosis Date Noted  . Abdominal pain 09/12/2018  . LRTI (lower respiratory tract infection) 07/18/2018  . Lumbar radiculopathy 05/15/2018  . Lumbar back pain 05/10/2018  . B12 deficiency 02/23/2018  . Tingling 02/21/2018  . Onychomycosis 02/21/2018  . Left ovarian cyst 10/04/2017  . Nausea 06/07/2017  . Dark stools 06/07/2017  . Hyperlipidemia 02/14/2017  . Chest pain 02/14/2017  . Hyperglycemia 02/14/2017  . Tinnitus of right ear 11/16/2016  . URI (upper respiratory infection) 11/09/2016  . Constipation 09/13/2016  . Great toe pain, left 08/16/2016  . Allergic rhinitis 08/16/2016  . Carotid arterial disease (HCC) 04/10/2016  . Diverticulitis of colon without hemorrhage 02/25/2016  . Bunion of left foot 02/08/2016  . Bone spur of right foot 02/08/2016  . Leg neuralgia, right 02/08/2016  . Episodic tension type headache 01/12/2016  . Benign essential HTN 03/03/2015  . History of cerebrovascular accident with residual deficit 03/03/2015    Current Outpatient Medications  Medication Sig Dispense Refill  . aspirin EC 81 MG tablet Take 81 mg by mouth at bedtime.     Marland Kitchen atorvastatin (LIPITOR) 10 MG tablet Take 1 tablet (10 mg total) by mouth at bedtime. 90 tablet 2  . hydrochlorothiazide (HYDRODIURIL) 25 MG tablet TAKE 25 MG BY MOUTH ONCE DAILY (Patient taking differently: Take 25 mg by mouth at bedtime. TAKE 25 MG BY MOUTH ONCE DAILY) 90 tablet 1  . metoprolol succinate (TOPROL-XL) 100 MG 24 hr tablet TAKE 100 MG BY MOUTH ONCE DAILY WITH OR IMMEDIATELY FOLLOWING A  MEAL (Patient taking differently: Take 100 mg by mouth at bedtime. ) 90 tablet 1  . azithromycin (ZITHROMAX) 250 MG tablet 2 tabs po qd x 1 day; 1 tablet per day x 4 days; 6 tablet 0  . ondansetron (ZOFRAN ODT) 4 MG disintegrating tablet Take 1 tablet (4 mg total) by mouth every 8 (eight) hours as needed  for nausea or vomiting. (Patient not taking: Reported on 10/17/2018) 20 tablet 0  . promethazine-codeine (PHENERGAN WITH CODEINE) 6.25-10 MG/5ML syrup Take 5 mLs by mouth every 6 (six) hours as needed for cough. 118 mL 0   No current facility-administered medications for this visit.     Allergies: Darvon [propoxyphene]; Morphine and related; Sulfa antibiotics; Tussionex pennkinetic er [employer] er]; Buprenorphine hcl; and Prochlorperazine edisylate  Past Medical History:  Diagnosis Date  . Diverticulitis   . Hyperlipidemia    diet controlled - no med  . Hypertension   . Sciatica    right  . Seasonal allergies   . Stroke [facility]) 2000   several mini strokes, weakness on right arm and leg  . SVD (spontaneous vaginal delivery)    x 2    Past Surgical History:  Procedure Laterality Date  . ABDOMINAL HYSTERECTOMY     partial  . ANAL FISSURE REPAIR    . APPENDECTOMY    . CHOLECYSTECTOMY    . CYSTOSCOPY N/A 10/04/2017   Procedure: CYSTOSCOPY;  Surgeon: Gerald Leitz, MD;  Location: [facility];  Service: Gynecology;  Laterality: N/A;  . FOOT SURGERY Left    big toe bunion removed  . LAPAROSCOPIC BILATERAL SALPINGO OOPHERECTOMY Bilateral 10/04/2017   Procedure: LAPAROSCOPIC BILATERAL SALPINGO OOPHORECTOMY, PELVIC WASHINGS;  Surgeon: Gerald Leitz, MD;  Location: [facility];  Service: Gynecology;  Laterality: Bilateral;  . TUBAL LIGATION    . WISDOM TOOTH EXTRACTION  Family History  Problem Relation Age of Onset  . Breast cancer Sister   . Diabetes Sister   . Glaucoma Sister   . Cataracts Sister   . Hypertension Sister   . Anuerysm Mother   . Pancreatic cancer Father   . Retinal detachment Brother   . Cataracts Sister     Social History   Tobacco Use  . Smoking status: Never Smoker  . Smokeless tobacco: Never Used  Substance Use Topics  . Alcohol use: No    Subjective:  2 day history of cough/ congestion; no fever but alternating between hot and cold; notes that upper  back is sore- "feels achy." + sore throat; No shortness of breath or wheezing; cough is keeping awake at night; using Tylenol for symptom relief; no family members sick; no recent travel;  Had a normal CXR in November 2019; was treated for LRTI in November- did feel like it cleared completely;    Objective:  Vitals:   10/17/18 0900  BP: 122/80  Pulse: 72  Temp: 98.4 F (36.9 C)  TempSrc: Oral  SpO2: 97%  Weight: 181 lb 3.2 oz (82.2 kg)  Height: 5\' 5"  (1.651 m)    General: Well developed, well nourished, in no acute distress  Skin : Warm and dry.  Head: Normocephalic and atraumatic  Eyes: Sclera and conjunctiva clear; pupils round and reactive to light; extraocular movements intact  Ears: External normal; canals clear; tympanic membranes normal  Oropharynx: Pink, supple. No suspicious lesions  Neck: Supple without thyromegaly, adenopathy  Lungs: Respirations unlabored; clear to auscultation bilaterally without wheeze, rales, rhonchi  CVS exam: normal rate and regular rhythm.  Neurologic: Alert and oriented; speech intact; face symmetrical; moves all extremities well; CNII-XII intact without focal deficit   Assessment:  1. Viral URI with cough   2. Acute bronchospasm due to viral infection     Plan:  Rapid flu is negative; do not feel patient needs another CXR today; will treat based on symptoms; continue Tylenol, water; patient asks for refill of cough syrup with Phenergan with Codeine- states she can tolerate with no difficulty; sample of BREO 100 mg- use daily x 7-10 days; Rx for Z-pak- patient to hold and fill only if worsening in the next 48 hours. Follow-up worse, no better.   No follow-ups on file.  No orders of the defined types were placed in this encounter.   Requested Prescriptions   Signed Prescriptions Disp Refills  . promethazine-codeine (PHENERGAN WITH CODEINE) 6.25-10 MG/5ML syrup 118 mL 0    Sig: Take 5 mLs by mouth every 6 (six) hours as needed for cough.  Marland Kitchen  azithromycin (ZITHROMAX) 250 MG tablet 6 tablet 0    Sig: 2 tabs po qd x 1 day; 1 tablet per day x 4 days;

## 2018-10-18 ENCOUNTER — Ambulatory Visit: Payer: Medicare Other | Admitting: Internal Medicine

## 2018-10-22 ENCOUNTER — Other Ambulatory Visit: Payer: Self-pay | Admitting: Internal Medicine

## 2018-10-22 NOTE — Telephone Encounter (Signed)
metoprolol succinate (TOPROL-XL) 100 MG 24 hr tablet  hydrochlorothiazide (HYDRODIURIL) 25 MG tablet   Pt is out of these 2 medicatons and pharmacy advised her to call.   [facility] [address] [place], [place] - [address] [phone] (Phone) [phone] (Fax)

## 2018-10-30 NOTE — Progress Notes (Signed)
NEUROLOGY FOLLOW UP OFFICE NOTE  Kathleen Solis [id number]  HISTORY OF PRESENT ILLNESS: Kathleen Solis is a 53 year old right-handed woman with hypertension, hyperlipidemia, carotid artery disease, tension type headache and history of stroke who follows up for stroke.  UPDATE:  Current medications: Aspirin 81 mg daily, hydrochlorothiazide, Toprol-XL  LDL last May was 122.  I advised her to start atorvastatin 10 mg daily.  Repeat LDL from 02/21/18 was 68 and Hgb A1c was 4.8.  She was supposed to have a carotid doppler but it was never scheduled.  HISTORY: In 2001, she had a stroke while pregnant with her son.  She was told it was due to elevated blood pressure.  She presented with slurred speech and right leg weakness.  As a result, her son was born with cerebral palsy.  She has been followed by multiple neurologists.   Since the stroke, she has been on ASA 81mg  daily.  Non-contrast MRI brain report from 02/15/14 demonstrated minimal scattered T2 hyperintensities in the left periventricular white matter regions suggesting chronic microvascular degenerative disease but no acute infarcts, mass lesions or demyelinating disease.  This was also noted on prior MRI from 2012.  She presented to [facility] on 11/24/15 for acute onset right sided headache and numbness with trouble walking.  CT of head was personally reviewed and revealed no acute findings.  MRI of brain was ordered but she left AMA.  She followed up with neurologist, Dr. Stacy Gardner the following month.  MRI of brain with and without contrast performed on 01/19/16 again demonstrated patchy nonspecific cerebral white matter signal changes in left frontal lobe and periatrial white matter, possibly indicative of remote ischemic stroke.  Abnormal flow in the left cervical ICA noted as well.  Carotid doppler from 03/16/16 reportedly showed no hemodynamically significant stenosis.  A repeat MRI of the brain from 11/30/16 was personally  reviewed to evaluate tinnitus in the right ear and again demonstrated chronic white matter changes, left greater than right, in watershed distribution, but no acute findings.  She was treated for episodic tension type headaches as well.  The are moderate intensity non-throbbing headache on the top of her head.  They last 15 to 20 minutes and occur daily.  There are no associated symptoms such as nausea, photophobia, phonophobia or unilateral numbness or weakness.  She was always hesitant about starting a preventative.  She treated them with ibuprofen until she stopped because it was found to be the cause of her tinnitus.  She was previously on atorvastatin 40mg  but stopped due to leg pain.  She has been on disability since 2007.  She continues to have right leg weakness and trouble with speech.  She has memory deficits.  She is overall independent.  On one occasion, she got lost while driving on familiar route.  She usually uses a GPS.  Since her stroke, she has insomnia.  She previously had a sleep study that demonstrated mild OSA not requiring CPAP.   She denies depression.  PAST MEDICAL HISTORY: Past Medical History:  Diagnosis Date  . Diverticulitis   . Hyperlipidemia    diet controlled - no med  . Hypertension   . Sciatica    right  . Seasonal allergies   . Stroke [facility]) 2000   several mini strokes, weakness on right arm and leg  . SVD (spontaneous vaginal delivery)    x 2    MEDICATIONS: Current Outpatient Medications on File Prior to Visit  Medication Sig Dispense  Refill  . aspirin EC 81 MG tablet Take 81 mg by mouth at bedtime.     Marland Kitchen atorvastatin (LIPITOR) 10 MG tablet Take 1 tablet (10 mg total) by mouth at bedtime. 90 tablet 2  . azithromycin (ZITHROMAX) 250 MG tablet 2 tabs po qd x 1 day; 1 tablet per day x 4 days; 6 tablet 0  . hydrochlorothiazide (HYDRODIURIL) 25 MG tablet Take 1 tablet by mouth once daily 90 tablet 0  . metoprolol succinate (TOPROL-XL) 100 MG 24 hr  tablet TAKE 100 MG BY MOUTH ONCE DAILY WITH OR IMMEDIATELY FOLLOWING A  MEAL (Patient taking differently: Take 100 mg by mouth at bedtime. ) 90 tablet 1  . ondansetron (ZOFRAN ODT) 4 MG disintegrating tablet Take 1 tablet (4 mg total) by mouth every 8 (eight) hours as needed for nausea or vomiting. (Patient not taking: Reported on 10/17/2018) 20 tablet 0  . promethazine-codeine (PHENERGAN WITH CODEINE) 6.25-10 MG/5ML syrup Take 5 mLs by mouth every 6 (six) hours as needed for cough. 118 mL 0   No current facility-administered medications on file prior to visit.     ALLERGIES: Allergies  Allergen Reactions  . Darvon [Propoxyphene] Other (See Comments)    Gi problems  . Morphine And Related Itching  . Sulfa Antibiotics Hives  . Tussionex Pennkinetic Er [Hydrocod Polst-Cpm Polst Er]     Nausea, vomiting  . Buprenorphine Hcl Itching  . Prochlorperazine Edisylate Itching and Other (See Comments)    "Acts weird: Hallucinations"    FAMILY HISTORY: Family History  Problem Relation Age of Onset  . Breast cancer Sister   . Diabetes Sister   . Glaucoma Sister   . Cataracts Sister   . Hypertension Sister   . Anuerysm Mother   . Pancreatic cancer Father   . Retinal detachment Brother   . Cataracts Sister    SOCIAL HISTORY: Social History   Socioeconomic History  . Marital status: Single    Spouse name: Not on file  . Number of children: 2  . Years of education: Not on file  . Highest education level: Associate degree: academic program  Occupational History  . Occupation: disabled  Social Needs  . Financial resource strain: Not on file  . Food insecurity:    Worry: Not on file    Inability: Not on file  . Transportation needs:    Medical: Not on file    Non-medical: Not on file  Tobacco Use  . Smoking status: Never Smoker  . Smokeless tobacco: Never Used  Substance and Sexual Activity  . Alcohol use: No  . Drug use: No  . Sexual activity: Not Currently    Birth  control/protection: Surgical  Lifestyle  . Physical activity:    Days per week: Not on file    Minutes per session: Not on file  . Stress: Not on file  Relationships  . Social connections:    Talks on phone: Not on file    Gets together: Not on file    Attends religious service: Not on file    Active member of club or organization: Not on file    Attends meetings of clubs or organizations: Not on file    Relationship status: Not on file  . Intimate partner violence:    Fear of current or ex partner: Not on file    Emotionally abused: Not on file    Physically abused: Not on file    Forced sexual activity: Not on file  Other Topics  Concern  . Not on file  Social History Narrative   No regular exercise      Pt is right handed, she occasionally drinks tea, walks QOD. She lives with her 52 yr old son, he has mild cerebral palsy.    REVIEW OF SYSTEMS: Constitutional: No fevers, chills, or sweats, no generalized fatigue, change in appetite Eyes: No visual changes, double vision, eye pain Ear, nose and throat: No hearing loss, ear pain, nasal congestion, sore throat Cardiovascular: No chest pain, palpitations Respiratory:  No shortness of breath at rest or with exertion, wheezes GastrointestinaI: No nausea, vomiting, diarrhea, abdominal pain, fecal incontinence Genitourinary:  No dysuria, urinary retention or frequency Musculoskeletal:  No neck pain, back pain Integumentary: No rash, pruritus, skin lesions Neurological: as above Psychiatric: No depression, insomnia, anxiety Endocrine: No palpitations, fatigue, diaphoresis, mood swings, change in appetite, change in weight, increased thirst Hematologic/Lymphatic:  No purpura, petechiae. Allergic/Immunologic: no itchy/runny eyes, nasal congestion, recent allergic reactions, rashes  PHYSICAL EXAM: Blood pressure 112/68, pulse 66, temperature 98 F (36.7 C), height 5\' 5"  (1.651 m), weight 184 lb (83.5 kg), SpO2 99 %. General: No  acute distress.  Patient appears well-groomed.   Head:  Normocephalic/atraumatic Eyes:  Fundi examined but not visualized Neck: supple, no paraspinal tenderness, full range of motion Heart:  Regular rate and rhythm Lungs:  Clear to auscultation bilaterally Back: No paraspinal tenderness Neurological Exam: alert and oriented to person, place, and time. Attention span and concentration impaired, recent memory poor, remote memory intact, fund of knowledge intact.  Expressive aphasia.  Visuospatial and executive dysfunction.  Decreased right V1-V3 sensation.  Otherwise, CN II-XII intact. Bulk and tone normal.  4+/5 right lower extremity, otherwise 5/5 throughout.  Sensation to pinprick reduced in the right upper and lower extremities.  Vibratory sensation intact.  Deep tendon reflexes 2+ throughout.  Finger-to-nose testing without dysmetria.  Right hemiparetic gait.  Romberg negative.  IMPRESSION: 1.  Right sided lower extremity hemiplegia secondary to left hemispheric stroke 2.  Expressive aphasia as late effect of left hemispheric stroke 3.  Vascular cognitive impairment 4.  Tension type headache, not intractable  PLAN: 1.  ASA 81mg  daily for secondary stroke prevention 2.  Continue atorvastatin (LDL goal less than 70) 3.  Continue blood pressure control 4.  Mediterranean diet 5.  Check carotid doppler 6.  Follow up in one year.  22 minutes spent face to face with patient, over 50% spent discussing management.   Shon MilletAdam Janan Bogie, DO  CC: Cheryll CockayneStacy Burns, MD

## 2018-10-31 ENCOUNTER — Other Ambulatory Visit: Payer: Self-pay

## 2018-10-31 ENCOUNTER — Ambulatory Visit: Payer: Medicare Other | Admitting: Neurology

## 2018-10-31 ENCOUNTER — Encounter: Payer: Self-pay | Admitting: Neurology

## 2018-10-31 VITALS — BP 112/68 | HR 66 | Temp 98.0°F | Ht 65.0 in | Wt 184.0 lb

## 2018-10-31 DIAGNOSIS — I10 Essential (primary) hypertension: Secondary | ICD-10-CM

## 2018-10-31 DIAGNOSIS — I69349 Monoplegia of lower limb following cerebral infarction affecting unspecified side: Secondary | ICD-10-CM

## 2018-10-31 DIAGNOSIS — E785 Hyperlipidemia, unspecified: Secondary | ICD-10-CM

## 2018-10-31 NOTE — Patient Instructions (Addendum)
1.  Check carotid ultrasound  We have sent a referral to [facility] for your Ultrasoundand they will call you directly to schedule your appt. They are located at [address] [facility]. If you need to contact them directly please call [phone].  2.  Continue aspirin 81mg  daily  3.  Continue atorvastatin 10mg  daily 4.  Continue blood pressure medications 5.  Mediterranean diet (see below) 6.  Follow up in one year   Mediterranean Diet A Mediterranean diet refers to food and lifestyle choices that are based on the traditions of countries located on the [employer] [place]. This way of eating has been shown to help prevent certain conditions and improve outcomes for people who have chronic diseases, like kidney disease and heart disease. What are tips for following this plan? Lifestyle  Cook and eat meals together with your family, when possible.  Drink enough fluid to keep your urine clear or pale yellow.  Be physically active every day. This includes: ? Aerobic exercise like running or swimming. ? Leisure activities like gardening, walking, or housework.  Get 7-8 hours of sleep each night.  If recommended by your health care provider, drink red wine in moderation. This means 1 glass a day for nonpregnant women and 2 glasses a day for men. A glass of wine equals 5 oz (150 mL). Reading food labels   Check the serving size of packaged foods. For foods such as rice and pasta, the serving size refers to the amount of cooked product, not dry.  Check the total fat in packaged foods. Avoid foods that have saturated fat or trans fats.  Check the ingredients list for added sugars, such as corn syrup. Shopping  At the grocery store, buy most of your food from the areas near the walls of the store. This includes: ? Fresh fruits and vegetables (produce). ? Grains, beans, nuts, and seeds. Some of these may be available in unpackaged forms or large amounts (in bulk). ? Fresh seafood.  ? Poultry and eggs. ? Low-fat dairy products.  Buy whole ingredients instead of prepackaged foods.  Buy fresh fruits and vegetables in-season from local farmers markets.  Buy frozen fruits and vegetables in resealable bags.  If you do not have access to quality fresh seafood, buy precooked frozen shrimp or canned fish, such as tuna, salmon, or sardines.  Buy small amounts of raw or cooked vegetables, salads, or olives from the deli or salad bar at your store.  Stock your pantry so you always have certain foods on hand, such as olive oil, canned tuna, canned tomatoes, rice, pasta, and beans. Cooking  Cook foods with extra-virgin olive oil instead of using butter or other vegetable oils.  Have meat as a side dish, and have vegetables or grains as your main dish. This means having meat in small portions or adding small amounts of meat to foods like pasta or stew.  Use beans or vegetables instead of meat in common dishes like chili or lasagna.  Experiment with different cooking methods. Try roasting or broiling vegetables instead of steaming or sauteing them.  Add frozen vegetables to soups, stews, pasta, or rice.  Add nuts or seeds for added healthy fat at each meal. You can add these to yogurt, salads, or vegetable dishes.  Marinate fish or vegetables using olive oil, lemon juice, garlic, and fresh herbs. Meal planning   Plan to eat 1 vegetarian meal one day each week. Try to work up to 2 vegetarian meals, if possible.  Eat seafood 2 or more times a week.  Have healthy snacks readily available, such as: ? Vegetable sticks with hummus. ? [place] yogurt. ? Fruit and nut trail mix.  Eat balanced meals throughout the week. This includes: ? Fruit: 2-3 servings a day ? Vegetables: 4-5 servings a day ? Low-fat dairy: 2 servings a day ? Fish, poultry, or lean meat: 1 serving a day ? Beans and legumes: 2 or more servings a week ? Nuts and seeds: 1-2 servings a day ? Whole grains:  6-8 servings a day ? Extra-virgin olive oil: 3-4 servings a day  Limit red meat and sweets to only a few servings a month What are my food choices?  Mediterranean diet ? Recommended ? Grains: Whole-grain pasta. Brown rice. Bulgar wheat. Polenta. Couscous. Whole-wheat bread. Orpah Cobb. ? Vegetables: Artichokes. Beets. Broccoli. Cabbage. Carrots. Eggplant. Green beans. Chard. Kale. Spinach. Onions. Leeks. Peas. Squash. Tomatoes. Peppers. Radishes. ? Fruits: Apples. Apricots. Avocado. Berries. Bananas. Cherries. Dates. Figs. Grapes. Lemons. Melon. Oranges. Peaches. Plums. Pomegranate. ? Meats and other protein foods: Beans. Almonds. Sunflower seeds. Pine nuts. Peanuts. Cod. Salmon. Scallops. Shrimp. Tuna. Tilapia. Clams. Oysters. Eggs. ? Dairy: Low-fat milk. Cheese. Greek yogurt. ? Beverages: Water. Red wine. Herbal tea. ? Fats and oils: Extra virgin olive oil. Avocado oil. Grape seed oil. ? Sweets and desserts: [place] yogurt with honey. Baked apples. Poached pears. Trail mix. ? Seasoning and other foods: Basil. Cilantro. Coriander. Cumin. Mint. Parsley. Sage. Rosemary. Tarragon. Garlic. Oregano. Thyme. Pepper. Balsalmic vinegar. Tahini. Hummus. Tomato sauce. Olives. Mushrooms. ? Limit these ? Grains: Prepackaged pasta or rice dishes. Prepackaged cereal with added sugar. ? Vegetables: Deep fried potatoes (french fries). ? Fruits: Fruit canned in syrup. ? Meats and other protein foods: Beef. Pork. Lamb. Poultry with skin. Hot dogs. Tomasa Blase. ? Dairy: Ice cream. Sour cream. Whole milk. ? Beverages: Juice. Sugar-sweetened soft drinks. Beer. Liquor and spirits. ? Fats and oils: Butter. Canola oil. Vegetable oil. Beef fat (tallow). Lard. ? Sweets and desserts: Cookies. Cakes. Pies. Candy. ? Seasoning and other foods: Mayonnaise. Premade sauces and marinades. ? The items listed may not be a complete list. Talk with your dietitian about what dietary choices are right for you. Summary  The  Mediterranean diet includes both food and lifestyle choices.  Eat a variety of fresh fruits and vegetables, beans, nuts, seeds, and whole grains.  Limit the amount of red meat and sweets that you eat.  Talk with your health care provider about whether it is safe for you to drink red wine in moderation. This means 1 glass a day for nonpregnant women and 2 glasses a day for men. A glass of wine equals 5 oz (150 mL). This information is not intended to replace advice given to you by your health care provider. Make sure you discuss any questions you have with your health care provider. Document Released: 03/31/2016 Document Revised: 05/03/2016 Document Reviewed: 03/31/2016 Elsevier Interactive Patient Education  2019 [employer].

## 2018-10-31 NOTE — Addendum Note (Signed)
Addended by: Horatio Pel on: 10/31/2018 09:33 AM   Modules accepted: Orders

## 2018-11-06 ENCOUNTER — Ambulatory Visit: Payer: Medicare Other | Admitting: Internal Medicine

## 2018-11-06 ENCOUNTER — Encounter: Payer: Self-pay | Admitting: Internal Medicine

## 2018-11-06 ENCOUNTER — Other Ambulatory Visit: Payer: Self-pay

## 2018-11-06 DIAGNOSIS — J069 Acute upper respiratory infection, unspecified: Secondary | ICD-10-CM | POA: Diagnosis not present

## 2018-11-06 DIAGNOSIS — B9789 Other viral agents as the cause of diseases classified elsewhere: Secondary | ICD-10-CM

## 2018-11-06 MED ORDER — PROMETHAZINE-DM 6.25-15 MG/5ML PO SYRP: 5.0000 mL | ORAL_SOLUTION | Freq: Every evening | ORAL | 0 refills | Status: DC | PRN

## 2018-11-06 MED ORDER — FLUTICASONE PROPIONATE 50 MCG/ACT NA SUSP: 2.0000 | Freq: Every day | NASAL | 6 refills | Status: DC

## 2018-11-06 NOTE — Assessment & Plan Note (Signed)
Rx for flonase and promethazine/dm cough medicine. Given no fever co-vid 19 testing was not done but this remains a possibility.

## 2018-11-06 NOTE — Progress Notes (Signed)
   Subjective:   Patient ID: Kathleen Solis, female    DOB: Aug 24, 1965, 53 y.o.   MRN: [id number]  HPI The patient is a 53 y.o. female coming in for cold symptoms. Started about 3 days ago. Main symptoms are: headaches, body aches, sweaty last night, cough, chest pain with coughing, mild to low congestion. Denies SOB. Overall it is stable to mildly worsening. Has tried tylenol cold which has helped some. Denies taking any other otc medications.   Review of Systems  Constitutional: Positive for activity change and appetite change. Negative for chills, fatigue, fever and unexpected weight change.  HENT: Positive for congestion, postnasal drip, rhinorrhea and sinus pressure. Negative for ear discharge, ear pain, sinus pain, sneezing, sore throat, tinnitus, trouble swallowing and voice change.   Eyes: Negative.   Respiratory: Positive for cough. Negative for chest tightness, shortness of breath and wheezing.   Cardiovascular: Negative.   Gastrointestinal: Negative.   Musculoskeletal: Positive for myalgias.  Neurological: Positive for headaches.    Objective:  Physical Exam Constitutional:      Appearance: She is well-developed.  HENT:     Head: Normocephalic and atraumatic.     Comments: Oropharynx with redness and clear drainage, nose with swollen turbinates, TMs normal bilaterally.  Neck:     Musculoskeletal: Normal range of motion.     Thyroid: No thyromegaly.  Cardiovascular:     Rate and Rhythm: Normal rate and regular rhythm.  Pulmonary:     Effort: Pulmonary effort is normal. No respiratory distress.     Breath sounds: Normal breath sounds. No wheezing or rales.  Abdominal:     Palpations: Abdomen is soft.  Musculoskeletal:        General: Tenderness present.  Lymphadenopathy:     Cervical: No cervical adenopathy.  Skin:    General: Skin is warm and dry.  Neurological:     Mental Status: She is alert and oriented to person, place, and time.     Vitals:   11/06/18  1331  BP: 110/78  Pulse: 78  Temp: 98.3 F (36.8 C)  TempSrc: Oral  SpO2: 99%  Weight: 184 lb (83.5 kg)  Height: 5\' 5"  (1.651 m)    Assessment & Plan:

## 2018-11-06 NOTE — Patient Instructions (Signed)
We have sent in flonase to use 2 sprays in each nostril daily for the next 1-2 weeks.   We have sent in the cough medicine to use in the evening.   Stay at home and I would avoid others until feeling well for 48 hours.

## 2018-11-11 ENCOUNTER — Other Ambulatory Visit: Payer: Self-pay | Admitting: Internal Medicine

## 2018-11-12 ENCOUNTER — Ambulatory Visit: Payer: Medicare Other

## 2018-11-13 ENCOUNTER — Telehealth: Payer: Medicare Other | Admitting: Internal Medicine

## 2018-12-14 ENCOUNTER — Ambulatory Visit: Payer: Medicare Other

## 2019-01-15 ENCOUNTER — Other Ambulatory Visit: Payer: Medicare Other

## 2019-01-15 ENCOUNTER — Other Ambulatory Visit: Payer: Self-pay

## 2019-01-15 ENCOUNTER — Encounter: Payer: Self-pay | Admitting: Internal Medicine

## 2019-01-15 ENCOUNTER — Ambulatory Visit (INDEPENDENT_AMBULATORY_CARE_PROVIDER_SITE_OTHER): Payer: Medicare Other | Admitting: Internal Medicine

## 2019-01-15 VITALS — BP 118/72 | HR 66 | Temp 98.1°F | Resp 16 | Ht 65.0 in | Wt 175.0 lb

## 2019-01-15 DIAGNOSIS — N898 Other specified noninflammatory disorders of vagina: Secondary | ICD-10-CM | POA: Diagnosis not present

## 2019-01-15 DIAGNOSIS — B373 Candidiasis of vulva and vagina: Secondary | ICD-10-CM | POA: Diagnosis not present

## 2019-01-15 DIAGNOSIS — N3000 Acute cystitis without hematuria: Secondary | ICD-10-CM

## 2019-01-15 DIAGNOSIS — B3731 Acute candidiasis of vulva and vagina: Secondary | ICD-10-CM

## 2019-01-15 LAB — POCT URINALYSIS DIPSTICK
Blood, UA: NEGATIVE
Glucose, UA: NEGATIVE
Ketones, UA: NEGATIVE
Nitrite, UA: NEGATIVE
Protein, UA: POSITIVE — AB
Spec Grav, UA: 1.01 (ref 1.010–1.025)
Urobilinogen, UA: 1 E.U./dL
pH, UA: 7 (ref 5.0–8.0)

## 2019-01-15 MED ORDER — FLUCONAZOLE 150 MG PO TABS: 150.0000 mg | ORAL_TABLET | Freq: Once | ORAL | 0 refills | Status: AC

## 2019-01-15 MED ORDER — CEPHALEXIN 500 MG PO CAPS: 500.0000 mg | ORAL_CAPSULE | Freq: Two times a day (BID) | ORAL | 0 refills | Status: DC

## 2019-01-15 NOTE — Assessment & Plan Note (Signed)
Symptoms also c/w yeast infection Diflucan 150 mg today, repeat in 3 days if needed -- repeat after antibiotic if needed   Please call if there is no improvement in symptoms.

## 2019-01-15 NOTE — Progress Notes (Signed)
Subjective:    Patient ID: Kathleen Solis, female    DOB: 27-Dec-1965, 53 y.o.   MRN: [id number]  HPI The patient is here for an acute visit.  Her symptoms started three days ago.  She has vaginal discharge that is white with blood in it.  She has itching outside and inside her vagina.  Her labia are swollen and sore.  She has LLQ pain that radiates to the left mid-lower back pain, which started yesterday.  The pain is constant.  She has tried ES tylenol w/o improvement.     She denies dysuria, hematuria and increased urinary frequency.  She denies fever.     Medications and allergies reviewed with patient and updated if appropriate.  Patient Active Problem List   Diagnosis Date Noted  . Abdominal pain 09/12/2018  . LRTI (lower respiratory tract infection) 07/18/2018  . Lumbar radiculopathy 05/15/2018  . Lumbar back pain 05/10/2018  . B12 deficiency 02/23/2018  . Tingling 02/21/2018  . Onychomycosis 02/21/2018  . Left ovarian cyst 10/04/2017  . Nausea 06/07/2017  . Dark stools 06/07/2017  . Hyperlipidemia 02/14/2017  . Chest pain 02/14/2017  . Hyperglycemia 02/14/2017  . Tinnitus of right ear 11/16/2016  . Viral URI with cough 11/09/2016  . Constipation 09/13/2016  . Great toe pain, left 08/16/2016  . Allergic rhinitis 08/16/2016  . Carotid arterial disease (HCC) 04/10/2016  . Diverticulitis of colon without hemorrhage 02/25/2016  . Bunion of left foot 02/08/2016  . Bone spur of right foot 02/08/2016  . Leg neuralgia, right 02/08/2016  . Episodic tension type headache 01/12/2016  . Benign essential HTN 03/03/2015  . History of cerebrovascular accident with residual deficit 03/03/2015    Current Outpatient Medications on File Prior to Visit  Medication Sig Dispense Refill  . aspirin EC 81 MG tablet Take 81 mg by mouth at bedtime.     Marland Kitchen atorvastatin (LIPITOR) 10 MG tablet Take 1 tablet (10 mg total) by mouth at bedtime. 90 tablet 2  . hydrochlorothiazide  (HYDRODIURIL) 25 MG tablet Take 1 tablet by mouth once daily 90 tablet 0  . metoprolol succinate (TOPROL-XL) 100 MG 24 hr tablet Take 1 tablet (100 mg total) by mouth at bedtime. 90 tablet 1   No current facility-administered medications on file prior to visit.     Past Medical History:  Diagnosis Date  . Diverticulitis   . Hyperlipidemia    diet controlled - no med  . Hypertension   . Sciatica    right  . Seasonal allergies   . Stroke [facility]) 2000   several mini strokes, weakness on right arm and leg  . SVD (spontaneous vaginal delivery)    x 2    Past Surgical History:  Procedure Laterality Date  . ABDOMINAL HYSTERECTOMY     partial  . ANAL FISSURE REPAIR    . APPENDECTOMY    . CHOLECYSTECTOMY    . CYSTOSCOPY N/A 10/04/2017   Procedure: CYSTOSCOPY;  Surgeon: Gerald Leitz, MD;  Location: [facility];  Service: Gynecology;  Laterality: N/A;  . FOOT SURGERY Left    big toe bunion removed  . LAPAROSCOPIC BILATERAL SALPINGO OOPHERECTOMY Bilateral 10/04/2017   Procedure: LAPAROSCOPIC BILATERAL SALPINGO OOPHORECTOMY, PELVIC WASHINGS;  Surgeon: Gerald Leitz, MD;  Location: [facility];  Service: Gynecology;  Laterality: Bilateral;  . TUBAL LIGATION    . WISDOM TOOTH EXTRACTION      Social History   Socioeconomic History  . Marital status: Single    Spouse name: Not  on file  . Number of children: 2  . Years of education: Not on file  . Highest education level: Associate degree: academic program  Occupational History  . Occupation: disabled  Social Needs  . Financial resource strain: Not on file  . Food insecurity:    Worry: Not on file    Inability: Not on file  . Transportation needs:    Medical: Not on file    Non-medical: Not on file  Tobacco Use  . Smoking status: Never Smoker  . Smokeless tobacco: Never Used  Substance and Sexual Activity  . Alcohol use: No  . Drug use: No  . Sexual activity: Not Currently    Birth control/protection: Surgical  Lifestyle  . Physical  activity:    Days per week: Not on file    Minutes per session: Not on file  . Stress: Not on file  Relationships  . Social connections:    Talks on phone: Not on file    Gets together: Not on file    Attends religious service: Not on file    Active member of club or organization: Not on file    Attends meetings of clubs or organizations: Not on file    Relationship status: Not on file  Other Topics Concern  . Not on file  Social History Narrative   No regular exercise      Pt is right handed, she occasionally drinks tea, walks QOD. She lives with her 52 yr old son, he has mild cerebral palsy.    Family History  Problem Relation Age of Onset  . Breast cancer Sister   . Diabetes Sister   . Glaucoma Sister   . Cataracts Sister   . Hypertension Sister   . Anuerysm Mother   . Pancreatic cancer Father   . Retinal detachment Brother   . Cataracts Sister     Review of Systems  Constitutional: Negative for chills and fever.  Gastrointestinal: Positive for abdominal pain (LLQ). Negative for blood in stool (no melena), constipation, diarrhea and nausea.  Genitourinary: Positive for vaginal bleeding and vaginal discharge. Negative for difficulty urinating, dysuria, frequency and hematuria.  Musculoskeletal: Positive for back pain (left side).  Neurological: Negative for light-headedness and headaches.       Objective:   Vitals:   01/15/19 1356  BP: 118/72  Pulse: 66  Resp: 16  Temp: 98.1 F (36.7 C)  SpO2: 99%   BP Readings from Last 3 Encounters:  01/15/19 118/72  11/06/18 110/78  10/31/18 112/68   Wt Readings from Last 3 Encounters:  01/15/19 175 lb (79.4 kg)  11/06/18 184 lb (83.5 kg)  10/31/18 184 lb (83.5 kg)   Body mass index is 29.12 kg/m.   Physical Exam Constitutional:      General: She is not in acute distress.    Appearance: Normal appearance. She is not ill-appearing.  HENT:     Head: Normocephalic and atraumatic.  Abdominal:     Palpations:  Abdomen is soft.     Tenderness: There is abdominal tenderness (LLQ). There is left CVA tenderness. There is no right CVA tenderness, guarding or rebound.  Musculoskeletal:     Right lower leg: No edema.     Left lower leg: No edema.  Skin:    General: Skin is warm and dry.  Neurological:     Mental Status: She is alert.            Assessment & Plan:    See Problem  List for Assessment and Plan of chronic medical problems.

## 2019-01-15 NOTE — Assessment & Plan Note (Signed)
Urine dip consistent with UTI Will send urine for culture Take keflex as prescribed.   Take tylenol if needed.   Increase water intake.  Call if no improvement

## 2019-01-15 NOTE — Patient Instructions (Addendum)
Take the antibiotic as prescribed.  Take tylenol if needed.    Increase your water intake.   Call if no improvement   For the yeast infection take one diflucan today.  If you are still having symptoms you can take another pill in 3 days.  After you complete the antibiotic you can repeat the diflucan if needed.    Call with any questions.     Urinary Tract Infection, Adult A urinary tract infection (UTI) is an infection of any part of the urinary tract, which includes the kidneys, ureters, bladder, and urethra. These organs make, store, and get rid of urine in the body. UTI can be a bladder infection (cystitis) or kidney infection (pyelonephritis). What are the causes? This infection may be caused by fungi, viruses, or bacteria. Bacteria are the most common cause of UTIs. This condition can also be caused by repeated incomplete emptying of the bladder during urination. What increases the risk? This condition is more likely to develop if:  You ignore your need to urinate or hold urine for long periods of time.  You do not empty your bladder completely during urination.  You wipe back to front after urinating or having a bowel movement, if you are female.  You are uncircumcised, if you are female.  You are constipated.  You have a urinary catheter that stays in place (indwelling).  You have a weak defense (immune) system.  You have a medical condition that affects your bowels, kidneys, or bladder.  You have diabetes.  You take antibiotic medicines frequently or for long periods of time, and the antibiotics no longer work well against certain types of infections (antibiotic resistance).  You take medicines that irritate your urinary tract.  You are exposed to chemicals that irritate your urinary tract.  You are female.  What are the signs or symptoms? Symptoms of this condition include:  Fever.  Frequent urination or passing small amounts of urine frequently.  Needing to  urinate urgently.  Pain or burning with urination.  Urine that smells bad or unusual.  Cloudy urine.  Pain in the lower abdomen or back.  Trouble urinating.  Blood in the urine.  Vomiting or being less hungry than normal.  Diarrhea or abdominal pain.  Vaginal discharge, if you are female.  How is this diagnosed? This condition is diagnosed with a medical history and physical exam. You will also need to provide a urine sample to test your urine. Other tests may be done, including:  Blood tests.  Sexually transmitted disease (STD) testing.  If you have had more than one UTI, a cystoscopy or imaging studies may be done to determine the cause of the infections. How is this treated? Treatment for this condition often includes a combination of two or more of the following:  Antibiotic medicine.  Other medicines to treat less common causes of UTI.  Over-the-counter medicines to treat pain.  Drinking enough water to stay hydrated.  Follow these instructions at home:  Take over-the-counter and prescription medicines only as told by your health care provider.  If you were prescribed an antibiotic, take it as told by your health care provider. Do not stop taking the antibiotic even if you start to feel better.  Avoid alcohol, caffeine, tea, and carbonated beverages. They can irritate your bladder.  Drink enough fluid to keep your urine clear or pale yellow.  Keep all follow-up visits as told by your health care provider. This is important.  Make sure to: ?  Empty your bladder often and completely. Do not hold urine for long periods of time. ? Empty your bladder before and after sex. ? Wipe from front to back after a bowel movement if you are female. Use each tissue one time when you wipe. Contact a health care provider if:  You have back pain.  You have a fever.  You feel nauseous or vomit.  Your symptoms do not get better after 3 days.  Your symptoms go away and  then return. Get help right away if:  You have severe back pain or lower abdominal pain.  You are vomiting and cannot keep down any medicines or water. This information is not intended to replace advice given to you by your health care provider. Make sure you discuss any questions you have with your health care provider. Document Released: 05/18/2005 Document Revised: 01/20/2016 Document Reviewed: 06/29/2015 Elsevier Interactive Patient Education  [employer].

## 2019-01-17 LAB — URINE CULTURE
MICRO NUMBER:: 505328
SPECIMEN QUALITY:: ADEQUATE

## 2019-01-19 ENCOUNTER — Other Ambulatory Visit: Payer: Self-pay | Admitting: Internal Medicine

## 2019-01-24 ENCOUNTER — Ambulatory Visit: Payer: Medicare Other

## 2019-02-18 NOTE — Progress Notes (Signed)
Virtual Visit via Video Note  I connected with Kathleen Solis on 02/19/19 at  1:15 PM EDT by a video enabled telemedicine application and verified that I am speaking with the correct person using two identifiers.   I discussed the limitations of evaluation and management by telemedicine and the availability of in person appointments. The patient expressed understanding and agreed to proceed.  The patient is currently in the passenger seat of a car and I am in the office.    No referring provider.    History of Present Illness: She is here for follow up of her chronic medical conditions.   She is not exercising regularly.   She has no concerns and denies any changes since she was here last.   Hypertension: She is taking her medication daily. She is compliant with a low sodium diet.  She denies chest pain, palpitations, edema, shortness of breath and regular headaches. She does not monitor her blood pressure at home.    Hyperlipidemia: She is taking her medication daily. She is compliant with a low fat/cholesterol diet. She denies myalgias.   Hyperglycemia: She is compliant with a low sugar/carbohydrate diet.   Review of Systems  Constitutional: Negative for chills and fever.  HENT:       Rhinorrhea  Respiratory: Positive for cough (chronic, dry cough). Negative for shortness of breath and wheezing.   Cardiovascular: Negative for chest pain, palpitations and leg swelling.  Neurological: Negative for dizziness and headaches.     Social History   Socioeconomic History  . Marital status: Single    Spouse name: Not on file  . Number of children: 2  . Years of education: Not on file  . Highest education level: Associate degree: academic program  Occupational History  . Occupation: disabled  Social Needs  . Financial resource strain: Not on file  . Food insecurity    Worry: Not on file    Inability: Not on file  . Transportation needs    Medical: Not on file   Non-medical: Not on file  Tobacco Use  . Smoking status: Never Smoker  . Smokeless tobacco: Never Used  Substance and Sexual Activity  . Alcohol use: No  . Drug use: No  . Sexual activity: Not Currently    Birth control/protection: Surgical  Lifestyle  . Physical activity    Days per week: Not on file    Minutes per session: Not on file  . Stress: Not on file  Relationships  . Social Herbalist on phone: Not on file    Gets together: Not on file    Attends religious service: Not on file    Active member of club or organization: Not on file    Attends meetings of clubs or organizations: Not on file    Relationship status: Not on file  Other Topics Concern  . Not on file  Social History Narrative   No regular exercise      Pt is right handed, she occasionally drinks tea, walks QOD. She lives with her 52 yr old son, he has mild cerebral palsy.     Observations/Objective: Appears well in NAD Normal mood and affect.  Assessment and Plan:  See Problem List for Assessment and Plan of chronic medical problems.   Follow Up Instructions:    I discussed the assessment and treatment plan with the patient. The patient was provided an opportunity to ask questions and all were answered. The patient agreed with the plan  and demonstrated an understanding of the instructions.   The patient was advised to call back or seek an in-person evaluation if the symptoms worsen or if the condition fails to improve as anticipated.  Fu in 6 months with blood work then  Pincus SanesStacy J Maalle Starrett, MD

## 2019-02-19 ENCOUNTER — Ambulatory Visit (INDEPENDENT_AMBULATORY_CARE_PROVIDER_SITE_OTHER): Payer: Medicare Other | Admitting: Internal Medicine

## 2019-02-19 ENCOUNTER — Encounter: Payer: Self-pay | Admitting: Internal Medicine

## 2019-02-19 DIAGNOSIS — E785 Hyperlipidemia, unspecified: Secondary | ICD-10-CM

## 2019-02-19 DIAGNOSIS — I10 Essential (primary) hypertension: Secondary | ICD-10-CM

## 2019-02-19 DIAGNOSIS — R739 Hyperglycemia, unspecified: Secondary | ICD-10-CM | POA: Diagnosis not present

## 2019-02-19 DIAGNOSIS — E7800 Pure hypercholesterolemia, unspecified: Secondary | ICD-10-CM

## 2019-02-19 DIAGNOSIS — I6930 Unspecified sequelae of cerebral infarction: Secondary | ICD-10-CM

## 2019-02-19 MED ORDER — METOPROLOL SUCCINATE ER 100 MG PO TB24: 100.0000 mg | ORAL_TABLET | Freq: Every day | ORAL | 1 refills | Status: DC

## 2019-02-19 MED ORDER — HYDROCHLOROTHIAZIDE 25 MG PO TABS: ORAL_TABLET | ORAL | 1 refills | Status: DC

## 2019-02-19 MED ORDER — ATORVASTATIN CALCIUM 10 MG PO TABS: 10.0000 mg | ORAL_TABLET | Freq: Every day | ORAL | 1 refills | Status: DC

## 2019-02-19 NOTE — Assessment & Plan Note (Signed)
She states she is compliant with a low sugar/carbohydrate diet Not currently exercising regularly We will check A1c at her next visit

## 2019-02-19 NOTE — Assessment & Plan Note (Signed)
History of CVA Continue aspirin 81 mg daily, statin and current blood pressure medications Blood pressure has been well controlled and cholesterol has been at goal Recheck blood work at her next visit

## 2019-02-19 NOTE — Assessment & Plan Note (Signed)
Last lipid panel controlled-LDL was at goal of less than 70 Continue statin at current dose We will recheck cholesterol at her next visit

## 2019-02-19 NOTE — Assessment & Plan Note (Signed)
She does not monitor her blood pressure at home BP Readings from Last 3 Encounters:  01/15/19 118/72  11/06/18 110/78  10/31/18 112/68   BP well controlled Current regimen effective and well tolerated Continue current medications at current doses

## 2019-02-27 ENCOUNTER — Encounter: Payer: Self-pay | Admitting: Internal Medicine

## 2019-02-27 ENCOUNTER — Telehealth: Payer: Self-pay

## 2019-02-27 ENCOUNTER — Ambulatory Visit (INDEPENDENT_AMBULATORY_CARE_PROVIDER_SITE_OTHER): Payer: Medicare Other | Admitting: Internal Medicine

## 2019-02-27 DIAGNOSIS — R6889 Other general symptoms and signs: Secondary | ICD-10-CM

## 2019-02-27 DIAGNOSIS — Z20822 Contact with and (suspected) exposure to covid-19: Secondary | ICD-10-CM

## 2019-02-27 MED ORDER — PROMETHAZINE-DM 6.25-15 MG/5ML PO SYRP: 5.0000 mL | ORAL_SOLUTION | Freq: Two times a day (BID) | ORAL | 0 refills | Status: DC | PRN

## 2019-02-27 NOTE — Telephone Encounter (Signed)
Patient called to schedule covid test, she verbalized understanding. Appointment scheduled for tomorrow, 02/28/19 at [place] [facility], advised of location and to wear a mask for everyone in the vehicle, she verbalized understanding.   Raford Pitcher R, CMA 8 hours ago (8:47 AM)     Patient is having couch, chest tightness, body aches, and Dr. Sharlet Salina would like for her to be tested. Thank you       Documentation

## 2019-02-27 NOTE — Telephone Encounter (Signed)
Patient is having couch, chest tightness, body aches, and Dr. Sharlet Salina would like for her to be tested. Thank you

## 2019-02-27 NOTE — Assessment & Plan Note (Signed)
Getting covid-19 testing for her. Advised to quarantine until results return. Rx for promethazine/dm cough syrup which she has before without problems. Advised tylenol and other supportive care.

## 2019-02-27 NOTE — Progress Notes (Signed)
Virtual Visit via Video Note  I connected with Pryor Ochoa on 02/27/19 at  8:40 AM EDT by a video enabled telemedicine application and verified that I am speaking with the correct person using two identifiers.  The patient and the provider were at separate locations throughout the entire encounter.   I discussed the limitations of evaluation and management by telemedicine and the availability of in person appointments. The patient expressed understanding and agreed to proceed.  History of Present Illness: The patient is a 53 y.o. female with visit for female. Started yesterday. Has body aches and sore throat and coughing a lot. No fevers. Denies chills. Feels tight in her chest when she is inhaling. Had some headache yesterday. Overall it is worsening. Has tried tylenol and benadryl which have not helped much. She denies known sick contacts. She does not work and denies being around [employer].  Observations/Objective: Appearance: normal, breathing appears normal, some coughing during visit, casual grooming, abdomen does not appear distended, throat not well visualized, mental status is A and O times 3  Assessment and Plan: See problem oriented charting  Follow Up Instructions: covid-19 testing, rx for promethazine/dm cough syrup  Visit time 25 minutes: greater than 50% of that time was spent in face to face counseling and coordination of care with the patient: counseled about need to quarantine, possible symptoms, likely course, where to seek care if health worsens or if SOB  I discussed the assessment and treatment plan with the patient. The patient was provided an opportunity to ask questions and all were answered. The patient agreed with the plan and demonstrated an understanding of the instructions.   The patient was advised to call back or seek an in-person evaluation if the symptoms worsen or if the condition fails to improve as anticipated.  Hoyt Koch, MD

## 2019-02-28 ENCOUNTER — Other Ambulatory Visit: Payer: Medicare Other

## 2019-02-28 DIAGNOSIS — Z20822 Contact with and (suspected) exposure to covid-19: Secondary | ICD-10-CM

## 2019-03-04 LAB — NOVEL CORONAVIRUS, NAA: SARS-CoV-2, NAA: NOT DETECTED

## 2019-03-12 ENCOUNTER — Other Ambulatory Visit: Payer: Self-pay

## 2019-03-12 ENCOUNTER — Ambulatory Visit (INDEPENDENT_AMBULATORY_CARE_PROVIDER_SITE_OTHER): Payer: Medicare Other | Admitting: Internal Medicine

## 2019-03-12 DIAGNOSIS — R739 Hyperglycemia, unspecified: Secondary | ICD-10-CM | POA: Diagnosis not present

## 2019-03-12 DIAGNOSIS — Z20828 Contact with and (suspected) exposure to other viral communicable diseases: Secondary | ICD-10-CM

## 2019-03-12 DIAGNOSIS — Z20822 Contact with and (suspected) exposure to covid-19: Secondary | ICD-10-CM

## 2019-03-12 DIAGNOSIS — R05 Cough: Secondary | ICD-10-CM | POA: Diagnosis not present

## 2019-03-12 DIAGNOSIS — R059 Cough, unspecified: Secondary | ICD-10-CM

## 2019-03-12 MED ORDER — AZITHROMYCIN 250 MG PO TABS: ORAL_TABLET | ORAL | 1 refills | Status: DC

## 2019-03-12 MED ORDER — PROMETHAZINE-CODEINE 6.25-10 MG/5ML PO SYRP: 5.0000 mL | ORAL_SOLUTION | Freq: Four times a day (QID) | ORAL | 0 refills | Status: DC | PRN

## 2019-03-12 NOTE — Progress Notes (Signed)
Patient ID: Kathleen Solis, female   DOB: 02-27-1966, 53 y.o.   MRN: [id number]  Virtual Visit via Video Note  I connected with Kathleen Solis on 03/12/19 at 11:20 AM EDT by a video enabled telemedicine application and verified that I am speaking with the correct person using two identifiers.  Location: Patient: at home Provider: at office   I discussed the limitations of evaluation and management by telemedicine and the availability of in person appointments. The patient expressed understanding and agreed to proceed.  History of Present Illness: Here with acute onset mild to mod 2-3 days ST, HA, general weakness and malaise, with prod cough greenish sputum and fleeting chest pains, but Pt denies increased sob or doe, wheezing, orthopnea, PND, increased LE swelling, palpitations, dizziness or syncope.No known COVID exposures, but symptoms started 2 days after attended a wedding with > 50 persons present.  Pt denies new neurological symptoms such as new headache, or facial or extremity weakness or numbness   Pt denies polydipsia, polyuria Past Medical History:  Diagnosis Date  . Diverticulitis   . Hyperlipidemia    diet controlled - no med  . Hypertension   . Sciatica    right  . Seasonal allergies   . Stroke [facility]) 2000   several mini strokes, weakness on right arm and leg  . SVD (spontaneous vaginal delivery)    x 2   Past Surgical History:  Procedure Laterality Date  . ABDOMINAL HYSTERECTOMY     partial  . ANAL FISSURE REPAIR    . APPENDECTOMY    . CHOLECYSTECTOMY    . CYSTOSCOPY N/A 10/04/2017   Procedure: CYSTOSCOPY;  Surgeon: Gerald Leitzole, Tara, MD;  Location: [facility];  Service: Gynecology;  Laterality: N/A;  . FOOT SURGERY Left    big toe bunion removed  . LAPAROSCOPIC BILATERAL SALPINGO OOPHERECTOMY Bilateral 10/04/2017   Procedure: LAPAROSCOPIC BILATERAL SALPINGO OOPHORECTOMY, PELVIC WASHINGS;  Surgeon: Gerald Leitzole, Tara, MD;  Location: [facility];  Service: Gynecology;  Laterality:  Bilateral;  . TUBAL LIGATION    . WISDOM TOOTH EXTRACTION      reports that she has never smoked. She has never used smokeless tobacco. She reports that she does not drink alcohol or use drugs. family history includes Anuerysm in her mother; Breast cancer in her sister; Cataracts in her sister and sister; Diabetes in her sister; Glaucoma in her sister; Hypertension in her sister; Pancreatic cancer in her father; Retinal detachment in her brother. Allergies  Allergen Reactions  . Darvon [Propoxyphene] Other (See Comments)    Gi problems  . Morphine And Related Itching  . Sulfa Antibiotics Hives  . Tussionex Pennkinetic Er [Hydrocod Polst-Cpm Polst Er]     Nausea, vomiting  . Buprenorphine Hcl Itching  . Prochlorperazine Edisylate Itching and Other (See Comments)    "Acts weird: Hallucinations"   Current Outpatient Medications on File Prior to Visit  Medication Sig Dispense Refill  . aspirin EC 81 MG tablet Take 81 mg by mouth at bedtime.     Marland Kitchen. atorvastatin (LIPITOR) 10 MG tablet Take 1 tablet (10 mg total) by mouth at bedtime. 90 tablet 1  . hydrochlorothiazide (HYDRODIURIL) 25 MG tablet Take 1 tablet by mouth once daily. . 90 tablet 1  . metoprolol succinate (TOPROL-XL) 100 MG 24 hr tablet Take 1 tablet (100 mg total) by mouth at bedtime. 90 tablet 1  . promethazine-dextromethorphan (PROMETHAZINE-DM) 6.25-15 MG/5ML syrup Take 5 mLs by mouth 2 (two) times daily as needed for cough. 118 mL 0  No current facility-administered medications on file prior to visit.     Observations/Objective: Alert, NAD, mild ill, appropriate mood and affect, resps normal, cn 2-12 intact, moves all 4s, no visible rash or swelling Lab Results  Component Value Date   WBC 8.0 09/12/2018   HGB 14.9 09/12/2018   HCT 42.6 09/12/2018   PLT 238.0 09/12/2018   GLUCOSE 100 (H) 09/12/2018   CHOL 138 02/21/2018   TRIG 79.0 02/21/2018   HDL 54.30 02/21/2018   LDLCALC 68 02/21/2018   ALT 12 09/12/2018   AST 17  09/12/2018   NA 139 09/12/2018   K 4.5 09/12/2018   CL 98 09/12/2018   CREATININE 0.92 09/12/2018   BUN 13 09/12/2018   CO2 33 (H) 09/12/2018   TSH 1.80 02/21/2018   INR 1.18 11/24/2015   HGBA1C 4.8 02/21/2018   Assessment and Plan: See notes  Follow Up Instructions: See notes   I discussed the assessment and treatment plan with the patient. The patient was provided an opportunity to ask questions and all were answered. The patient agreed with the plan and demonstrated an understanding of the instructions.   The patient was advised to call back or seek an in-person evaluation if the symptoms worsen or if the condition fails to improve as anticipated.  Cathlean Cower, MD

## 2019-03-12 NOTE — Patient Instructions (Signed)
Please take all new medication as prescribed- the antibiotic, and cough medicine  Please go to the [facility] for COVID testing  Please continue all other medications as before, and refills have been done if requested.  Please have the pharmacy call with any other refills you may need.  Please keep your appointments with your specialists as you may have planned

## 2019-03-15 LAB — NOVEL CORONAVIRUS, NAA: SARS-CoV-2, NAA: NOT DETECTED

## 2019-03-17 ENCOUNTER — Encounter: Payer: Self-pay | Admitting: Internal Medicine

## 2019-03-17 DIAGNOSIS — R05 Cough: Secondary | ICD-10-CM | POA: Insufficient documentation

## 2019-03-17 DIAGNOSIS — R053 Chronic cough: Secondary | ICD-10-CM | POA: Insufficient documentation

## 2019-03-17 DIAGNOSIS — Z20828 Contact with and (suspected) exposure to other viral communicable diseases: Secondary | ICD-10-CM | POA: Insufficient documentation

## 2019-03-17 DIAGNOSIS — R059 Cough, unspecified: Secondary | ICD-10-CM | POA: Insufficient documentation

## 2019-03-17 DIAGNOSIS — Z20822 Contact with and (suspected) exposure to covid-19: Secondary | ICD-10-CM | POA: Insufficient documentation

## 2019-03-17 HISTORY — DX: Chronic cough: R05.3

## 2019-03-17 NOTE — Assessment & Plan Note (Signed)
Mild to mod, c/w bronchitis vs pna, for antibx course,  Cough medicine prn, to f/u any worsening symptoms or concerns,

## 2019-03-17 NOTE — Assessment & Plan Note (Signed)
for COVID testing referral as well given recent possible exposure

## 2019-03-17 NOTE — Assessment & Plan Note (Signed)
stable overall by history and exam, recent data reviewed with pt, and pt to continue medical treatment as before,  to f/u any worsening symptoms or concerns  

## 2019-03-18 ENCOUNTER — Telehealth: Payer: Self-pay | Admitting: Internal Medicine

## 2019-03-18 ENCOUNTER — Telehealth: Payer: Self-pay

## 2019-03-18 NOTE — Telephone Encounter (Signed)
-----   Message from Biagio Borg, MD sent at 03/16/2019  8:16 PM EDT ----- Left message on MyChart, pt to cont same tx    Sihaam Chrobak to please inform pt, COVID neg

## 2019-03-18 NOTE — Telephone Encounter (Signed)
Called pt, LVM.   CRM created.  

## 2019-03-18 NOTE — Telephone Encounter (Signed)
Patient advised of negative COVID test. Patient expressed understanding.

## 2019-03-20 ENCOUNTER — Other Ambulatory Visit: Payer: Self-pay | Admitting: Internal Medicine

## 2019-03-20 MED ORDER — PROMETHAZINE-CODEINE 6.25-10 MG/5ML PO SYRP: 5.0000 mL | ORAL_SOLUTION | Freq: Four times a day (QID) | ORAL | 0 refills | Status: DC | PRN

## 2019-03-20 NOTE — Telephone Encounter (Signed)
Sent to pharmacy 

## 2019-03-20 NOTE — Telephone Encounter (Signed)
promethazine-codeine (PHENERGAN WITH CODEINE) 6.25-10 MG/5ML syrup   Patient states she was given 140 ml per insurance, as they would not cover 180. Patient states that she is almost out of this medication and inquired if the remainder could be sent into pharmacy as she is still experiencing cough. Patient is requesting a call back from CMA to discuss further.

## 2019-03-20 NOTE — Telephone Encounter (Signed)
Was seen by Sharlet Salina on 02/27/19. Please advise.

## 2019-03-21 NOTE — Telephone Encounter (Signed)
Schedule a virtual.  She has had 2 visits in the past month-both sound like different illnesses and should be reevaluated.

## 2019-03-21 NOTE — Telephone Encounter (Signed)
Contacted patient and scheduled VV for tomorrow at 8:30 am with Dr. Quay Burow.

## 2019-03-21 NOTE — Telephone Encounter (Signed)
Pt was seen by Dr. Jenny Reichmann on 7/21. States she was given azithromycin which has not helped. She still has cough, headache, and sweats. Wants to know if another antibiotic is necessary. Do not know if you would like to evaluate her or not.

## 2019-03-21 NOTE — Progress Notes (Signed)
Virtual Visit via Video Note  I connected with Kathleen Solis on 03/22/19 at  8:30 AM EDT by a video enabled telemedicine application and verified that I am speaking with the correct person using two identifiers.   I discussed the limitations of evaluation and management by telemedicine and the availability of in person appointments. The patient expressed understanding and agreed to proceed.  The patient is currently at home and I am in the office.    No referring provider.    History of Present Illness: This is an acute visit for cold symptoms  She was seen twice this month for cold symptoms.  After her last visit 7/21 she received a zpak and it has not helped.  She is taking cough syrup.  She was tested for COVID x 2 and both were negative.    Her symptoms started more than 10 days ago.  She is coughing bad.  She has chest pain from coughing.  She has headaches, sore throat, nasal congestion, feels sweaty and has had some nausea.  She has had a little bit of dizziness.  She denies any known fever.  She was tested for COVID just after the symptoms started and it was negative.  She taking tylenol, cough syrup, and a Z-Pak  Review of Systems  Constitutional: Positive for diaphoresis. Negative for fever.  HENT: Positive for congestion and sore throat. Negative for ear pain and sinus pain.        PND  Respiratory: Positive for cough (not productive). Negative for shortness of breath and wheezing.   Cardiovascular: Positive for chest pain (from cough).  Gastrointestinal: Positive for nausea. Negative for diarrhea.  Musculoskeletal: Negative for myalgias.  Neurological: Positive for dizziness (a little) and headaches.       Social History   Socioeconomic History  . Marital status: Single    Spouse name: Not on file  . Number of children: 2  . Years of education: Not on file  . Highest education level: Associate degree: academic program  Occupational History  . Occupation:  disabled  Social Needs  . Financial resource strain: Not on file  . Food insecurity    Worry: Not on file    Inability: Not on file  . Transportation needs    Medical: Not on file    Non-medical: Not on file  Tobacco Use  . Smoking status: Never Smoker  . Smokeless tobacco: Never Used  Substance and Sexual Activity  . Alcohol use: No  . Drug use: No  . Sexual activity: Not Currently    Birth control/protection: Surgical  Lifestyle  . Physical activity    Days per week: Not on file    Minutes per session: Not on file  . Stress: Not on file  Relationships  . Social Herbalist on phone: Not on file    Gets together: Not on file    Attends religious service: Not on file    Active member of club or organization: Not on file    Attends meetings of clubs or organizations: Not on file    Relationship status: Not on file  Other Topics Concern  . Not on file  Social History Narrative   No regular exercise      Pt is right handed, she occasionally drinks tea, walks QOD. She lives with her 30 yr old son, he has mild cerebral palsy.     Observations/Objective: Appears well in NAD Coughing intermittently through visit, hoarse voice, breathing normally  Assessment and Plan:  See Problem List for Assessment and Plan of chronic medical problems.   Follow Up Instructions:    I discussed the assessment and treatment plan with the patient. The patient was provided an opportunity to ask questions and all were answered. The patient agreed with the plan and demonstrated an understanding of the instructions.   The patient was advised to call back or seek an in-person evaluation if the symptoms worsen or if the condition fails to improve as anticipated.    Binnie Rail, MD

## 2019-03-22 ENCOUNTER — Encounter: Payer: Self-pay | Admitting: Internal Medicine

## 2019-03-22 ENCOUNTER — Ambulatory Visit (INDEPENDENT_AMBULATORY_CARE_PROVIDER_SITE_OTHER): Payer: Medicare Other | Admitting: Internal Medicine

## 2019-03-22 DIAGNOSIS — J329 Chronic sinusitis, unspecified: Secondary | ICD-10-CM | POA: Insufficient documentation

## 2019-03-22 DIAGNOSIS — J019 Acute sinusitis, unspecified: Secondary | ICD-10-CM

## 2019-03-22 HISTORY — DX: Chronic sinusitis, unspecified: J32.9

## 2019-03-22 MED ORDER — DOXYCYCLINE HYCLATE 100 MG PO TABS: 100.0000 mg | ORAL_TABLET | Freq: Two times a day (BID) | ORAL | 0 refills | Status: DC

## 2019-03-22 NOTE — Assessment & Plan Note (Signed)
Likely sinus infection ongoing for about 2 weeks Likely bacterial at this point since it is not improving Z-Pak not effective Start doxycycline Prescription cough syrup, which she has at home otc cold medications Rest, fluid Call if no improvement

## 2019-03-29 ENCOUNTER — Other Ambulatory Visit: Payer: Self-pay | Admitting: *Deleted

## 2019-03-29 ENCOUNTER — Telehealth: Payer: Self-pay | Admitting: Internal Medicine

## 2019-03-29 DIAGNOSIS — B349 Viral infection, unspecified: Secondary | ICD-10-CM

## 2019-03-29 DIAGNOSIS — Z20822 Contact with and (suspected) exposure to covid-19: Secondary | ICD-10-CM

## 2019-03-29 MED ORDER — PROMETHAZINE-CODEINE 6.25-10 MG/5ML PO SYRP: 5.0000 mL | ORAL_SOLUTION | Freq: Four times a day (QID) | ORAL | 0 refills | Status: AC | PRN

## 2019-03-29 NOTE — Telephone Encounter (Signed)
Yes, ok.  If negative. Can consider pulmonary referral or trial of steroids or inhaler.   Test ordered

## 2019-03-29 NOTE — Addendum Note (Signed)
Addended by: Binnie Rail on: 03/29/2019 02:17 PM   Modules accepted: Orders

## 2019-03-29 NOTE — Telephone Encounter (Signed)
Pt aware.

## 2019-03-29 NOTE — Telephone Encounter (Signed)
I will renew one last time.

## 2019-03-29 NOTE — Telephone Encounter (Signed)
Pt called back with medication name  promethazine-codeine (PHENERGAN WITH CODEINE) 6.25-10 MG/5ML syrup [[id number]]

## 2019-03-29 NOTE — Telephone Encounter (Signed)
Pt aware. Wants to know if she can get tested for COVID again due to lingering cough after the 2 rounds of antibiotics she has been on. Please advise.

## 2019-03-29 NOTE — Telephone Encounter (Signed)
Was filled on 7/29 please advise

## 2019-03-29 NOTE — Telephone Encounter (Signed)
Pt called and is requesting a refill on her cough medicine. Please advise.   [facility] [address], [place]  [place] [address]  Phone: [phone] Fax: [phone]  Not a 24 hour pharmacy; exact hours not known.

## 2019-03-30 ENCOUNTER — Encounter: Payer: Self-pay | Admitting: Internal Medicine

## 2019-03-30 LAB — NOVEL CORONAVIRUS, NAA: SARS-CoV-2, NAA: NOT DETECTED

## 2019-04-08 ENCOUNTER — Ambulatory Visit (INDEPENDENT_AMBULATORY_CARE_PROVIDER_SITE_OTHER): Payer: Medicare Other | Admitting: Internal Medicine

## 2019-04-08 ENCOUNTER — Encounter: Payer: Self-pay | Admitting: Internal Medicine

## 2019-04-08 DIAGNOSIS — R05 Cough: Secondary | ICD-10-CM | POA: Diagnosis not present

## 2019-04-08 DIAGNOSIS — R059 Cough, unspecified: Secondary | ICD-10-CM

## 2019-04-08 MED ORDER — ALBUTEROL SULFATE HFA 108 (90 BASE) MCG/ACT IN AERS: 2.0000 | INHALATION_SPRAY | Freq: Four times a day (QID) | RESPIRATORY_TRACT | 1 refills | Status: DC | PRN

## 2019-04-08 MED ORDER — PROMETHAZINE-DM 6.25-15 MG/5ML PO SYRP: 5.0000 mL | ORAL_SOLUTION | Freq: Four times a day (QID) | ORAL | 0 refills | Status: DC | PRN

## 2019-04-08 MED ORDER — BENZONATATE 100 MG PO CAPS: 100.0000 mg | ORAL_CAPSULE | Freq: Two times a day (BID) | ORAL | 0 refills | Status: DC | PRN

## 2019-04-08 NOTE — Assessment & Plan Note (Signed)
Symptoms consistent with allergies or URI She is already had 2 antibiotics and they have not helped and I do not think this is bacterial She has had COVID test x2, both of which have been negative She has had multiple refills of cough syrup and is currently taking Tylenol  ?  Allergic rhinitis, atypical asthma Unable to take prednisone Will try an albuterol inhaler We will give prescription for Tessalon Perles and 1 more prescription for prescription cough syrup-promethazine-DM Will refer to pulmonary for their input

## 2019-04-08 NOTE — Progress Notes (Signed)
Virtual Visit via Video Note  I connected with Kathleen Solis on 04/08/19 at  1:45 PM EDT by a video enabled telemedicine application and verified that I am speaking with the correct person using two identifiers.   I discussed the limitations of evaluation and management by telemedicine and the availability of in person appointments. The patient expressed understanding and agreed to proceed.  The patient is currently at home and I am in the office.    No referring provider.    History of Present Illness: This is an acute visit for cough.  She has been coughing over the past month.  Her cough is the same.  She has had 2- COVID tests.  She states a dry cough, intermittent sore throat, chest pain which is discomfort from coughing, mild body aches.  She denies any fever, chills, nasal congestion, ear pain, sinus pain, shortness of breath, wheezing or headaches.  She has taking a Z-Pak, doxycycline x10 days without improvement.  She is taking Tylenol now, but does not think it helps much.  She has taken cough syrup, which does help, but she has needed multiple refills.  She is tried taking Breo daily and that has not helped.     Review of Systems  Constitutional: Negative for chills and fever.  HENT: Positive for sore throat (intermittent). Negative for congestion, ear pain and sinus pain.        PND, runny nose  Respiratory: Positive for cough (dry). Negative for sputum production, shortness of breath and wheezing.   Cardiovascular: Positive for chest pain (discomfort from coughing).  Musculoskeletal: Positive for myalgias (mild).  Neurological: Negative for headaches.      Social History   Socioeconomic History  . Marital status: Single    Spouse name: Not on file  . Number of children: 2  . Years of education: Not on file  . Highest education level: Associate degree: academic program  Occupational History  . Occupation: disabled  Social Needs  . Financial resource strain:  Not on file  . Food insecurity    Worry: Not on file    Inability: Not on file  . Transportation needs    Medical: Not on file    Non-medical: Not on file  Tobacco Use  . Smoking status: Never Smoker  . Smokeless tobacco: Never Used  Substance and Sexual Activity  . Alcohol use: No  . Drug use: No  . Sexual activity: Not Currently    Birth control/protection: Surgical  Lifestyle  . Physical activity    Days per week: Not on file    Minutes per session: Not on file  . Stress: Not on file  Relationships  . Social Herbalist on phone: Not on file    Gets together: Not on file    Attends religious service: Not on file    Active member of club or organization: Not on file    Attends meetings of clubs or organizations: Not on file    Relationship status: Not on file  Other Topics Concern  . Not on file  Social History Narrative   No regular exercise      Pt is right handed, she occasionally drinks tea, walks QOD. She lives with her 28 yr old son, he has mild cerebral palsy.     Observations/Objective: Appears well in NAD Breathing normally  Assessment and Plan:  See Problem List for Assessment and Plan of chronic medical problems.   Follow Up Instructions:  I discussed the assessment and treatment plan with the patient. The patient was provided an opportunity to ask questions and all were answered. The patient agreed with the plan and demonstrated an understanding of the instructions.   The patient was advised to call back or seek an in-person evaluation if the symptoms worsen or if the condition fails to improve as anticipated.    Binnie Rail, MD

## 2019-04-11 ENCOUNTER — Ambulatory Visit: Payer: Self-pay | Admitting: Internal Medicine

## 2019-04-17 ENCOUNTER — Other Ambulatory Visit: Payer: Self-pay | Admitting: Internal Medicine

## 2019-04-17 NOTE — Telephone Encounter (Signed)
Routing to CMA 

## 2019-04-17 NOTE — Telephone Encounter (Signed)
Requested medication (s) are due for refill today: no  Requested medication (s) are on the active medication list: yes  Last refill: 04/08/2019  Future visit scheduled: no  Notes to clinic:  Review for refill   Requested Prescriptions  Pending Prescriptions Disp Refills   promethazine-dextromethorphan (PROMETHAZINE-DM) 6.25-15 MG/5ML syrup 150 mL 0    Sig: Take 5 mLs by mouth 4 (four) times daily as needed for cough.     Ear, Nose, and Throat:  Antitussives/Expectorants Passed - 04/17/2019 11:57 AM      Passed - Valid encounter within last 12 months    Recent Outpatient Visits          1 week ago Cough   [place], MD   3 weeks ago Acute non-recurrent sinusitis, unspecified location   [place], MD   1 month ago Exposure to [facility] -[facility]ie Bame, Hunt Oris, MD   1 month ago Suspected 2019 Novel Coronavirus Infection   [facility] -Chuck Hint, MD   1 month ago Benign essential HTN   [place], Claudina Lick, MD

## 2019-04-17 NOTE — Telephone Encounter (Signed)
Copied from [place] [id number]. Topic: Quick Communication - Rx Refill/Question >> Apr 17, 2019 11:24 AM Rainey Pines A wrote: Medication: promethazine-dextromethorphan (PROMETHAZINE-DM) 6.25-15 MG/5ML syrup (Patient feels that this medication helps her cough best.)  Has the patient contacted their pharmacy? {Yes (Agent: If no, request that the patient contact the pharmacy for the refill.) (Agent: If yes, when and what did the pharmacy advise?Contact PCP  Preferred Pharmacy (with phone number or street name): [facility] [address], [place] [phone] (Phone) [phone] (Fax)    Agent: Please be advised that RX refills may take up to 3 business days. We ask that you follow-up with your pharmacy.

## 2019-04-19 ENCOUNTER — Telehealth: Payer: Self-pay

## 2019-04-19 NOTE — Telephone Encounter (Signed)
She has had multiple refills of the cough syrup and I would advise using the Tessalon Perles only. [Can refill if she needs] she sees pulmonary 9/1 for further evaluation.

## 2019-04-19 NOTE — Telephone Encounter (Signed)
Patient calling back to check status of request. Advised patient of message below. Patient verbalized understanding and stated that she will wait until appointment with pulmonary. Patient declined refill of tessalon perles at this time.

## 2019-04-19 NOTE — Telephone Encounter (Signed)
Copied from CRM #[id number]. Topic: Quick Communication - Rx Refill/Question °>> Apr 17, 2019 11:24 AM [place], Nate A wrote: °Medication: promethazine-dextromethorphan (PROMETHAZINE-DM) 6.25-15 MG/5ML syrup (Patient feels that this medication helps her cough best.) ° °Has the patient contacted their pharmacy? {Yes °(Agent: If no, request that the patient contact the pharmacy for the refill.) °(Agent: If yes, when and what did the pharmacy advise?Contact PCP ° °Preferred Pharmacy (with phone number or street name): [facility] [address] [place],  - [address] [phone] (Phone) °[phone] (Fax) ° ° ° °Agent: Please be advised that RX refills may take up to 3 business days. We ask that you follow-up with your pharmacy. °

## 2019-04-23 ENCOUNTER — Other Ambulatory Visit: Payer: Self-pay

## 2019-04-23 ENCOUNTER — Encounter: Payer: Self-pay | Admitting: Emergency Medicine

## 2019-04-23 ENCOUNTER — Ambulatory Visit (INDEPENDENT_AMBULATORY_CARE_PROVIDER_SITE_OTHER): Payer: Medicare Other | Admitting: Emergency Medicine

## 2019-04-23 ENCOUNTER — Ambulatory Visit (INDEPENDENT_AMBULATORY_CARE_PROVIDER_SITE_OTHER): Payer: Medicare Other

## 2019-04-23 ENCOUNTER — Institutional Professional Consult (permissible substitution): Payer: Medicare Other | Admitting: Emergency Medicine

## 2019-04-23 ENCOUNTER — Telehealth: Payer: Self-pay | Admitting: Emergency Medicine

## 2019-04-23 DIAGNOSIS — R059 Cough, unspecified: Secondary | ICD-10-CM

## 2019-04-23 DIAGNOSIS — R05 Cough: Secondary | ICD-10-CM

## 2019-04-23 MED ORDER — PANTOPRAZOLE SODIUM 40 MG PO TBEC: 40.0000 mg | DELAYED_RELEASE_TABLET | Freq: Every day | ORAL | 5 refills | Status: DC

## 2019-04-23 MED ORDER — HYDROCODONE-HOMATROPINE 5-1.5 MG/5ML PO SYRP: 5.0000 mL | ORAL_SOLUTION | Freq: Four times a day (QID) | ORAL | 0 refills | Status: DC | PRN

## 2019-04-23 MED ORDER — BENZONATATE 200 MG PO CAPS: 200.0000 mg | ORAL_CAPSULE | Freq: Three times a day (TID) | ORAL | 1 refills | Status: DC | PRN

## 2019-04-23 MED ORDER — PROMETHAZINE-CODEINE 6.25-10 MG/5ML PO SYRP: 5.0000 mL | ORAL_SOLUTION | Freq: Four times a day (QID) | ORAL | 0 refills | Status: DC | PRN

## 2019-04-23 NOTE — Telephone Encounter (Signed)
Call made to pharmacy, confirmed receipt.   Call made to patient to make aware. Voiced understanding. Nothing further needed at this time.

## 2019-04-23 NOTE — Progress Notes (Signed)
Subjective:    Patient ID: Kathleen Solis, female    DOB: 09-Apr-1966, 53 y.o.   MRN: [id number]  HPI 53 year old never smoker with a history of hyperlipidemia, hypertension, cerebrovascular disease with history of TIA, allergic rhinitis.  She is referred today for evaluation of chronic cough.  She reports that the cough began about 2 months ago.  In retrospect she has had bouts of cough before, often treated as bronchitis, much shorter lived. Is particularly bad at night when supine. Can wake her from sleep, sleeps on 2 pillows. Non-productive. She has had some off/on sore throat, including at the beginning of the illness, along w viral sx, body aches. She does have some nasal gtt.  No overt reflux sx.   She was treated with azithromycin and then doxycycline without any resolution.  She was tried on Breo for about a week early in the course. Has received cough suppression with moderate success.   Chest x-ray from 07/10/2018 reviewed, shows no infiltrates or any other abnormalities.  Lung windows from a CT scan of the abdomen from 09/13/2018 did not show any abnormalities either.   Review of Systems  Constitutional: Negative for fever and unexpected weight change.  HENT: Negative for congestion, dental problem, ear pain, nosebleeds, postnasal drip, rhinorrhea, sinus pressure, sneezing, sore throat and trouble swallowing.   Eyes: Negative for redness and itching.  Respiratory: Positive for cough. Negative for chest tightness, shortness of breath and wheezing.   Cardiovascular: Negative for palpitations and leg swelling.  Gastrointestinal: Negative for nausea and vomiting.  Genitourinary: Negative for dysuria.  Musculoskeletal: Negative for joint swelling.  Skin: Negative for rash.  Neurological: Negative for headaches.  Hematological: Does not bruise/bleed easily.  Psychiatric/Behavioral: Negative for dysphoric mood. The patient is not nervous/anxious.     Past Medical History:  Diagnosis  Date  . Diverticulitis   . Hyperlipidemia    diet controlled - no med  . Hypertension   . Sciatica    right  . Seasonal allergies   . Stroke [facility]) 2000   several mini strokes, weakness on right arm and leg  . SVD (spontaneous vaginal delivery)    x 2     Family History  Problem Relation Age of Onset  . Breast cancer Sister   . Diabetes Sister   . Glaucoma Sister   . Cataracts Sister   . Hypertension Sister   . Anuerysm Mother   . Pancreatic cancer Father   . Retinal detachment Brother   . Cataracts Sister      Social History   Socioeconomic History  . Marital status: Single    Spouse name: Not on file  . Number of children: 2  . Years of education: Not on file  . Highest education level: Associate degree: academic program  Occupational History  . Occupation: disabled  Social Needs  . Financial resource strain: Not on file  . Food insecurity    Worry: Not on file    Inability: Not on file  . Transportation needs    Medical: Not on file    Non-medical: Not on file  Tobacco Use  . Smoking status: Never Smoker  . Smokeless tobacco: Never Used  Substance and Sexual Activity  . Alcohol use: No  . Drug use: No  . Sexual activity: Not Currently    Birth control/protection: Surgical  Lifestyle  . Physical activity    Days per week: Not on file    Minutes per session: Not on file  .  Stress: Not on file  Relationships  . Social Musicianconnections    Talks on phone: Not on file    Gets together: Not on file    Attends religious service: Not on file    Active member of club or organization: Not on file    Attends meetings of clubs or organizations: Not on file    Relationship status: Not on file  . Intimate partner violence    Fear of current or ex partner: Not on file    Emotionally abused: Not on file    Physically abused: Not on file    Forced sexual activity: Not on file  Other Topics Concern  . Not on file  Social History Narrative   No regular exercise       Pt is right handed, she occasionally drinks tea, walks QOD. She lives with her 52 yr old son, he has mild cerebral palsy.  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 [place]  Allergies  Allergen Reactions  . Darvon [Propoxyphene] Other (See Comments)    Gi problems  . Ivp Dye [Iodinated Diagnostic Agents]   . Morphine And Related Itching  . Prednisone     Make her head hurt  . Sulfa Antibiotics Hives  . Tussionex Pennkinetic Er [Hydrocod Polst-Cpm Polst Er]     Nausea, vomiting  . Buprenorphine Hcl Itching  . Prochlorperazine Edisylate Itching and Other (See Comments)    "Acts weird: Hallucinations"     Outpatient Medications Prior to Visit  Medication Sig Dispense Refill  . albuterol (VENTOLIN HFA) 108 (90 Base) MCG/ACT inhaler Inhale 2 puffs into the lungs every 6 (six) hours as needed for wheezing or shortness of breath. 6.7 g 1  . aspirin EC 81 MG tablet Take 81 mg by mouth at bedtime.     Marland Kitchen. atorvastatin (LIPITOR) 10 MG tablet Take 1 tablet (10 mg total) by mouth at bedtime. 90 tablet 1  . benzonatate (TESSALON) 100 MG capsule Take 1 capsule (100 mg total) by mouth 2 (two) times daily as needed for cough. 30 capsule 0  . hydrochlorothiazide (HYDRODIURIL) 25 MG tablet Take 1 tablet by mouth once daily. . 90 tablet 1  . metoprolol succinate (TOPROL-XL) 100 MG 24 hr tablet Take 1 tablet (100 mg total) by mouth at bedtime. 90 tablet 1   No facility-administered medications prior to visit.         Objective:   Physical Exam Vitals:   04/23/19 1454  BP: 118/84  Pulse: 78  SpO2: 98%  Weight: 175 lb (79.4 kg)  Height: 5' 5.5" (1.664 m)   Gen: Pleasant, well-nourished, in no distress,  normal affect  ENT: No lesions,  mouth clear,  oropharynx clear, no postnasal drip, strong voice  Neck: No JVD, no stridor  Lungs: No use of accessory muscles, no crackles or wheezing on normal respiration, no wheeze on forced expiration  Cardiovascular: RRR, heart sounds normal, no murmur  or gallops, no peripheral edema  Musculoskeletal: No deformities, no cyanosis or clubbing  Neuro: alert, awake, non focal  Skin: Warm, no lesions or rash      Assessment & Plan:  Cough Chronic cough that has persisted for at least 2 months.  Appeared to have been originally precipitated by a viral syndrome.  COVID negative.  No response to empiric antibiotic treatment, empiric Breo.  Based on history and current symptoms suspect chronic upper airway irritation syndrome.  She does have some allergic symptoms and this could be a contributor.  Also consider occult GERD.  I think we should empirically treat both, see if she gets resolution.  I would consider empiric short course of prednisone but she has great difficulty tolerating this medication and we should defer at least for now.  She needs a chest x-ray, pulmonary function testing to rule out other potential causes.  If we are unsuccessful identifying a cause or resolving her cough then would consider bronchoscopy.  Please start loratadine 10 mg once daily. Please take chlorpheniramine (over-the-counter) 4 mg each evening before bed Start pantoprazole 40 mg once daily.  Take this medication 1 hour around food. Please use Tessalon Perles 200 mg up to every 6 hours if needed for cough suppression. Please use Hycodan cough syrup 5 cc up to every 6 hours if needed for cough suppression. Try to practice voice rest Try to avoid throat clearing.  You may benefit from using a sugar-free candy or non-methylated cough drop.  When you have the urge to clear your throat, just swallow. We will perform pulmonary function testing Chest x-ray today. Follow with Dr Delton Coombes in 1 month with full PFT on the same day.  Levy Pupa, MD, PhD 04/23/2019, 3:41 PM [facility] [phone] or if no answer [phone]

## 2019-04-23 NOTE — Telephone Encounter (Signed)
Spoke with pt, she wanted to let Dr. Lamonte Sakai know that the cough medication that helped with her cough was promethazine-codeine instead of Hydrocan. She states the Hycodan makes her sick and she thought she mentioned this to you. Can we change it to Promethazine-codeine syrup? RB please advise.

## 2019-04-23 NOTE — Patient Instructions (Signed)
Please start loratadine 10 mg once daily. Please take chlorpheniramine (over-the-counter) 4 mg each evening before bed Start pantoprazole 40 mg once daily.  Take this medication 1 hour around food. Please use Tessalon Perles 200 mg up to every 6 hours if needed for cough suppression. Please use Hycodan cough syrup 5 cc up to every 6 hours if needed for cough suppression. Try to practice voice rest Try to avoid throat clearing.  You may benefit from using a sugar-free candy or non-methylated cough drop.  When you have the urge to clear your throat, just swallow. We will perform pulmonary function testing Chest x-ray today. Follow with Dr Lamonte Sakai in 1 month with full PFT on the same day.

## 2019-04-23 NOTE — Assessment & Plan Note (Signed)
Chronic cough that has persisted for at least 2 months.  Appeared to have been originally precipitated by a viral syndrome.  COVID negative.  No response to empiric antibiotic treatment, empiric Breo.  Based on history and current symptoms suspect chronic upper airway irritation syndrome.  She does have some allergic symptoms and this could be a contributor.  Also consider occult GERD.  I think we should empirically treat both, see if she gets resolution.  I would consider empiric short course of prednisone but she has great difficulty tolerating this medication and we should defer at least for now.  She needs a chest x-ray, pulmonary function testing to rule out other potential causes.  If we are unsuccessful identifying a cause or resolving her cough then would consider bronchoscopy.  Please start loratadine 10 mg once daily. Please take chlorpheniramine (over-the-counter) 4 mg each evening before bed Start pantoprazole 40 mg once daily.  Take this medication 1 hour around food. Please use Tessalon Perles 200 mg up to every 6 hours if needed for cough suppression. Please use Hycodan cough syrup 5 cc up to every 6 hours if needed for cough suppression. Try to practice voice rest Try to avoid throat clearing.  You may benefit from using a sugar-free candy or non-methylated cough drop.  When you have the urge to clear your throat, just swallow. We will perform pulmonary function testing Chest x-ray today. Follow with Dr Lamonte Sakai in 1 month with full PFT on the same day.

## 2019-04-23 NOTE — Telephone Encounter (Signed)
I tried to change it - may need to confirm that it went through Thanks

## 2019-04-24 ENCOUNTER — Telehealth: Payer: Self-pay | Admitting: Emergency Medicine

## 2019-04-24 MED ORDER — PROMETHAZINE-CODEINE 6.25-10 MG/5ML PO SYRP: 5.0000 mL | ORAL_SOLUTION | Freq: Four times a day (QID) | ORAL | 0 refills | Status: DC | PRN

## 2019-04-24 NOTE — Telephone Encounter (Signed)
I have given a verbal order to [facility] with [employer]. Per RB's note from yesterday, this was okay to fill. Nothing further was needed.

## 2019-04-30 ENCOUNTER — Telehealth: Payer: Self-pay | Admitting: Internal Medicine

## 2019-04-30 NOTE — Telephone Encounter (Signed)
Patient requesting refill of metoprolol and  hydrochlorothiazide to be sent to [facility].

## 2019-05-01 MED ORDER — HYDROCHLOROTHIAZIDE 25 MG PO TABS: ORAL_TABLET | ORAL | 0 refills | Status: DC

## 2019-05-01 MED ORDER — METOPROLOL SUCCINATE ER 100 MG PO TB24: 100.0000 mg | ORAL_TABLET | Freq: Every day | ORAL | 0 refills | Status: DC

## 2019-05-01 NOTE — Telephone Encounter (Signed)
Rx sent 

## 2019-05-03 ENCOUNTER — Telehealth: Payer: Self-pay | Admitting: Emergency Medicine

## 2019-05-03 ENCOUNTER — Telehealth (INDEPENDENT_AMBULATORY_CARE_PROVIDER_SITE_OTHER): Payer: Medicare Other | Admitting: Primary Care

## 2019-05-03 DIAGNOSIS — R05 Cough: Secondary | ICD-10-CM | POA: Diagnosis not present

## 2019-05-03 DIAGNOSIS — R053 Chronic cough: Secondary | ICD-10-CM

## 2019-05-03 MED ORDER — FLUTICASONE PROPIONATE 50 MCG/ACT NA SUSP: 1.0000 | Freq: Every day | NASAL | 2 refills | Status: DC

## 2019-05-03 MED ORDER — PROMETHAZINE-CODEINE 6.25-10 MG/5ML PO SYRP: 5.0000 mL | ORAL_SOLUTION | Freq: Four times a day (QID) | ORAL | 0 refills | Status: DC | PRN

## 2019-05-03 MED ORDER — METHYLPREDNISOLONE 4 MG PO TBPK: ORAL_TABLET | ORAL | 0 refills | Status: DC

## 2019-05-03 NOTE — Telephone Encounter (Signed)
Sorry to hear the patient is not feeling well.  Please schedule as a MyChart video visit if able or a tele-visit with EW this afternoon.  Will need to further evaluate.  [place] PMP aware shows that patient has been routinely receiving promethazine cough syrup for months now.  Patient was also given 200 mL's earlier this month.  We need to further evaluate patient's cough as well as follow-up regarding the status of her pulmonary function test.  This will be best served via a video or tele-visit if patient does not have the capabilities to do a video visit.Wyn Quaker, FNP

## 2019-05-03 NOTE — Patient Instructions (Signed)
-   Continue loratadine 10 mg daily AND Chlortab 4 mg at bedtime - Continue pantoprazole 40 mg daily 30 min before breakfast - Start Flonase nasal spray 1 puff per nostril daily - RX medrol 4MG  dose pack  - stop if develop intolerance/Tylenol for headache  - Continue Pro-air rescue inhaler 2 puff every 6 hours for shortness of breath/wheezing - Refill promethazine with codeine cough syrup 5 mL's every 6 hours as needed (taper use once cough suppression 2-3 days)   Follow-up: Dr. Lamonte Sakai in 2 to 4 weeks with PFTs on same day

## 2019-05-03 NOTE — Progress Notes (Signed)
Virtual Visit via Video Note  I connected with Kathleen Solis on 05/03/19 at  3:30 PM EDT by a video enabled telemedicine application and verified that I am speaking with the correct person using two identifiers.  Location: Patient: Home Provider: Office   I discussed the limitations of evaluation and management by telemedicine and the availability of in person appointments. The patient expressed understanding and agreed to proceed.  History of Present Illness: 53 year old female, never smoked.  Patient of Dr. Lamonte Sakai, seen on 04/23/19 consult for cough x2 months.  Cough felt to be related to post viral syndrome and upper airway irritation.  Chest x-ray showed no active cardiopulmonary disease.   05/03/2019 Patient called today for acute televisit. She continues to have moderate dry/hacking cough with voice hoarseness.  States that prescription cough medication has helped some.  She reports compliance with loratadine, Chlortab 4 mg at bedtime, pantoprazole, Tessalon Perles 200 mg every 6 hours.  She has tried prednisone in the past but reports adverse side effect of headache.  Per Dr. Agustina Caroli note he would consider this if symptoms persist.  She needs PFTs scheduled.    Observations/Objective:  - Persistent dry/hacking cough, moderate voice hoarseness - Audible nasal congestion  Assessment and Plan:  Chronic cough - Felt to be d/t post viral syndrome and upper airway irritation  - Continue loratadine 10 mg daily, Chlortab 4 mg at bedtime - Continue pantoprazole 40 mg daily - Advised Flonase 1 puff per nostril daily - Trial medrol 4MG  dose pack  - stop if develop intolerance  - Refill promethazine with codeine cough syrup 5 mL's every 6 hours as needed (taper use once cough suppression 2-3 days) - Needs to schedule pulmonary function test  Follow Up Instructions:  -Follow-up with Dr. Lamonte Sakai in 2 to 4 weeks with PFTs on same day  I discussed the assessment and treatment plan with the  patient. The patient was provided an opportunity to ask questions and all were answered. The patient agreed with the plan and demonstrated an understanding of the instructions.   The patient was advised to call back or seek an in-person evaluation if the symptoms worsen or if the condition fails to improve as anticipated.  I provided 20 minutes of non-face-to-face time during this encounter.   Martyn Ehrich, NP

## 2019-05-03 NOTE — Telephone Encounter (Signed)
Called pt and advised message from the provider. Pt understood and verbalized understanding. Nothing further is needed.   Video visit with Kathleen Solis at 3:30 scheduled

## 2019-05-03 NOTE — Telephone Encounter (Signed)
Spoke with pt, she would like to send in promethazine-codeine cough syrup. She states after the cough medicine wears off she starts coughing. She is not sleeping at night and she has been using ProAir inhaler but doesn't know if its really helping. She is taking the tessalon pearls with the cough syrup so she doesn't know if it helps without it. She will be out of cough medication tomorrow and request we send another prescription in. Please advise.   Patrice she is asking about scheduling her PFT, can you check the status on this?  [facility]     Patient Instructions by Collene Gobble, MD at 04/23/2019 3:00 PM Author: Collene Gobble, MD Author Type: Physician Filed: 04/23/2019 3:39 PM  Note Status: Signed Cosign: Cosign Not Required Encounter Date: 04/23/2019  Editor: Collene Gobble, MD (Physician)    Please start loratadine 10 mg once daily. Please take chlorpheniramine (over-the-counter) 4 mg each evening before bed Start pantoprazole 40 mg once daily.  Take this medication 1 hour around food. Please use Tessalon Perles 200 mg up to every 6 hours if needed for cough suppression. Please use Hycodan cough syrup 5 cc up to every 6 hours if needed for cough suppression. Try to practice voice rest Try to avoid throat clearing.  You may benefit from using a sugar-free candy or non-methylated cough drop.  When you have the urge to clear your throat, just swallow. We will perform pulmonary function testing Chest x-ray today. Follow with Dr Lamonte Sakai in 1 month with full PFT on the same day.

## 2019-05-05 NOTE — Progress Notes (Addendum)
Virtual Visit via Video Note The purpose of this virtual visit is to provide medical care while limiting exposure to the novel coronavirus.    Consent was obtained for video visit:  Yes Answered questions that patient had about telehealth interaction:  Yes I discussed the limitations, risks, security and privacy concerns of performing an evaluation and management service by telemedicine. I also discussed with the patient that there may be a patient responsible charge related to this service. The patient expressed understanding and agreed to proceed.  Pt location: Home Physician Location: Home Name of referring provider:  Pincus Sanes, MD I connected with Kathleen Solis at patients initiation/request on 05/06/2019 at  9:10 AM EDT by video enabled telemedicine application and verified that I am speaking with the correct person using two identifiers. Pt MRN:  [id number] Pt DOB:  [birth year] Video Participants:  Kathleen Solis   History of Present Illness:  Kathleen Solis is a 53 year old right-handed woman with hypertension, hyperlipidemia, carotid artery disease, tension type headache and history of stroke who follows up for headache  UPDATE: She reports return of headaches on top of head, throbbing, for about a week.  They typically occur at night.  No new facial weakness, speech or language dysfunction or unilateral numbness or weakness.  No new stress or environment.  She had taken a Medrol dose pak last week for asthma and coughing for 4 days.  She stopped taking it yesterday because in past, steroids have given her headaches.    Intensity:  6/10 Duration:  Takes the Tylenol and lays down and falls asleep.   Frequency:  daily Current NSAIDS:  ASA 81mg  daily Current analgesics:  Takes Extra-Strength Tylenol for the headaches. Current triptans:  none Current ergotamine:  none Current anti-emetic:  none Current muscle relaxants:  none Current anti-anxiolytic:  none Current sleep  aide:  none Current Antihypertensive medications:  Toprol XL; HCTZ Current Antidepressant medications:  none Current Anticonvulsant medications:  none Current anti-CGRP:  none Current Vitamins/Herbal/Supplements:  none Current Antihistamines/Decongestants:  Flonase Other therapy:  rest   HISTORY: In 2001, she had a stroke while pregnant with her son. She was told it was due to elevated blood pressure. She presented with slurred speech and right leg weakness. As a result, her son was born with cerebral palsy. She has been followed by multiple neurologists. Since the stroke, she has been on ASA 81mg  daily. Non-contrast MRI brain report from 02/15/14 demonstrated minimal scattered T2 hyperintensities in the left periventricular white matter regions suggesting chronic microvascular degenerative disease but no acute infarcts, mass lesions or demyelinating disease. This was also noted on prior MRI from 2012.  She presented to [facility] on 11/24/15 for acute onset right sided headache and numbness with trouble walking. CT of head was personally reviewed and revealed no acute findings. MRI of brain was ordered but she left AMA. She followed up with neurologist, Dr. Stacy Gardner the following month. MRI of brain with and without contrast performed on 01/19/16 again demonstrated patchy nonspecific cerebral white matter signal changes in left frontal lobe and periatrial white matter, possibly indicative of remote ischemic stroke. Abnormal flow in the left cervical ICA noted as well. Carotid doppler from 03/16/16 reportedly showed no hemodynamically significant stenosis.A repeat MRI of the brain from 11/30/16 was personally reviewed to evaluate tinnitus in the right ear and again demonstrated chronic white matter changes, left greater than right, in watershed distribution, but no acute findings.  She was treated for episodic  tension type headaches as well.The are moderate intensity  non-throbbing headache on the top of her head. They last 15 to 20 minutes and occur daily. There are no associated symptoms such as nausea, photophobia, phonophobia or unilateral numbness or weakness.She was always hesitant about starting a preventative. She treated them with ibuprofen until she stopped because it was found to be the cause of her tinnitus.  She was previously on atorvastatin 40mg  but stopped due to leg pain.  She has been on disability since 2007. She continues to have right leg weakness and trouble with speech. She has memory deficits. She is overall independent. On one occasion, she got lost while driving on familiar route. She usually uses a GPS. Since her stroke, she has insomnia. She previously had a sleep study that demonstrated mild OSA not requiring CPAP. She denies depression.  Past Medical History: Past Medical History:  Diagnosis Date  . Diverticulitis   . Hyperlipidemia    diet controlled - no med  . Hypertension   . Sciatica    right  . Seasonal allergies   . Stroke [facility]) 2000   several mini strokes, weakness on right arm and leg  . SVD (spontaneous vaginal delivery)    x 2    Medications: Outpatient Encounter Medications as of 05/06/2019  Medication Sig  . albuterol (VENTOLIN HFA) 108 (90 Base) MCG/ACT inhaler Inhale 2 puffs into the lungs every 6 (six) hours as needed for wheezing or shortness of breath.  Marland Kitchen aspirin EC 81 MG tablet Take 81 mg by mouth at bedtime.   Marland Kitchen atorvastatin (LIPITOR) 10 MG tablet Take 1 tablet (10 mg total) by mouth at bedtime.  . benzonatate (TESSALON) 100 MG capsule Take 1 capsule (100 mg total) by mouth 2 (two) times daily as needed for cough.  . benzonatate (TESSALON) 200 MG capsule Take 1 capsule (200 mg total) by mouth 3 (three) times daily as needed for cough.  . fluticasone (FLONASE) 50 MCG/ACT nasal spray Place 1 spray into both nostrils daily.  . hydrochlorothiazide (HYDRODIURIL) 25 MG tablet Take 1 tablet  by mouth once daily. .  . methylPREDNISolone (MEDROL) 4 MG TBPK tablet Per Medrol Dosepak instructions  . metoprolol succinate (TOPROL-XL) 100 MG 24 hr tablet Take 1 tablet (100 mg total) by mouth at bedtime.  . pantoprazole (PROTONIX) 40 MG tablet Take 1 tablet (40 mg total) by mouth daily.  . promethazine-codeine (PHENERGAN WITH CODEINE) 6.25-10 MG/5ML syrup Take 5 mLs by mouth every 6 (six) hours as needed for cough.   No facility-administered encounter medications on file as of 05/06/2019.     Allergies: Allergies  Allergen Reactions  . Darvon [Propoxyphene] Other (See Comments)    Gi problems  . Ivp Dye [Iodinated Diagnostic Agents]   . Morphine And Related Itching  . Prednisone     Make her head hurt  . Sulfa Antibiotics Hives  . Tussionex Pennkinetic Er [Hydrocod Polst-Cpm Polst Er]     Nausea, vomiting  . Buprenorphine Hcl Itching  . Prochlorperazine Edisylate Itching and Other (See Comments)    "Acts weird: Hallucinations"    Family History: Family History  Problem Relation Age of Onset  . Breast cancer Sister   . Diabetes Sister   . Glaucoma Sister   . Cataracts Sister   . Hypertension Sister   . Anuerysm Mother   . Pancreatic cancer Father   . Retinal detachment Brother   . Cataracts Sister     Social History: Social History  Socioeconomic History  . Marital status: Single    Spouse name: Not on file  . Number of children: 2  . Years of education: Not on file  . Highest education level: Associate degree: academic program  Occupational History  . Occupation: disabled  Social Needs  . Financial resource strain: Not on file  . Food insecurity    Worry: Not on file    Inability: Not on file  . Transportation needs    Medical: Not on file    Non-medical: Not on file  Tobacco Use  . Smoking status: Never Smoker  . Smokeless tobacco: Never Used  Substance and Sexual Activity  . Alcohol use: No  . Drug use: No  . Sexual activity: Not Currently     Birth control/protection: Surgical  Lifestyle  . Physical activity    Days per week: Not on file    Minutes per session: Not on file  . Stress: Not on file  Relationships  . Social Musicianconnections    Talks on phone: Not on file    Gets together: Not on file    Attends religious service: Not on file    Active member of club or organization: Not on file    Attends meetings of clubs or organizations: Not on file    Relationship status: Not on file  . Intimate partner violence    Fear of current or ex partner: Not on file    Emotionally abused: Not on file    Physically abused: Not on file    Forced sexual activity: Not on file  Other Topics Concern  . Not on file  Social History Narrative   No regular exercise      Pt is right handed, she occasionally drinks tea, walks QOD. She lives with her 714 yr old son, he has mild cerebral palsy.    Observations/Objective:   Height 5\' 5"  (1.651 m), weight 175 lb (79.4 kg). No acute distress.  Alert and oriented.  Speech fluent and not dysarthric.  Language intact.  Eyes orthophoric on primary gaze.  Face symmetric.  Assessment and Plan:   1.  Daily headaches.  Semiology consistent with her prior history of tension-type headaches.  Onset correlates with starting Medrol dose pak.  Reports that steroids have triggered headaches in the past.  No reported red flags.  I think her headaches are being triggered by the steroid.   2.  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 3.  Vascular cognitive impairment  1. Over the next few days, we will see if headaches improved.  If not improved by Thursday, she will contact us and perhaps she may come in for a headache cocktail. 2. Limit use of Tylenol Extra-Strength to no more than 2 days out of week to prevent risk of rebound or medication-overuse headache.  Follow Up Instructions:    -I discussed the assessment and treatment  plan with the patient. The patient was provided an opportunity to ask questions and all were answered. The patient agreed with the plan and demonstrated an understanding of the instructions.   The patient was advised to call back or seek an in-person evaluation if the symptoms worsen or if the condition fails to improve as anticipated.    Total Time spent in visit with the patient was:  11 minutes   Cira ServantAdam Robert Shanard Treto, DO

## 2019-05-06 ENCOUNTER — Other Ambulatory Visit: Payer: Self-pay

## 2019-05-06 ENCOUNTER — Encounter: Payer: Self-pay | Admitting: Neurology

## 2019-05-06 ENCOUNTER — Telehealth (INDEPENDENT_AMBULATORY_CARE_PROVIDER_SITE_OTHER): Payer: Medicare Other | Admitting: Neurology

## 2019-05-06 VITALS — Ht 65.0 in | Wt 175.0 lb

## 2019-05-06 DIAGNOSIS — R51 Headache: Secondary | ICD-10-CM | POA: Diagnosis not present

## 2019-05-06 DIAGNOSIS — R519 Headache, unspecified: Secondary | ICD-10-CM

## 2019-05-09 NOTE — Telephone Encounter (Signed)
pft schedule 10/13-pr

## 2019-05-13 ENCOUNTER — Ambulatory Visit: Payer: Self-pay | Admitting: Internal Medicine

## 2019-05-23 ENCOUNTER — Other Ambulatory Visit: Payer: Self-pay | Admitting: Emergency Medicine

## 2019-05-30 ENCOUNTER — Other Ambulatory Visit (HOSPITAL_COMMUNITY)
Admission: RE | Admit: 2019-05-30 | Discharge: 2019-05-30 | Disposition: A | Discharge status: Home / Self Care | Payer: Medicare Other | Source: Ambulatory Visit | Attending: Emergency Medicine | Admitting: Emergency Medicine

## 2019-05-30 DIAGNOSIS — Z20828 Contact with and (suspected) exposure to other viral communicable diseases: Secondary | ICD-10-CM | POA: Insufficient documentation

## 2019-05-30 DIAGNOSIS — Z01812 Encounter for preprocedural laboratory examination: Secondary | ICD-10-CM | POA: Insufficient documentation

## 2019-05-31 LAB — NOVEL CORONAVIRUS, NAA (HOSP ORDER, SEND-OUT TO REF LAB; TAT 18-24 HRS): SARS-CoV-2, NAA: NOT DETECTED

## 2019-06-04 ENCOUNTER — Telehealth: Payer: Self-pay | Admitting: Emergency Medicine

## 2019-06-04 ENCOUNTER — Ambulatory Visit (INDEPENDENT_AMBULATORY_CARE_PROVIDER_SITE_OTHER): Payer: Medicare Other | Admitting: Emergency Medicine

## 2019-06-04 ENCOUNTER — Ambulatory Visit: Payer: Medicare Other | Admitting: Emergency Medicine

## 2019-06-04 ENCOUNTER — Encounter: Payer: Self-pay | Admitting: Emergency Medicine

## 2019-06-04 ENCOUNTER — Other Ambulatory Visit: Payer: Self-pay

## 2019-06-04 DIAGNOSIS — R05 Cough: Secondary | ICD-10-CM

## 2019-06-04 DIAGNOSIS — R059 Cough, unspecified: Secondary | ICD-10-CM

## 2019-06-04 LAB — PULMONARY FUNCTION TEST
DL/VA % pred: 106 %
DL/VA: 4.51 ml/min/mmHg/L
DLCO unc % pred: 89 %
DLCO unc: 19.92 ml/min/mmHg
FEF 25-75 Post: 4.26 L/sec
FEF 25-75 Pre: 2.85 L/sec
FEF2575-%Change-Post: 49 %
FEF2575-%Pred-Post: 170 %
FEF2575-%Pred-Pre: 113 %
FEV1-%Change-Post: 17 %
FEV1-%Pred-Post: 107 %
FEV1-%Pred-Pre: 91 %
FEV1-Post: 2.62 L
FEV1-Pre: 2.23 L
FEV1FVC-%Change-Post: 1 %
FEV1FVC-%Pred-Pre: 104 %
FEV6-%Change-Post: 15 %
FEV6-%Pred-Post: 102 %
FEV6-%Pred-Pre: 88 %
FEV6-Post: 3.05 L
FEV6-Pre: 2.63 L
FEV6FVC-%Change-Post: 0 %
FEV6FVC-%Pred-Post: 102 %
FEV6FVC-%Pred-Pre: 102 %
FVC-%Change-Post: 16 %
FVC-%Pred-Post: 100 %
FVC-%Pred-Pre: 86 %
FVC-Post: 3.06 L
FVC-Pre: 2.63 L
Post FEV1/FVC ratio: 86 %
Post FEV6/FVC ratio: 100 %
Pre FEV1/FVC ratio: 85 %
Pre FEV6/FVC Ratio: 100 %
RV % pred: 111 %
RV: 2.13 L
TLC % pred: 92 %
TLC: 4.92 L

## 2019-06-04 MED ORDER — PROMETHAZINE-CODEINE 6.25-10 MG/5ML PO SYRP: 5.0000 mL | ORAL_SOLUTION | Freq: Four times a day (QID) | ORAL | 0 refills | Status: DC | PRN

## 2019-06-04 MED ORDER — BUDESONIDE-FORMOTEROL FUMARATE 160-4.5 MCG/ACT IN AERO: 2.0000 | INHALATION_SPRAY | Freq: Two times a day (BID) | RESPIRATORY_TRACT | 0 refills | Status: DC

## 2019-06-04 MED ORDER — BENZONATATE 200 MG PO CAPS: 200.0000 mg | ORAL_CAPSULE | Freq: Three times a day (TID) | ORAL | 1 refills | Status: DC | PRN

## 2019-06-04 NOTE — Progress Notes (Signed)
Subjective:    Patient ID: Kathleen Solis, female    DOB: 1965/11/25, 53 y.o.   MRN: [id number]  HPI 53 year old never smoker with a history of hyperlipidemia, hypertension, cerebrovascular disease with history of TIA, allergic rhinitis.  She is referred today for evaluation of chronic cough.  She reports that the cough began about 2 months ago.  In retrospect she has had bouts of cough before, often treated as bronchitis, much shorter lived. Is particularly bad at night when supine. Can wake her from sleep, sleeps on 2 pillows. Non-productive. She has had some off/on sore throat, including at the beginning of the illness, along w viral sx, body aches. She does have some nasal gtt.  No overt reflux sx.   She was treated with azithromycin and then doxycycline without any resolution.  She was tried on Breo for about a week early in the course. Has received cough suppression with moderate success.   Chest x-ray from 07/10/2018 reviewed, shows no infiltrates or any other abnormalities.  Lung windows from a CT scan of the abdomen from 09/13/2018 did not show any abnormalities either.  ROV 06/04/2019 --this is a follow-up visit 53 year old woman with hypertension, cerebrovascular disease with a TIA, allergic rhinitis.  We have been following for chronic cough of about 3 months duration.  She has been using loratadine, Chlortab bedtime, pantoprazole.  She started fluticasone nasal spray in September.  Was also treated with promethazine/codeine cough suppression, tessalon perles and an empiric trial of Medrol > couldn't take due to HA.  She returns today reporting that she continues to cough, probably less frequently. She is having cough significant at night, can wake her from sleep. In retrospect she believes that albuterol may have helped her some, made her cough a bit better.  Pulmonary function testing today reviewed by me, shows normal prebronchodilator airflows but a clear bronchodilator response  consistent with mild asthma.  Lung volumes and diffusion capacity normal.   Review of Systems  Constitutional: Negative for fever and unexpected weight change.  HENT: Negative for congestion, dental problem, ear pain, nosebleeds, postnasal drip, rhinorrhea, sinus pressure, sneezing, sore throat and trouble swallowing.   Eyes: Negative for redness and itching.  Respiratory: Positive for cough. Negative for chest tightness, shortness of breath and wheezing.   Cardiovascular: Negative for palpitations and leg swelling.  Gastrointestinal: Negative for nausea and vomiting.  Genitourinary: Negative for dysuria.  Musculoskeletal: Negative for joint swelling.  Skin: Negative for rash.  Neurological: Negative for headaches.  Hematological: Does not bruise/bleed easily.  Psychiatric/Behavioral: Negative for dysphoric mood. The patient is not nervous/anxious.     Past Medical History:  Diagnosis Date  . Diverticulitis   . Hyperlipidemia    diet controlled - no med  . Hypertension   . Sciatica    right  . Seasonal allergies   . Stroke [facility](HCC) 2000   several mini strokes, weakness on right arm and leg  . SVD (spontaneous vaginal delivery)    x 2     Family History  Problem Relation Age of Onset  . Breast cancer Sister   . Diabetes Sister   . Glaucoma Sister   . Cataracts Sister   . Hypertension Sister   . Anuerysm Mother   . Pancreatic cancer Father   . Retinal detachment Brother   . Cataracts Sister      Social History   Socioeconomic History  . Marital status: Single    Spouse name: Not on file  . Number of  children: 2  . Years of education: Not on file  . Highest education level: Associate degree: academic program  Occupational History  . Occupation: disabled  Social Needs  . Financial resource strain: Not on file  . Food insecurity    Worry: Not on file    Inability: Not on file  . Transportation needs    Medical: Not on file    Non-medical: Not on file  Tobacco  Use  . Smoking status: Never Smoker  . Smokeless tobacco: Never Used  Substance and Sexual Activity  . Alcohol use: No  . Drug use: No  . Sexual activity: Not Currently    Birth control/protection: Surgical  Lifestyle  . Physical activity    Days per week: Not on file    Minutes per session: Not on file  . Stress: Not on file  Relationships  . Social Herbalist on phone: Not on file    Gets together: Not on file    Attends religious service: Not on file    Active member of club or organization: Not on file    Attends meetings of clubs or organizations: Not on file    Relationship status: Not on file  . Intimate partner violence    Fear of current or ex partner: Not on file    Emotionally abused: Not on file    Physically abused: Not on file    Forced sexual activity: Not on file  Other Topics Concern  . Not on file  Social History Narrative   No regular exercise      Pt is right handed, she occasionally drinks tea, walks QOD. She lives with her 40 yr old son, he has mild cerebral palsy.  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 [place]  Allergies  Allergen Reactions  . Darvon [Propoxyphene] Other (See Comments)    Gi problems  . Ivp Dye [Iodinated Diagnostic Agents]   . Morphine And Related Itching  . Prednisone     Make her head hurt  . Sulfa Antibiotics Hives  . Tussionex Pennkinetic Er [Hydrocod Polst-Cpm Polst Er]     Nausea, vomiting  . Buprenorphine Hcl Itching  . Prochlorperazine Edisylate Itching and Other (See Comments)    "Acts weird: Hallucinations"     Outpatient Medications Prior to Visit  Medication Sig Dispense Refill  . albuterol (VENTOLIN HFA) 108 (90 Base) MCG/ACT inhaler Inhale 2 puffs into the lungs every 6 (six) hours as needed for wheezing or shortness of breath. 6.7 g 1  . aspirin EC 81 MG tablet Take 81 mg by mouth at bedtime.     Marland Kitchen atorvastatin (LIPITOR) 10 MG tablet Take 1 tablet (10 mg total) by mouth at bedtime. 90 tablet 1   . fluticasone (FLONASE) 50 MCG/ACT nasal spray Place 1 spray into both nostrils daily. 16 g 2  . hydrochlorothiazide (HYDRODIURIL) 25 MG tablet Take 1 tablet by mouth once daily. . 90 tablet 0  . metoprolol succinate (TOPROL-XL) 100 MG 24 hr tablet Take 1 tablet (100 mg total) by mouth at bedtime. 90 tablet 0  . pantoprazole (PROTONIX) 40 MG tablet Take 1 tablet (40 mg total) by mouth daily. 30 tablet 5  . methylPREDNISolone (MEDROL) 4 MG TBPK tablet Per Medrol Dosepak instructions (Patient not taking: Reported on 05/06/2019) 21 each 0  . promethazine-codeine (PHENERGAN WITH CODEINE) 6.25-10 MG/5ML syrup Take 5 mLs by mouth every 6 (six) hours as needed for cough. 240 mL 0   No  facility-administered medications prior to visit.         Objective:   Physical Exam Vitals:   06/04/19 1615  BP: 120/78  Pulse: 78  SpO2: 98%  Weight: 197 lb (89.4 kg)  Height: 5' 5.75" (1.67 m)   Gen: Pleasant, well-nourished, in no distress,  normal affect  ENT: No lesions,  mouth clear,  oropharynx clear, no postnasal drip, strong voice  Neck: No JVD, no stridor  Lungs: No use of accessory muscles, no crackles or wheezing on normal respiration, no wheeze on forced expiration  Cardiovascular: RRR, heart sounds normal, no murmur or gallops, no peripheral edema  Musculoskeletal: No deformities, no cyanosis or clubbing  Neuro: alert, awake, non focal  Skin: Warm, no lesions or rash      Assessment & Plan:  Cough She clearly has upper airway irritation and has benefited to some degree from treatment of GERD, rhinitis.  Her pulmonary function testing today also confirms a bronchodilator response, probable mild asthma.  In retrospect she states that she may have benefited from albuterol.  I would like to start Symbicort to see if this will help with cough control.  We may need to do this through a spacer. Continue her GERD and allergy treatment.  Cough suppression with Tessalon Perles and  promethazine/codeine  Your pulmonary function testing shows some evidence for mild asthma.  This could be a contributor to your cough.  We will try starting Symbicort 2 puffs twice a day every day to see if you get any benefit.  Please remember to rinse and gargle after you use this medication. You can use albuterol 2 puffs up to every 4 hours if needed for spells of coughing. Please continue pantoprazole (Protonix) once daily.  Take this medication 1 hour around food. Please continue fluticasone nasal spray, 2 sprays each nostril once daily. Restart loratadine 10 mg (Claritin) once daily Start using chlorpheniramine (chlor tabs) once each evening at bedtime Use promethazine/codeine cough syrup up to every 6 hours as needed for cough suppression Use Tessalon Perles 200 mg up to every 6 hours as needed for cough suppression Follow with Dr Delton Coombes in 1 month  Levy Pupa, MD, PhD 06/04/2019, 5:17 PM [facility] [phone] or if no answer [phone]

## 2019-06-04 NOTE — Assessment & Plan Note (Signed)
She clearly has upper airway irritation and has benefited to some degree from treatment of GERD, rhinitis.  Her pulmonary function testing today also confirms a bronchodilator response, probable mild asthma.  In retrospect she states that she may have benefited from albuterol.  I would like to start Symbicort to see if this will help with cough control.  We may need to do this through a spacer. Continue her GERD and allergy treatment.  Cough suppression with Tessalon Perles and promethazine/codeine  Your pulmonary function testing shows some evidence for mild asthma.  This could be a contributor to your cough.  We will try starting Symbicort 2 puffs twice a day every day to see if you get any benefit.  Please remember to rinse and gargle after you use this medication. You can use albuterol 2 puffs up to every 4 hours if needed for spells of coughing. Please continue pantoprazole (Protonix) once daily.  Take this medication 1 hour around food. Please continue fluticasone nasal spray, 2 sprays each nostril once daily. Restart loratadine 10 mg (Claritin) once daily Start using chlorpheniramine (chlor tabs) once each evening at bedtime Use promethazine/codeine cough syrup up to every 6 hours as needed for cough suppression Use Tessalon Perles 200 mg up to every 6 hours as needed for cough suppression Follow with Dr Lamonte Sakai in 1 month

## 2019-06-04 NOTE — Progress Notes (Signed)
PFT done today. 

## 2019-06-04 NOTE — Patient Instructions (Addendum)
Your pulmonary function testing shows some evidence for mild asthma.  This could be a contributor to your cough.  We will try starting Symbicort 2 puffs twice a day every day to see if you get any benefit.  Please remember to rinse and gargle after you use this medication. You can use albuterol 2 puffs up to every 4 hours if needed for spells of coughing. Please continue pantoprazole (Protonix) once daily.  Take this medication 1 hour around food. Please continue fluticasone nasal spray, 2 sprays each nostril once daily. Restart loratadine 10 mg (Claritin) once daily Start using chlorpheniramine (chlor tabs) once each evening at bedtime Use promethazine/codeine cough syrup up to every 6 hours as needed for cough suppression Use Tessalon Perles 200 mg up to every 6 hours as needed for cough suppression Follow with Dr Lamonte Sakai in 1 month

## 2019-06-05 NOTE — Telephone Encounter (Signed)
Pt's appointment was yesterday, 06/05/2019. Her son came with her to her appointment. Message will be closed.

## 2019-06-26 ENCOUNTER — Telehealth: Payer: Self-pay | Admitting: Emergency Medicine

## 2019-06-26 NOTE — Telephone Encounter (Signed)
Spoke with the pt  She states tessalon pearles not helping with the cough  She has been using her promethazine with codeine syrup at night and states that this does help but she is now out  She is requesting a refill on this  Last filled # 200 ml on 06/04/19  Please advise thanks

## 2019-06-27 NOTE — Telephone Encounter (Signed)
Okay to fill x1.  I do not want this to become a chronic medication.

## 2019-06-27 NOTE — Telephone Encounter (Signed)
Called and spoke to patient. Relayed message that Dr. Lamonte Sakai will allow the refill. Verified pharmacy of choice is [facility] on [address].  Pended order for Dr. Lamonte Sakai to electronically sign due to being a controlled substance.  Routing to Dr. Lamonte Sakai.

## 2019-06-28 MED ORDER — PROMETHAZINE-CODEINE 6.25-10 MG/5ML PO SYRP: 5.0000 mL | ORAL_SOLUTION | Freq: Four times a day (QID) | ORAL | 0 refills | Status: DC | PRN

## 2019-06-28 NOTE — Telephone Encounter (Signed)
Signed this afternoon.

## 2019-06-28 NOTE — Telephone Encounter (Signed)
rx sent, and confirmation receipt from pharmacy is time stamped in chart. Called pt to make aware.   Nothing further needed at this time- will close encounter.

## 2019-06-28 NOTE — Telephone Encounter (Signed)
Pt calling to check status of her Promethazine with codeine medication refill.Stated that the pharmacy still hasn't gotten the refill request. If someone could give her a call back

## 2019-06-28 NOTE — Telephone Encounter (Signed)
Spoke with pt, states that rx has not yet been sent to pharmacy.   Per chart, RB has not yet signed this rx and it is still pended in pt's chart. Per office protocol, this cannot be routed to an APP for signature, and must be signed by the doc. Routing back to RB.  RB please sign rx for pt when you are able.  Thanks!!

## 2019-07-02 ENCOUNTER — Ambulatory Visit
Admission: RE | Admit: 2019-07-02 | Discharge: 2019-07-02 | Disposition: A | Discharge status: Home / Self Care | Payer: Medicare Other | Source: Ambulatory Visit | Attending: Internal Medicine | Admitting: Internal Medicine

## 2019-07-02 ENCOUNTER — Other Ambulatory Visit: Payer: Self-pay

## 2019-07-02 DIAGNOSIS — Z1231 Encounter for screening mammogram for malignant neoplasm of breast: Secondary | ICD-10-CM

## 2019-07-04 HISTORY — PX: OTHER SURGICAL HISTORY: SHX169

## 2019-07-23 ENCOUNTER — Telehealth: Payer: Medicare Other | Admitting: Emergency Medicine

## 2019-08-01 ENCOUNTER — Telehealth: Payer: Self-pay | Admitting: Emergency Medicine

## 2019-08-01 ENCOUNTER — Other Ambulatory Visit: Payer: Self-pay | Admitting: Emergency Medicine

## 2019-08-01 MED ORDER — PROMETHAZINE-CODEINE 6.25-10 MG/5ML PO SYRP: 5.0000 mL | ORAL_SOLUTION | Freq: Four times a day (QID) | ORAL | 0 refills | Status: DC | PRN

## 2019-08-01 NOTE — Telephone Encounter (Signed)
Spoke with the pt  She states needing refill on Symbicort and Promethazine with codeine syrup  She states that her cough has not improved at all on the symbicort  She states it may have helped with the SOB slightly  RB- do you want her to continue on this? And also please advise if okay to refill her cough syrup, thanks!

## 2019-08-01 NOTE — Telephone Encounter (Signed)
I called and spoke with the patient and made her aware and she verbalized understanding. I advised her to call us back after she has done the 2 week trial to let us know how she's doing. Nothing further is needed.

## 2019-08-01 NOTE — Telephone Encounter (Signed)
I'm ok with refilling the cough syrup x 1, don't want this to become a chronic medication. I will place an order. Ask her to do a trial for 2 weeks off the Symbicort to insure that the inhaler isn't one of the things causing her cough to persist.

## 2019-08-01 NOTE — Progress Notes (Signed)
Promethazine - codeine cough syrup refilled x 1

## 2019-08-09 ENCOUNTER — Other Ambulatory Visit: Payer: Self-pay | Admitting: Internal Medicine

## 2019-08-09 DIAGNOSIS — E7800 Pure hypercholesterolemia, unspecified: Secondary | ICD-10-CM

## 2019-08-09 MED ORDER — METOPROLOL SUCCINATE ER 100 MG PO TB24: 100.0000 mg | ORAL_TABLET | Freq: Every day | ORAL | 0 refills | Status: DC

## 2019-08-09 MED ORDER — HYDROCHLOROTHIAZIDE 25 MG PO TABS: ORAL_TABLET | ORAL | 0 refills | Status: DC

## 2019-08-09 MED ORDER — ATORVASTATIN CALCIUM 10 MG PO TABS: 10.0000 mg | ORAL_TABLET | Freq: Every day | ORAL | 1 refills | Status: DC

## 2019-08-09 NOTE — Telephone Encounter (Signed)
Medication Refill - Medication: metoprolol succinate (TOPROL-XL) 100 MG 24 hr tablet [[id number]]   atorvastatin (LIPITOR) 10 MG tablet [[id number]]   hydrochlorothiazide (HYDRODIURIL) 25 MG tablet [[id number]]    Preferred Pharmacy (with phone number or street name):  [facility], [place]  [place] [address]  Phone: [phone] Fax: [phone]     Agent: Please be advised that RX refills may take up to 3 business days. We ask that you follow-up with your pharmacy.

## 2019-08-20 ENCOUNTER — Encounter: Payer: Self-pay | Admitting: Emergency Medicine

## 2019-08-20 ENCOUNTER — Telehealth (INDEPENDENT_AMBULATORY_CARE_PROVIDER_SITE_OTHER): Payer: Medicare Other | Admitting: Emergency Medicine

## 2019-08-20 DIAGNOSIS — R059 Cough, unspecified: Secondary | ICD-10-CM

## 2019-08-20 DIAGNOSIS — R05 Cough: Secondary | ICD-10-CM | POA: Diagnosis not present

## 2019-08-20 MED ORDER — BUDESONIDE-FORMOTEROL FUMARATE 160-4.5 MCG/ACT IN AERO: 2.0000 | INHALATION_SPRAY | Freq: Two times a day (BID) | RESPIRATORY_TRACT | 5 refills | Status: DC

## 2019-08-20 NOTE — Progress Notes (Signed)
Virtual Visit via Video Note  I connected with Kathleen Solis on 08/20/19 at 11:30 AM EST by a video enabled telemedicine application and verified that I am speaking with the correct person using two identifiers.  Location: Patient: Home Provider: Office   I discussed the limitations of evaluation and management by telemedicine and the availability of in person appointments. The patient expressed understanding and agreed to proceed.  History of Present Illness: Kathleen Solis is 4, never smoker seen for chronic cough that appears to be impacted by both GERD and allergic rhinitis.  Spirometry has also identified evidence for mild intermittent asthma.  I treated her with loratadine, Chlortab, pantoprazole qd, fluticasone nasal spray.  In October we tried starting Symbicort to see if she would get any benefit.  I also gave her Hycodan, Tessalon for symptom management.   Observations/Objective: Had to refill her Hycodan earlier this month. She did get some benefit from the Symbicort - has run out of this. She wants to restart it. She is leaning on the hycodan at this time.   Assessment and Plan: Hope to get better control with restart Symbicort. Continue the allergy and GERD regimen. Continue PPI, could increase to BID going forward but woiuld like to avoid. Try to wean off tessalon and hycodan  Follow Up Instructions: 1 month to assess status on the symbicort.    I discussed the assessment and treatment plan with the patient. The patient was provided an opportunity to ask questions and all were answered. The patient agreed with the plan and demonstrated an understanding of the instructions.   The patient was advised to call back or seek an in-person evaluation if the symptoms worsen or if the condition fails to improve as anticipated.  I provided 16 minutes of non-face-to-face time during this encounter.   Collene Gobble, MD

## 2019-08-27 ENCOUNTER — Telehealth: Payer: Self-pay | Admitting: Emergency Medicine

## 2019-08-27 ENCOUNTER — Ambulatory Visit: Payer: Medicare Other | Attending: Internal Medicine

## 2019-08-27 DIAGNOSIS — Z20822 Contact with and (suspected) exposure to covid-19: Secondary | ICD-10-CM

## 2019-08-27 NOTE — Telephone Encounter (Signed)
Called and spoke to pt. Pt states she has tried to wean off the cough medicine (promethazine with codeine, last filled 08/01/2019) over the weekend but was unsuccessful. Pt states while on the cough medicine her cough was improved but while trying to wean her cough became worse with stress incontinence. Pt states she has been off of the Tessalon for about 2 weeks now due to it causing headaches. Pt is requesting a refill of the Promethazine with Codeine. Pt denies her cough worsening beyond what it was before and denies any new s/s.   Dr. Delton Coombes please advise. Thanks.

## 2019-08-28 ENCOUNTER — Telehealth: Payer: Self-pay | Admitting: Emergency Medicine

## 2019-08-28 LAB — NOVEL CORONAVIRUS, NAA: SARS-CoV-2, NAA: NOT DETECTED

## 2019-08-28 NOTE — Telephone Encounter (Signed)
Patient called back regarding cough syrup - pt is wetting her clothes she has coughed so bad - her bladder has previously prolapsed due to a cough- she is trying to avoid this - [phone]

## 2019-08-28 NOTE — Telephone Encounter (Signed)
Spoke with the pt  She is asking for a refill on her prometh/cod cough syrup  Last filled 08/01/19 #200 ml Please advise thanks

## 2019-08-28 NOTE — Telephone Encounter (Signed)
Prescription last issued 08/01/19, patient last seen by Dr. Delton Coombes 06/04/19. I called the patient to make her aware her request was received and forwarded to Dr. Delton Coombes. I told her it was sent yesterday and he is working at [facility] location today and that because it is a controlled medication, he would have to be the one to approve the medication. Patient voiced understanding, nothing further needed at this time.

## 2019-08-29 NOTE — Telephone Encounter (Signed)
[phone] pt calling back again really needs something

## 2019-08-30 ENCOUNTER — Ambulatory Visit (INDEPENDENT_AMBULATORY_CARE_PROVIDER_SITE_OTHER): Payer: Medicare PPO | Admitting: Internal Medicine

## 2019-08-30 ENCOUNTER — Encounter: Payer: Self-pay | Admitting: Internal Medicine

## 2019-08-30 ENCOUNTER — Other Ambulatory Visit: Payer: Self-pay

## 2019-08-30 DIAGNOSIS — R05 Cough: Secondary | ICD-10-CM | POA: Diagnosis not present

## 2019-08-30 DIAGNOSIS — R059 Cough, unspecified: Secondary | ICD-10-CM

## 2019-08-30 MED ORDER — PROMETHAZINE-DM 6.25-15 MG/5ML PO SYRP: 5.0000 mL | ORAL_SOLUTION | Freq: Four times a day (QID) | ORAL | 0 refills | Status: DC | PRN

## 2019-08-30 NOTE — Assessment & Plan Note (Signed)
Recently saw pulmonary she was diagnosed with upper airway irritation, probable mild asthma She was started on Symbicort, which she is taking She is on pantoprazole and denies GERD She does have some drainage-Benadryl makes her feel funny.  Advised taking Claritin daily Possible viral infection.  Covid is negative She would like prescription cough syrup.  I discussed with her she cannot have any additional cough syrup with codeine-she has had multiple prescriptions of this over the past few months.  I will give her 1 prescription of promethazine-DM, but advised she will need to use over-the-counter cough syrup if needed Tessalon Perles give her headache No indication for an antibiotic Continue allergy regimen Advised to follow-up with pulmonary if needed Advised to call if her symptoms do not improve

## 2019-08-30 NOTE — Progress Notes (Signed)
Virtual Visit via Video Note  I connected with Kathleen Solis on 08/30/19 at 10:45 AM EST by a video enabled telemedicine application and verified that I am speaking with the correct person using two identifiers.   I discussed the limitations of evaluation and management by telemedicine and the availability of in person appointments. The patient expressed understanding and agreed to proceed.  Present for the visit:  Myself, Dr Cheryll Cockayne, Kathleen Solis.  The patient is currently at home and I am in the office.    No referring provider.    History of Present Illness: She is here for an acute visit for cough.   Her symptoms started about 1 week ago.  Her cough restarted.  She states a dry cough, nasal congestion and headaches.  She has not had any fevers or chills, ear pain, sinus pain or sore throat.  She is not having any shortness of breath, wheezing or chest pain.  She denies GI symptoms.  She did have a Covid test and it is negative.  She is using her Symbicort daily as prescribed.  She is taking her proton pump inhibitor daily.  She denies any GERD.  She does have some drainage.  She is requesting prescription cough syrup.  She has requested this from pulmonary already.   She did try Benadryl, but she states it made her feel funny.  Review of Systems  Constitutional: Negative for chills and fever.  HENT: Positive for congestion. Negative for ear pain, sinus pain and sore throat.   Respiratory: Positive for cough (dry). Negative for sputum production, shortness of breath and wheezing.   Cardiovascular: Negative for chest pain.  Gastrointestinal: Negative for abdominal pain, diarrhea, heartburn and nausea.  Neurological: Positive for headaches.     Social History   Socioeconomic History  . Marital status: Single    Spouse name: Not on file  . Number of children: 2  . Years of education: Not on file  . Highest education level: Associate degree: academic program   Occupational History  . Occupation: disabled  Tobacco Use  . Smoking status: Never Smoker  . Smokeless tobacco: Never Used  Substance and Sexual Activity  . Alcohol use: No  . Drug use: No  . Sexual activity: Not Currently    Birth control/protection: Surgical  Other Topics Concern  . Not on file  Social History Narrative   No regular exercise      Pt is right handed, she occasionally drinks tea, walks QOD. She lives with her 53 yr old son, he has mild cerebral palsy.   Social Determinants of Health   Financial Resource Strain:   . Difficulty of Paying Living Expenses: Not on file  Food Insecurity:   . Worried About Programme researcher, broadcasting/film/video in the Last Year: Not on file  . Ran Out of Food in the Last Year: Not on file  Transportation Needs:   . Lack of Transportation (Medical): Not on file  . Lack of Transportation (Non-Medical): Not on file  Physical Activity:   . Days of Exercise per Week: Not on file  . Minutes of Exercise per Session: Not on file  Stress:   . Feeling of Stress : Not on file  Social Connections:   . Frequency of Communication with Friends and Family: Not on file  . Frequency of Social Gatherings with Friends and Family: Not on file  . Attends Religious Services: Not on file  . Active Member of Clubs or Organizations: Not on  file  . Attends Archivist Meetings: Not on file  . Marital Status: Not on file     Observations/Objective: Appears well in NAD Breathing normally, speaking full sentences  Assessment and Plan:  See Problem List for Assessment and Plan of chronic medical problems.   Follow Up Instructions:    I discussed the assessment and treatment plan with the patient. The patient was provided an opportunity to ask questions and all were answered. The patient agreed with the plan and demonstrated an understanding of the instructions.   The patient was advised to call back or seek an in-person evaluation if the symptoms worsen or  if the condition fails to improve as anticipated.    Binnie Rail, MD

## 2019-09-02 MED ORDER — PROMETHAZINE-CODEINE 6.25-10 MG/5ML PO SYRP: 5.0000 mL | ORAL_SOLUTION | Freq: Four times a day (QID) | ORAL | 0 refills | Status: DC | PRN

## 2019-09-02 NOTE — Telephone Encounter (Signed)
Called and spoke to pt. Pt aware once answered by Dr. Delton Coombes then we will call her back.

## 2019-09-02 NOTE — Telephone Encounter (Signed)
I sent in the script for the cough syrup. Please let her know.

## 2019-09-02 NOTE — Telephone Encounter (Signed)
Spoke with the pt and notified this was sent

## 2019-09-11 ENCOUNTER — Ambulatory Visit: Payer: Medicare Other | Admitting: Emergency Medicine

## 2019-10-01 NOTE — Progress Notes (Signed)
Virtual Visit via Video Note  I connected with Kathleen Solis on 10/02/19 at  9:00 AM EST by a video enabled telemedicine application and verified that I am speaking with the correct person using two identifiers.   I discussed the limitations of evaluation and management by telemedicine and the availability of in person appointments. The patient expressed understanding and agreed to proceed.  Present for the visit:  Myself, Dr Cheryll Cockayne, Kathleen Solis.  The patient is currently at home and I am in the office.    No referring provider.    History of Present Illness: She is here for follow up of her chronic medical conditions.   She is not exercising regularly.    She is not feeling well.  She has a lot of congestion and cough. She has had a sore throat. She feels achy.  She has taken tylenol.  She is using the Symbicort twice daily.   She also has back pain for two days.    Hypertension: She is taking her medication daily. She is compliant with a low sodium diet.  She does not monitor her blood pressure at home.    Hyperlipidemia: She is taking her medication daily. She is compliant with a low fat/cholesterol diet. She denies myalgias.   Hyperglycemia:  She is compliant with a low sugar/carbohydrate diet.       Review of Systems  Constitutional: Negative for chills and fever.  HENT: Positive for congestion and sore throat. Negative for ear pain.   Respiratory: Positive for cough. Negative for shortness of breath and wheezing.   Cardiovascular: Negative for chest pain, palpitations and leg swelling.  Gastrointestinal: Negative for abdominal pain, diarrhea, heartburn and nausea.  Musculoskeletal: Positive for back pain and myalgias.  Neurological: Negative for dizziness and headaches.      Social History   Socioeconomic History  . Marital status: Single    Spouse name: Not on file  . Number of children: 2  . Years of education: Not on file  . Highest education level:  Associate degree: academic program  Occupational History  . Occupation: disabled  Tobacco Use  . Smoking status: Never Smoker  . Smokeless tobacco: Never Used  Substance and Sexual Activity  . Alcohol use: No  . Drug use: No  . Sexual activity: Not Currently    Birth control/protection: Surgical  Other Topics Concern  . Not on file  Social History Narrative   No regular exercise      Pt is right handed, she occasionally drinks tea, walks QOD. She lives with her 75 yr old son, he has mild cerebral palsy.   Social Determinants of Health   Financial Resource Strain:   . Difficulty of Paying Living Expenses: Not on file  Food Insecurity:   . Worried About Programme researcher, broadcasting/film/video in the Last Year: Not on file  . Ran Out of Food in the Last Year: Not on file  Transportation Needs:   . Lack of Transportation (Medical): Not on file  . Lack of Transportation (Non-Medical): Not on file  Physical Activity:   . Days of Exercise per Week: Not on file  . Minutes of Exercise per Session: Not on file  Stress:   . Feeling of Stress : Not on file  Social Connections:   . Frequency of Communication with Friends and Family: Not on file  . Frequency of Social Gatherings with Friends and Family: Not on file  . Attends Religious Services: Not on file  .  Active Member of Clubs or Organizations: Not on file  . Attends Archivist Meetings: Not on file  . Marital Status: Not on file     Observations/Objective: Appears well in NAD Sounds congested, nasally Breathing normally, speaking full sentences Skin appears warm and dry   Assessment and Plan:  See Problem List for Assessment and Plan of chronic medical problems.   Follow Up Instructions:    I discussed the assessment and treatment plan with the patient. The patient was provided an opportunity to ask questions and all were answered. The patient agreed with the plan and demonstrated an understanding of the instructions.   The  patient was advised to call back or seek an in-person evaluation if the symptoms worsen or if the condition fails to improve as anticipated.  FU in 6 months  Binnie Rail, MD

## 2019-10-02 ENCOUNTER — Other Ambulatory Visit: Payer: Self-pay

## 2019-10-02 ENCOUNTER — Encounter: Payer: Self-pay | Admitting: Internal Medicine

## 2019-10-02 ENCOUNTER — Ambulatory Visit (INDEPENDENT_AMBULATORY_CARE_PROVIDER_SITE_OTHER): Payer: Medicare PPO | Admitting: Internal Medicine

## 2019-10-02 DIAGNOSIS — E7849 Other hyperlipidemia: Secondary | ICD-10-CM

## 2019-10-02 DIAGNOSIS — R739 Hyperglycemia, unspecified: Secondary | ICD-10-CM | POA: Diagnosis not present

## 2019-10-02 DIAGNOSIS — I10 Essential (primary) hypertension: Secondary | ICD-10-CM

## 2019-10-02 DIAGNOSIS — J069 Acute upper respiratory infection, unspecified: Secondary | ICD-10-CM

## 2019-10-02 MED ORDER — METOPROLOL SUCCINATE ER 100 MG PO TB24: 100.0000 mg | ORAL_TABLET | Freq: Every day | ORAL | 1 refills | Status: DC

## 2019-10-02 MED ORDER — HYDROCHLOROTHIAZIDE 25 MG PO TABS: ORAL_TABLET | ORAL | 1 refills | Status: DC

## 2019-10-02 NOTE — Assessment & Plan Note (Signed)
Chronic Continue atorvastatin 10 mg daily We will defer blood work since this is a Multimedia programmer visit

## 2019-10-02 NOTE — Assessment & Plan Note (Signed)
Chronic Encouraged low sugar/carbohydrate diet and regular exercise We will check A1c at her next in-person visit

## 2019-10-02 NOTE — Assessment & Plan Note (Signed)
Chronic Blood pressure has been well controlled here in the office, but she does not monitor at home Continue current medications at current doses Refill sent to pharmacy Will defer blood work for now since this is a virtual visit, but advised at some point she will need to come in for blood work since it has been a while

## 2019-10-02 NOTE — Assessment & Plan Note (Addendum)
Acute Experiencing cough, nasal congestion, sore throat This is a recurring issue for her She is taking Tylenol Advised over-the-counter cold medications such as Flonase, saline nasal spray, Mucinex, Coricidin cold products Advised to call if there is no improvement She has been tested for Covid multiple times and at this time I do not think it is necessary unless symptoms worsen

## 2019-10-04 ENCOUNTER — Encounter: Payer: Self-pay | Admitting: Emergency Medicine

## 2019-10-04 ENCOUNTER — Telehealth (INDEPENDENT_AMBULATORY_CARE_PROVIDER_SITE_OTHER): Payer: Medicare PPO | Admitting: Emergency Medicine

## 2019-10-04 DIAGNOSIS — J309 Allergic rhinitis, unspecified: Secondary | ICD-10-CM

## 2019-10-04 MED ORDER — PROMETHAZINE-DM 6.25-15 MG/5ML PO SYRP: 5.0000 mL | ORAL_SOLUTION | Freq: Four times a day (QID) | ORAL | 0 refills | Status: DC | PRN

## 2019-10-04 NOTE — Progress Notes (Signed)
Virtual Visit via Video Note  I connected with Kathleen Solis on 10/04/19 at  1:30 PM EST by a video enabled telemedicine application and verified that I am speaking with the correct person using two identifiers.  Location: Patient: Home Provider: Office    I discussed the limitations of evaluation and management by telemedicine and the availability of in person appointments. The patient expressed understanding and agreed to proceed.  History of Present Illness: Follow-up visit for Kathleen Solis, 54 year old woman, never smoker who has chronic cough with influences of both GERD and allergic rhinitis.  She also has some lower airways disease and evidence for mild intermittent asthma by spirometry.  She believes she got clinical benefit from Symbicort and we restarted it last visit.  She remains on Flonase, Protonix 40 mg daily.  She has been using Phenergan with codeine cough syrup as needed - refilled it since last time.    Observations/Objective: She is having more head congestion over the last week, ? As the weather has gotten colder. Increased cough along with it. She tells me that her Symbicort is not preferred with her new [employer] insurance > needs to be changed. She does not feel any reflux, remains on protonix.   Assessment and Plan: Start singulair  Phenergan-DM q6h prn Symbicort, plan to change to alternative when we know her [facility] formulary  Allergy referral for skin testing   Follow Up Instructions: 6 weeks    I discussed the assessment and treatment plan with the patient. The patient was provided an opportunity to ask questions and all were answered. The patient agreed with the plan and demonstrated an understanding of the instructions.   The patient was advised to call back or seek an in-person evaluation if the symptoms worsen or if the condition fails to improve as anticipated.  I provided 20 minutes of non-face-to-face time during this encounter.   Leslye Peer,  MD

## 2019-10-11 ENCOUNTER — Telehealth: Payer: Self-pay | Admitting: Internal Medicine

## 2019-10-11 NOTE — Telephone Encounter (Signed)
   Patient made a decision to go to Urgent Care Advised patient to return call to scheduler if she would like to schedule with Respirator Clinic

## 2019-10-11 NOTE — Telephone Encounter (Signed)
Can you call pt to schedule with the respiratory clinic.

## 2019-10-11 NOTE — Telephone Encounter (Signed)
   Patient states cough is worse since 02/10 visit. Requesting  RX cough medication   Please advise

## 2019-10-14 ENCOUNTER — Telehealth: Payer: Self-pay | Admitting: Emergency Medicine

## 2019-10-14 MED ORDER — PROMETHAZINE-DM 6.25-15 MG/5ML PO SYRP: 5.0000 mL | ORAL_SOLUTION | Freq: Four times a day (QID) | ORAL | 0 refills | Status: DC | PRN

## 2019-10-14 NOTE — Telephone Encounter (Signed)
Spoke with the pt and notified of recs per Dr Delton Coombes  She verbalized understanding  She states that she is having trouble with formulary list and finding alternative for Symbicort  I have scheduled her appt with pharm clinic for this

## 2019-10-14 NOTE — Telephone Encounter (Signed)
OK to fill x 1. Let her know that this is the last script, will transition her to something else going forward.

## 2019-10-14 NOTE — Telephone Encounter (Signed)
Called and spoke to pt. Pt is requesting a refill of the Promethazine-DM, pt states she ran out this weekend. Med last filled on 10/04/2019 after video visit, rx for . Pt using [facility] on [address].   Dr. Delton Coombes please advise on refill. Thanks.

## 2019-10-16 ENCOUNTER — Ambulatory Visit: Payer: Medicare PPO

## 2019-10-16 ENCOUNTER — Other Ambulatory Visit: Payer: Self-pay

## 2019-10-16 ENCOUNTER — Telehealth: Payer: Self-pay | Admitting: Pharmacy Technician

## 2019-10-16 NOTE — Telephone Encounter (Signed)
Findings of benefits investigation via test claims at [facility]:   Insurance: [place] Medicare Part D   Patient's plan prefers Symbicort and Merchant navy officer  Symbicort - # 1 inhaler for a 1 month supply through patient's insurance is $ 9.20. Plan preferred  Dulera and Advair HFA - # 1 inhaler for a 1 month supply through patient's insurance is $ 9.20. Both are non-preferred.  Test claim reveals that patient had a $445 deductible that began at the beginning of the year. Deductible has been met.  3:46 PM Beatriz Chancellor, CPhT

## 2019-10-16 NOTE — Telephone Encounter (Signed)
Called patient, she read letter that she received from [facility] and the letter states that the generic was filled by the pharmacy and the plan prefers Symbicort. Advised patient that brand Symbicort has a $9.20. She is fine with that copay, will call office if she has any other issues with pharmacy.  Called [facility], advised patient's plan prefers brand Symbicort. Tech ran claim, she was able to get a paid claim and will fill today for patient.  4:38 PM Kathleen Solis, CPhT

## 2019-10-17 ENCOUNTER — Encounter: Payer: Self-pay | Admitting: Internal Medicine

## 2019-10-17 ENCOUNTER — Ambulatory Visit (INDEPENDENT_AMBULATORY_CARE_PROVIDER_SITE_OTHER): Payer: Medicare PPO | Admitting: Internal Medicine

## 2019-10-17 DIAGNOSIS — R05 Cough: Secondary | ICD-10-CM

## 2019-10-17 DIAGNOSIS — R059 Cough, unspecified: Secondary | ICD-10-CM

## 2019-10-17 NOTE — Progress Notes (Signed)
Virtual Visit via Video Note  I connected with Kathleen Solis on 10/17/19 at  9:40 AM EST by a video enabled telemedicine application and verified that I am speaking with the correct person using two identifiers.  The patient and the provider were at separate locations throughout the entire encounter.   I discussed the limitations of evaluation and management by telemedicine and the availability of in person appointments. The patient expressed understanding and agreed to proceed. The patient and the provider were the only parties present for the visit unless noted in HPI below.  History of Present Illness: The patient is a 54 y.o. female with visit for cough. Started a week or more ago. Had virtual visit with PCP about 1 week ago and advised to take otc allergy medicine. She is taking flonase. She then had virtual visit with pulmonary and prescribed cough medicine which was then refilled 2 days later due to being out. She did not get covid-19 test. Patient states that this is worsening. She is getting some pain with coughing due to coughing so much. Denies SOB. Denies fevers but having some chills in the last 2 days. Denies muscle aches. Having some headaches during this which is not usual for her. She was recommended by PCP to see respiratory clinic to further assess but she has not done that yet. Denies missing symbicort which she takes chronically. Pulmonary referred her to allergy and she will see them next month. Overall it is worsening.   Observations/Objective: Appearance: normal, breathing appears normal, some cough non-productive during visit, no dyspnea, casual grooming, abdomen does not appear distended, throat not well visualized, memory normal, mental status is A and O times 3  Assessment and Plan: See problem oriented charting  Follow Up Instructions: needs to be assessed at respiratory clinic  I discussed the assessment and treatment plan with the patient. The patient was provided an  opportunity to ask questions and all were answered. The patient agreed with the plan and demonstrated an understanding of the instructions.   The patient was advised to call back or seek an in-person evaluation if the symptoms worsen or if the condition fails to improve as anticipated.  Myrlene Broker, MD

## 2019-10-17 NOTE — Assessment & Plan Note (Signed)
Needs to be seen and assessed at respiratory clinic. She is advised to continue symbicort and cough syrup until that time.

## 2019-10-18 ENCOUNTER — Ambulatory Visit (INDEPENDENT_AMBULATORY_CARE_PROVIDER_SITE_OTHER): Payer: Medicare PPO | Admitting: Family Medicine

## 2019-10-18 ENCOUNTER — Other Ambulatory Visit: Payer: Self-pay

## 2019-10-18 VITALS — BP 138/82 | HR 61 | Temp 98.4°F | Resp 16 | Ht 65.75 in | Wt 208.4 lb

## 2019-10-18 DIAGNOSIS — J309 Allergic rhinitis, unspecified: Secondary | ICD-10-CM

## 2019-10-18 DIAGNOSIS — R05 Cough: Secondary | ICD-10-CM | POA: Diagnosis not present

## 2019-10-18 DIAGNOSIS — J069 Acute upper respiratory infection, unspecified: Secondary | ICD-10-CM

## 2019-10-18 DIAGNOSIS — J029 Acute pharyngitis, unspecified: Secondary | ICD-10-CM | POA: Diagnosis not present

## 2019-10-18 DIAGNOSIS — R059 Cough, unspecified: Secondary | ICD-10-CM

## 2019-10-18 MED ORDER — BENZONATATE 100 MG PO CAPS: 200.0000 mg | ORAL_CAPSULE | Freq: Two times a day (BID) | ORAL | 0 refills | Status: AC | PRN

## 2019-10-18 MED ORDER — BENZONATATE 100 MG PO CAPS: 200.0000 mg | ORAL_CAPSULE | Freq: Two times a day (BID) | ORAL | 0 refills | Status: DC | PRN

## 2019-10-18 NOTE — Progress Notes (Signed)
ACUTE RESPIRATORY CLINIC VISIT   HPI:  Chief Complaint  Patient presents with  . Cough    KathleenDelise Solis is a 54 y.o. female with hx of HTN,GERD, and allergies who is here today complaining of a week of respiratory symptoms, concerned about serious infectious process.  Sore throat, exacerbated by coughing spells. She is concerned about strep throat. "Not felling well." Promethazine-Dextromethorpan  is not helping much with cough. Cough is keeping her from sleep.  Denies fever,anosmia,ageusia, CP,wheezing , or SOB. + Chills,fatigue,and body aches (back pain).Hx of back pain. Negative for stridor and dysphagia. She has not noted heartburn or acid reflux. Problem is stable.  Reporting Hx of asthma. She is on Symbicort 160-4.5 mcg and Albuterol inh. She has not used Albuterol inh in a while.  + Nasal congestion,ear fullness sensation, and post nasal drainage.  She has not noted rhinorrhea. Allergic rhinitis,she is on Flonase nasal spray.  Negative for anosmia or ageusia.  No sick contact or recent travel.  Review of Systems  Constitutional: Positive for fatigue. Negative for activity change and appetite change.  HENT: Negative for ear pain, mouth sores, nosebleeds and sinus pain.   Eyes: Negative for discharge, redness and itching.  Gastrointestinal: Negative for abdominal pain, nausea and vomiting.       No changes in bowel habits.   Skin: Negative for rash.  Allergic/Immunologic: Positive for environmental allergies.  Neurological: Negative for syncope, weakness and headaches.  Hematological: Negative for adenopathy. Does not bruise/bleed easily.  Rest see pertinent positives and negatives per HPI.  Current Outpatient Medications on File Prior to Visit  Medication Sig Dispense Refill  . albuterol (VENTOLIN HFA) 108 (90 Base) MCG/ACT inhaler Inhale 2 puffs into the lungs every 6 (six) hours as needed for wheezing or shortness of breath. 6.7 g 1  .  aspirin EC 81 MG tablet Take 81 mg by mouth at bedtime.     Marland Kitchen atorvastatin (LIPITOR) 10 MG tablet Take 1 tablet (10 mg total) by mouth at bedtime. 90 tablet 1  . budesonide-formoterol (SYMBICORT) 160-4.5 MCG/ACT inhaler Inhale 2 puffs into the lungs 2 (two) times daily. 1 Inhaler 5  . fluticasone (FLONASE) 50 MCG/ACT nasal spray Place 1 spray into both nostrils daily. 16 g 2  . hydrochlorothiazide (HYDRODIURIL) 25 MG tablet Take 1 tablet by mouth once daily. . 90 tablet 1  . metoprolol succinate (TOPROL-XL) 100 MG 24 hr tablet Take 1 tablet (100 mg total) by mouth at bedtime. 90 tablet 1  . promethazine-dextromethorphan (PROMETHAZINE-DM) 6.25-15 MG/5ML syrup Take 5 mLs by mouth every 6 (six) hours as needed for cough. 140 mL 0   No current facility-administered medications on file prior to visit.    Past Medical History:  Diagnosis Date  . Diverticulitis   . Hyperlipidemia    diet controlled - no med  . Hypertension   . Sciatica    right  . Seasonal allergies   . Stroke [facility] 2000   several mini strokes, weakness on right arm and leg  . SVD (spontaneous vaginal delivery)    x 2   Allergies  Allergen Reactions  . Darvon [Propoxyphene] Other (See Comments)    Gi problems  . Ivp Dye [Iodinated Diagnostic Agents]   . Morphine And Related Itching  . Prednisone     Make her head hurt  . Sulfa Antibiotics Hives  . Tussionex Pennkinetic Er [Hydrocod Polst-Cpm Polst Er]     Nausea, vomiting  . Buprenorphine Hcl Itching  .  Prochlorperazine Edisylate Itching and Other (See Comments)    "Acts weird: Hallucinations"    Social History   Socioeconomic History  . Marital status: Single    Spouse name: Not on file  . Number of children: 2  . Years of education: Not on file  . Highest education level: Associate degree: academic program  Occupational History  . Occupation: disabled  Tobacco Use  . Smoking status: Never Smoker  . Smokeless tobacco: Never Used  Substance and Sexual  Activity  . Alcohol use: No  . Drug use: No  . Sexual activity: Not Currently    Birth control/protection: Surgical  Other Topics Concern  . Not on file  Social History Narrative   No regular exercise      Pt is right handed, she occasionally drinks tea, walks QOD. She lives with her 64 yr old son, he has mild cerebral palsy.   Social Determinants of Health   Financial Resource Strain:   . Difficulty of Paying Living Expenses: Not on file  Food Insecurity:   . Worried About Charity fundraiser in the Last Year: Not on file  . Ran Out of Food in the Last Year: Not on file  Transportation Needs:   . Lack of Transportation (Medical): Not on file  . Lack of Transportation (Non-Medical): Not on file  Physical Activity:   . Days of Exercise per Week: Not on file  . Minutes of Exercise per Session: Not on file  Stress:   . Feeling of Stress : Not on file  Social Connections:   . Frequency of Communication with Friends and Family: Not on file  . Frequency of Social Gatherings with Friends and Family: Not on file  . Attends Religious Services: Not on file  . Active Member of Clubs or Organizations: Not on file  . Attends Archivist Meetings: Not on file  . Marital Status: Not on file    Vitals:   10/18/19 1743  BP: 138/82  Pulse: 61  Resp: 16  Temp: 98.4 F (36.9 C)  SpO2: 97%   Body mass index is 33.89 kg/m.  Physical Exam  Nursing note and vitals reviewed. Constitutional: She is oriented to person, place, and time. She appears well-developed. She does not appear ill. No distress.  HENT:  Head: Normocephalic and atraumatic.  Right Ear: Tympanic membrane, external ear and ear canal normal.  Left Ear: Tympanic membrane, external ear and ear canal normal.  Mouth/Throat: Mucous membranes are normal. Posterior oropharyngeal erythema (Minimal.) present.  Post nasal drainage.  Eyes: Conjunctivae are normal.  Cardiovascular: Normal rate and regular rhythm.  No  murmur heard. Respiratory: Effort normal and breath sounds normal. No stridor. No respiratory distress.  Musculoskeletal:     Cervical back: Full passive range of motion without pain.  Lymphadenopathy:       Head (right side): No submandibular adenopathy present.       Head (left side): No submandibular adenopathy present.    She has no cervical adenopathy.  Neurological: She is alert and oriented to person, place, and time. She has normal strength. Gait normal.  Skin: Skin is warm. No rash noted. No erythema.  Psychiatric: Her mood appears anxious.  Well groomed, good eye contact.    ASSESSMENT AND PLAN:  Kathleen Solis was seen today for cough.  Diagnoses and all orders for this visit:  URI, acute Hx and examination today do not suggest a serious process,most likely viral etiology, I explained that symptomatic treatment  is recommended in this case,I do not think abx is needed at this time. Instructed to monitor for signs of complications, including new onset of fever among some, clearly instructed about warning signs. No typical symptoms for COVID 19 infection but still screened today. F/U as needed.  Cough Lung auscultation negative for wheezing or other abnormality. I do not think CXR is needed today. I do not see asthma on problem list but reviewing records, she has followed with pulmonologist because chronic cough, mild intermittent asthma.  Albuterol may help,so recommend Albuterol inh 2 puff every 6 hours for a week then as needed for wheezing or shortness of breath. Sample and spacer given. Continue Symbicort. She is allergic to Benzonatate. Adequate hydration. OTC plain Mucinex may also help.  Allergic rhinitis, unspecified seasonality, unspecified trigger Some of her symptoms could be caused by allergies. No changes in Flonase nasal spray.  Sore throat Findings on examination do not suggest strep infection. Strep Cx collected. Gargles with saline and OTC throat  lozenges recommended for now.   Return in about 1 week (around 10/25/2019) for PCP.    Emoree Sasaki G. [place], MD  [facility]. [place] office.

## 2019-10-18 NOTE — Patient Instructions (Addendum)
A few things to remember from today's visit:   URI, acute - Plan: Culture, Group A Strep, Novel Coronavirus, NAA ([facility])  Cough - Plan: benzonatate (TESSALON) 100 MG capsule  viral infections are self-limited and we treat each symptom depending of severity.  Over the counter medications as decongestants and cold medications usually help, they need to be taken with caution if there is a history of high blood pressure or palpitations. Tylenol and/or Ibuprofen also helps with most symptoms (headache, muscle aching, fever,etc) Plenty of fluids. Honey helps with cough. Steam inhalations helps with runny nose, nasal congestion, and may prevent sinus infections. Cough and nasal congestion could last a few days and sometimes weeks.   Albuterol inh 2 puff every 6 hours for a week then as needed for wheezing or shortness of breath.    Quarantine for now. Monitor for fever. COVID and strep cx pending.

## 2019-10-21 ENCOUNTER — Telehealth: Payer: Self-pay | Admitting: Internal Medicine

## 2019-10-21 ENCOUNTER — Encounter: Payer: Self-pay | Admitting: Family Medicine

## 2019-10-21 LAB — NOVEL CORONAVIRUS, NAA: SARS-CoV-2, NAA: NOT DETECTED

## 2019-10-21 LAB — CULTURE, GROUP A STREP: Strep A Culture: NEGATIVE

## 2019-10-21 LAB — SPECIMEN STATUS REPORT

## 2019-10-21 NOTE — Telephone Encounter (Signed)
Please advise on cough syrup

## 2019-10-21 NOTE — Telephone Encounter (Signed)
Needs to try something otc

## 2019-10-21 NOTE — Telephone Encounter (Signed)
New message:   Pt is calling and states she would like to get her results from labs on 10/18/19. I did explain to the patient that the doctor probably has not reviewed these labs. Patient also states she would like to know if some cough medicine can be called in for her due to having a bad cough over the weekend.

## 2019-10-21 NOTE — Telephone Encounter (Signed)
Sent mychart message

## 2019-10-24 ENCOUNTER — Ambulatory Visit: Payer: Self-pay | Admitting: Allergy

## 2019-10-30 ENCOUNTER — Encounter: Payer: Self-pay | Admitting: Neurology

## 2019-10-30 NOTE — Progress Notes (Signed)
Virtual Visit via Video Note The purpose of this virtual visit is to provide medical care while limiting exposure to the novel coronavirus.    Consent was obtained for video visit:  Yes.   Answered questions that patient had about telehealth interaction:  Yes.   I discussed the limitations, risks, security and privacy concerns of performing an evaluation and management service by telemedicine. I also discussed with the patient that there may be a patient responsible charge related to this service. The patient expressed understanding and agreed to proceed.  Pt location: Home Physician Location: office Name of referring provider:  Binnie Rail, MD I connected with Kathleen Solis at patients initiation/request on 10/31/2019 at  9:50 AM EST by video enabled telemedicine application and verified that I am speaking with the correct person using two identifiers. Pt MRN:  [id number] Pt DOB:  [birth year] Video Participants:  Kathleen Solis   History of Present Illness:  Kathleen Solis is a 54 year old right-handed woman with hypertension, hyperlipidemia, carotid artery disease, tension type headache and history of stroke who follows up for headache  UPDATE: In September, she reported return of headaches, which were persistent, started with taking a Medrol dose pak.  However, it settled down after a few days.   Headaches have been mild and infrequent.  She was recently diagnosed with asthma.  She also had surgery for prolapsed bladder.  She reports sleeping is still poor.  Intensity:  6/10 Duration:  Takes the Tylenol and lays down and falls asleep.   Frequency:  daily Current NSAIDS:  ASA 81mg  daily Current analgesics:  Takes Extra-Strength Tylenol for the headaches. Current triptans:  none Current ergotamine:  none Current anti-emetic:  none Current muscle relaxants:  none Current anti-anxiolytic:  none Current sleep aide:  none Current Antihypertensive medications:  Toprol XL;  HCTZ Current Antidepressant medications:  none Current Anticonvulsant medications:  none Current anti-CGRP:  none Current Vitamins/Herbal/Supplements:  none Current Antihistamines/Decongestants:  Flonase Other therapy:  rest   HISTORY: In 2001, she had a stroke while pregnant with her son. She was told it was due to elevated blood pressure. She presented with slurred speech and right leg weakness. As a result, her son was born with cerebral palsy. She has been followed by multiple neurologists. Since the stroke, she has been on ASA 81mg  daily. Non-contrast MRI brain report from 02/15/14 demonstrated minimal scattered T2 hyperintensities in the left periventricular white matter regions suggesting chronic microvascular degenerative disease but no acute infarcts, mass lesions or demyelinating disease. This was also noted on prior MRI from 2012.  She presented to [facility] on 11/24/15 for acute onset right sided headache and numbness with trouble walking. CT of head was personally reviewed and revealed no acute findings. MRI of brain was ordered but she left AMA. She followed up with neurologist, Dr. Chales Salmon the following month. MRI of brain with and without contrast performed on 01/19/16 again demonstrated patchy nonspecific cerebral white matter signal changes in left frontal lobe and periatrial white matter, possibly indicative of remote ischemic stroke. Abnormal flow in the left cervical ICA noted as well. Carotid doppler from 03/16/16 reportedly showed no hemodynamically significant stenosis.A repeat MRI of the brain from 11/30/16 was personally reviewed to evaluate tinnitus in the right ear and again demonstrated chronic white matter changes, left greater than right, in watershed distribution, but no acute findings.  She was treated for episodic tension type headaches as well.The are moderate intensity non-throbbing headache on the top  of her head. They last 15 to  20 minutes and occur daily. There are no associated symptoms such as nausea, photophobia, phonophobia or unilateral numbness or weakness.She was always hesitant about starting a preventative. She treated them with ibuprofen until she stopped because it was found to be the cause of her tinnitus.  She was previously on atorvastatin 40mg  but stopped due to leg pain.  She has been on disability since 2007. She continues to have right leg weakness and trouble with speech. She has memory deficits. She is overall independent. On one occasion, she got lost while driving on familiar route. She usually uses a GPS. Since her stroke, she has insomnia. She previously had a sleep study that demonstrated mild OSA not requiring CPAP. She denies depression.  Past Medical History: Past Medical History:  Diagnosis Date  . Diverticulitis   . Hyperlipidemia    diet controlled - no med  . Hypertension   . Sciatica    right  . Seasonal allergies   . Stroke [facility]) 2000   several mini strokes, weakness on right arm and leg  . SVD (spontaneous vaginal delivery)    x 2    Medications: Outpatient Encounter Medications as of 10/31/2019  Medication Sig  . albuterol (VENTOLIN HFA) 108 (90 Base) MCG/ACT inhaler Inhale 2 puffs into the lungs every 6 (six) hours as needed for wheezing or shortness of breath.  12/31/2019 aspirin EC 81 MG tablet Take 81 mg by mouth at bedtime.   Marland Kitchen atorvastatin (LIPITOR) 10 MG tablet Take 1 tablet (10 mg total) by mouth at bedtime.  . budesonide-formoterol (SYMBICORT) 160-4.5 MCG/ACT inhaler Inhale 2 puffs into the lungs 2 (two) times daily.  . fluticasone (FLONASE) 50 MCG/ACT nasal spray Place 1 spray into both nostrils daily.  . hydrochlorothiazide (HYDRODIURIL) 25 MG tablet Take 1 tablet by mouth once daily. .  . metoprolol succinate (TOPROL-XL) 100 MG 24 hr tablet Take 1 tablet (100 mg total) by mouth at bedtime.   No facility-administered encounter medications on file as of  10/31/2019.    Allergies: Allergies  Allergen Reactions  . Darvon [Propoxyphene] Other (See Comments)    Gi problems  . Ivp Dye [Iodinated Diagnostic Agents]   . Morphine And Related Itching  . Prednisone     Make her head hurt  . Sulfa Antibiotics Hives  . Tussionex Pennkinetic Er [Hydrocod Polst-Cpm Polst Er]     Nausea, vomiting  . Buprenorphine Hcl Itching  . Prochlorperazine Edisylate Itching and Other (See Comments)    "Acts weird: Hallucinations"    Family History: Family History  Problem Relation Age of Onset  . Breast cancer Sister   . Diabetes Sister   . Glaucoma Sister   . Cataracts Sister   . Hypertension Sister   . Anuerysm Mother   . Pancreatic cancer Father   . Retinal detachment Brother   . Cataracts Sister   . Breast cancer Cousin   . Breast cancer Niece     Social History: Social History   Socioeconomic History  . Marital status: Single    Spouse name: Not on file  . Number of children: 2  . Years of education: Not on file  . Highest education level: Associate degree: academic program  Occupational History  . Occupation: disabled  Tobacco Use  . Smoking status: Never Smoker  . Smokeless tobacco: Never Used  Substance and Sexual Activity  . Alcohol use: No  . Drug use: No  . Sexual activity: Not Currently  Birth control/protection: Surgical  Other Topics Concern  . Not on file  Social History Narrative   No regular exercise      Pt is right handed, she occasionally drinks tea, walks QOD. She lives with her 74 yr old son, he has mild cerebral palsy.   Social Determinants of Health   Financial Resource Strain:   . Difficulty of Paying Living Expenses: Not on file  Food Insecurity:   . Worried About Programme researcher, broadcasting/film/video in the Last Year: Not on file  . Ran Out of Food in the Last Year: Not on file  Transportation Needs:   . Lack of Transportation (Medical): Not on file  . Lack of Transportation (Non-Medical): Not on file  Physical  Activity:   . Days of Exercise per Week: Not on file  . Minutes of Exercise per Session: Not on file  Stress:   . Feeling of Stress : Not on file  Social Connections:   . Frequency of Communication with Friends and Family: Not on file  . Frequency of Social Gatherings with Friends and Family: Not on file  . Attends Religious Services: Not on file  . Active Member of Clubs or Organizations: Not on file  . Attends Banker Meetings: Not on file  . Marital Status: Not on file  Intimate Partner Violence:   . Fear of Current or Ex-Partner: Not on file  . Emotionally Abused: Not on file  . Physically Abused: Not on file  . Sexually Abused: Not on file    Observations/Objective:   There were no vitals taken for this visit. No acute distress.  Alert and oriented.  Speech fluent and not dysarthric.  Language intact.  Eyes orthophoric on primary gaze.  Face symmetric.  Assessment and Plan:   1.  Tension-type headaches, not intractable.  Stable 2.  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  Vascular mild neurocognitive disorder. 4.  Carotid artery disease 5.  Essential hypertension 6.  Insomnia.    1.  Check carotid ultrasound. 2.  Secondary stroke prevention as managed by PCP:  ASA 81mg  daily; statin therapy (LDL goal less than 70); blood pressure control (goal less than 130/90) 3.  She continues to have poor sleep.  Remote sleep study showed mild OSA not requiring treatment with CPAP.  Now that she has been diagnosed with asthma, advised to discuss with her PCP if repeat sleep study is indicated to evaluate for worsening OSA. 4.  Follow up in one year  Follow Up Instructions:    -I discussed the assessment and treatment plan with the patient. The patient was provided an opportunity to ask questions and all were answered. The patient agreed with the plan and demonstrated an understanding of the instructions.   The  patient was advised to call back or seek an in-person evaluation if the symptoms worsen or if the condition fails to improve as anticipated.   , DO

## 2019-10-31 ENCOUNTER — Other Ambulatory Visit: Payer: Self-pay

## 2019-10-31 ENCOUNTER — Telehealth (INDEPENDENT_AMBULATORY_CARE_PROVIDER_SITE_OTHER): Payer: Medicare PPO | Admitting: Neurology

## 2019-10-31 DIAGNOSIS — I10 Essential (primary) hypertension: Secondary | ICD-10-CM | POA: Diagnosis not present

## 2019-10-31 DIAGNOSIS — G44219 Episodic tension-type headache, not intractable: Secondary | ICD-10-CM

## 2019-10-31 DIAGNOSIS — G3184 Mild cognitive impairment, so stated: Secondary | ICD-10-CM | POA: Diagnosis not present

## 2019-10-31 DIAGNOSIS — I6523 Occlusion and stenosis of bilateral carotid arteries: Secondary | ICD-10-CM

## 2019-10-31 DIAGNOSIS — I69349 Monoplegia of lower limb following cerebral infarction affecting unspecified side: Secondary | ICD-10-CM | POA: Diagnosis not present

## 2019-10-31 DIAGNOSIS — E785 Hyperlipidemia, unspecified: Secondary | ICD-10-CM

## 2019-11-02 ENCOUNTER — Other Ambulatory Visit: Payer: Self-pay

## 2019-11-02 ENCOUNTER — Emergency Department (HOSPITAL_COMMUNITY): Payer: Medicare PPO

## 2019-11-02 ENCOUNTER — Emergency Department (HOSPITAL_COMMUNITY)
Admission: EM | Admit: 2019-11-02 | Discharge: 2019-11-02 | Disposition: A | Discharge status: Home / Self Care | Payer: Medicare PPO | Attending: Emergency Medicine | Admitting: Emergency Medicine

## 2019-11-02 DIAGNOSIS — R110 Nausea: Secondary | ICD-10-CM | POA: Diagnosis not present

## 2019-11-02 DIAGNOSIS — R1033 Periumbilical pain: Secondary | ICD-10-CM | POA: Insufficient documentation

## 2019-11-02 DIAGNOSIS — Z79899 Other long term (current) drug therapy: Secondary | ICD-10-CM | POA: Diagnosis not present

## 2019-11-02 DIAGNOSIS — I10 Essential (primary) hypertension: Secondary | ICD-10-CM | POA: Insufficient documentation

## 2019-11-02 DIAGNOSIS — K859 Acute pancreatitis without necrosis or infection, unspecified: Secondary | ICD-10-CM

## 2019-11-02 DIAGNOSIS — R1111 Vomiting without nausea: Secondary | ICD-10-CM | POA: Diagnosis not present

## 2019-11-02 LAB — COMPREHENSIVE METABOLIC PANEL
ALT: 21 U/L (ref 0–44)
AST: 21 U/L (ref 15–41)
Albumin: 4.1 g/dL (ref 3.5–5.0)
Alkaline Phosphatase: 96 U/L (ref 38–126)
Anion gap: 8 (ref 5–15)
BUN: 14 mg/dL (ref 6–20)
CO2: 31 mmol/L (ref 22–32)
Calcium: 9.2 mg/dL (ref 8.9–10.3)
Chloride: 100 mmol/L (ref 98–111)
Creatinine, Ser: 0.81 mg/dL (ref 0.44–1.00)
GFR calc Af Amer: >60 mL/min (ref >60)
GFR calc non Af Amer: >60 mL/min (ref >60)
Glucose, Bld: 116 mg/dL — ABNORMAL HIGH (ref 70–99)
Potassium: 3.4 mmol/L — ABNORMAL LOW (ref 3.5–5.1)
Sodium: 139 mmol/L (ref 135–145)
Total Bilirubin: 0.6 mg/dL (ref 0.3–1.2)
Total Protein: 7.8 g/dL (ref 6.5–8.1)

## 2019-11-02 LAB — CBC
HCT: 41.3 % (ref 36.0–46.0)
Hemoglobin: 14.4 g/dL (ref 12.0–15.0)
MCH: 32.1 pg (ref 26.0–34.0)
MCHC: 34.9 g/dL (ref 30.0–36.0)
MCV: 92 fL (ref 80.0–100.0)
Platelets: 252 10*3/uL (ref 150–400)
RBC: 4.49 MIL/uL (ref 3.87–5.11)
RDW: 14 % (ref 11.5–15.5)
WBC: 7.2 10*3/uL (ref 4.0–10.5)
nRBC: 0 % (ref 0.0–0.2)

## 2019-11-02 LAB — URINALYSIS, ROUTINE W REFLEX MICROSCOPIC
Bilirubin Urine: NEGATIVE
Glucose, UA: NEGATIVE mg/dL
Hgb urine dipstick: NEGATIVE
Ketones, ur: NEGATIVE mg/dL
Nitrite: NEGATIVE
Protein, ur: NEGATIVE mg/dL
Specific Gravity, Urine: 1.006 (ref 1.005–1.030)
pH: 7 (ref 5.0–8.0)

## 2019-11-02 LAB — LIPASE, BLOOD: Lipase: 29 U/L (ref 11–51)

## 2019-11-02 MED ORDER — SUCRALFATE 1 G PO TABS: 1.0000 g | ORAL_TABLET | Freq: Three times a day (TID) | ORAL | 0 refills | Status: DC

## 2019-11-02 MED ORDER — HYDROMORPHONE HCL 1 MG/ML IJ SOLN
1.0000 mg | Freq: Once | INTRAMUSCULAR | Status: AC
  Administered 2019-11-02: 1 mg via INTRAVENOUS
  Filled 2019-11-02: qty 1

## 2019-11-02 MED ORDER — PROMETHAZINE HCL 25 MG PO TABS: 25.0000 mg | ORAL_TABLET | Freq: Four times a day (QID) | ORAL | 0 refills | Status: DC | PRN

## 2019-11-02 MED ORDER — SODIUM CHLORIDE 0.9 % IV BOLUS
1000.0000 mL | Freq: Once | INTRAVENOUS | Status: AC
  Administered 2019-11-02: 1000 mL via INTRAVENOUS

## 2019-11-02 MED ORDER — ONDANSETRON HCL 4 MG/2ML IJ SOLN
4.0000 mg | Freq: Once | INTRAMUSCULAR | Status: AC
  Administered 2019-11-02: 4 mg via INTRAVENOUS
  Filled 2019-11-02: qty 2

## 2019-11-02 MED ORDER — SODIUM CHLORIDE 0.9% FLUSH: 3.0000 mL | Freq: Once | INTRAVENOUS | Status: DC

## 2019-11-02 MED ORDER — TRAMADOL HCL 50 MG PO TABS: 50.0000 mg | ORAL_TABLET | Freq: Four times a day (QID) | ORAL | 0 refills | Status: DC | PRN

## 2019-11-02 MED ORDER — NITROFURANTOIN MONOHYD MACRO 100 MG PO CAPS: 100.0000 mg | ORAL_CAPSULE | Freq: Two times a day (BID) | ORAL | 0 refills | Status: DC

## 2019-11-02 NOTE — Discharge Instructions (Signed)
Follow-up with your primary care doctor.  Return here as needed.  Follow a bland diet over the next 48 hours.  Slowly increase your fluid intake.

## 2019-11-02 NOTE — ED Provider Notes (Signed)
[facility]-EMERGENCY DEPT Provider Note   CSN: [id number] Arrival date & time: 11/02/19  0744     History Chief Complaint  Patient presents with  . Abdominal Pain    Kathleen Solis is a 54 y.o. female.  HPI Patient presents to the emergency department with mid abdominal pain that started several days ago but worsened yesterday.  The patient states that he has had pain in that region that shoots down to the front part of her abdomen into her back.  Patient states that she did not take any medications prior to arrival for her symptoms.  She states she has had pancreatitis in the past and this feels similar.  The patient states that she had her gallbladder and appendix removed.  The patient states that she is also had nausea but no significant vomiting.  The patient denies chest pain, shortness of breath, headache,blurred vision, neck pain, fever, cough, weakness, numbness, dizziness, anorexia, edema,  vomiting, diarrhea, rash, back pain, dysuria, hematemesis, bloody stool, near syncope, or syncope.    Past Medical History:  Diagnosis Date  . Diverticulitis   . Hyperlipidemia    diet controlled - no med  . Hypertension   . Sciatica    right  . Seasonal allergies   . Stroke [facility]) 2000   several mini strokes, weakness on right arm and leg  . SVD (spontaneous vaginal delivery)    x 2    Patient Active Problem List   Diagnosis Date Noted  . Sinus infection 03/22/2019  . Exposure to COVID-19 virus 03/17/2019  . Cough 03/17/2019  . Suspected 2019 novel coronavirus infection 02/27/2019  . Abdominal pain 09/12/2018  . Lumbar radiculopathy 05/15/2018  . Lumbar back pain 05/10/2018  . B12 deficiency 02/23/2018  . Tingling 02/21/2018  . Onychomycosis 02/21/2018  . Left ovarian cyst 10/04/2017  . Nausea 06/07/2017  . Dark stools 06/07/2017  . Hyperlipidemia 02/14/2017  . Chest pain 02/14/2017  . Hyperglycemia 02/14/2017  . Tinnitus of right ear 11/16/2016    . URI (upper respiratory infection) 11/09/2016  . Constipation 09/13/2016  . Great toe pain, left 08/16/2016  . Allergic rhinitis 08/16/2016  . Carotid arterial disease (HCC) 04/10/2016  . Diverticulitis of colon without hemorrhage 02/25/2016  . Bunion of left foot 02/08/2016  . Bone spur of right foot 02/08/2016  . Leg neuralgia, right 02/08/2016  . Episodic tension type headache 01/12/2016  . Benign essential HTN 03/03/2015  . History of cerebrovascular accident with residual deficit 03/03/2015    Past Surgical History:  Procedure Laterality Date  . ABDOMINAL HYSTERECTOMY     partial  . ANAL FISSURE REPAIR    . APPENDECTOMY    . CHOLECYSTECTOMY    . CYSTOSCOPY N/A 10/04/2017   Procedure: CYSTOSCOPY;  Surgeon: Gerald Leitz, MD;  Location: [facility];  Service: Gynecology;  Laterality: N/A;  . FOOT SURGERY Left    big toe bunion removed  . LAPAROSCOPIC BILATERAL SALPINGO OOPHERECTOMY Bilateral 10/04/2017   Procedure: LAPAROSCOPIC BILATERAL SALPINGO OOPHORECTOMY, PELVIC WASHINGS;  Surgeon: Gerald Leitz, MD;  Location: [facility];  Service: Gynecology;  Laterality: Bilateral;  . TUBAL LIGATION    . WISDOM TOOTH EXTRACTION       OB History    Gravida  4   Para  2   Term  2   Preterm      AB  2   Living  2     SAB  2   TAB      Ectopic  Multiple      Live Births  2           Family History  Problem Relation Age of Onset  . Breast cancer Sister   . Diabetes Sister   . Glaucoma Sister   . Cataracts Sister   . Hypertension Sister   . Anuerysm Mother   . Pancreatic cancer Father   . Retinal detachment Brother   . Cataracts Sister   . Breast cancer Cousin   . Breast cancer Niece     Social History   Tobacco Use  . Smoking status: Never Smoker  . Smokeless tobacco: Never Used  Substance Use Topics  . Alcohol use: No  . Drug use: No    Home Medications Prior to Admission medications   Medication Sig Start Date End Date Taking? Authorizing Provider   albuterol (VENTOLIN HFA) 108 (90 Base) MCG/ACT inhaler Inhale 2 puffs into the lungs every 6 (six) hours as needed for wheezing or shortness of breath. 04/08/19   Pincus Sanes, MD  aspirin EC 81 MG tablet Take 81 mg by mouth at bedtime.     [provider]  atorvastatin (LIPITOR) 10 MG tablet Take 1 tablet (10 mg total) by mouth at bedtime. 08/09/19   Pincus Sanes, MD  budesonide-formoterol (SYMBICORT) 160-4.5 MCG/ACT inhaler Inhale 2 puffs into the lungs 2 (two) times daily. 08/20/19   Leslye Peer, MD  fluticasone (FLONASE) 50 MCG/ACT nasal spray Place 1 spray into both nostrils daily. 05/03/19   Glenford Bayley, NP  hydrochlorothiazide (HYDRODIURIL) 25 MG tablet Take 1 tablet by mouth once daily. . 10/02/19   Pincus Sanes, MD  metoprolol succinate (TOPROL-XL) 100 MG 24 hr tablet Take 1 tablet (100 mg total) by mouth at bedtime. 10/02/19   Pincus Sanes, MD    Allergies    Darvon [propoxyphene], Ivp dye [iodinated diagnostic agents], Morphine and related, Prednisone, Sulfa antibiotics, Tussionex pennkinetic er [hydrocod polst-cpm polst er], Buprenorphine hcl, and Prochlorperazine edisylate  Review of Systems   Review of Systems All other systems negative except as documented in the HPI. All pertinent positives and negatives as reviewed in the HPI. Physical Exam Updated Vital Signs BP 124/86 (BP Location: Left Arm)   Pulse 63   Temp 98.2 F (36.8 C) (Oral)   Resp 16   Ht 5\' 5"  (1.651 m)   Wt 94.5 kg   SpO2 95%   BMI 34.68 kg/m   Physical Exam Vitals and nursing note reviewed.  Constitutional:      General: She is not in acute distress.    Appearance: She is well-developed.  HENT:     Head: Normocephalic and atraumatic.  Eyes:     Pupils: Pupils are equal, round, and reactive to light.  Cardiovascular:     Rate and Rhythm: Normal rate and regular rhythm.     Heart sounds: Normal heart sounds. No murmur. No friction rub. No gallop.   Pulmonary:     Effort:  Pulmonary effort is normal. No respiratory distress.     Breath sounds: Normal breath sounds. No wheezing.  Abdominal:     General: Bowel sounds are normal. There is no distension.     Palpations: Abdomen is soft.     Tenderness: There is abdominal tenderness in the epigastric area and periumbilical area.  Musculoskeletal:     Cervical back: Normal range of motion and neck supple.  Skin:    General: Skin is warm and dry.  Capillary Refill: Capillary refill takes less than 2 seconds.     Findings: No erythema or rash.  Neurological:     Mental Status: She is alert and oriented to person, place, and time.     Motor: No abnormal muscle tone.     Coordination: Coordination normal.  Psychiatric:        Behavior: Behavior normal.     ED Results / Procedures / Treatments   Labs (all labs ordered are listed, but only abnormal results are displayed) Labs Reviewed  LIPASE, BLOOD  COMPREHENSIVE METABOLIC PANEL  CBC  URINALYSIS, ROUTINE W REFLEX MICROSCOPIC    EKG None  Radiology No results found.  Procedures Procedures (including critical care time)  Medications Ordered in ED Medications  ondansetron (ZOFRAN) injection 4 mg (has no administration in time range)  sodium chloride 0.9 % bolus 1,000 mL (has no administration in time range)    ED Course  I have reviewed the triage vital signs and the nursing notes.  Pertinent labs & imaging results that were available during my care of the patient were reviewed by me and considered in my medical decision making (see chart for details). The patient is advised that we will do laboratory testing along with CT scan imaging.  Have advised her we will give her IV fluids and antiemetics.  Patient is advised to have nothing by mouth at this point.  All questions were answered up until this point as well.  Discussed CT scan findings along with laboratory test findings with the patient.  I did advise her that this still could represent  her pancreas and that she needed to remind diet over the next 48 hours.  Patient is advised to return for any worsening in her condition.  The patient was able to tolerate ginger ale by mouth.  Patient is advised to follow-up with her primary doctor and told to return here as needed.   MDM Rules/Calculators/A&P                       Final Clinical Impression(s) / ED Diagnoses Final diagnoses:  None    Rx / DC Orders ED Discharge Orders    None       Dalia Heading, PA-C 11/02/19 1243    Malvin Johns, MD 11/02/19 1341

## 2019-11-02 NOTE — ED Triage Notes (Signed)
Patient reports abd pain , points toward umbilicus region, says pain is shooting straight through toward back. Patient denies injury. Pain described as shooting, 7/10. Patient reports nausea, no vomiting, no diarrhea. States she feels her throat is clogged up.

## 2019-11-08 ENCOUNTER — Ambulatory Visit: Payer: Medicare PPO | Attending: Internal Medicine

## 2019-11-08 DIAGNOSIS — Z20822 Contact with and (suspected) exposure to covid-19: Secondary | ICD-10-CM

## 2019-11-09 LAB — NOVEL CORONAVIRUS, NAA: SARS-CoV-2, NAA: NOT DETECTED

## 2019-11-13 ENCOUNTER — Ambulatory Visit: Payer: Self-pay | Admitting: Allergy

## 2019-11-25 ENCOUNTER — Encounter: Payer: Self-pay | Admitting: Internal Medicine

## 2019-11-25 ENCOUNTER — Ambulatory Visit (INDEPENDENT_AMBULATORY_CARE_PROVIDER_SITE_OTHER): Payer: Medicare PPO | Admitting: Internal Medicine

## 2019-11-25 DIAGNOSIS — A084 Viral intestinal infection, unspecified: Secondary | ICD-10-CM | POA: Diagnosis not present

## 2019-11-25 MED ORDER — PROMETHAZINE HCL 25 MG PO TABS: 25.0000 mg | ORAL_TABLET | Freq: Four times a day (QID) | ORAL | 0 refills | Status: DC | PRN

## 2019-11-25 NOTE — Assessment & Plan Note (Signed)
Acute, started yesterday Nausea, vomiting, diarrhea Likely viral gastroenteritis Discussed increased fluids, bland diet Promethazine sent to pharmacy Call if no improvement

## 2019-11-25 NOTE — Progress Notes (Signed)
Virtual Visit via Video Note  I connected with Kathleen Solis on 11/25/19 at  9:30 AM EDT by a video enabled telemedicine application and verified that I am speaking with the correct person using two identifiers.   I discussed the limitations of evaluation and management by telemedicine and the availability of in person appointments. The patient expressed understanding and agreed to proceed.  Present for the visit:  Myself, Dr Cheryll Cockayne, Kathleen Solis.  The patient is currently at home and I am in the office.    No referring provider.    History of Present Illness: This is an acute visit for nausea and diarrhea.  She was sick all weekend.  She had non-bloody diarrhea all day yesterday.  She had several episodes, > 5. She was nauseous all day. This morning she started with non-bloody vomiting.  She has eaten minimal amount.  She has been drinking plenty of water.  She denies abdominal pain.  She feels sweaty, but denies fever.    She denies sick contacts.  .    Ct scan 3/13 was unremarkable - this was for a separate illness.    Review of Systems  Constitutional: Positive for diaphoresis. Negative for fever.  HENT: Positive for sore throat (? allergy related).   Respiratory: Negative for shortness of breath.   Cardiovascular: Negative for chest pain.  Gastrointestinal: Positive for diarrhea, nausea and vomiting. Negative for abdominal pain and blood in stool.  Genitourinary: Negative for dysuria, frequency and hematuria.  Musculoskeletal: Negative for myalgias.  Neurological: Positive for headaches.       A little lightheadedness      Social History   Socioeconomic History  . Marital status: Single    Spouse name: Not on file  . Number of children: 2  . Years of education: Not on file  . Highest education level: Associate degree: academic program  Occupational History  . Occupation: disabled  Tobacco Use  . Smoking status: Never Smoker  . Smokeless tobacco: Never Used   Substance and Sexual Activity  . Alcohol use: No  . Drug use: No  . Sexual activity: Not Currently    Birth control/protection: Surgical  Other Topics Concern  . Not on file  Social History Narrative   No regular exercise      Pt is right handed, she occasionally drinks tea, walks QOD. She lives with her 35 yr old son, he has mild cerebral palsy.   Social Determinants of Health   Financial Resource Strain:   . Difficulty of Paying Living Expenses:   Food Insecurity:   . Worried About Programme researcher, broadcasting/film/video in the Last Year:   . Barista in the Last Year:   Transportation Needs:   . Freight forwarder (Medical):   Marland Kitchen Lack of Transportation (Non-Medical):   Physical Activity:   . Days of Exercise per Week:   . Minutes of Exercise per Session:   Stress:   . Feeling of Stress :   Social Connections:   . Frequency of Communication with Friends and Family:   . Frequency of Social Gatherings with Friends and Family:   . Attends Religious Services:   . Active Member of Clubs or Organizations:   . Attends Banker Meetings:   Marland Kitchen Marital Status:      Observations/Objective: Appears well in NAD Breathing normally Skin is warm and dry  Assessment and Plan:  See Problem List for Assessment and Plan of chronic medical problems.  Follow Up Instructions:    I discussed the assessment and treatment plan with the patient. The patient was provided an opportunity to ask questions and all were answered. The patient agreed with the plan and demonstrated an understanding of the instructions.   The patient was advised to call back or seek an in-person evaluation if the symptoms worsen or if the condition fails to improve as anticipated.    Binnie Rail, MD

## 2019-12-04 ENCOUNTER — Encounter: Payer: Self-pay | Admitting: Internal Medicine

## 2019-12-04 ENCOUNTER — Ambulatory Visit (INDEPENDENT_AMBULATORY_CARE_PROVIDER_SITE_OTHER): Payer: Medicare PPO | Admitting: Internal Medicine

## 2019-12-04 DIAGNOSIS — B9789 Other viral agents as the cause of diseases classified elsewhere: Secondary | ICD-10-CM | POA: Insufficient documentation

## 2019-12-04 DIAGNOSIS — J988 Other specified respiratory disorders: Secondary | ICD-10-CM | POA: Insufficient documentation

## 2019-12-04 DIAGNOSIS — B349 Viral infection, unspecified: Secondary | ICD-10-CM | POA: Diagnosis not present

## 2019-12-04 HISTORY — DX: Other viral agents as the cause of diseases classified elsewhere: B97.89

## 2019-12-04 NOTE — Assessment & Plan Note (Signed)
Acute Symptoms consistent with possible viral illness Started 2 days ago No concerning symptoms Discussed that the treatment is over-the-counter symptomatic treatment only Advised trying Advil or Aleve-as long as she does not take it long-term she probably can take it for a few doses without causing tinnitus and this may help more than the Tylenol Increase rest and fluids Over-the-counter cough medication or throat lozenges

## 2019-12-04 NOTE — Progress Notes (Signed)
Virtual Visit via Video Note  I connected with Kathleen Solis on 12/04/19 at  3:15 PM EDT by a video enabled telemedicine application and verified that I am speaking with the correct person using two identifiers.   I discussed the limitations of evaluation and management by telemedicine and the availability of in person appointments. The patient expressed understanding and agreed to proceed.  Present for the visit:  Myself, Dr Billey Gosling, Kathleen Solis.  The patient is currently at home and I am in the office.    No referring provider.    History of Present Illness: She is here for an acute visit for cough.  2 days ago she started to feel very achy like her bones were aching all over, generalized weakness, she felt like her cough is trying to come back and she was having headaches.  Her throat is sore on the right side.  She has been sweating some more.  She denies other symptoms.  Initially she felt bad and then she felt better and now she feels bad again.  She is starting to lose her voice.  She has taken Tylenol Extra Strength, but has not helped much.  She did take Motrin in the past, but it caused tinnitus.  The cough is just nonstop and she is not sure what to do.      Review of Systems  Constitutional: Positive for diaphoresis. Negative for chills and fever.  HENT: Positive for sore throat (on right side). Negative for congestion, ear pain and sinus pain.   Respiratory: Positive for cough.   Gastrointestinal: Negative for abdominal pain, diarrhea and nausea.  Musculoskeletal: Positive for myalgias.  Neurological: Positive for headaches.      Social History   Socioeconomic History  . Marital status: Single    Spouse name: Not on file  . Number of children: 2  . Years of education: Not on file  . Highest education level: Associate degree: academic program  Occupational History  . Occupation: disabled  Tobacco Use  . Smoking status: Never Smoker  . Smokeless tobacco:  Never Used  Substance and Sexual Activity  . Alcohol use: No  . Drug use: No  . Sexual activity: Not Currently    Birth control/protection: Surgical  Other Topics Concern  . Not on file  Social History Narrative   No regular exercise      Pt is right handed, she occasionally drinks tea, walks QOD. She lives with her 82 yr old son, he has mild cerebral palsy.   Social Determinants of Health   Financial Resource Strain:   . Difficulty of Paying Living Expenses:   Food Insecurity:   . Worried About Charity fundraiser in the Last Year:   . Arboriculturist in the Last Year:   Transportation Needs:   . Film/video editor (Medical):   Marland Kitchen Lack of Transportation (Non-Medical):   Physical Activity:   . Days of Exercise per Week:   . Minutes of Exercise per Session:   Stress:   . Feeling of Stress :   Social Connections:   . Frequency of Communication with Friends and Family:   . Frequency of Social Gatherings with Friends and Family:   . Attends Religious Services:   . Active Member of Clubs or Organizations:   . Attends Archivist Meetings:   Marland Kitchen Marital Status:      Observations/Objective: Appears well in NAD Breathing normally, speaking in full sentences without difficulty No coughing  throughout visit Skin appears warm and dry  Assessment and Plan:  See Problem List for Assessment and Plan of chronic medical problems.   Follow Up Instructions:    I discussed the assessment and treatment plan with the patient. The patient was provided an opportunity to ask questions and all were answered. The patient agreed with the plan and demonstrated an understanding of the instructions.   The patient was advised to call back or seek an in-person evaluation if the symptoms worsen or if the condition fails to improve as anticipated.    Pincus Sanes, MD

## 2019-12-07 ENCOUNTER — Encounter: Payer: Self-pay | Admitting: Internal Medicine

## 2019-12-07 DIAGNOSIS — K219 Gastro-esophageal reflux disease without esophagitis: Secondary | ICD-10-CM | POA: Diagnosis not present

## 2019-12-07 DIAGNOSIS — E785 Hyperlipidemia, unspecified: Secondary | ICD-10-CM | POA: Diagnosis not present

## 2019-12-07 DIAGNOSIS — E669 Obesity, unspecified: Secondary | ICD-10-CM | POA: Diagnosis not present

## 2019-12-07 DIAGNOSIS — Z882 Allergy status to sulfonamides status: Secondary | ICD-10-CM | POA: Diagnosis not present

## 2019-12-07 DIAGNOSIS — Z8673 Personal history of transient ischemic attack (TIA), and cerebral infarction without residual deficits: Secondary | ICD-10-CM | POA: Diagnosis not present

## 2019-12-07 DIAGNOSIS — K5900 Constipation, unspecified: Secondary | ICD-10-CM | POA: Diagnosis not present

## 2019-12-07 DIAGNOSIS — Z7951 Long term (current) use of inhaled steroids: Secondary | ICD-10-CM | POA: Diagnosis not present

## 2019-12-07 DIAGNOSIS — J45909 Unspecified asthma, uncomplicated: Secondary | ICD-10-CM | POA: Diagnosis not present

## 2019-12-07 DIAGNOSIS — I10 Essential (primary) hypertension: Secondary | ICD-10-CM | POA: Diagnosis not present

## 2019-12-07 DIAGNOSIS — R32 Unspecified urinary incontinence: Secondary | ICD-10-CM | POA: Diagnosis not present

## 2019-12-07 DIAGNOSIS — Z803 Family history of malignant neoplasm of breast: Secondary | ICD-10-CM | POA: Diagnosis not present

## 2019-12-11 ENCOUNTER — Ambulatory Visit: Payer: Self-pay | Admitting: Allergy

## 2019-12-26 ENCOUNTER — Telehealth: Payer: Self-pay

## 2019-12-26 NOTE — Telephone Encounter (Signed)
New message    The patient calling c/o sick on stomach asking can Dr. Lawerance Bach calls anything  going on all week not getting any better, Nauseous,   No emergency room   No Urgent Care   [facility] [address] [place], [place] [address]

## 2019-12-27 MED ORDER — FAMOTIDINE 40 MG PO TABS: 40.0000 mg | ORAL_TABLET | Freq: Every day | ORAL | 5 refills | Status: DC

## 2019-12-27 NOTE — Telephone Encounter (Signed)
Concern for possible reflux - hard to say w/o an appt.    Start pepcid 40 mg daily - sent to [facility].

## 2019-12-27 NOTE — Telephone Encounter (Signed)
Pt states that she is on carafate and does not feel like it is acid reflux. She is not feeling the burning coming up feeling. She states that she has the nausea feeling in her stomach. She states that you gave her promethazine before and it helped.

## 2019-12-28 MED ORDER — PROMETHAZINE HCL 25 MG PO TABS: 25.0000 mg | ORAL_TABLET | Freq: Three times a day (TID) | ORAL | 0 refills | Status: DC | PRN

## 2019-12-28 NOTE — Telephone Encounter (Signed)
rx sent - short term rx only

## 2020-01-28 ENCOUNTER — Encounter: Payer: Self-pay | Admitting: Internal Medicine

## 2020-01-28 ENCOUNTER — Telehealth (INDEPENDENT_AMBULATORY_CARE_PROVIDER_SITE_OTHER): Payer: Medicare PPO | Admitting: Internal Medicine

## 2020-01-28 DIAGNOSIS — H60501 Unspecified acute noninfective otitis externa, right ear: Secondary | ICD-10-CM

## 2020-01-28 DIAGNOSIS — H6091 Unspecified otitis externa, right ear: Secondary | ICD-10-CM | POA: Insufficient documentation

## 2020-01-28 MED ORDER — AMOXICILLIN-POT CLAVULANATE 875-125 MG PO TABS: 1.0000 | ORAL_TABLET | Freq: Two times a day (BID) | ORAL | 0 refills | Status: DC

## 2020-01-28 NOTE — Progress Notes (Signed)
Virtual Visit via Video Note  I connected with Kathleen Solis on 01/28/20 at 10:45 AM EDT by a video enabled telemedicine application and verified that I am speaking with the correct person using two identifiers.   I discussed the limitations of evaluation and management by telemedicine and the availability of in person appointments. The patient expressed understanding and agreed to proceed.  Present for the visit:  Myself, Dr Billey Gosling, Kathleen Solis.  The patient is currently at home and I am in the office.    No referring provider.    History of Present Illness: She is here for an acute visit for cold symptoms.   Her symptoms started a few days ago  She is experiencing right ear pain-even the outside of the ear is tender.  She did have some discharge and bleeding from the ear.  The right side of her throat is sore.  She still has a cough, but that is somewhat chronic.  She had diarrhea 2 days ago.  She has had some mild headaches.  She denies fever, sinus pain, shortness of breath and wheezing.  She is not able to lay on the right side because of the ear pain.  She denies any swimming or getting water in the ear.   Review of Systems  Constitutional: Negative for fever.  HENT: Positive for ear discharge (bleeding and pus), ear pain (r ear - external ear) and sore throat (right side). Negative for hearing loss and sinus pain.   Respiratory: Positive for cough. Negative for shortness of breath and wheezing.   Gastrointestinal: Positive for diarrhea (two days ago).  Musculoskeletal: Negative for myalgias.  Neurological: Positive for headaches (mild). Negative for dizziness.      Social History   Socioeconomic History  . Marital status: Single    Spouse name: Not on file  . Number of children: 2  . Years of education: Not on file  . Highest education level: Associate degree: academic program  Occupational History  . Occupation: disabled  Tobacco Use  . Smoking status:  Never Smoker  . Smokeless tobacco: Never Used  Substance and Sexual Activity  . Alcohol use: No  . Drug use: No  . Sexual activity: Not Currently    Birth control/protection: Surgical  Other Topics Concern  . Not on file  Social History Narrative   No regular exercise      Pt is right handed, she occasionally drinks tea, walks QOD. She lives with her 32 yr old son, he has mild cerebral palsy.   Social Determinants of Health   Financial Resource Strain:   . Difficulty of Paying Living Expenses:   Food Insecurity:   . Worried About Charity fundraiser in the Last Year:   . Arboriculturist in the Last Year:   Transportation Needs:   . Film/video editor (Medical):   Marland Kitchen Lack of Transportation (Non-Medical):   Physical Activity:   . Days of Exercise per Week:   . Minutes of Exercise per Session:   Stress:   . Feeling of Stress :   Social Connections:   . Frequency of Communication with Friends and Family:   . Frequency of Social Gatherings with Friends and Family:   . Attends Religious Services:   . Active Member of Clubs or Organizations:   . Attends Archivist Meetings:   Marland Kitchen Marital Status:      Observations/Objective: Appears well in NAD   Assessment and Plan:  See Problem  List for Assessment and Plan of chronic medical problems.   Follow Up Instructions:    I discussed the assessment and treatment plan with the patient. The patient was provided an opportunity to ask questions and all were answered. The patient agreed with the plan and demonstrated an understanding of the instructions.   The patient was advised to call back or seek an in-person evaluation if the symptoms worsen or if the condition fails to improve as anticipated.    Pincus Sanes, MD

## 2020-01-28 NOTE — Assessment & Plan Note (Signed)
Acute Her symptoms are consistent with possible right-sided otitis externa.  We are very limited since this is a virtual visit and I am not able to look in her ear.  Unable to rule out otitis media.  She may have both Given that I am unsure if there is an inner ear infection or not I will go ahead and treat with oral antibiotics Start Augmentin twice daily x10 days Tylenol as needed for pain If her symptoms do not improve or worsen she will need to be seen in person either here or at urgent care

## 2020-01-29 DIAGNOSIS — R05 Cough: Secondary | ICD-10-CM | POA: Diagnosis not present

## 2020-01-29 DIAGNOSIS — S00411A Abrasion of right ear, initial encounter: Secondary | ICD-10-CM | POA: Diagnosis not present

## 2020-02-05 ENCOUNTER — Ambulatory Visit: Payer: Medicare PPO | Attending: Internal Medicine

## 2020-02-05 DIAGNOSIS — Z20822 Contact with and (suspected) exposure to covid-19: Secondary | ICD-10-CM

## 2020-02-06 LAB — SARS-COV-2, NAA 2 DAY TAT

## 2020-02-06 LAB — NOVEL CORONAVIRUS, NAA: SARS-CoV-2, NAA: NOT DETECTED

## 2020-02-08 ENCOUNTER — Emergency Department (HOSPITAL_COMMUNITY)
Admission: EM | Admit: 2020-02-08 | Discharge: 2020-02-08 | Disposition: A | Discharge status: Home / Self Care | Payer: Medicare PPO | Attending: Emergency Medicine | Admitting: Emergency Medicine

## 2020-02-08 ENCOUNTER — Other Ambulatory Visit: Payer: Self-pay

## 2020-02-08 ENCOUNTER — Emergency Department (HOSPITAL_COMMUNITY): Payer: Medicare PPO

## 2020-02-08 ENCOUNTER — Encounter (HOSPITAL_COMMUNITY): Payer: Self-pay | Admitting: *Deleted

## 2020-02-08 DIAGNOSIS — Z5321 Procedure and treatment not carried out due to patient leaving prior to being seen by health care provider: Secondary | ICD-10-CM | POA: Insufficient documentation

## 2020-02-08 DIAGNOSIS — R05 Cough: Secondary | ICD-10-CM | POA: Diagnosis not present

## 2020-02-08 DIAGNOSIS — R509 Fever, unspecified: Secondary | ICD-10-CM | POA: Insufficient documentation

## 2020-02-08 NOTE — ED Notes (Signed)
Pt told registration she was going to go to [facility]

## 2020-02-08 NOTE — ED Triage Notes (Signed)
Reports cough for about 2 weeks nonproductive, soreness in chest from cough, has had fever this week, feels tired.

## 2020-02-14 ENCOUNTER — Telehealth: Payer: Self-pay | Admitting: Internal Medicine

## 2020-02-14 NOTE — Telephone Encounter (Signed)
Spoke with patient and info given 

## 2020-02-14 NOTE — Telephone Encounter (Signed)
I would recommend otc mucinex or cough syrup

## 2020-02-14 NOTE — Telephone Encounter (Signed)
New Message:   Pt is calling to see if Dr. Lawerance Bach can prescribe her some cough medicine due to her still having a cough. She uses the [facility] [address] [place], [place] - [address]. Please advise.

## 2020-02-21 ENCOUNTER — Encounter: Payer: Self-pay | Admitting: Internal Medicine

## 2020-02-21 ENCOUNTER — Telehealth (INDEPENDENT_AMBULATORY_CARE_PROVIDER_SITE_OTHER): Payer: Medicare PPO | Admitting: Internal Medicine

## 2020-02-21 DIAGNOSIS — R05 Cough: Secondary | ICD-10-CM

## 2020-02-21 DIAGNOSIS — R059 Cough, unspecified: Secondary | ICD-10-CM

## 2020-02-21 MED ORDER — LEVOCETIRIZINE DIHYDROCHLORIDE 5 MG PO TABS: 5.0000 mg | ORAL_TABLET | Freq: Every evening | ORAL | 5 refills | Status: DC

## 2020-02-21 MED ORDER — PROMETHAZINE-CODEINE 6.25-10 MG/5ML PO SOLN: 5.0000 mL | Freq: Four times a day (QID) | ORAL | 0 refills | Status: DC | PRN

## 2020-02-21 NOTE — Assessment & Plan Note (Signed)
Subacute She is a recurrent cough probably related to multiple factors I do not think she has an active infection and will not prescribe an antibiotic ?  Allergy related, versus uncontrolled asthma She states her GERD is controlled-continue famotidine Continue Symbicort.  I did advise that she follow-up with pulmonary to make sure her asthma is well controlled Continue Singulair Start Xyzal in the evenings to help with drainage I will give her a prescription cough syrup to help for over the weekend, but she understands I will not continue to prescribe this we need to get to with causing the problem with the cough and treat that not treat her cough symptomatically

## 2020-02-21 NOTE — Progress Notes (Signed)
Virtual Visit via Video Note  I connected with Kathleen Solis on 02/21/20 at  3:30 PM EDT by a video enabled telemedicine application and verified that I am speaking with the correct person using two identifiers.   I discussed the limitations of evaluation and management by telemedicine and the availability of in person appointments. The patient expressed understanding and agreed to proceed.  Present for the visit:  Myself, Dr Cheryll Cockayne, Kathleen Solis.  The patient is currently at home and I am in the office.    No referring provider.    History of Present Illness: She is here for an acute visit for cold symptoms.   Her symptoms started about one month ago  She is experiencing PND, cough - primarily dry.    She has tried taking halls cough drops, mucinex  I had a virtual visit with her one month ago and prescribed augmentin - at that time she was having ear pain and there was concern over an ear infection -the antibiotic did help the ear, but her cough continued.  She ended up going to urgent care the next weekend.  A chest x-ray at that time was negative.  She was prescribed cough syrup and advised to finish the antibiotic she was on.  She states she has not gotten better since then.  She did vomit because she was coughing so much.  She is using her Symbicort and taking her Singulair.  Over-the-counter cold medications have not helped.   Review of Systems  Constitutional: Negative for chills and fever.  HENT: Negative for congestion and sore throat.        Positive PND  Respiratory: Positive for cough. Negative for sputum production, shortness of breath and wheezing.   Gastrointestinal: Negative for diarrhea and nausea.  Musculoskeletal: Negative for myalgias.  Neurological: Negative for headaches.      Social History   Socioeconomic History  . Marital status: Single    Spouse name: Not on file  . Number of children: 2  . Years of education: Not on file  . Highest  education level: Associate degree: academic program  Occupational History  . Occupation: disabled  Tobacco Use  . Smoking status: Never Smoker  . Smokeless tobacco: Never Used  Vaping Use  . Vaping Use: Never used  Substance and Sexual Activity  . Alcohol use: No  . Drug use: No  . Sexual activity: Not Currently    Birth control/protection: Surgical  Other Topics Concern  . Not on file  Social History Narrative   No regular exercise      Pt is right handed, she occasionally drinks tea, walks QOD. She lives with her 23 yr old son, he has mild cerebral palsy.   Social Determinants of Health   Financial Resource Strain:   . Difficulty of Paying Living Expenses:   Food Insecurity:   . Worried About Programme researcher, broadcasting/film/video in the Last Year:   . Barista in the Last Year:   Transportation Needs:   . Freight forwarder (Medical):   Marland Kitchen Lack of Transportation (Non-Medical):   Physical Activity:   . Days of Exercise per Week:   . Minutes of Exercise per Session:   Stress:   . Feeling of Stress :   Social Connections:   . Frequency of Communication with Friends and Family:   . Frequency of Social Gatherings with Friends and Family:   . Attends Religious Services:   . Active Member of Clubs  or Organizations:   . Attends Banker Meetings:   Marland Kitchen Marital Status:      Observations/Objective: Appears well in NAD Sounds congested, clearing throat frequently Breathing normally and speaking in full sentences Skin appears warm and dry  Assessment and Plan:  See Problem List for Assessment and Plan of chronic medical problems.   Follow Up Instructions:    I discussed the assessment and treatment plan with the patient. The patient was provided an opportunity to ask questions and all were answered. The patient agreed with the plan and demonstrated an understanding of the instructions.   The patient was advised to call back or seek an in-person evaluation if the  symptoms worsen or if the condition fails to improve as anticipated.    Pincus Sanes, MD

## 2020-03-10 MED ORDER — MIDAZOLAM HCL 2 MG/2ML IJ SOLN
INTRAMUSCULAR | Status: AC
  Filled 2020-03-10: qty 2

## 2020-03-10 MED ORDER — ROCURONIUM BROMIDE 10 MG/ML (PF) SYRINGE
PREFILLED_SYRINGE | INTRAVENOUS | Status: AC
  Filled 2020-03-10: qty 10

## 2020-03-10 MED ORDER — KETOROLAC TROMETHAMINE 30 MG/ML IJ SOLN
INTRAMUSCULAR | Status: AC
  Filled 2020-03-10: qty 1

## 2020-03-10 MED ORDER — GLYCOPYRROLATE PF 0.2 MG/ML IJ SOSY
PREFILLED_SYRINGE | INTRAMUSCULAR | Status: AC
  Filled 2020-03-10: qty 1

## 2020-03-10 MED ORDER — FENTANYL CITRATE (PF) 100 MCG/2ML IJ SOLN
INTRAMUSCULAR | Status: AC
  Filled 2020-03-10: qty 2

## 2020-03-10 MED ORDER — PROPOFOL 10 MG/ML IV BOLUS
INTRAVENOUS | Status: AC
  Filled 2020-03-10: qty 20

## 2020-03-10 MED ORDER — LIDOCAINE 2% (20 MG/ML) 5 ML SYRINGE
INTRAMUSCULAR | Status: AC
  Filled 2020-03-10: qty 5

## 2020-03-10 MED ORDER — DEXAMETHASONE SODIUM PHOSPHATE 10 MG/ML IJ SOLN
INTRAMUSCULAR | Status: AC
  Filled 2020-03-10: qty 1

## 2020-03-10 MED ORDER — ONDANSETRON HCL 4 MG/2ML IJ SOLN
INTRAMUSCULAR | Status: AC
  Filled 2020-03-10: qty 2

## 2020-03-13 ENCOUNTER — Other Ambulatory Visit: Payer: Self-pay

## 2020-03-13 ENCOUNTER — Encounter: Payer: Self-pay | Admitting: Family Medicine

## 2020-03-13 ENCOUNTER — Ambulatory Visit: Payer: Medicare PPO | Admitting: Family Medicine

## 2020-03-13 ENCOUNTER — Telehealth: Payer: Self-pay | Admitting: Internal Medicine

## 2020-03-13 VITALS — BP 110/82 | HR 59 | Temp 98.2°F | Ht 65.5 in

## 2020-03-13 DIAGNOSIS — M5431 Sciatica, right side: Secondary | ICD-10-CM | POA: Diagnosis not present

## 2020-03-13 MED ORDER — TRAMADOL HCL 50 MG PO TABS: 100.0000 mg | ORAL_TABLET | Freq: Four times a day (QID) | ORAL | 0 refills | Status: DC | PRN

## 2020-03-13 MED ORDER — PROMETHAZINE HCL 25 MG PO TABS
25.0000 mg | ORAL_TABLET | ORAL | 0 refills | Status: DC | PRN
Start: 2020-03-13 — End: 2020-05-06

## 2020-03-13 MED ORDER — CYCLOBENZAPRINE HCL 10 MG PO TABS
10.0000 mg | ORAL_TABLET | Freq: Three times a day (TID) | ORAL | 0 refills | Status: DC | PRN
Start: 2020-03-13 — End: 2020-05-05

## 2020-03-13 NOTE — Progress Notes (Signed)
   Subjective:    Patient ID: Kathleen Solis, female    DOB: October 15, 1965, 54 y.o.   MRN: [id number]  HPI Duplicate    Review of Systems     Objective:   Physical Exam        Assessment & Plan:

## 2020-03-13 NOTE — Telephone Encounter (Signed)
New message:   Pt is calling and expressing that she is have sharp pains going through her thigh and going down her leg. I have transferred the pt to [facility].

## 2020-03-13 NOTE — Progress Notes (Signed)
   Subjective:    Patient ID: Kathleen Solis, female    DOB: [birth year], 54 y.o.   MRN: [id number]  HPI Here for the sudden onset yesterday of a sharp severe pain that starts in the right lower back and which radiates down the right leg to the foot. The leg and foot sometimes go to sleep. No recent trauma. She was treated for sciatica in the past. She has taken some Tylenol with no relief.    Review of Systems  Constitutional: Negative.   Respiratory: Negative.   Cardiovascular: Negative.   Musculoskeletal: Positive for back pain.       Objective:   Physical Exam Constitutional:      Comments: In pain, limping   Cardiovascular:     Rate and Rhythm: Normal rate and regular rhythm.     Pulses: Normal pulses.     Heart sounds: Normal heart sounds.  Pulmonary:     Effort: Pulmonary effort is normal.     Breath sounds: Normal breath sounds.  Musculoskeletal:     Comments: She is quite tender over the right sided and central lower back and over the right sciatic notch. ROM is limited by pain,and SLR is positive on the right side.   Neurological:     Mental Status: She is alert.           Assessment & Plan:  Sciatica, treat with Tramadol and Flexeril. Apply moist heat. Use gentle stretches. Follow up with Dr. Lawerance Bach next week if not better.  Gershon Crane, MD

## 2020-04-09 ENCOUNTER — Telehealth: Payer: Self-pay

## 2020-04-09 NOTE — Telephone Encounter (Signed)
Yes she needs to get the covid vaccine

## 2020-04-09 NOTE — Telephone Encounter (Signed)
New message    Please advise on the COVID vaccine with her medical history would it be wise to take it or no.

## 2020-04-13 ENCOUNTER — Telehealth: Payer: Self-pay | Admitting: Internal Medicine

## 2020-04-13 NOTE — Telephone Encounter (Signed)
Should make an appt

## 2020-04-13 NOTE — Telephone Encounter (Signed)
    Patient calling to report she got Covid19 vaccine on 8/21.  She is now experiencing nausea and diarrhea. Patient seeking advice and prescription  nausea medication

## 2020-04-14 NOTE — Telephone Encounter (Signed)
   Scheduler left message [id number] to call for appointment 

## 2020-04-15 ENCOUNTER — Ambulatory Visit: Payer: Medicare PPO | Admitting: Allergy

## 2020-05-04 ENCOUNTER — Telehealth (INDEPENDENT_AMBULATORY_CARE_PROVIDER_SITE_OTHER): Payer: Medicare PPO | Admitting: Family

## 2020-05-04 ENCOUNTER — Other Ambulatory Visit: Payer: Self-pay

## 2020-05-04 DIAGNOSIS — R112 Nausea with vomiting, unspecified: Secondary | ICD-10-CM | POA: Diagnosis not present

## 2020-05-04 DIAGNOSIS — R5381 Other malaise: Secondary | ICD-10-CM | POA: Diagnosis not present

## 2020-05-04 DIAGNOSIS — R52 Pain, unspecified: Secondary | ICD-10-CM

## 2020-05-04 DIAGNOSIS — R079 Chest pain, unspecified: Secondary | ICD-10-CM | POA: Diagnosis not present

## 2020-05-04 DIAGNOSIS — R197 Diarrhea, unspecified: Secondary | ICD-10-CM

## 2020-05-04 DIAGNOSIS — R519 Headache, unspecified: Secondary | ICD-10-CM | POA: Diagnosis not present

## 2020-05-04 DIAGNOSIS — R0602 Shortness of breath: Secondary | ICD-10-CM | POA: Diagnosis not present

## 2020-05-04 MED ORDER — PROMETHAZINE HCL 6.25 MG/5ML PO SYRP
25.0000 mg | ORAL_SOLUTION | Freq: Four times a day (QID) | ORAL | 0 refills | Status: DC | PRN
Start: 2020-05-04 — End: 2020-05-11

## 2020-05-04 NOTE — Progress Notes (Signed)
Kathleen Solis is a 54 y.o. female with the following history as recorded in [employer]:  Patient Active Problem List   Diagnosis Date Noted  . Right otitis externa 01/28/2020  . Viral illness 12/04/2019  . Viral gastroenteritis 11/25/2019  . Sinus infection 03/22/2019  . Exposure to COVID-19 virus 03/17/2019  . Cough 03/17/2019  . Suspected 2019 novel coronavirus infection 02/27/2019  . Abdominal pain 09/12/2018  . Lumbar radiculopathy 05/15/2018  . Lumbar back pain 05/10/2018  . B12 deficiency 02/23/2018  . Tingling 02/21/2018  . Onychomycosis 02/21/2018  . Left ovarian cyst 10/04/2017  . Nausea 06/07/2017  . Dark stools 06/07/2017  . Hyperlipidemia 02/14/2017  . Chest pain 02/14/2017  . Hyperglycemia 02/14/2017  . Tinnitus of right ear 11/16/2016  . URI (upper respiratory infection) 11/09/2016  . Constipation 09/13/2016  . Great toe pain, left 08/16/2016  . Allergic rhinitis 08/16/2016  . Carotid arterial disease (HCC) 04/10/2016  . Diverticulitis of colon without hemorrhage 02/25/2016  . Bunion of left foot 02/08/2016  . Bone spur of right foot 02/08/2016  . Leg neuralgia, right 02/08/2016  . Episodic tension type headache 01/12/2016  . Benign essential HTN 03/03/2015  . History of cerebrovascular accident with residual deficit 03/03/2015    Current Outpatient Medications  Medication Sig Dispense Refill  . albuterol (VENTOLIN HFA) 108 (90 Base) MCG/ACT inhaler Inhale 2 puffs into the lungs every 6 (six) hours as needed for wheezing or shortness of breath. 6.7 g 1  . aspirin EC 81 MG tablet Take 81 mg by mouth at bedtime.     Marland Kitchen atorvastatin (LIPITOR) 10 MG tablet Take 1 tablet (10 mg total) by mouth at bedtime. 90 tablet 1  . budesonide-formoterol (SYMBICORT) 160-4.5 MCG/ACT inhaler Inhale 2 puffs into the lungs 2 (two) times daily. 1 Inhaler 5  . cyclobenzaprine (FLEXERIL) 10 MG tablet Take 1 tablet (10 mg total) by mouth 3 (three) times daily as needed for muscle  spasms. 60 tablet 0  . famotidine (PEPCID) 40 MG tablet Take 1 tablet (40 mg total) by mouth daily. 30 tablet 5  . fluticasone (FLONASE) 50 MCG/ACT nasal spray Place 1 spray into both nostrils daily. (Patient taking differently: Place 1 spray into both nostrils daily as needed for allergies or rhinitis. ) 16 g 2  . hydrochlorothiazide (HYDRODIURIL) 25 MG tablet Take 1 tablet by mouth once daily. . (Patient taking differently: Take 25 mg by mouth at bedtime. ) 90 tablet 1  . levocetirizine (XYZAL) 5 MG tablet Take 1 tablet (5 mg total) by mouth every evening. 30 tablet 5  . metoprolol succinate (TOPROL-XL) 100 MG 24 hr tablet Take 1 tablet (100 mg total) by mouth at bedtime. 90 tablet 1  . promethazine (PHENERGAN) 25 MG tablet Take 1 tablet (25 mg total) by mouth every 4 (four) hours as needed for nausea or vomiting. 60 tablet 0  . promethazine (PHENERGAN) 6.25 MG/5ML syrup Take 20 mLs (25 mg total) by mouth every 6 (six) hours as needed for nausea or vomiting. 120 mL 0  . Promethazine-Codeine 6.25-10 MG/5ML SOLN Take 5 mLs by mouth every 6 (six) hours as needed (cough). 180 mL 0  . sucralfate (CARAFATE) 1 g tablet Take 1 tablet (1 g total) by mouth 4 (four) times daily -  with meals and at bedtime. 30 tablet 0  . traMADol (ULTRAM) 50 MG tablet Take 2 tablets (100 mg total) by mouth every 6 (six) hours as needed for moderate pain. 60 tablet 0   No  current facility-administered medications for this visit.    Allergies: Benzonatate, Darvon [propoxyphene], Ibuprofen, Morphine and related, Prednisone, Sulfa antibiotics, Tussionex pennkinetic er [hydrocod polst-cpm polst er], Buprenorphine hcl, Ivp dye [iodinated diagnostic agents], and Prochlorperazine edisylate  Past Medical History:  Diagnosis Date  . Diverticulitis   . Hyperlipidemia    diet controlled - no med  . Hypertension   . Sciatica    right  . Seasonal allergies   . Stroke [facility]) 2000   several mini strokes, weakness on right arm and  leg  . SVD (spontaneous vaginal delivery)    x 2    Past Surgical History:  Procedure Laterality Date  . ABDOMINAL HYSTERECTOMY     partial  . ANAL FISSURE REPAIR    . APPENDECTOMY    . CHOLECYSTECTOMY    . CYSTOSCOPY N/A 10/04/2017   Procedure: CYSTOSCOPY;  Surgeon: Gerald Leitz, MD;  Location: [facility];  Service: Gynecology;  Laterality: N/A;  . FOOT SURGERY Left    big toe bunion removed  . LAPAROSCOPIC BILATERAL SALPINGO OOPHERECTOMY Bilateral 10/04/2017   Procedure: LAPAROSCOPIC BILATERAL SALPINGO OOPHORECTOMY, PELVIC WASHINGS;  Surgeon: Gerald Leitz, MD;  Location: [facility];  Service: Gynecology;  Laterality: Bilateral;  . TUBAL LIGATION    . WISDOM TOOTH EXTRACTION      Family History  Problem Relation Age of Onset  . Breast cancer Sister   . Diabetes Sister   . Glaucoma Sister   . Cataracts Sister   . Hypertension Sister   . Anuerysm Mother   . Pancreatic cancer Father   . Retinal detachment Brother   . Cataracts Sister   . Breast cancer Cousin   . Breast cancer Niece     Social History   Tobacco Use  . Smoking status: Never Smoker  . Smokeless tobacco: Never Used  Substance Use Topics  . Alcohol use: No    Subjective:   I connected with Kathleen Solis on 05/04/20 at  2:00 PM EDT by a video enabled telemedicine application and verified that I am speaking with the correct person using two identifiers.   I discussed the limitations of evaluation and management by telemedicine and the availability of in person appointments. The patient expressed understanding and agreed to proceed. Provider in office/ patient is at home; provider and patient are only 2 people on video call.   2 different concerns-  1) Has felt "bad" for the past 2 weeks with headaches/ body aches/ nausea; wants to know if she can be scheduled for in office visit with her PCP to discuss; did a rapid COVID test today which was negative;  2) Started with diarrhea yesterday afternoon; + vomiting started  today; asking for prescription of Phenergan liquid;     Objective:  There were no vitals filed for this visit.  General: Well developed, well nourished,  Lungs: Respirations unlabored;  Neurologic: Alert and oriented; speech intact; face symmetrical;   Assessment:  1. Nausea and vomiting, intractability of vomiting not specified, unspecified vomiting type   2. Diarrhea, unspecified type   3. Generalized body aches     Plan:  Patient had rapid COVID test today; Suspect due to sudden onset of diarrhea and vomiting, that she has viral GI illness; Rx for liquid Phenergan given as requested; BRAT diet; ER precautions;   Since body aches and headache have been present for 2 weeks and negative COVID test today, will tentatively schedule for in office visit with her PCP for Wednesday ( per patient request); she  would like to discuss getting updated labs; will let PCP make final decision if she is comfortable to see in office however.   No follow-ups on file.  No orders of the defined types were placed in this encounter.   Requested Prescriptions   Signed Prescriptions Disp Refills  . promethazine (PHENERGAN) 6.25 MG/5ML syrup 120 mL 0    Sig: Take 20 mLs (25 mg total) by mouth every 6 (six) hours as needed for nausea or vomiting.

## 2020-05-05 NOTE — Progress Notes (Signed)
Subjective:    Patient ID: Kathleen Solis, female    DOB: 1966/04/07, 54 y.o.   MRN: [id number]  HPI The patient is here for follow up from her virtual visit.    Had a virtual visit with laura 9/13 for not feeling well - headaches, body aches, nausea.  Did a rapid covid test 9/13 that was negative.    Her son was sick 9/3 - rapid covid test was neg.  He had a fever, N/V and diarrhea.  He is feeling better and his symptoms only lasted the day she states.    Sunday 9/5 she got sick.  She has no fever.  She has N,V, dairrhea headache, congestion, a little chest pain, weak, not eating, but drinking Gatorade and ginger ale.  She is achy in her muscles.  She had a negative rapid covid test and has a PCR pending.     She has decreased appetite which has been there for months.  She still has a decreased appetite with her current symptoms.  Her diarrhea has resolved.  She last vomited 2 days ago.  At this point she has persistent decreased appetite and nausea.  She has epigastric pain.   Had promethazine 25 mg - 60 pills on 7/23 Promethazine syrup yesterday, 01/29/20 Promethazine-codeine 02/21/20, 02/08/20, 09/02/19, 12/20 and 8 other times in 2020      Medications and allergies reviewed with patient and updated if appropriate.  Patient Active Problem List   Diagnosis Date Noted  . Right otitis externa 01/28/2020  . Viral gastroenteritis 11/25/2019  . Sinus infection 03/22/2019  . Cough 03/17/2019  . Abdominal pain 09/12/2018  . Lumbar radiculopathy 05/15/2018  . Lumbar back pain 05/10/2018  . B12 deficiency 02/23/2018  . Tingling 02/21/2018  . Onychomycosis 02/21/2018  . Left ovarian cyst 10/04/2017  . Nausea 06/07/2017  . Dark stools 06/07/2017  . Hyperlipidemia 02/14/2017  . Chest pain 02/14/2017  . Hyperglycemia 02/14/2017  . Tinnitus of right ear 11/16/2016  . URI (upper respiratory infection) 11/09/2016  . Constipation 09/13/2016  . Allergic rhinitis 08/16/2016  . Carotid  arterial disease (HCC) 04/10/2016  . Diverticulitis of colon without hemorrhage 02/25/2016  . Bunion of left foot 02/08/2016  . Bone spur of right foot 02/08/2016  . Leg neuralgia, right 02/08/2016  . Episodic tension type headache 01/12/2016  . Benign essential HTN 03/03/2015  . History of cerebrovascular accident with residual deficit 03/03/2015    Current Outpatient Medications on File Prior to Visit  Medication Sig Dispense Refill  . albuterol (VENTOLIN HFA) 108 (90 Base) MCG/ACT inhaler Inhale 2 puffs into the lungs every 6 (six) hours as needed for wheezing or shortness of breath. 6.7 g 1  . aspirin EC 81 MG tablet Take 81 mg by mouth at bedtime.     Marland Kitchen atorvastatin (LIPITOR) 10 MG tablet Take 1 tablet (10 mg total) by mouth at bedtime. 90 tablet 1  . budesonide-formoterol (SYMBICORT) 160-4.5 MCG/ACT inhaler Inhale 2 puffs into the lungs 2 (two) times daily. 1 Inhaler 5  . hydrochlorothiazide (HYDRODIURIL) 25 MG tablet Take 1 tablet by mouth once daily. . (Patient taking differently: Take 25 mg by mouth at bedtime. ) 90 tablet 1  . metoprolol succinate (TOPROL-XL) 100 MG 24 hr tablet Take 1 tablet (100 mg total) by mouth at bedtime. 90 tablet 1  . promethazine (PHENERGAN) 6.25 MG/5ML syrup Take 20 mLs (25 mg total) by mouth every 6 (six) hours as needed for nausea or vomiting. 120 mL  0   No current facility-administered medications on file prior to visit.    Past Medical History:  Diagnosis Date  . Diverticulitis   . Hyperlipidemia    diet controlled - no med  . Hypertension   . Sciatica    right  . Seasonal allergies   . Stroke [facility]) 2000   several mini strokes, weakness on right arm and leg  . SVD (spontaneous vaginal delivery)    x 2    Past Surgical History:  Procedure Laterality Date  . ABDOMINAL HYSTERECTOMY     partial  . ANAL FISSURE REPAIR    . APPENDECTOMY    . CHOLECYSTECTOMY    . CYSTOSCOPY N/A 10/04/2017   Procedure: CYSTOSCOPY;  Surgeon: Gerald Leitz, MD;   Location: [facility];  Service: Gynecology;  Laterality: N/A;  . FOOT SURGERY Left    big toe bunion removed  . LAPAROSCOPIC BILATERAL SALPINGO OOPHERECTOMY Bilateral 10/04/2017   Procedure: LAPAROSCOPIC BILATERAL SALPINGO OOPHORECTOMY, PELVIC WASHINGS;  Surgeon: Gerald Leitz, MD;  Location: [facility];  Service: Gynecology;  Laterality: Bilateral;  . TUBAL LIGATION    . WISDOM TOOTH EXTRACTION      Social History   Socioeconomic History  . Marital status: Single    Spouse name: Not on file  . Number of children: 2  . Years of education: Not on file  . Highest education level: Associate degree: academic program  Occupational History  . Occupation: disabled  Tobacco Use  . Smoking status: Never Smoker  . Smokeless tobacco: Never Used  Vaping Use  . Vaping Use: Never used  Substance and Sexual Activity  . Alcohol use: No  . Drug use: No  . Sexual activity: Not Currently    Birth control/protection: Surgical  Other Topics Concern  . Not on file  Social History Narrative   No regular exercise      Pt is right handed, she occasionally drinks tea, walks QOD. She lives with her 53 yr old son, he has mild cerebral palsy.   Social Determinants of Health   Financial Resource Strain:   . Difficulty of Paying Living Expenses: Not on file  Food Insecurity:   . Worried About Programme researcher, broadcasting/film/video in the Last Year: Not on file  . Ran Out of Food in the Last Year: Not on file  Transportation Needs:   . Lack of Transportation (Medical): Not on file  . Lack of Transportation (Non-Medical): Not on file  Physical Activity:   . Days of Exercise per Week: Not on file  . Minutes of Exercise per Session: Not on file  Stress:   . Feeling of Stress : Not on file  Social Connections:   . Frequency of Communication with Friends and Family: Not on file  . Frequency of Social Gatherings with Friends and Family: Not on file  . Attends Religious Services: Not on file  . Active Member of Clubs or  Organizations: Not on file  . Attends Banker Meetings: Not on file  . Marital Status: Not on file    Family History  Problem Relation Age of Onset  . Breast cancer Sister   . Diabetes Sister   . Glaucoma Sister   . Cataracts Sister   . Hypertension Sister   . Anuerysm Mother   . Pancreatic cancer Father   . Retinal detachment Brother   . Cataracts Sister   . Breast cancer Cousin   . Breast cancer Niece     Review of  Systems  Constitutional: Positive for appetite change and chills. Negative for fever.  HENT: Positive for congestion. Negative for sinus pain and sore throat.   Respiratory: Negative for cough and shortness of breath.   Gastrointestinal: Positive for abdominal pain, diarrhea (resolved), nausea and vomiting (two days ago). Negative for blood in stool.       No gerd  Genitourinary: Positive for frequency. Negative for dysuria and hematuria.  Musculoskeletal: Positive for myalgias (achy).  Neurological: Positive for headaches.       Objective:   Vitals:   05/06/20 1112  BP: 122/80  Pulse: 71  Temp: 98.2 F (36.8 C)  SpO2: 95%   BP Readings from Last 3 Encounters:  05/06/20 122/80  03/13/20 110/82  02/08/20 123/73   Wt Readings from Last 3 Encounters:  05/06/20 186 lb 12.8 oz (84.7 kg)  11/02/19 208 lb 6.4 oz (94.5 kg)  10/18/19 208 lb 6.4 oz (94.5 kg)   Body mass index is 30.61 kg/m.   Physical Exam    Constitutional: Appears well-developed and well-nourished. No distress.  HENT:  Head: Normocephalic and atraumatic. Ears, mouth: Normal ear canals and tympanic membranes.  Oropharynx moist and without erythema Neck: Neck supple. No tracheal deviation present. No thyromegaly present.  No cervical lymphadenopathy Cardiovascular: Normal rate, regular rhythm and normal heart sounds.   No murmur heard.   No edema Pulmonary/Chest: Effort normal and breath sounds normal. No respiratory distress. No has no wheezes. No rales. Abdomen: Soft,  nondistended, epigastric tenderness with palpation without rebound or guarding.  No tenderness elsewhere in the abdomen. Skin: Skin is warm and dry. Not diaphoretic.  Psychiatric: Normal mood and affect. Behavior is normal.      Assessment & Plan:    See Problem List for Assessment and Plan of chronic medical problems.    This visit occurred during the SARS-CoV-2 public health emergency.  Safety protocols were in place, including screening questions prior to the visit, additional usage of staff PPE, and extensive cleaning of exam room while observing appropriate contact time as indicated for disinfecting solutions.

## 2020-05-06 ENCOUNTER — Other Ambulatory Visit: Payer: Self-pay

## 2020-05-06 ENCOUNTER — Ambulatory Visit: Payer: Medicare PPO | Admitting: Internal Medicine

## 2020-05-06 ENCOUNTER — Encounter: Payer: Self-pay | Admitting: Internal Medicine

## 2020-05-06 VITALS — BP 122/80 | HR 71 | Temp 98.2°F | Wt 186.8 lb

## 2020-05-06 DIAGNOSIS — E7800 Pure hypercholesterolemia, unspecified: Secondary | ICD-10-CM

## 2020-05-06 DIAGNOSIS — E7849 Other hyperlipidemia: Secondary | ICD-10-CM

## 2020-05-06 DIAGNOSIS — R110 Nausea: Secondary | ICD-10-CM

## 2020-05-06 DIAGNOSIS — R1013 Epigastric pain: Secondary | ICD-10-CM

## 2020-05-06 DIAGNOSIS — I10 Essential (primary) hypertension: Secondary | ICD-10-CM | POA: Diagnosis not present

## 2020-05-06 DIAGNOSIS — R350 Frequency of micturition: Secondary | ICD-10-CM | POA: Diagnosis not present

## 2020-05-06 HISTORY — DX: Epigastric pain: R10.13

## 2020-05-06 MED ORDER — OMEPRAZOLE 20 MG PO CPDR
20.0000 mg | DELAYED_RELEASE_CAPSULE | Freq: Every day | ORAL | 5 refills | Status: DC
Start: 2020-05-06 — End: 2020-06-09

## 2020-05-06 MED ORDER — ATORVASTATIN CALCIUM 10 MG PO TABS: 10.0000 mg | ORAL_TABLET | Freq: Every day | ORAL | 1 refills | Status: DC

## 2020-05-06 MED ORDER — METOPROLOL SUCCINATE ER 100 MG PO TB24: 100.0000 mg | ORAL_TABLET | Freq: Every day | ORAL | 1 refills | Status: DC

## 2020-05-06 NOTE — Assessment & Plan Note (Signed)
Acute Started 10 days ago with her other symptoms, but has had several episodes of nausea Was prescribed promethazine a couple of days ago, but she has taken this a lot and I would like to avoid future prescriptions ?  GERD, related to gastroenteritis, gastritis Start omeprazole 20 mg daily Will refer to GI

## 2020-05-06 NOTE — Assessment & Plan Note (Signed)
Acute Epigastric pain started with all of her current symptoms 10 days ago Has associated nausea, headaches, myalgia weakness Had vomiting and diarrhea, but those symptoms have resolved Rapid Covid negative, PCR Covid pending ?  Atypical GERD, gastritis, gastric ulcer  Had similar symptoms a few months ago and CT scan at that time did not reveal any abnormalities Does have frequent nausea and has taken a lot of promethazine, which I would like to avoid in the future Start omeprazole 20 mg daily although she denies GERD Bland diet Will refer to GI

## 2020-05-06 NOTE — Assessment & Plan Note (Signed)
Acute States urinary frequency, but no other symptoms Will rule out UTI Urinalysis, culture ordered

## 2020-05-06 NOTE — Assessment & Plan Note (Signed)
Chronic BP well controlled Current regimen effective and well tolerated Continue current medications at current doses cmp  

## 2020-05-06 NOTE — Patient Instructions (Addendum)
  Blood work and urine test was ordered.     Medications reviewed and updated.  Changes include :   Omeprazole for your stomach.   Your prescription(s) have been submitted to your pharmacy. Please take as directed and contact our office if you believe you are having problem(s) with the medication(s).   A referral was ordered for GI.       Someone from their office will call you to schedule an appointment.     Follow up in 6 months

## 2020-05-06 NOTE — Assessment & Plan Note (Signed)
Chronic Continue statin 

## 2020-05-07 LAB — COMPLETE METABOLIC PANEL WITH GFR
AG Ratio: 1.4 (calc) (ref 1.0–2.5)
ALT: 13 U/L (ref 6–29)
AST: 18 U/L (ref 10–35)
Albumin: 4.5 g/dL (ref 3.6–5.1)
Alkaline phosphatase (APISO): 79 U/L (ref 37–153)
BUN: 12 mg/dL (ref 7–25)
CO2: 31 mmol/L (ref 20–32)
Calcium: 10 mg/dL (ref 8.6–10.4)
Chloride: 98 mmol/L (ref 98–110)
Creat: 0.8 mg/dL (ref 0.50–1.05)
GFR, Est African American: 98 mL/min/{1.73_m2} (ref >=60)
GFR, Est Non African American: 84 mL/min/{1.73_m2} (ref >=60)
Globulin: 3.2 g/dL (calc) (ref 1.9–3.7)
Glucose, Bld: 95 mg/dL (ref 65–99)
Potassium: 3.6 mmol/L (ref 3.5–5.3)
Sodium: 140 mmol/L (ref 135–146)
Total Bilirubin: 1 mg/dL (ref 0.2–1.2)
Total Protein: 7.7 g/dL (ref 6.1–8.1)

## 2020-05-07 LAB — URINALYSIS, ROUTINE W REFLEX MICROSCOPIC
Bilirubin Urine: NEGATIVE
Glucose, UA: NEGATIVE
Hgb urine dipstick: NEGATIVE
Ketones, ur: NEGATIVE
Leukocytes,Ua: NEGATIVE
Nitrite: NEGATIVE
Protein, ur: NEGATIVE
Specific Gravity, Urine: 1.025 (ref 1.001–1.03)
pH: 7 (ref 5.0–8.0)

## 2020-05-07 LAB — CBC WITH DIFFERENTIAL/PLATELET
Absolute Monocytes: 589 cells/uL (ref 200–950)
Basophils Absolute: 43 cells/uL (ref 0–200)
Basophils Relative: 0.6 %
Eosinophils Absolute: 78 cells/uL (ref 15–500)
Eosinophils Relative: 1.1 %
HCT: 40.9 % (ref 35.0–45.0)
Hemoglobin: 14.1 g/dL (ref 11.7–15.5)
Lymphs Abs: 1839 cells/uL (ref 850–3900)
MCH: 31.3 pg (ref 27.0–33.0)
MCHC: 34.5 g/dL (ref 32.0–36.0)
MCV: 90.9 fL (ref 80.0–100.0)
MPV: 11.3 fL (ref 7.5–12.5)
Monocytes Relative: 8.3 %
Neutro Abs: 4551 cells/uL (ref 1500–7800)
Neutrophils Relative %: 64.1 %
Platelets: 256 10*3/uL (ref 140–400)
RBC: 4.5 10*6/uL (ref 3.80–5.10)
RDW: 13.1 % (ref 11.0–15.0)
Total Lymphocyte: 25.9 %
WBC: 7.1 10*3/uL (ref 3.8–10.8)

## 2020-05-07 LAB — URINE CULTURE

## 2020-05-07 LAB — AMYLASE: Amylase: 119 U/L — ABNORMAL HIGH (ref 21–101)

## 2020-05-07 LAB — LIPASE: Lipase: 24 U/L (ref 7–60)

## 2020-05-08 ENCOUNTER — Telehealth: Payer: Self-pay | Admitting: Internal Medicine

## 2020-05-08 MED ORDER — ONDANSETRON 4 MG PO TBDP
4.0000 mg | ORAL_TABLET | Freq: Three times a day (TID) | ORAL | 0 refills | Status: DC | PRN
Start: 2020-05-08 — End: 2020-06-09

## 2020-05-08 NOTE — Telephone Encounter (Signed)
Patient is calling again to follow up. Would like an answer before the weekend starts.

## 2020-05-08 NOTE — Telephone Encounter (Signed)
Patient wants to discuss lab results from Wednesday's visit. Still in pain, can nausea medicine be called in?   [facility] [address] [place], [place] - [address] Phone:  [phone]  Fax:  [phone]

## 2020-05-08 NOTE — Telephone Encounter (Signed)
Notified pt w/MD response. Pt states she is concern about the Amylase. She states that is abnormal, and she is wanting to know what that means.Marland KitchenRaechel Solis

## 2020-05-08 NOTE — Telephone Encounter (Signed)
I sent in zofran.   I will not send in any more promethazine.    Labs are normal.  Urine is normal.

## 2020-05-08 NOTE — Telephone Encounter (Signed)
It is only minimal elevated and in relation to all of the other normal labs it is not concerning.

## 2020-05-10 ENCOUNTER — Emergency Department (HOSPITAL_COMMUNITY): Payer: Medicare PPO

## 2020-05-10 ENCOUNTER — Other Ambulatory Visit: Payer: Self-pay

## 2020-05-10 ENCOUNTER — Encounter (HOSPITAL_COMMUNITY): Payer: Self-pay | Admitting: *Deleted

## 2020-05-10 ENCOUNTER — Emergency Department (HOSPITAL_COMMUNITY)
Admission: EM | Admit: 2020-05-10 | Discharge: 2020-05-11 | Disposition: A | Discharge status: Home / Self Care | Payer: Medicare PPO | Attending: Emergency Medicine | Admitting: Emergency Medicine

## 2020-05-10 DIAGNOSIS — Z7982 Long term (current) use of aspirin: Secondary | ICD-10-CM | POA: Diagnosis not present

## 2020-05-10 DIAGNOSIS — R112 Nausea with vomiting, unspecified: Secondary | ICD-10-CM | POA: Diagnosis not present

## 2020-05-10 DIAGNOSIS — R1084 Generalized abdominal pain: Secondary | ICD-10-CM | POA: Diagnosis not present

## 2020-05-10 DIAGNOSIS — Z9049 Acquired absence of other specified parts of digestive tract: Secondary | ICD-10-CM | POA: Diagnosis not present

## 2020-05-10 DIAGNOSIS — R1013 Epigastric pain: Secondary | ICD-10-CM | POA: Diagnosis not present

## 2020-05-10 DIAGNOSIS — Z79899 Other long term (current) drug therapy: Secondary | ICD-10-CM | POA: Diagnosis not present

## 2020-05-10 DIAGNOSIS — R197 Diarrhea, unspecified: Secondary | ICD-10-CM | POA: Diagnosis not present

## 2020-05-10 DIAGNOSIS — I700 Atherosclerosis of aorta: Secondary | ICD-10-CM | POA: Diagnosis not present

## 2020-05-10 DIAGNOSIS — Z90710 Acquired absence of both cervix and uterus: Secondary | ICD-10-CM | POA: Diagnosis not present

## 2020-05-10 DIAGNOSIS — I10 Essential (primary) hypertension: Secondary | ICD-10-CM | POA: Insufficient documentation

## 2020-05-10 DIAGNOSIS — N3289 Other specified disorders of bladder: Secondary | ICD-10-CM | POA: Diagnosis not present

## 2020-05-10 DIAGNOSIS — Z20822 Contact with and (suspected) exposure to covid-19: Secondary | ICD-10-CM | POA: Diagnosis not present

## 2020-05-10 DIAGNOSIS — R079 Chest pain, unspecified: Secondary | ICD-10-CM | POA: Diagnosis not present

## 2020-05-10 LAB — COMPREHENSIVE METABOLIC PANEL
ALT: 15 U/L (ref 0–44)
AST: 21 U/L (ref 15–41)
Albumin: 4.4 g/dL (ref 3.5–5.0)
Alkaline Phosphatase: 72 U/L (ref 38–126)
Anion gap: 10 (ref 5–15)
BUN: 9 mg/dL (ref 6–20)
CO2: 31 mmol/L (ref 22–32)
Calcium: 9.4 mg/dL (ref 8.9–10.3)
Chloride: 98 mmol/L (ref 98–111)
Creatinine, Ser: 0.71 mg/dL (ref 0.44–1.00)
GFR calc Af Amer: >60 mL/min (ref >60)
GFR calc non Af Amer: >60 mL/min (ref >60)
Glucose, Bld: 95 mg/dL (ref 70–99)
Potassium: 3.4 mmol/L — ABNORMAL LOW (ref 3.5–5.1)
Sodium: 139 mmol/L (ref 135–145)
Total Bilirubin: 1.3 mg/dL — ABNORMAL HIGH (ref 0.3–1.2)
Total Protein: 8.2 g/dL — ABNORMAL HIGH (ref 6.5–8.1)

## 2020-05-10 LAB — LIPASE, BLOOD: Lipase: 27 U/L (ref 11–51)

## 2020-05-10 LAB — CBC
HCT: 39.2 % (ref 36.0–46.0)
Hemoglobin: 13.8 g/dL (ref 12.0–15.0)
MCH: 31.4 pg (ref 26.0–34.0)
MCHC: 35.2 g/dL (ref 30.0–36.0)
MCV: 89.3 fL (ref 80.0–100.0)
Platelets: 261 10*3/uL (ref 150–400)
RBC: 4.39 MIL/uL (ref 3.87–5.11)
RDW: 14 % (ref 11.5–15.5)
WBC: 8.2 10*3/uL (ref 4.0–10.5)
nRBC: 0 % (ref 0.0–0.2)

## 2020-05-10 LAB — URINALYSIS, ROUTINE W REFLEX MICROSCOPIC
Bilirubin Urine: NEGATIVE
Glucose, UA: NEGATIVE mg/dL
Hgb urine dipstick: NEGATIVE
Ketones, ur: NEGATIVE mg/dL
Leukocytes,Ua: NEGATIVE
Nitrite: NEGATIVE
Protein, ur: NEGATIVE mg/dL
Specific Gravity, Urine: 1.002 — ABNORMAL LOW (ref 1.005–1.030)
pH: 7 (ref 5.0–8.0)

## 2020-05-10 LAB — TROPONIN I (HIGH SENSITIVITY): Troponin I (High Sensitivity): 4 ng/L (ref <18)

## 2020-05-10 MED ORDER — FENTANYL CITRATE (PF) 100 MCG/2ML IJ SOLN
50.0000 ug | Freq: Once | INTRAMUSCULAR | Status: DC
  Filled 2020-05-10: qty 2

## 2020-05-10 MED ORDER — PROMETHAZINE HCL 25 MG/ML IJ SOLN
12.5000 mg | Freq: Once | INTRAMUSCULAR | Status: AC
  Administered 2020-05-10: 12.5 mg via INTRAVENOUS
  Filled 2020-05-10: qty 1

## 2020-05-10 NOTE — ED Provider Notes (Signed)
[facility]-EMERGENCY DEPT Provider Note   CSN: [id number] Arrival date & time: 05/10/20  1332     History Chief Complaint  Patient presents with  . Abdominal Pain  . Nausea  . Emesis    Kathleen Solis is a 54 y.o. female with past medical history of diverticulitis, hyperlipidemia, hypertension, stroke that presents the emergency department today for nausea, vomiting, abdominal pain.  Patient states that she has been having this pain for about a week.  Abdominal pain is severe and constant radiates to her back.  Food does not make it worse or better.  States that she did have episode of pancreatitis in 2014 before she moved to [place], this is not in her records.  States that she does not drink alcohol.  Is also having some right upper quadrant pain.  States that she has been trying Zofran at home which has not been helping.  Patient saw her PCP 4 days ago, was worried because her amylase was slightly elevated to 119.  Normal lipase.  Also admits to some diarrhea, no hematochezia or melena.  Denies any fevers, chills, numbness, tingling, weakness, shortness of breath.  States that when she was waiting in the waiting room she had an episode of chest pain that lasted a couple minutes, was in the center of her chest right above her epigastric area.  States that chest pain has resolved.  States that it did not radiate anywhere, was occurring while she was sitting, not on exertion.  States that her abdominal pain has been worsening over the past week.  Is concerned about pancreatitis.  No vaginal symptoms, vaginal bleeding or vaginal discharge or pelvic pain.  States that she has been using Tylenol which has been providing some relief.  Does not use excessive amounts of Tylenol. HPI     Past Medical History:  Diagnosis Date  . Diverticulitis   . Hyperlipidemia    diet controlled - no med  . Hypertension   . Sciatica    right  . Seasonal allergies   . Stroke [facility])  2000   several mini strokes, weakness on right arm and leg  . SVD (spontaneous vaginal delivery)    x 2    Patient Active Problem List   Diagnosis Date Noted  . Epigastric pain 05/06/2020  . Urinary frequency 05/06/2020  . Elevated cholesterol 05/06/2020  . Viral gastroenteritis 11/25/2019  . Sinus infection 03/22/2019  . Cough 03/17/2019  . Abdominal pain 09/12/2018  . Lumbar radiculopathy 05/15/2018  . Lumbar back pain 05/10/2018  . B12 deficiency 02/23/2018  . Tingling 02/21/2018  . Onychomycosis 02/21/2018  . Left ovarian cyst 10/04/2017  . Nausea 06/07/2017  . Dark stools 06/07/2017  . Hyperlipidemia 02/14/2017  . Chest pain 02/14/2017  . Hyperglycemia 02/14/2017  . Tinnitus of right ear 11/16/2016  . URI (upper respiratory infection) 11/09/2016  . Constipation 09/13/2016  . Allergic rhinitis 08/16/2016  . Carotid arterial disease (HCC) 04/10/2016  . Diverticulitis of colon without hemorrhage 02/25/2016  . Bunion of left foot 02/08/2016  . Bone spur of right foot 02/08/2016  . Leg neuralgia, right 02/08/2016  . Episodic tension type headache 01/12/2016  . Benign essential HTN 03/03/2015  . History of cerebrovascular accident with residual deficit 03/03/2015    Past Surgical History:  Procedure Laterality Date  . ABDOMINAL HYSTERECTOMY     partial  . ANAL FISSURE REPAIR    . APPENDECTOMY    . CHOLECYSTECTOMY    . CYSTOSCOPY  N/A 10/04/2017   Procedure: CYSTOSCOPY;  Surgeon: Gerald Leitz, MD;  Location: [facility];  Service: Gynecology;  Laterality: N/A;  . FOOT SURGERY Left    big toe bunion removed  . LAPAROSCOPIC BILATERAL SALPINGO OOPHERECTOMY Bilateral 10/04/2017   Procedure: LAPAROSCOPIC BILATERAL SALPINGO OOPHORECTOMY, PELVIC WASHINGS;  Surgeon: Gerald Leitz, MD;  Location: [facility];  Service: Gynecology;  Laterality: Bilateral;  . TUBAL LIGATION    . WISDOM TOOTH EXTRACTION       OB History    Gravida  4   Para  2   Term  2   Preterm      AB  2    Living  2     SAB  2   TAB      Ectopic      Multiple      Live Births  2           Family History  Problem Relation Age of Onset  . Breast cancer Sister   . Diabetes Sister   . Glaucoma Sister   . Cataracts Sister   . Hypertension Sister   . Anuerysm Mother   . Pancreatic cancer Father   . Retinal detachment Brother   . Cataracts Sister   . Breast cancer Cousin   . Breast cancer Niece     Social History   Tobacco Use  . Smoking status: Never Smoker  . Smokeless tobacco: Never Used  Vaping Use  . Vaping Use: Never used  Substance Use Topics  . Alcohol use: No  . Drug use: No    Home Medications Prior to Admission medications   Medication Sig Start Date End Date Taking? Authorizing Provider  albuterol (VENTOLIN HFA) 108 (90 Base) MCG/ACT inhaler Inhale 2 puffs into the lungs every 6 (six) hours as needed for wheezing or shortness of breath. 04/08/19   Pincus Sanes, MD  aspirin EC 81 MG tablet Take 81 mg by mouth at bedtime.     [provider]  atorvastatin (LIPITOR) 10 MG tablet Take 1 tablet (10 mg total) by mouth at bedtime. 05/06/20   Pincus Sanes, MD  budesonide-formoterol (SYMBICORT) 160-4.5 MCG/ACT inhaler Inhale 2 puffs into the lungs 2 (two) times daily. 08/20/19   Leslye Peer, MD  hydrochlorothiazide (HYDRODIURIL) 25 MG tablet Take 1 tablet by mouth once daily. . Patient taking differently: Take 25 mg by mouth at bedtime.  10/02/19   Pincus Sanes, MD  metoprolol succinate (TOPROL-XL) 100 MG 24 hr tablet Take 1 tablet (100 mg total) by mouth at bedtime. 05/06/20   Pincus Sanes, MD  omeprazole (PRILOSEC) 20 MG capsule Take 1 capsule (20 mg total) by mouth daily. Take 30 minutes prior to a meal 05/06/20   Pincus Sanes, MD  ondansetron (ZOFRAN ODT) 4 MG disintegrating tablet Take 1 tablet (4 mg total) by mouth every 8 (eight) hours as needed for nausea or vomiting. 05/08/20   Pincus Sanes, MD  promethazine (PHENERGAN) 12.5 MG tablet  Take 2 tablets (25 mg total) by mouth every 6 (six) hours as needed for nausea or vomiting. 05/11/20   Farrel Gordon, PA-C    Allergies    Benzonatate, Darvon [propoxyphene], Ibuprofen, Morphine and related, Prednisone, Sulfa antibiotics, Tussionex pennkinetic er [hydrocod polst-cpm polst er], Buprenorphine hcl, Ivp dye [iodinated diagnostic agents], and Prochlorperazine edisylate  Review of Systems   Review of Systems  Constitutional: Negative for chills, diaphoresis, fatigue and fever.  HENT: Negative for congestion, drooling, facial  swelling, postnasal drip, rhinorrhea, sinus pressure, sore throat and trouble swallowing.   Eyes: Negative for pain and visual disturbance.  Respiratory: Negative for cough, shortness of breath and wheezing.   Cardiovascular: Negative for chest pain, palpitations and leg swelling.  Gastrointestinal: Positive for abdominal pain, diarrhea, nausea and vomiting. Negative for abdominal distention, anal bleeding and constipation.  Genitourinary: Negative for difficulty urinating.  Musculoskeletal: Negative for back pain, neck pain and neck stiffness.  Skin: Negative for color change, pallor and wound.  Neurological: Negative for dizziness, speech difficulty, weakness and headaches.  Psychiatric/Behavioral: Negative for confusion.    Physical Exam Updated Vital Signs BP (!) 109/92   Pulse 60   Temp 98.2 F (36.8 C)   Resp 18   Ht 5' 5.5" (1.664 m)   Wt 84.4 kg   SpO2 100%   BMI 30.48 kg/m   Physical Exam Constitutional:      General: She is not in acute distress.    Appearance: Normal appearance. She is not ill-appearing, toxic-appearing or diaphoretic.  HENT:     Head: Normocephalic and atraumatic.     Mouth/Throat:     Mouth: Mucous membranes are moist.     Pharynx: Oropharynx is clear.  Eyes:     General: No scleral icterus.    Extraocular Movements: Extraocular movements intact.     Pupils: Pupils are equal, round, and reactive to light.    Cardiovascular:     Rate and Rhythm: Normal rate and regular rhythm.     Pulses: Normal pulses.     Heart sounds: Normal heart sounds.  Pulmonary:     Effort: Pulmonary effort is normal. No respiratory distress.     Breath sounds: Normal breath sounds. No stridor. No wheezing, rhonchi or rales.  Chest:     Chest wall: Tenderness (to sternum, right above epigastrum) present.  Abdominal:     General: Abdomen is flat. Bowel sounds are normal. There is no distension.     Palpations: Abdomen is soft.     Tenderness: There is abdominal tenderness in the right upper quadrant and epigastric area. There is no right CVA tenderness, left CVA tenderness, guarding or rebound. Negative signs include Murphy's sign, Rovsing's sign, McBurney's sign, psoas sign and obturator sign.  Musculoskeletal:        General: No swelling or tenderness. Normal range of motion.     Cervical back: Normal range of motion and neck supple. No rigidity.     Right lower leg: No edema.     Left lower leg: No edema.  Skin:    General: Skin is warm and dry.     Capillary Refill: Capillary refill takes less than 2 seconds.     Coloration: Skin is not pale.  Neurological:     General: No focal deficit present.     Mental Status: She is alert and oriented to person, place, and time.  Psychiatric:        Mood and Affect: Mood normal.        Behavior: Behavior normal.     ED Results / Procedures / Treatments   Labs (all labs ordered are listed, but only abnormal results are displayed) Labs Reviewed  COMPREHENSIVE METABOLIC PANEL - Abnormal; Notable for the following components:      Result Value   Potassium 3.4 (*)    Total Protein 8.2 (*)    Total Bilirubin 1.3 (*)    All other components within normal limits  URINALYSIS, ROUTINE W REFLEX MICROSCOPIC -  Abnormal; Notable for the following components:   Color, Urine STRAW (*)    Specific Gravity, Urine 1.002 (*)    All other components within normal limits  LIPASE,  BLOOD  CBC  TROPONIN I (HIGH SENSITIVITY)    EKG EKG Interpretation  Date/Time:  Sunday May 10 2020 15:16:00 EDT Ventricular Rate:  58 PR Interval:    QRS Duration: 103 QT Interval:  423 QTC Calculation: 416 R Axis:   39 Text Interpretation: Sinus rhythm Nonspecific T abnormalities, anterior leads 12 Lead; Mason-Likar No significant change since last tracing Confirmed by Richardean Canal [phone]) on 05/10/2020 10:21:58 PM   Radiology CT Abdomen Pelvis Wo Contrast  Result Date: 05/10/2020 CLINICAL DATA:  Possible pancreatitis EXAM: CT ABDOMEN AND PELVIS WITHOUT CONTRAST TECHNIQUE: Multidetector CT imaging of the abdomen and pelvis was performed following the standard protocol without IV contrast. COMPARISON:  11/02/2019 FINDINGS: Lower chest: No acute abnormality. Hepatobiliary: No focal liver abnormality is seen. Status post cholecystectomy. No biliary dilatation. Pancreas: Unremarkable. No pancreatic ductal dilatation or surrounding inflammatory changes. Spleen: Normal in size without focal abnormality. Adrenals/Urinary Tract: Adrenal glands are within normal limits. Kidneys are well visualized bilaterally. No renal calculi or obstructive changes are seen. The bladder is well distended. Stomach/Bowel: The appendix has been surgically removed. No obstructive or inflammatory changes of the colon or small bowel are seen. Stomach is within normal limits. Vascular/Lymphatic: Aortic atherosclerosis. No enlarged abdominal or pelvic lymph nodes. Reproductive: Status post hysterectomy. No adnexal masses. Other: No abdominal wall hernia or abnormality. No abdominopelvic ascites. Musculoskeletal: No acute or significant osseous findings. IMPRESSION: Normal-appearing pancreas. No acute abnormality is seen to correspond with patient's given clinical history. Electronically Signed   By: Alcide Clever M.D.   On: 05/10/2020 23:28   DG Chest Portable 1 View  Result Date: 05/10/2020 CLINICAL DATA:  Chest pain  EXAM: PORTABLE CHEST 1 VIEW COMPARISON:  04/23/2019 FINDINGS: The heart size and mediastinal contours are within normal limits. Both lungs are clear. The visualized skeletal structures are unremarkable. IMPRESSION: No active disease. Electronically Signed   By: Alcide Clever M.D.   On: 05/10/2020 22:27    Procedures Procedures (including critical care time)  Medications Ordered in ED Medications  fentaNYL (SUBLIMAZE) injection 50 mcg (50 mcg Intravenous Refused 05/10/20 2227)  promethazine (PHENERGAN) injection 12.5 mg (12.5 mg Intravenous Given 05/10/20 2226)    ED Course  I have reviewed the triage vital signs and the nursing notes.  Pertinent labs & imaging results that were available during my care of the patient were reviewed by me and considered in my medical decision making (see chart for details).    MDM Rules/Calculators/A&P                         Kathleen Solis is a 54 y.o. female with past medical history of diverticulitis, hyperlipidemia, hypertension, stroke that presents the emergency department today for nausea, vomiting, abdominal pain.  Patient with tenderness to right upper quadrant, no true Murphy sign more tender to epigastric area.  Patient is concerned about pancreatitis will obtain labs and CT imaging at this time.  Since patient had episode of chest pain, will also obtain troponin and chest x-ray at this time.  I do not think that patient has ACS since chest will is tender to palpation, however patient wants to be worked up for this.  Differential to include pancreatitis, cholecystitis, peptic or gastric ulcer, gastritis.  Do not have ultrasound at  this time of the night and patient does not have right upper quadrant tenderness with normal white count, do not think patient need to be transferred for this.  Work-up so far reassuring.  No leukocytosis, CBC and CMP normal.  Urinalysis normal.  Lipase normal.  Checks x-ray without any acute cardiopulmonary disease interpreted  by me.  EKG interpreted me without any signs of ischemia.  First troponin 4.  Do not think we need second troponin, patient states that she does not want to have second troponin drawn.    Patient rejected pain medications.  Did discuss GI cocktail since this could be a duodenal or gastric ulcer, patient also rejected this.  CT abdomen pelvis negative.  I think the patient could have ulcer, will refer to GI at this time.  Will did speak to patient about symptomatic treatment including PPIs.  Patient states that she will get this prescribed from GI.    Patient passed p.o. challenge, has not had any vomiting or diarrhea while being here for 10 and half hours.  Patient to be discharged.Doubt need for further emergent work up at this time. I explained the diagnosis and have given explicit precautions to return to the ER including for any other new or worsening symptoms. The patient understands and accepts the medical plan as it's been dictated and I have answered their questions. Discharge instructions concerning home care and prescriptions have been given. The patient is STABLE and is discharged to home in good condition.   Final Clinical Impression(s) / ED Diagnoses Final diagnoses:  Epigastric pain  Nausea vomiting and diarrhea    Rx / DC Orders ED Discharge Orders         Ordered    promethazine (PHENERGAN) 12.5 MG tablet  Every 6 hours PRN        05/11/20 0009           Farrel Gordon, PA-C 05/11/20 0016    Charlynne Pander, MD 05/14/20 1859

## 2020-05-10 NOTE — ED Triage Notes (Addendum)
Pt states she has had N/V/D and when she eats, everything comes back up. Pain in abd and back. States recent labs showed elevated Amalyse is elevated   At end of triage she added her chest just started to hurt.

## 2020-05-10 NOTE — ED Notes (Signed)
Attempted to draw labs, unable

## 2020-05-11 ENCOUNTER — Other Ambulatory Visit: Payer: Self-pay | Admitting: Internal Medicine

## 2020-05-11 MED ORDER — PROMETHAZINE HCL 12.5 MG PO TABS: 25.0000 mg | ORAL_TABLET | Freq: Four times a day (QID) | ORAL | 0 refills | Status: DC | PRN

## 2020-05-11 NOTE — Discharge Instructions (Signed)
Your work-up today was reassuring.  Call the GI doctor as we spoke about to make up appointment. Use the Phenergan as needed for nausea. Use the guides attached. Please come back to the emergency department for any new or worsening concerns symptoms.

## 2020-05-18 ENCOUNTER — Other Ambulatory Visit: Payer: Self-pay | Admitting: Obstetrics and Gynecology

## 2020-05-18 DIAGNOSIS — Z1231 Encounter for screening mammogram for malignant neoplasm of breast: Secondary | ICD-10-CM

## 2020-06-05 ENCOUNTER — Ambulatory Visit: Payer: Medicare PPO | Admitting: Allergy

## 2020-06-05 ENCOUNTER — Other Ambulatory Visit: Payer: Self-pay

## 2020-06-05 ENCOUNTER — Encounter: Payer: Self-pay | Admitting: Allergy

## 2020-06-05 VITALS — BP 126/84 | HR 67 | Temp 97.4°F | Resp 16 | Ht 65.5 in | Wt 190.0 lb

## 2020-06-05 DIAGNOSIS — H1013 Acute atopic conjunctivitis, bilateral: Secondary | ICD-10-CM | POA: Diagnosis not present

## 2020-06-05 DIAGNOSIS — J45991 Cough variant asthma: Secondary | ICD-10-CM | POA: Diagnosis not present

## 2020-06-05 DIAGNOSIS — J3089 Other allergic rhinitis: Secondary | ICD-10-CM

## 2020-06-05 MED ORDER — FEXOFENADINE HCL 180 MG PO TABS: 180.0000 mg | ORAL_TABLET | Freq: Every day | ORAL | 5 refills | Status: DC

## 2020-06-05 MED ORDER — AZELASTINE-FLUTICASONE 137-50 MCG/ACT NA SUSP: 2.0000 | NASAL | 5 refills | Status: DC

## 2020-06-05 MED ORDER — OLOPATADINE HCL 0.1 % OP SOLN: 1.0000 [drp] | Freq: Two times a day (BID) | OPHTHALMIC | 5 refills | Status: DC

## 2020-06-05 NOTE — Progress Notes (Signed)
New Patient Note  RE: Kathleen Solis MRN: [id number] DOB: 12-15-1965 Date of Office Visit: 06/05/2020  Referring provider: Collene Gobble, MD Primary care provider: Binnie Rail, MD  Chief Complaint: allergies  History of present illness: Kathleen Solis is a 54 y.o. female presenting today for consultation for allergic rhinitis and testing.   She states she is here for allergy testing.  She reports a lot of coughing, sneezing, nasal congestion, drainage with throat clearing, some ear itching.  Symptoms are year-round and states ongoing for years.  She has tried claritin, zyrtec in the past that were not helpful.  She states she tried singulair about a year ago but states no longer takes it.  She does not recall it making a difference.  She has tried flonase in the past that did help with congestion.   She states she had a reaction to benadryl that occurred many years ago and she doesn't recall what happened but states she could have broken out in hives.  Has not taken benadryl again.  She states prednisone gives her headaches.   She follows with Dr. Lamonte Sakai at [facility] pulmonology.  She states she was diagnosed with asthma by Dr. Lamonte Sakai.  She states prior to this she was being treated often for bronchitis.  She is on Symbicort that she reports taking as needed.  She states this month she has used it about 3 times.  She will take the symbicort when she starts coughing a lot.  She denies wheezing, shortness of breath.  Does report having some chest pain.     She states she has tried different medications for reflux that has not helped.  She will be seeing GI next week for evaluation.   She had allergy testing years ago around he 81s.  She states she did course of immunotherapy but does not remember how long.   She reports having several mini-strokes that has affected her memory.    Review of systems: Review of Systems  Constitutional: Negative.   HENT:       See HPI  Eyes:  Negative.   Respiratory:       See HPI  Cardiovascular: Negative.   Gastrointestinal: Negative.   Musculoskeletal: Negative.   Skin: Negative.   Neurological: Negative.     All other systems negative unless noted above in HPI  Past medical history: Past Medical History:  Diagnosis Date  . Asthma   . Diverticulitis   . Hyperlipidemia    diet controlled - no med  . Hypertension   . Sciatica    right  . Seasonal allergies   . Stroke [facility]) 2000   several mini strokes, weakness on right arm and leg  . SVD (spontaneous vaginal delivery)    x 2    Past surgical history: Past Surgical History:  Procedure Laterality Date  . ABDOMINAL HYSTERECTOMY     partial  . ANAL FISSURE REPAIR    . APPENDECTOMY    . CHOLECYSTECTOMY    . CYSTOSCOPY N/A 10/04/2017   Procedure: CYSTOSCOPY;  Surgeon: Christophe Louis, MD;  Location: [facility];  Service: Gynecology;  Laterality: N/A;  . FOOT SURGERY Left    big toe bunion removed  . LAPAROSCOPIC BILATERAL SALPINGO OOPHERECTOMY Bilateral 10/04/2017   Procedure: LAPAROSCOPIC BILATERAL SALPINGO OOPHORECTOMY, PELVIC WASHINGS;  Surgeon: Christophe Louis, MD;  Location: [facility];  Service: Gynecology;  Laterality: Bilateral;  . TUBAL LIGATION    . WISDOM TOOTH EXTRACTION      Family  history:  Family History  Problem Relation Age of Onset  . Breast cancer Sister   . Diabetes Sister   . Glaucoma Sister   . Cataracts Sister   . Hypertension Sister   . Anuerysm Mother   . Pancreatic cancer Father   . Retinal detachment Brother   . Cataracts Sister   . Breast cancer Cousin   . Breast cancer Niece     Social history: Lives in an apartment with carpeting with electric heating and central cooling.  No pets in the home.  There is no concern for water damage mildew or roaches in the home.  She is retired.  She denies a smoking history.  Medication List: Current Outpatient Medications  Medication Sig Dispense Refill  . acetaminophen (TYLENOL) 500 MG tablet  Take 500 mg by mouth every 6 (six) hours as needed.    Marland Kitchen atorvastatin (LIPITOR) 10 MG tablet Take 1 tablet (10 mg total) by mouth at bedtime. 90 tablet 1  . budesonide-formoterol (SYMBICORT) 160-4.5 MCG/ACT inhaler Inhale 2 puffs into the lungs 2 (two) times daily. 1 Inhaler 5  . hydrochlorothiazide (HYDRODIURIL) 25 MG tablet Take 1 tablet by mouth once daily 90 tablet 1  . metoprolol succinate (TOPROL-XL) 100 MG 24 hr tablet Take 1 tablet (100 mg total) by mouth at bedtime. 90 tablet 1  . albuterol (VENTOLIN HFA) 108 (90 Base) MCG/ACT inhaler Inhale 2 puffs into the lungs every 6 (six) hours as needed for wheezing or shortness of breath. (Patient not taking: Reported on 06/05/2020) 6.7 g 1  . aspirin EC 81 MG tablet Take 81 mg by mouth at bedtime.  (Patient not taking: Reported on 06/05/2020)    . Azelastine-Fluticasone (DYMISTA) 137-50 MCG/ACT SUSP Place 2 sprays into both nostrils 1 day or 1 dose. 23 g 5  . fexofenadine (ALLEGRA) 180 MG tablet Take 1 tablet (180 mg total) by mouth daily. 30 tablet 5  . olopatadine (PATANOL) 0.1 % ophthalmic solution Place 1 drop into both eyes 2 (two) times daily. 5 mL 5  . omeprazole (PRILOSEC) 20 MG capsule Take 1 capsule (20 mg total) by mouth daily. Take 30 minutes prior to a meal (Patient not taking: Reported on 06/05/2020) 30 capsule 5  . ondansetron (ZOFRAN ODT) 4 MG disintegrating tablet Take 1 tablet (4 mg total) by mouth every 8 (eight) hours as needed for nausea or vomiting. (Patient not taking: Reported on 06/05/2020) 20 tablet 0  . promethazine (PHENERGAN) 12.5 MG tablet Take 2 tablets (25 mg total) by mouth every 6 (six) hours as needed for nausea or vomiting. (Patient not taking: Reported on 06/05/2020) 20 tablet 0   No current facility-administered medications for this visit.    Known medication allergies: Allergies  Allergen Reactions  . Benzonatate Other (See Comments)    Headaches  . Darvon [Propoxyphene] Other (See Comments)    Gi  problems  . Ibuprofen     tinnitus  . Morphine And Related Itching  . Prednisone Other (See Comments)    Make her head hurt  . Sulfa Antibiotics Hives  . Tussionex Pennkinetic Er [Hydrocod Polst-Cpm Polst Er] Other (See Comments)    Nausea, vomiting  . Buprenorphine Hcl Itching  . Ivp Dye [Iodinated Diagnostic Agents] Nausea And Vomiting  . Prochlorperazine Edisylate Itching and Other (See Comments)    "Acts weird: Hallucinations"     Physical examination: Blood pressure 126/84, pulse 67, temperature (!) 97.4 F (36.3 C), resp. rate 16, height 5' 5.5" (1.664 m), weight 190  lb (86.2 kg), SpO2 97 %.  General: Alert, interactive, in no acute distress. HEENT: PERRLA, TMs pearly gray, turbinates moderately edematous without discharge, post-pharynx non erythematous. Neck: Supple without lymphadenopathy. Lungs: Clear to auscultation without wheezing, rhonchi or rales. {no increased work of breathing. CV: Normal S1, S2 without murmurs. Abdomen: Nondistended, nontender. Skin: Warm and dry, without lesions or rashes. Extremities:  No clubbing, cyanosis or edema. Neuro:   Grossly intact.  Diagnositics/Labs:  Spirometry: FEV1: 2.02L 86%, FVC: 2.6L 88%, ratio consistent with nonobstructive pattern  Allergy testing: Environmental allergy skin prick testing is positive to grass pollens, weed pollen tree pollens Intradermal testing is positive to mold mix 2, mold mix 4 and cockroach. Allergy testing results were read and interpreted by provider, documented by clinical staff.   Assessment and plan: Allergic rhinitis with conjunctivitis  - environmental allergy testing is positive to grass pollen, weed pollen, tree pollen, mold and cockroach.   - allergen avoidance measures discussed/handouts provided - for nasal congestion and drainage control start Dymista 1 spray each nostril twice a day.  This is a combination nasal spray with Flonase + Astelin (nasal antihistamine).  This helps with  both nasal congestion and drainage.  If not covered under your insurance plan then will prescribe the sprays separately.  - consider performing nasal saline rinses.  This helps to flush out the sinus passages and also helps your medicated nasal sprays work more effectively.  Use with distilled water or boiled water and bring down to room temperature prior to use.  Breathe through your mouth the entire process.  Rinse kit provided today.  - try Allegra $RemoveBef'180mg'IWaQeWYrIH$  daily as needed for general allergy symptom control.  This is a long-acting antihistamine like Claritin, Zyrtec you have tried that may be more effective - for itchy/watery eyes use Olopatadine 0.1% 1 drop each eye twice daily as needed - if medication management is not effective then consider allergen immunotherapy which is a 3-5 year therapy to help re-train the body to be not allergic to the environmental allergens above.   Asthma, cough variant - continue follow-up with Dr. Lamonte Sakai - continue Symbicort 2 puffs that can be done as needed for cough/wheeze/shortness of breath/chest tightness.  - if you are not meeting the below goals then would make Symbicort 2 puffs twice a day every day for better control  Follow-up in 3-4 months or sooner if needed  I appreciate the opportunity to take part in Jaquisha's care. Please do not hesitate to contact me with questions.  Sincerely,   Prudy Feeler, MD Allergy/Immunology [facility] [place]

## 2020-06-05 NOTE — Patient Instructions (Addendum)
-  environmental allergy testing is positive to grass pollen, weed pollen, tree pollen, mold and cockroach.   - allergen avoidance measures discussed/handouts provided - for nasal congestion and drainage control start Dymista 1 spray each nostril twice a day.  This is a combination nasal spray with Flonase + Astelin (nasal antihistamine).  This helps with both nasal congestion and drainage.  If not covered under your insurance plan then will prescribe the sprays separately.  - consider performing nasal saline rinses.  This helps to flush out the sinus passages and also helps your medicated nasal sprays work more effectively.  Use with distilled water or boiled water and bring down to room temperature prior to use.  Breathe through your mouth the entire process.  Rinse kit provided today.  - try Allegra 11m daily as needed for general allergy symptom control.  This is a long-acting antihistamine like Claritin, Zyrtec you have tried that may be more effective - for itchy/watery eyes use Olopatadine 0.1% 1 drop each eye twice daily as needed - if medication management is not effective then consider allergen immunotherapy which is a 3-5 year therapy to help re-train the body to be not allergic to the environmental allergens above.   - continue follow-up with Dr. BLamonte Sakai- continue Symbicort 2 puffs that can be done as needed for cough/wheeze/shortness of breath/chest tightness.  - if you are not meeting the below goals then would make Symbicort 2 puffs twice a day every day for better control  Follow-up in 3-4 months or sooner if needed

## 2020-06-09 ENCOUNTER — Other Ambulatory Visit: Payer: Medicare PPO

## 2020-06-09 ENCOUNTER — Encounter: Payer: Self-pay | Admitting: Physician Assistant

## 2020-06-09 ENCOUNTER — Ambulatory Visit: Payer: Medicare PPO | Admitting: Physician Assistant

## 2020-06-09 VITALS — BP 120/68 | HR 70 | Ht 65.5 in | Wt 190.0 lb

## 2020-06-09 DIAGNOSIS — R110 Nausea: Secondary | ICD-10-CM | POA: Diagnosis not present

## 2020-06-09 DIAGNOSIS — R634 Abnormal weight loss: Secondary | ICD-10-CM | POA: Diagnosis not present

## 2020-06-09 DIAGNOSIS — R1013 Epigastric pain: Secondary | ICD-10-CM

## 2020-06-09 DIAGNOSIS — R197 Diarrhea, unspecified: Secondary | ICD-10-CM

## 2020-06-09 MED ORDER — PANTOPRAZOLE SODIUM 40 MG PO TBEC: 40.0000 mg | DELAYED_RELEASE_TABLET | Freq: Two times a day (BID) | ORAL | 2 refills | Status: DC

## 2020-06-09 MED ORDER — PROMETHAZINE HCL 25 MG PO TABS: 25.0000 mg | ORAL_TABLET | Freq: Four times a day (QID) | ORAL | 0 refills | Status: DC | PRN

## 2020-06-09 NOTE — Progress Notes (Signed)
Reviewed and agree with management plans. ? ?Sung Renton L. Genecis Veley, MD, MPH  ?

## 2020-06-09 NOTE — Progress Notes (Signed)
Chief Complaint: Epigastric pain, diarrhea and nausea  HPI:    Kathleen Solis is a 54 year old African-American female with a past medical history as listed below including stroke and a family history of pancreatic cancer in her father, who was referred to me by [name], Stacy J, MD for a complaint of epigastric pain, diarrhea and nausea.      05/10/2020 patient seen in the ED for epigastric pain.  At that time a CMP with a potassium of 3.4, total bilirubin minimally elevated at 1.3, lipase and CBC as well as troponin are normal.  CT abdomen pelvis without contrast showed normal-appearing pancreas.  At that time patient rejected pain medications including GI cocktail did not want to be started on a PPI.    Today, the patient tells me that over the past month she has had epigastric pain which is not necessarily worse with eating but she just does not have an appetite.  She is constantly nauseous and anytime she does get anything in her mouth she has diarrhea.  She has not had a solid stool in over a month.  Describes the pain as a constant 8/10 and tells me that she just feels bad.  Explains that she was started on a PPI a few weeks ago because of her asthma and they were thinking that possibly GERD was contributing but this did not help the pain so she stopped it.  She currently uses Pepcid/Tums but this does not help her pain at all.  Fairly anxious over the fact that her father had pancreatic cancer.  Associated symptoms include a weight loss of around 10 to 20 pounds over the past month and occasional episodes of vomiting.    Denies fever, chills or blood in her stool.  Past Medical History:  Diagnosis Date  . Asthma   . Diverticulitis   . Hyperlipidemia    diet controlled - no med  . Hypertension   . Sciatica    right  . Seasonal allergies   . Stroke [facility] 2000   several mini strokes, weakness on right arm and leg  . SVD (spontaneous vaginal delivery)    x 2    Past Surgical History:    Procedure Laterality Date  . ABDOMINAL HYSTERECTOMY     partial  . ANAL FISSURE REPAIR    . APPENDECTOMY    . CHOLECYSTECTOMY    . CYSTOSCOPY N/A 10/04/2017   Procedure: CYSTOSCOPY;  Surgeon: Gerald Leitzole, Tara, MD;  Location: [facility];  Service: Gynecology;  Laterality: N/A;  . FOOT SURGERY Left    big toe bunion removed  . LAPAROSCOPIC BILATERAL SALPINGO OOPHERECTOMY Bilateral 10/04/2017   Procedure: LAPAROSCOPIC BILATERAL SALPINGO OOPHORECTOMY, PELVIC WASHINGS;  Surgeon: Gerald Leitzole, Tara, MD;  Location: [facility];  Service: Gynecology;  Laterality: Bilateral;  . TUBAL LIGATION    . WISDOM TOOTH EXTRACTION      Current Outpatient Medications  Medication Sig Dispense Refill  . acetaminophen (TYLENOL) 500 MG tablet Take 500 mg by mouth every 6 (six) hours as needed.    Marland Kitchen. atorvastatin (LIPITOR) 10 MG tablet Take 1 tablet (10 mg total) by mouth at bedtime. 90 tablet 1  . Azelastine-Fluticasone (DYMISTA) 137-50 MCG/ACT SUSP Place 2 sprays into both nostrils 1 day or 1 dose. 23 g 5  . budesonide-formoterol (SYMBICORT) 160-4.5 MCG/ACT inhaler Inhale 2 puffs into the lungs 2 (two) times daily. 1 Inhaler 5  . fexofenadine (ALLEGRA) 180 MG tablet Take 1 tablet (180 mg total) by mouth daily. 30 tablet  5  . hydrochlorothiazide (HYDRODIURIL) 25 MG tablet Take 1 tablet by mouth once daily 90 tablet 1  . metoprolol succinate (TOPROL-XL) 100 MG 24 hr tablet Take 1 tablet (100 mg total) by mouth at bedtime. 90 tablet 1  . pantoprazole (PROTONIX) 40 MG tablet Take 1 tablet (40 mg total) by mouth 2 (two) times daily. 60 tablet 2  . promethazine (PHENERGAN) 25 MG tablet Take 1 tablet (25 mg total) by mouth every 6 (six) hours as needed for nausea or vomiting. 30 tablet 0   No current facility-administered medications for this visit.    Allergies as of 06/09/2020 - Review Complete 06/09/2020  Allergen Reaction Noted  . Benzonatate Other (See Comments) 11/02/2019  . Darvon [propoxyphene] Other (See Comments) 06/07/2017   . Ibuprofen  03/13/2020  . Morphine and related Itching 08/22/2011  . Prednisone Other (See Comments) 04/08/2019  . Sulfa antibiotics Hives 08/22/2011  . Tussionex pennkinetic er [hydrocod polst-cpm polst er] Other (See Comments) 10/17/2018  . Buprenorphine hcl Itching 03/03/2015  . Ivp dye [iodinated diagnostic agents] Nausea And Vomiting 04/23/2019  . Prochlorperazine edisylate Itching and Other (See Comments) 03/03/2015    Family History  Problem Relation Age of Onset  . Breast cancer Sister   . Diabetes Sister   . Glaucoma Sister   . Cataracts Sister   . Hypertension Sister   . Anuerysm Mother   . Pancreatic cancer Father   . Retinal detachment Brother   . Cataracts Sister   . Breast cancer Cousin   . Breast cancer Niece   . Esophageal cancer Nephew     Social History   Socioeconomic History  . Marital status: Single    Spouse name: Not on file  . Number of children: 2  . Years of education: Not on file  . Highest education level: Associate degree: academic program  Occupational History  . Occupation: disabled  Tobacco Use  . Smoking status: Never Smoker  . Smokeless tobacco: Never Used  Vaping Use  . Vaping Use: Never used  Substance and Sexual Activity  . Alcohol use: No  . Drug use: No  . Sexual activity: Not Currently    Birth control/protection: Surgical  Other Topics Concern  . Not on file  Social History Narrative   No regular exercise      Pt is right handed, she occasionally drinks tea, walks QOD. She lives with her 8 yr old son, he has mild cerebral palsy.   Social Determinants of Health   Financial Resource Strain:   . Difficulty of Paying Living Expenses: Not on file  Food Insecurity:   . Worried About Programme researcher, broadcasting/film/video in the Last Year: Not on file  . Ran Out of Food in the Last Year: Not on file  Transportation Needs:   . Lack of Transportation (Medical): Not on file  . Lack of Transportation (Non-Medical): Not on file  Physical  Activity:   . Days of Exercise per Week: Not on file  . Minutes of Exercise per Session: Not on file  Stress:   . Feeling of Stress : Not on file  Social Connections:   . Frequency of Communication with Friends and Family: Not on file  . Frequency of Social Gatherings with Friends and Family: Not on file  . Attends Religious Services: Not on file  . Active Member of Clubs or Organizations: Not on file  . Attends Banker Meetings: Not on file  . Marital Status: Not on file  Intimate Partner Violence:   . Fear of Current or Ex-Partner: Not on file  . Emotionally Abused: Not on file  . Physically Abused: Not on file  . Sexually Abused: Not on file    Review of Systems:    Constitutional: No fever or chills Skin: No rash Cardiovascular: No chest pain Respiratory: No SOB Gastrointestinal: See HPI and otherwise negative Genitourinary: No dysuria Neurological: No headache, dizziness or syncope Musculoskeletal: No new muscle or joint pain Hematologic: No bleeding or bruising Psychiatric: No history of depression or anxiety   Physical Exam:  Vital signs: BP 120/68   Pulse 70   Ht 5' 5.5" (1.664 m)   Wt 190 lb (86.2 kg)   BMI 31.14 kg/m   Constitutional:   Pleasant AA female appears to be in NAD, Well developed, Well nourished, alert and cooperative Head:  Normocephalic and atraumatic. Eyes:   PEERL, EOMI. No icterus. Conjunctiva pink. Ears:  Normal auditory acuity. Neck:  Supple Throat: Oral cavity and pharynx without inflammation, swelling or lesion.  Respiratory: Respirations even and unlabored. Lungs clear to auscultation bilaterally.   No wheezes, crackles, or rhonchi.  Cardiovascular: Normal S1, S2. No MRG. Regular rate and rhythm. No peripheral edema, cyanosis or pallor.  Gastrointestinal:  Soft, nondistended, moderate epigastric pain with involuntary guarding, Normal bowel sounds. No appreciable masses or hepatomegaly. Rectal:  Not performed.  Msk:   Symmetrical without gross deformities. Without edema, no deformity or joint abnormality.  Neurologic:  Alert and  oriented x4;  grossly normal neurologically.  Skin:   Dry and intact without significant lesions or rashes. Psychiatric: Demonstrates good judgement and reason without abnormal affect or behaviors.  RELEVANT LABS AND IMAGING: CBC    Component Value Date/Time   WBC 8.2 05/10/2020 2230   RBC 4.39 05/10/2020 2230   HGB 13.8 05/10/2020 2230   HCT 39.2 05/10/2020 2230   PLT 261 05/10/2020 2230   MCV 89.3 05/10/2020 2230   MCH 31.4 05/10/2020 2230   MCHC 35.2 05/10/2020 2230   RDW 14.0 05/10/2020 2230   LYMPHSABS 1,839 05/06/2020 1149   MONOABS 0.6 09/12/2018 1545   EOSABS 78 05/06/2020 1149   BASOSABS 43 05/06/2020 1149    CMP     Component Value Date/Time   NA 139 05/10/2020 2230   K 3.4 (L) 05/10/2020 2230   CL 98 05/10/2020 2230   CO2 31 05/10/2020 2230   GLUCOSE 95 05/10/2020 2230   BUN 9 05/10/2020 2230   CREATININE 0.71 05/10/2020 2230   CREATININE 0.80 05/06/2020 1149   CALCIUM 9.4 05/10/2020 2230   PROT 8.2 (H) 05/10/2020 2230   ALBUMIN 4.4 05/10/2020 2230   AST 21 05/10/2020 2230   ALT 15 05/10/2020 2230   ALKPHOS 72 05/10/2020 2230   BILITOT 1.3 (H) 05/10/2020 2230   GFRNONAA >60 05/10/2020 2230   GFRNONAA 84 05/06/2020 1149   GFRAA >60 05/10/2020 2230   GFRAA 98 05/06/2020 1149    Assessment: 1.  Epigastric pain: Worse over the past month with nausea and some diarrhea; consider gastritis+/-H. Pylori+/-PUD versus other 2.  Nausea 3.  Diarrhea: Consider relation to epigastric symptoms versus infectious cause versus other 4. Weight loss: 10# /last mos per patient with above  Plan: 1.  Scheduled patient for an EGD.  She requested Dr. Orvan Falconer.  She was offered an appointment on Friday but declined this appointment because it was in the afternoon.  She was also offered to schedule with a different provider but declined.  Did discuss  risks, benefits,  limitations and alternatives and the patient agrees to proceed.  Patient has had her Covid vaccines. 2.  Start the patient on Pantoprazole 40 mg twice daily, 30-60 minutes before breakfast and dinner #60 with 3 refills. 3.  Reviewed antireflux diet and lifestyle modifications. 4.  Ordered a GI pathogen panel and fecal lactoferrin 5.  Refilled patient's Phenergan as she tells me that Zofran does not work for her.  Prescribed 25 mg tabs every 4-6 hours as needed for nausea #30 with 1 refill. 6.  Patient to follow in clinic for recommendations from Dr. Orvan Falconer after time procedure.  Hyacinth Meeker, PA-C [facility] 06/09/2020, 12:38 PM  Cc: Pincus Sanes, MD

## 2020-06-09 NOTE — Patient Instructions (Signed)
If you are age 53 or older, your body mass index should be between 23-30. Your Body mass index is 31.14 kg/m. If this is out of the aforementioned range listed, please consider follow up with your Primary Care Provider.  If you are age 50 or younger, your body mass index should be between 19-25. Your Body mass index is 31.14 kg/m. If this is out of the aformentioned range listed, please consider follow up with your Primary Care Provider.   Your provider has requested that you go to the basement level for lab work before leaving today. Press "B" on the elevator. The lab is located at the first door on the left as you exit the elevator.  We have sent the following medications to your pharmacy for you to pick up at your convenience:   Due to recent changes in healthcare laws, you may see the results of your imaging and laboratory studies on MyChart before your provider has had a chance to review them.  We understand that in some cases there may be results that are confusing or concerning to you. Not all laboratory results come back in the same time frame and the provider may be waiting for multiple results in order to interpret others.  Please give [place] 48 hours in order for your provider to thoroughly review all the results before contacting the office for clarification of your results.

## 2020-06-24 ENCOUNTER — Ambulatory Visit: Payer: Medicare PPO | Admitting: Gastroenterology

## 2020-06-24 ENCOUNTER — Telehealth: Payer: Self-pay | Admitting: *Deleted

## 2020-06-24 ENCOUNTER — Other Ambulatory Visit: Payer: Self-pay | Admitting: Gastroenterology

## 2020-06-24 DIAGNOSIS — Z1159 Encounter for screening for other viral diseases: Secondary | ICD-10-CM | POA: Diagnosis not present

## 2020-06-24 LAB — SARS CORONAVIRUS 2 (TAT 6-24 HRS): SARS Coronavirus 2: NEGATIVE

## 2020-06-24 NOTE — Telephone Encounter (Signed)
Error

## 2020-06-25 ENCOUNTER — Telehealth: Payer: Self-pay | Admitting: Physician Assistant

## 2020-06-25 NOTE — Telephone Encounter (Signed)
I do not have a message attached.

## 2020-06-26 ENCOUNTER — Encounter: Payer: Self-pay | Admitting: Gastroenterology

## 2020-06-26 ENCOUNTER — Other Ambulatory Visit: Payer: Self-pay

## 2020-06-26 ENCOUNTER — Ambulatory Visit (AMBULATORY_SURGERY_CENTER): Payer: Medicare PPO | Admitting: Gastroenterology

## 2020-06-26 VITALS — BP 118/84 | HR 68 | Temp 97.9°F | Resp 15 | Ht 65.0 in | Wt 190.0 lb

## 2020-06-26 DIAGNOSIS — K2970 Gastritis, unspecified, without bleeding: Secondary | ICD-10-CM

## 2020-06-26 DIAGNOSIS — R197 Diarrhea, unspecified: Secondary | ICD-10-CM | POA: Diagnosis not present

## 2020-06-26 DIAGNOSIS — K209 Esophagitis, unspecified without bleeding: Secondary | ICD-10-CM | POA: Diagnosis not present

## 2020-06-26 DIAGNOSIS — K319 Disease of stomach and duodenum, unspecified: Secondary | ICD-10-CM | POA: Diagnosis not present

## 2020-06-26 DIAGNOSIS — R634 Abnormal weight loss: Secondary | ICD-10-CM

## 2020-06-26 DIAGNOSIS — K2980 Duodenitis without bleeding: Secondary | ICD-10-CM | POA: Diagnosis not present

## 2020-06-26 DIAGNOSIS — K2289 Other specified disease of esophagus: Secondary | ICD-10-CM | POA: Diagnosis not present

## 2020-06-26 DIAGNOSIS — R110 Nausea: Secondary | ICD-10-CM | POA: Diagnosis not present

## 2020-06-26 DIAGNOSIS — R1013 Epigastric pain: Secondary | ICD-10-CM | POA: Diagnosis not present

## 2020-06-26 DIAGNOSIS — K3189 Other diseases of stomach and duodenum: Secondary | ICD-10-CM

## 2020-06-26 MED ORDER — SODIUM CHLORIDE 0.9 % IV SOLN
4.0000 mg | Freq: Once | INTRAVENOUS | Status: AC
  Administered 2020-06-26: 4 mg via INTRAVENOUS

## 2020-06-26 MED ORDER — ONDANSETRON HCL 4 MG/5ML PO SOLN: 4.0000 mg | Freq: Once | ORAL | Status: DC

## 2020-06-26 MED ORDER — ONDANSETRON 4 MG PO TBDP: ORAL_TABLET | ORAL | 3 refills | Status: DC

## 2020-06-26 MED ORDER — SODIUM CHLORIDE 0.9 % IV SOLN: 4.0000 mg | Freq: Once | INTRAVENOUS | Status: DC

## 2020-06-26 MED ORDER — SODIUM CHLORIDE 0.9 % IV SOLN: 500.0000 mL | Freq: Once | INTRAVENOUS | Status: DC

## 2020-06-26 NOTE — Patient Instructions (Signed)
YOU HAD AN ENDOSCOPIC PROCEDURE TODAY AT THE [facility]:   Refer to the procedure report that was given to you for any specific questions about what was found during the examination.  If the procedure report does not answer your questions, please call your gastroenterologist to clarify.  If you requested that your care partner not be given the details of your procedure findings, then the procedure report has been included in a sealed envelope for you to review at your convenience later.  YOU SHOULD EXPECT: Some feelings of bloating in the abdomen. Passage of more gas than usual.  Walking can help get rid of the air that was put into your GI tract during the procedure and reduce the bloating. If you had a lower endoscopy (such as a colonoscopy or flexible sigmoidoscopy) you may notice spotting of blood in your stool or on the toilet paper. If you underwent a bowel prep for your procedure, you may not have a normal bowel movement for a few days.  Please Note:  You might notice some irritation and congestion in your nose or some drainage.  This is from the oxygen used during your procedure.  There is no need for concern and it should clear up in a day or so.  SYMPTOMS TO REPORT IMMEDIATELY:    Following upper endoscopy (EGD)  Vomiting of blood or coffee ground material  New chest pain or pain under the shoulder blades  Painful or persistently difficult swallowing  New shortness of breath  Fever of 100F or higher  Black, tarry-looking stools  For urgent or emergent issues, a gastroenterologist can be reached at any hour by calling [phone]. Do not use MyChart messaging for urgent concerns.    DIET:  We do recommend a small meal at first, but then you may proceed to your regular diet.  Drink plenty of fluids but you should avoid alcoholic beverages for 24 hours.  ACTIVITY:  You should plan to take it easy for the rest of today and you should NOT DRIVE or use heavy machinery  until tomorrow (because of the sedation medicines used during the test).    FOLLOW UP: Our staff will call the number listed on your records 48-72 hours following your procedure to check on you and address any questions or concerns that you may have regarding the information given to you following your procedure. If we do not reach you, we will leave a message.  We will attempt to reach you two times.  During this call, we will ask if you have developed any symptoms of COVID 19. If you develop any symptoms (ie: fever, flu-like symptoms, shortness of breath, cough etc.) before then, please call [phone].  If you test positive for Covid 19 in the 2 weeks post procedure, please call and report this information to us.    If any biopsies were taken you will be contacted by phone or by letter within the next 1-3 weeks.  Please call us at [phone] if you have not heard about the biopsies in 3 weeks.    SIGNATURES/CONFIDENTIALITY: You and/or your care partner have signed paperwork which will be entered into your electronic medical record.  These signatures attest to the fact that that the information above on your After Visit Summary has been reviewed and is understood.  Full responsibility of the confidentiality of this discharge information lies with you and/or your care-partner. 

## 2020-06-26 NOTE — Progress Notes (Signed)
Called to room to assist during endoscopic procedure.  Patient ID and intended procedure confirmed with present staff. Received instructions for my participation in the procedure from the performing physician.  

## 2020-06-26 NOTE — Op Note (Signed)
[facility] Patient Name: Kathleen Solis Procedure Date: 06/26/2020 9:42 AM MRN: [id number] Endoscopist: Tressia Danas MD, MD Age: 54 Referring MD:  Date of Birth: 03-Jul-1966 Gender: Female Account #: [id number] Procedure:                Upper GI endoscopy Indications:              Epigastric pai, nausea, diarrhea, greater than 10                            pound weight loss Medicines:                Monitored Anesthesia Care Procedure:                Pre-Anesthesia Assessment:                           - Prior to the procedure, a History and Physical                            was performed, and patient medications and                            allergies were reviewed. The patient's tolerance of                            previous anesthesia was also reviewed. The risks                            and benefits of the procedure and the sedation                            options and risks were discussed with the patient.                            All questions were answered, and informed consent                            was obtained. Prior Anticoagulants: The patient has                            taken no previous anticoagulant or antiplatelet                            agents. ASA Grade Assessment: II - A patient with                            mild systemic disease. After reviewing the risks                            and benefits, the patient was deemed in                            satisfactory condition to undergo the procedure.  After obtaining informed consent, the endoscope was                            passed under direct vision. Throughout the                            procedure, the patient's blood pressure, pulse, and                            oxygen saturations were monitored continuously. The                            Endoscope was introduced through the mouth, and                            advanced to the third part of  duodenum. The upper                            GI endoscopy was accomplished without difficulty.                            The patient tolerated the procedure well. Scope In: Scope Out: Findings:                 Multiple, small white plaques were found in the                            middle third of the esophagus suspicious for                            candida. Biopsies were taken from the mid/proximal                            and distal esophagus with a cold forceps for                            histology. Estimated blood loss was minimal.                           Diffuse mildly erythematous mucosa without bleeding                            was found in the gastric body. Biopsies were taken                            from the antrum, body, and fundus with a cold                            forceps for histology. Estimated blood loss was                            minimal.  The examined duodenum was normal. Biopsies were                            taken with a cold forceps for histology. Estimated                            blood loss was minimal.                           The cardia and gastric fundus were normal on                            retroflexion.                           The exam was otherwise without abnormality. Complications:            No immediate complications. Estimated blood loss:                            Minimal. Estimated Blood Loss:     Estimated blood loss was minimal. Impression:               - Esophageal plaques were found, suspicious for                            candidiasis. Biopsied.                           - Erythematous mucosa in the gastric body. Biopsied.                           - Normal examined duodenum. Biopsied.                           - The examination was otherwise normal. Recommendation:           - Patient has a contact number available for                            emergencies. The signs and symptoms  of potential                            delayed complications were discussed with the                            patient. Return to normal activities tomorrow.                            Written discharge instructions were provided to the                            patient.                           - Resume previous diet.                           -  Continue present medications.                           - Await pathology results.                           - Additional recommendations after the biopsy                            results are reviewed. Tressia Danas MD, MD 06/26/2020 11:08:53 AM This report has been signed electronically.

## 2020-06-26 NOTE — Progress Notes (Signed)
pt tolerated well. VSS. awake and to recovery. Report given to RN. Oral bite block placed atraumatic. Left in place to recovery.

## 2020-06-29 ENCOUNTER — Other Ambulatory Visit: Payer: Self-pay

## 2020-06-29 ENCOUNTER — Encounter: Payer: Self-pay | Admitting: Internal Medicine

## 2020-06-29 ENCOUNTER — Telehealth (INDEPENDENT_AMBULATORY_CARE_PROVIDER_SITE_OTHER): Payer: Medicare PPO | Admitting: Internal Medicine

## 2020-06-29 DIAGNOSIS — J069 Acute upper respiratory infection, unspecified: Secondary | ICD-10-CM | POA: Insufficient documentation

## 2020-06-29 MED ORDER — PROMETHAZINE-DM 6.25-15 MG/5ML PO SYRP: 5.0000 mL | ORAL_SOLUTION | Freq: Four times a day (QID) | ORAL | 0 refills | Status: DC | PRN

## 2020-06-29 MED ORDER — FLUTICASONE PROPIONATE 50 MCG/ACT NA SUSP: 2.0000 | Freq: Every day | NASAL | 6 refills | Status: DC

## 2020-06-29 NOTE — Assessment & Plan Note (Signed)
Acute Symptoms consistent with viral URI Do not think she needs antibiotics at this time Discussed symptomatic treatment She would like some cough syrup-promethazine is the only medication that seems to work for her-I advised I will give her 1 prescription only and will not refill Advised Zyrtec Flonase refilled Use inhalers Call if no improvement or if symptoms worsen

## 2020-06-29 NOTE — Progress Notes (Signed)
Virtual Visit via Video Note  I connected with Kathleen Solis on 06/29/20 at  3:00 PM EST by a video enabled telemedicine application and verified that I am speaking with the correct person using two identifiers.   I discussed the limitations of evaluation and management by telemedicine and the availability of in person appointments. The patient expressed understanding and agreed to proceed.  Present for the visit:  Myself, Dr Cheryll Cockayne, Kathleen Solis.  The patient is currently at home and I am in the office.    No referring provider.    History of Present Illness: She is here for an acute visit for cold symptoms.   Her symptoms started this past weekend - a couple of days ago.  It got worse last night.   She is experiencing cough with congestion in the chest.  She also states headaches.  She denies fevers, shortness of breath or wheezing.  She has tried taking delsym, which does not help.  She is taking her symbicort twice a day.     Review of Systems  Constitutional: Negative for fever.  HENT: Negative for congestion, ear pain, sinus pain and sore throat.   Respiratory: Positive for cough (congestion in chest). Negative for shortness of breath and wheezing.   Gastrointestinal: Negative for abdominal pain, diarrhea, heartburn and nausea.  Neurological: Positive for headaches.      Social History   Socioeconomic History  . Marital status: Single    Spouse name: Not on file  . Number of children: 2  . Years of education: Not on file  . Highest education level: Associate degree: academic program  Occupational History  . Occupation: disabled  Tobacco Use  . Smoking status: Never Smoker  . Smokeless tobacco: Never Used  Vaping Use  . Vaping Use: Never used  Substance and Sexual Activity  . Alcohol use: No  . Drug use: No  . Sexual activity: Not Currently    Birth control/protection: Surgical  Other Topics Concern  . Not on file  Social History Narrative   No  regular exercise      Pt is right handed, she occasionally drinks tea, walks QOD. She lives with her 88 yr old son, he has mild cerebral palsy.   Social Determinants of Health   Financial Resource Strain:   . Difficulty of Paying Living Expenses: Not on file  Food Insecurity:   . Worried About Programme researcher, broadcasting/film/video in the Last Year: Not on file  . Ran Out of Food in the Last Year: Not on file  Transportation Needs:   . Lack of Transportation (Medical): Not on file  . Lack of Transportation (Non-Medical): Not on file  Physical Activity:   . Days of Exercise per Week: Not on file  . Minutes of Exercise per Session: Not on file  Stress:   . Feeling of Stress : Not on file  Social Connections:   . Frequency of Communication with Friends and Family: Not on file  . Frequency of Social Gatherings with Friends and Family: Not on file  . Attends Religious Services: Not on file  . Active Member of Clubs or Organizations: Not on file  . Attends Banker Meetings: Not on file  . Marital Status: Not on file     Observations/Objective: Appears well in NAD Hoarse sounding, occasional dry hacking cough Breathing normally without shortness of breath Skin appears warm and dry   Assessment and Plan:  See Problem List for Assessment and Plan  of chronic medical problems.   Follow Up Instructions:    I discussed the assessment and treatment plan with the patient. The patient was provided an opportunity to ask questions and all were answered. The patient agreed with the plan and demonstrated an understanding of the instructions.   The patient was advised to call back or seek an in-person evaluation if the symptoms worsen or if the condition fails to improve as anticipated.    Binnie Rail, MD

## 2020-06-30 ENCOUNTER — Telehealth: Payer: Self-pay

## 2020-06-30 DIAGNOSIS — R300 Dysuria: Secondary | ICD-10-CM | POA: Diagnosis not present

## 2020-06-30 DIAGNOSIS — Z01419 Encounter for gynecological examination (general) (routine) without abnormal findings: Secondary | ICD-10-CM | POA: Diagnosis not present

## 2020-06-30 NOTE — Telephone Encounter (Signed)
  Follow up Call-  Call back number 06/26/2020  Post procedure Call Back phone  # [phone]  Permission to leave phone message Yes  Some recent data might be hidden     Patient questions:  Do you have a fever, pain , or abdominal swelling? No. Pain Score  0 *  Have you tolerated food without any problems? Yes.    Have you been able to return to your normal activities? Yes.    Do you have any questions about your discharge instructions: Diet   No. Medications  No. Follow up visit  No.  Do you have questions or concerns about your Care? No.  Actions: * If pain score is 4 or above: No action needed, pain <4.  1. Have you developed a fever since your procedure? no  2.   Have you had an respiratory symptoms (SOB or cough) since your procedure? no  3.   Have you tested positive for COVID 19 since your procedure no  4.   Have you had any family members/close contacts diagnosed with the COVID 19 since your procedure?  no   If yes to any of these questions please route to Laverna Peace, RN and Karlton Lemon, RN

## 2020-07-02 ENCOUNTER — Telehealth: Payer: Self-pay | Admitting: Gastroenterology

## 2020-07-02 NOTE — Telephone Encounter (Signed)
I am still waiting on the full report from pathology - as PAS stains are still pending and pathology noted they will report them in an addendum.

## 2020-07-02 NOTE — Telephone Encounter (Signed)
See result note.  

## 2020-07-02 NOTE — Telephone Encounter (Signed)
Pt calling for EGD results. Please advise.

## 2020-07-03 ENCOUNTER — Ambulatory Visit: Payer: Medicare PPO

## 2020-07-10 ENCOUNTER — Telehealth: Payer: Self-pay | Admitting: Physician Assistant

## 2020-07-10 MED ORDER — OMEPRAZOLE 40 MG PO CPDR: 40.0000 mg | DELAYED_RELEASE_CAPSULE | Freq: Two times a day (BID) | ORAL | 3 refills | Status: DC

## 2020-07-10 NOTE — Telephone Encounter (Signed)
She could switch the pantoprazole to 40 mg BID to omeprazole 40 mg BID. Ideally, she would complete at least 10 weeks of treatment total. Thank you.

## 2020-07-10 NOTE — Telephone Encounter (Signed)
Pt states she has been taking the protonix since October but last week she states she started having a headache, feeling weak and having diarrhea. Discussed with pt that she may have a stomach virus as she just started feeling bad last week and she has been taking the protonix since October. Pt states she just doesn't know but she is not going to take the protonix any more. Please advise.

## 2020-07-10 NOTE — Telephone Encounter (Signed)
Pt aware and script sent to pharmacy. 

## 2020-07-10 NOTE — Telephone Encounter (Signed)
This pt had a procedure with Dr Orvan Falconer on 11/5.  Please route accordingly. Thank you

## 2020-07-10 NOTE — Telephone Encounter (Signed)
Pt states that Protonix is not agreeing with her stomach; pt is still very nauseated and weak. She would like some advise.

## 2020-07-12 DIAGNOSIS — J069 Acute upper respiratory infection, unspecified: Secondary | ICD-10-CM | POA: Diagnosis not present

## 2020-07-12 DIAGNOSIS — B349 Viral infection, unspecified: Secondary | ICD-10-CM | POA: Diagnosis not present

## 2020-07-12 DIAGNOSIS — R0981 Nasal congestion: Secondary | ICD-10-CM | POA: Diagnosis not present

## 2020-07-12 DIAGNOSIS — H6983 Other specified disorders of Eustachian tube, bilateral: Secondary | ICD-10-CM | POA: Diagnosis not present

## 2020-07-12 DIAGNOSIS — R059 Cough, unspecified: Secondary | ICD-10-CM | POA: Diagnosis not present

## 2020-07-12 DIAGNOSIS — Z03818 Encounter for observation for suspected exposure to other biological agents ruled out: Secondary | ICD-10-CM | POA: Diagnosis not present

## 2020-07-21 ENCOUNTER — Telehealth: Payer: Self-pay | Admitting: Emergency Medicine

## 2020-07-21 NOTE — Telephone Encounter (Signed)
Called and spoke with pt and she is aware of RB recs.  She will contact the office around December the 8th for the refill.

## 2020-07-21 NOTE — Telephone Encounter (Signed)
I would be okay to refill this for her on December 8, 1 month after her most recent.  At that time we can give her 240 cc which should last her longer

## 2020-07-21 NOTE — Telephone Encounter (Signed)
Called and spoke with pt and she stated that she is still having the cough and the cough is worse at night.  She has a pending appt with RB on 1/6 for follow up of the cough/asthma.  She is requesting a refill of the promethazine DM to help with the cough.  This was last filled by Dr. Kennyth Arnold burns on 06/29/2020 for #118 ML.   I advised the pt of this refill and told her it would be too early for a refill but will send her message to RB for further recs.  RB please advise. Thanks ]  Allergies  Allergen Reactions  . Benzonatate Other (See Comments)    Headaches  . Darvon [Propoxyphene] Other (See Comments)    Gi problems  . Ibuprofen     tinnitus  . Morphine And Related Itching  . Prednisone Other (See Comments)    Make her head hurt  . Sulfa Antibiotics Hives  . Tussionex Pennkinetic Er [Hydrocod Polst-Cpm Polst Er] Other (See Comments)    Nausea, vomiting  . Buprenorphine Hcl Itching  . Ivp Dye [Iodinated Diagnostic Agents] Nausea And Vomiting  . Prochlorperazine Edisylate Itching and Other (See Comments)    "Acts weird: Hallucinations"     Current Outpatient Medications on File Prior to Visit  Medication Sig Dispense Refill  . acetaminophen (TYLENOL) 500 MG tablet Take 500 mg by mouth every 6 (six) hours as needed.    Marland Kitchen atorvastatin (LIPITOR) 10 MG tablet Take 1 tablet (10 mg total) by mouth at bedtime. 90 tablet 1  . budesonide-formoterol (SYMBICORT) 160-4.5 MCG/ACT inhaler Inhale 2 puffs into the lungs 2 (two) times daily. 1 Inhaler 5  . fluticasone (FLONASE) 50 MCG/ACT nasal spray Place 2 sprays into both nostrils daily. 16 g 6  . hydrochlorothiazide (HYDRODIURIL) 25 MG tablet Take 1 tablet by mouth once daily 90 tablet 1  . metoprolol succinate (TOPROL-XL) 100 MG 24 hr tablet Take 1 tablet (100 mg total) by mouth at bedtime. 90 tablet 1  . omeprazole (PRILOSEC) 40 MG capsule Take 1 capsule (40 mg total) by mouth in the morning and at bedtime. 60 capsule 3  . ondansetron  (ZOFRAN-ODT) 4 MG disintegrating tablet Take 1-2 tabs every 4 hours as needed for nausea 20 tablet 3  . pantoprazole (PROTONIX) 40 MG tablet Take 1 tablet (40 mg total) by mouth 2 (two) times daily. 60 tablet 2  . promethazine-dextromethorphan (PROMETHAZINE-DM) 6.25-15 MG/5ML syrup Take 5 mLs by mouth 4 (four) times daily as needed for cough. 118 mL 0   No current facility-administered medications on file prior to visit.

## 2020-07-29 ENCOUNTER — Telehealth: Payer: Self-pay | Admitting: Emergency Medicine

## 2020-07-29 NOTE — Telephone Encounter (Signed)
Patient scheduled with Dr. Delton Coombes 08/27/2020. Needs refill. Patient phone number is [phone].

## 2020-07-29 NOTE — Telephone Encounter (Signed)
07/29/20   Contacted patient.  Left voicemail.  Left detailed voicemail requesting the patient have follow-up with our office as patient was last seen virtually by Dr. Delton Coombes in February/2021.  She was requested to have a 6-week follow-up at that point in time which was never completed.  When patient calls back please schedule her for a follow-up visit with Dr. Delton Coombes.  If there is nothing available that suitable for the patient's timeframe can schedule with APP.  Elisha Headland, FNP

## 2020-07-29 NOTE — Telephone Encounter (Signed)
ATC Patient.  LM to call back. 

## 2020-07-30 MED ORDER — PROMETHAZINE-DM 6.25-15 MG/5ML PO SYRP: 5.0000 mL | ORAL_SOLUTION | Freq: Four times a day (QID) | ORAL | 0 refills | Status: DC | PRN

## 2020-07-30 NOTE — Telephone Encounter (Signed)
Per Dr. Delton Coombes, ok to refill promethazine-DM after December 8th 2021. She has a follow-up apt scheduled for early January 2022. Rx sent.

## 2020-07-30 NOTE — Telephone Encounter (Signed)
Pt is wanting a call back from a nurse regarding her medications refill . Please advise

## 2020-08-13 ENCOUNTER — Other Ambulatory Visit: Payer: Self-pay

## 2020-08-13 ENCOUNTER — Ambulatory Visit
Admission: RE | Admit: 2020-08-13 | Discharge: 2020-08-13 | Disposition: A | Discharge status: Home / Self Care | Payer: Medicare PPO | Source: Ambulatory Visit | Attending: Obstetrics and Gynecology | Admitting: Obstetrics and Gynecology

## 2020-08-13 DIAGNOSIS — Z1231 Encounter for screening mammogram for malignant neoplasm of breast: Secondary | ICD-10-CM | POA: Diagnosis not present

## 2020-08-19 DIAGNOSIS — N393 Stress incontinence (female) (male): Secondary | ICD-10-CM | POA: Diagnosis not present

## 2020-08-19 DIAGNOSIS — N9489 Other specified conditions associated with female genital organs and menstrual cycle: Secondary | ICD-10-CM | POA: Diagnosis not present

## 2020-08-27 ENCOUNTER — Encounter: Payer: Self-pay | Admitting: Emergency Medicine

## 2020-08-27 ENCOUNTER — Other Ambulatory Visit: Payer: Self-pay

## 2020-08-27 ENCOUNTER — Ambulatory Visit: Payer: Medicare PPO | Admitting: Emergency Medicine

## 2020-08-27 DIAGNOSIS — R059 Cough, unspecified: Secondary | ICD-10-CM

## 2020-08-27 DIAGNOSIS — J309 Allergic rhinitis, unspecified: Secondary | ICD-10-CM | POA: Diagnosis not present

## 2020-08-27 DIAGNOSIS — J4520 Mild intermittent asthma, uncomplicated: Secondary | ICD-10-CM | POA: Diagnosis not present

## 2020-08-27 DIAGNOSIS — J45909 Unspecified asthma, uncomplicated: Secondary | ICD-10-CM | POA: Insufficient documentation

## 2020-08-27 HISTORY — DX: Unspecified asthma, uncomplicated: J45.909

## 2020-08-27 MED ORDER — PROMETHAZINE-DM 6.25-15 MG/5ML PO SYRP: 5.0000 mL | ORAL_SOLUTION | Freq: Four times a day (QID) | ORAL | 3 refills | Status: DC | PRN

## 2020-08-27 MED ORDER — BUDESONIDE-FORMOTEROL FUMARATE 160-4.5 MCG/ACT IN AERO: 2.0000 | INHALATION_SPRAY | Freq: Two times a day (BID) | RESPIRATORY_TRACT | 5 refills | Status: DC

## 2020-08-27 MED ORDER — ALBUTEROL SULFATE HFA 108 (90 BASE) MCG/ACT IN AERS: 2.0000 | INHALATION_SPRAY | Freq: Four times a day (QID) | RESPIRATORY_TRACT | 6 refills | Status: DC | PRN

## 2020-08-27 NOTE — Addendum Note (Signed)
Addended by: Leslye Peer on: 08/27/2020 10:58 AM   Modules accepted: Orders

## 2020-08-27 NOTE — Assessment & Plan Note (Signed)
Well-controlled at this time.  She is still on Symbicort, was able to get this although I was concerned it was going to be off her insurance formulary.  She needs an albuterol to use for rescue.

## 2020-08-27 NOTE — Progress Notes (Signed)
Subjective:    Patient ID: Kathleen Solis, female    DOB: [birth year], 55 y.o.   MRN: [id number]  HPI 55 year old never smoker with a history of hyperlipidemia, hypertension, cerebrovascular disease with history of TIA, allergic rhinitis.  She is referred today for evaluation of chronic cough.  She reports that the cough began about 2 months ago.  In retrospect she has had bouts of cough before, often treated as bronchitis, much shorter lived. Is particularly bad at night when supine. Can wake her from sleep, sleeps on 2 pillows. Non-productive. She has had some off/on sore throat, including at the beginning of the illness, along w viral sx, body aches. She does have some nasal gtt.  No overt reflux sx.   She was treated with azithromycin and then doxycycline without any resolution.  She was tried on Breo for about a week early in the course. Has received cough suppression with moderate success.   Chest x-ray from 07/10/2018 reviewed, shows no infiltrates or any other abnormalities.  Lung windows from a CT scan of the abdomen from 09/13/2018 did not show any abnormalities either.  ROV 06/04/2019 --this is a follow-up visit 55 year old woman with hypertension, cerebrovascular disease with a TIA, allergic rhinitis.  We have been following for chronic cough of about 3 months duration.  She has been using loratadine, Chlortab bedtime, pantoprazole.  She started fluticasone nasal spray in September.  Was also treated with promethazine/codeine cough suppression, tessalon perles and an empiric trial of Medrol > couldn't take due to HA.  She returns today reporting that she continues to cough, probably less frequently. She is having cough significant at night, can wake her from sleep. In retrospect she believes that albuterol may have helped her some, made her cough a bit better.  Pulmonary function testing today reviewed by me, shows normal prebronchodilator airflows but a clear bronchodilator response  consistent with mild asthma.  Lung volumes and diffusion capacity normal.  ROV 08/27/20 --55 year old woman with hypertension, history of TIA, allergic rhinitis and GERD.  She been dealing with chronic cough due to the above.  She also has some mild obstruction on spirometry.  We were going Symbicort because it was not preferred by her insurance - but she was able to get it covered.  She has used promethazine DM for cough suppression, uses it at bedtime. Currently on flonase, has not been helped by antihistamine before. She underwent skin testing, sensitive to to multiple pollens, mold, cockroaches. She started [place], did not start allegra or dymista or eyedrops that were discussed in her notes. Overall her breathing and functional capacity is good. She does a lot of throat clearing, using ginger hard candies. She does not feel any GERD on the omeprazole bid. She is out of the promethazine, doesn't get benefit from delsym. She wants to avoid codeine.    Review of Systems  Constitutional: Negative for fever and unexpected weight change.  HENT: Negative for congestion, dental problem, ear pain, nosebleeds, postnasal drip, rhinorrhea, sinus pressure, sneezing, sore throat and trouble swallowing.   Eyes: Negative for redness and itching.  Respiratory: Positive for cough. Negative for chest tightness, shortness of breath and wheezing.   Cardiovascular: Negative for palpitations and leg swelling.  Gastrointestinal: Negative for nausea and vomiting.  Genitourinary: Negative for dysuria.  Musculoskeletal: Negative for joint swelling.  Skin: Negative for rash.  Neurological: Negative for headaches.  Hematological: Does not bruise/bleed easily.  Psychiatric/Behavioral: Negative for dysphoric mood. The patient is not nervous/anxious.  Past Medical History:  Diagnosis Date  . Asthma   . Diverticulitis   . GERD (gastroesophageal reflux disease)   . Hyperlipidemia    diet controlled - no med  .  Hypertension   . Sciatica    right  . Seasonal allergies   . Stroke [facility]) 2000   several mini strokes, weakness on right arm and leg  . SVD (spontaneous vaginal delivery)    x 2     Family History  Problem Relation Age of Onset  . Breast cancer Sister   . Diabetes Sister   . Glaucoma Sister   . Cataracts Sister   . Hypertension Sister   . Anuerysm Mother   . Pancreatic cancer Father   . Retinal detachment Brother   . Cataracts Sister   . Breast cancer Cousin   . Anuerysm Cousin   . Breast cancer Niece   . Esophageal cancer Nephew   . Colon polyps Neg Hx   . Prostate cancer Neg Hx   . Rectal cancer Neg Hx   . Stomach cancer Neg Hx   . Uterine cancer Neg Hx      Social History   Socioeconomic History  . Marital status: Single    Spouse name: Not on file  . Number of children: 2  . Years of education: Not on file  . Highest education level: Associate degree: academic program  Occupational History  . Occupation: disabled  Tobacco Use  . Smoking status: Never Smoker  . Smokeless tobacco: Never Used  Vaping Use  . Vaping Use: Never used  Substance and Sexual Activity  . Alcohol use: No  . Drug use: No  . Sexual activity: Not Currently    Birth control/protection: Surgical  Other Topics Concern  . Not on file  Social History Narrative   No regular exercise      Pt is right handed, she occasionally drinks tea, walks QOD. She lives with her 107 yr old son, he has mild cerebral palsy.   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 [place]  Allergies  Allergen Reactions  . Benzonatate Other (See Comments)    Headaches  . Darvon [Propoxyphene] Other (See Comments)    Gi problems  . Ibuprofen      tinnitus  . Morphine And Related Itching  . Prednisone Other (See Comments)    Make her head hurt  . Sulfa Antibiotics Hives  . Tussionex Pennkinetic Er [Hydrocod Polst-Cpm Polst Er] Other (See Comments)    Nausea, vomiting  . Buprenorphine Hcl Itching  . Ivp Dye [Iodinated Diagnostic Agents] Nausea And Vomiting  . Prochlorperazine Edisylate Itching and Other (See Comments)    "Acts weird: Hallucinations"     Outpatient Medications Prior to Visit  Medication Sig Dispense Refill  . acetaminophen (TYLENOL) 500 MG tablet Take 500 mg by mouth every 6 (six) hours as needed.    Marland Kitchen atorvastatin (LIPITOR) 10 MG tablet Take 1 tablet (10 mg total) by mouth at bedtime. 90 tablet 1  . budesonide-formoterol (SYMBICORT) 160-4.5 MCG/ACT inhaler Inhale 2 puffs into the lungs 2 (two) times daily. 1 Inhaler 5  . fluticasone (FLONASE) 50 MCG/ACT nasal spray Place 2 sprays into both nostrils daily.  16 g 6  . hydrochlorothiazide (HYDRODIURIL) 25 MG tablet Take 1 tablet by mouth once daily 90 tablet 1  . metoprolol succinate (TOPROL-XL) 100 MG 24 hr tablet Take 1 tablet (100 mg total) by mouth at bedtime. 90 tablet 1  . omeprazole (PRILOSEC) 40 MG capsule Take 1 capsule (40 mg total) by mouth in the morning and at bedtime. 60 capsule 3  . ondansetron (ZOFRAN-ODT) 4 MG disintegrating tablet Take 1-2 tabs every 4 hours as needed for nausea 20 tablet 3  . promethazine-dextromethorphan (PROMETHAZINE-DM) 6.25-15 MG/5ML syrup Take 5 mLs by mouth 4 (four) times daily as needed for cough. 240 mL 0  . pantoprazole (PROTONIX) 40 MG tablet Take 1 tablet (40 mg total) by mouth 2 (two) times daily. 60 tablet 2   No facility-administered medications prior to visit.        Objective:   Physical Exam Vitals:   08/27/20 1026  BP: 118/80  Pulse: 68  Temp: 98.4 F (36.9 C)  TempSrc: Temporal  SpO2: 98%  Weight: 189 lb (85.7 kg)  Height: 5\' 5"  (1.651 m)   Gen: Pleasant, well-nourished, in no distress,  normal  affect  ENT: No lesions,  mouth clear,  oropharynx clear, no postnasal drip, strong voice, throat clearing  Neck: No JVD, no stridor  Lungs: No use of accessory muscles, no crackles or wheezing on normal respiration, no wheeze on forced expiration  Cardiovascular: RRR, heart sounds normal, no murmur or gallops, no peripheral edema  Musculoskeletal: No deformities, no cyanosis or clubbing  Neuro: alert, awake, non focal  Skin: Warm, no lesions or rash      Assessment & Plan:  Allergic rhinitis Still poorly controlled and contributing to her upper airway instability.  She is on Flonase, did not change the Dymista or and Allegra as recommended at her allergy visit.  She did have positive skin tests.  I suspect that she will end up on immunotherapy, is plan to follow-up with them in March.  Question add Singulair?  Mild asthma Well-controlled at this time.  She is still on Symbicort, was able to get this although I was concerned it was going to be off her insurance formulary.  She needs an albuterol to use for rescue.  Cough Her GERD appears to be adequately controlled on omeprazole twice daily.  I think the allergic rhinitis is the driver here.  She did not start Allegra or change her nasal spray to Dymista as recommended on her allergy visit.  She is going back to be evaluated, reviewed her skin testing in March.  Question whether she will end up on immunotherapy.  Her cough does appear to be upper airway in nature and I believe GERD and allergies need to be aggressively treated.  She has benefited from promethazine DM and I am okay refilling this  April, MD, PhD 08/27/2020, 10:50 AM [facility] [phone] or if no answer [phone]

## 2020-08-27 NOTE — Patient Instructions (Signed)
Please continue Symbicort 2 puffs twice a day.  Rinse and gargle after using. We will prescribe an albuterol inhaler so that you can keep this available use 2 puffs if needed for shortness of breath, chest tightness, wheezing. Continue fluticasone nasal spray, 2 sprays each nostril once daily. Start Allegra (fexofenadine) 180 mg once daily.  You can get this over-the-counter. Follow-up with Allergy clinic in March as planned.  They may decide that you would benefit from allergy shots Continue your omeprazole twice a day Okay to use promethazine DM as needed for cough suppression Follow with Dr Delton Coombes in 6 months or sooner if you have any problems

## 2020-08-27 NOTE — Addendum Note (Signed)
Addended by: Maurene Capes on: 08/27/2020 11:06 AM   Modules accepted: Orders

## 2020-08-27 NOTE — Assessment & Plan Note (Addendum)
Still poorly controlled and contributing to her upper airway instability.  She is on Flonase, did not change the Dymista or and Allegra as recommended at her allergy visit.  She did have positive skin tests.  I suspect that she will end up on immunotherapy, is plan to follow-up with them in March.  Question add Singulair?

## 2020-08-27 NOTE — Assessment & Plan Note (Signed)
Her GERD appears to be adequately controlled on omeprazole twice daily.  I think the allergic rhinitis is the driver here.  She did not start Allegra or change her nasal spray to Dymista as recommended on her allergy visit.  She is going back to be evaluated, reviewed her skin testing in March.  Question whether she will end up on immunotherapy.  Her cough does appear to be upper airway in nature and I believe GERD and allergies need to be aggressively treated.  She has benefited from promethazine DM and I am okay refilling this

## 2020-09-22 ENCOUNTER — Other Ambulatory Visit: Payer: Medicare PPO

## 2020-09-22 DIAGNOSIS — Z20822 Contact with and (suspected) exposure to covid-19: Secondary | ICD-10-CM | POA: Diagnosis not present

## 2020-09-23 ENCOUNTER — Encounter: Payer: Self-pay | Admitting: Internal Medicine

## 2020-09-23 ENCOUNTER — Telehealth (INDEPENDENT_AMBULATORY_CARE_PROVIDER_SITE_OTHER): Payer: Medicare PPO | Admitting: Internal Medicine

## 2020-09-23 DIAGNOSIS — B349 Viral infection, unspecified: Secondary | ICD-10-CM | POA: Diagnosis not present

## 2020-09-23 LAB — SARS-COV-2, NAA 2 DAY TAT

## 2020-09-23 LAB — NOVEL CORONAVIRUS, NAA: SARS-CoV-2, NAA: NOT DETECTED

## 2020-09-23 NOTE — Progress Notes (Signed)
Virtual Visit via video note  I connected with Kathleen Solis on 09/23/20 at 11:15 AM EST by a video enabled telemedicine application and verified that I am speaking with the correct person using two identifiers.   I discussed the limitations of evaluation and management by telemedicine and the availability of in person appointments. The patient expressed understanding and agreed to proceed.  Present for the visit:  Myself, Dr Cheryll Cockayne, Kathleen Solis.  The patient is currently at home and I am in the office.    No referring provider.    History of Present Illness: This is an acute visit for cough, weakness and headache.  Her symptoms started about 1 week ago.  She states feeling hot and cold in my night having sweats.  She has not had any documented fevers.  She feels generalized weakness, fatigue and achy all over.  She states headaches, mild congestion, sore throat, fever blister, dry cough, nausea, vomiting x2, some abdominal pain that is intermittent.  She is is taking Tylenol.    She tested for covid  - pending.    Review of Systems  Constitutional: Positive for diaphoresis (sweats once) and malaise/fatigue. Negative for fever.       Has felt hot/ cold, dec appetite  HENT: Positive for congestion and sore throat. Negative for ear pain and sinus pain.        Fever blister on left side of lip  Respiratory: Positive for cough (dry). Negative for sputum production, shortness of breath and wheezing.   Cardiovascular: Negative for chest pain.  Gastrointestinal: Positive for abdominal pain (near umbilicus - intermittent), nausea and vomiting (x 2, non-bloody). Negative for blood in stool, diarrhea and heartburn.  Musculoskeletal: Positive for myalgias.  Neurological: Positive for headaches. Negative for dizziness.      Social History   Socioeconomic History  . Marital status: Single    Spouse name: Not on file  . Number of children: 2  . Years of education: Not on file  .  Highest education level: Associate degree: academic program  Occupational History  . Occupation: disabled  Tobacco Use  . Smoking status: Never Smoker  . Smokeless tobacco: Never Used  Vaping Use  . Vaping Use: Never used  Substance and Sexual Activity  . Alcohol use: No  . Drug use: No  . Sexual activity: Not Currently    Birth control/protection: Surgical  Other Topics Concern  . Not on file  Social History Narrative   No regular exercise      Pt is right handed, she occasionally drinks tea, walks QOD. She lives with her 63 yr old son, he has mild cerebral palsy.   Social Determinants of Health   Financial Resource Strain: Not on file  Food Insecurity: Not on file  Transportation Needs: Not on file  Physical Activity: Not on file  Stress: Not on file  Social Connections: Not on file     Observations/Objective:    Assessment and Plan:  See Problem List for Assessment and Plan of chronic medical problems.   Follow Up Instructions:    I discussed the assessment and treatment plan with the patient. The patient was provided an opportunity to ask questions and all were answered. The patient agreed with the plan and demonstrated an understanding of the instructions.   The patient was advised to call back or seek an in-person evaluation if the symptoms worsen or if the condition fails to improve as anticipated.  Time spent on telephone call: 8  minutes  Pincus Sanes, MD

## 2020-09-23 NOTE — Assessment & Plan Note (Signed)
Acute Her symptoms are consistent with a viral illness She did get tested for Covid and the results are pending Could definitely be the flu-discussed that she could get tested for this, but it would not change management at this time Symptoms are not consistent with a bacterial infection so we will not consider an antibiotic Discussed symptomatic treatment over-the-counter medications Call if no improvement or if symptoms worsen

## 2020-09-26 ENCOUNTER — Other Ambulatory Visit: Payer: Medicare PPO

## 2020-09-26 DIAGNOSIS — Z20822 Contact with and (suspected) exposure to covid-19: Secondary | ICD-10-CM

## 2020-09-27 LAB — NOVEL CORONAVIRUS, NAA: SARS-CoV-2, NAA: NOT DETECTED

## 2020-09-27 LAB — SARS-COV-2, NAA 2 DAY TAT

## 2020-09-30 ENCOUNTER — Telehealth (INDEPENDENT_AMBULATORY_CARE_PROVIDER_SITE_OTHER): Payer: Medicare PPO | Admitting: Internal Medicine

## 2020-09-30 ENCOUNTER — Encounter: Payer: Self-pay | Admitting: Internal Medicine

## 2020-09-30 DIAGNOSIS — J111 Influenza due to unidentified influenza virus with other respiratory manifestations: Secondary | ICD-10-CM

## 2020-09-30 NOTE — Assessment & Plan Note (Signed)
Acute Her son has been diagnosed with the flu and she has the same symptoms covid test was negative She likely has the flu as well Symptomatic treatment discussed continue tylenol and cough medication Continue inhalers Start flonase nasal spray

## 2020-09-30 NOTE — Progress Notes (Signed)
Virtual Visit via Video Note  I connected with Kathleen Solis on 09/30/20 at  9:45 AM EST by a video enabled telemedicine application and verified that I am speaking with the correct person using two identifiers.   I discussed the limitations of evaluation and management by telemedicine and the availability of in person appointments. The patient expressed understanding and agreed to proceed.  Present for the visit:  Myself, Dr Cheryll Cockayne, Kathleen Solis.  The patient is currently at home and I am in the office.    No referring provider.    History of Present Illness: This is an acute visit for cold symptoms.  We had a virtual visit 1 week ago and she states she is still symptomatic.  She did have a negative Covid test 4 days ago.  She has a son who has the same symptoms and she did take him to the pediatrician and he was diagnosed with the flu.  He is still symptomatic.  She states continued sweats, sore throat, dry cough, chest pain and back pain from coughing.  She states some nausea, body aches and headaches.  She was experiencing abdominal pain and diarrhea, but that has resolved.  She is taking Tylenol extra strength and the cough syrup-both of which help.    Review of Systems  Constitutional: Positive for diaphoresis.  HENT: Positive for sore throat. Negative for ear pain and sinus pain.   Respiratory: Positive for cough (dry). Negative for shortness of breath and wheezing.   Cardiovascular: Positive for chest pain.  Gastrointestinal: Positive for abdominal pain (resolved), diarrhea (resolved) and nausea.  Musculoskeletal: Positive for back pain and myalgias.  Neurological: Positive for headaches.     Social History   Socioeconomic History  . Marital status: Single    Spouse name: Not on file  . Number of children: 2  . Years of education: Not on file  . Highest education level: Associate degree: academic program  Occupational History  . Occupation: disabled   Tobacco Use  . Smoking status: Never Smoker  . Smokeless tobacco: Never Used  Vaping Use  . Vaping Use: Never used  Substance and Sexual Activity  . Alcohol use: No  . Drug use: No  . Sexual activity: Not Currently    Birth control/protection: Surgical  Other Topics Concern  . Not on file  Social History Narrative   No regular exercise      Pt is right handed, she occasionally drinks tea, walks QOD. She lives with her 54 yr old son, he has mild cerebral palsy.   Social Determinants of Health   Financial Resource Strain: Not on file  Food Insecurity: Not on file  Transportation Needs: Not on file  Physical Activity: Not on file  Stress: Not on file  Social Connections: Not on file     Observations/Objective: Appears well in NAD Breathing normally, intermittent cough  Assessment and Plan:  See Problem List for Assessment and Plan of chronic medical problems.   Follow Up Instructions:    I discussed the assessment and treatment plan with the patient. The patient was provided an opportunity to ask questions and all were answered. The patient agreed with the plan and demonstrated an understanding of the instructions.   The patient was advised to call back or seek an in-person evaluation if the symptoms worsen or if the condition fails to improve as anticipated.    Pincus Sanes, MD

## 2020-10-06 ENCOUNTER — Telehealth: Payer: Self-pay

## 2020-10-06 NOTE — Telephone Encounter (Signed)
Patient called to cancel her appointment for Thursday the 17th with Dr.Padgett. both her and her son are positive for the flu and right now she does not want to do a telephone visit either.

## 2020-10-06 NOTE — Telephone Encounter (Signed)
Ok thanks for the update 

## 2020-10-08 ENCOUNTER — Ambulatory Visit: Payer: Medicare PPO | Admitting: Allergy

## 2020-10-08 DIAGNOSIS — M25512 Pain in left shoulder: Secondary | ICD-10-CM | POA: Diagnosis not present

## 2020-10-08 DIAGNOSIS — M546 Pain in thoracic spine: Secondary | ICD-10-CM | POA: Diagnosis not present

## 2020-10-08 DIAGNOSIS — S46812A Strain of other muscles, fascia and tendons at shoulder and upper arm level, left arm, initial encounter: Secondary | ICD-10-CM | POA: Diagnosis not present

## 2020-10-21 ENCOUNTER — Telehealth (INDEPENDENT_AMBULATORY_CARE_PROVIDER_SITE_OTHER): Payer: Medicare PPO | Admitting: Internal Medicine

## 2020-10-21 ENCOUNTER — Encounter: Payer: Self-pay | Admitting: Internal Medicine

## 2020-10-21 ENCOUNTER — Other Ambulatory Visit: Payer: Self-pay

## 2020-10-21 DIAGNOSIS — J019 Acute sinusitis, unspecified: Secondary | ICD-10-CM

## 2020-10-21 DIAGNOSIS — M542 Cervicalgia: Secondary | ICD-10-CM

## 2020-10-21 DIAGNOSIS — M549 Dorsalgia, unspecified: Secondary | ICD-10-CM

## 2020-10-21 MED ORDER — AMOXICILLIN-POT CLAVULANATE 875-125 MG PO TABS: 1.0000 | ORAL_TABLET | Freq: Two times a day (BID) | ORAL | 0 refills | Status: DC
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
## 2020-10-21 NOTE — Assessment & Plan Note (Signed)
Acute Likely bacterial  Start Augmentin 875-125 mg BID x 10 day otc cold medications Rest, fluid Call if no improvement  

## 2020-10-22 ENCOUNTER — Ambulatory Visit: Payer: Medicare PPO | Admitting: Family Medicine

## 2020-10-30 ENCOUNTER — Ambulatory Visit: Payer: Medicare PPO | Admitting: Neurology

## 2020-10-30 ENCOUNTER — Ambulatory Visit: Payer: Medicare PPO | Admitting: Family Medicine
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
## 2020-11-01 NOTE — Patient Instructions (Addendum)
    Blood work was ordered.     Medications changes include :  none   Your prescription(s) have been submitted to your pharmacy. Please take as directed and contact our office if you believe you are having problem(s) with the medication(s).   Please followup in 6 months  

## 2020-11-01 NOTE — Progress Notes (Signed)
Subjective:    Patient ID: Kathleen Solis, female    DOB: 04-12-1966, 55 y.o.   MRN: [id number]  HPI The patient is here for follow up of their chronic medical problems, including htn, hyperlipidemia, hyperglycemia  Not taking omeprazole.  She denies any reflux.  She states when she was taking the medication it did not help with her cough.   She coughs all the time.  It is all day every day.  It is a dry cough.  She is unsure if it is her allergies or asthma.  She is thinking about trying the allergy injections.   Medications and allergies reviewed with patient and updated if appropriate.  Patient Active Problem List   Diagnosis Date Noted  . Mild asthma 08/27/2020  . Epigastric pain 05/06/2020  . Sinus infection 03/22/2019  . Cough 03/17/2019  . Lumbar radiculopathy 05/15/2018  . Lumbar back pain 05/10/2018  . B12 deficiency 02/23/2018  . Tingling 02/21/2018  . Onychomycosis 02/21/2018  . Left ovarian cyst 10/04/2017  . Hyperlipidemia 02/14/2017  . Chest pain 02/14/2017  . Hyperglycemia 02/14/2017  . Tinnitus of right ear 11/16/2016  . Constipation 09/13/2016  . Allergic rhinitis 08/16/2016  . Carotid arterial disease (HCC) 04/10/2016  . Diverticulitis of colon without hemorrhage 02/25/2016  . Bunion of left foot 02/08/2016  . Bone spur of right foot 02/08/2016  . Leg neuralgia, right 02/08/2016  . Episodic tension type headache 01/12/2016  . Benign essential HTN 03/03/2015  . History of cerebrovascular accident with residual deficit 03/03/2015    Current Outpatient Medications on File Prior to Visit  Medication Sig Dispense Refill  . acetaminophen (TYLENOL) 500 MG tablet Take 500 mg by mouth every 6 (six) hours as needed.    Marland Kitchen albuterol (VENTOLIN HFA) 108 (90 Base) MCG/ACT inhaler Inhale 2 puffs into the lungs every 6 (six) hours as needed for wheezing or shortness of breath. 8 g 6  . budesonide-formoterol (SYMBICORT) 160-4.5 MCG/ACT inhaler Inhale 2 puffs into  the lungs 2 (two) times daily. 1 each 5  . fluticasone (FLONASE) 50 MCG/ACT nasal spray Place 2 sprays into both nostrils daily. 16 g 6  . omeprazole (PRILOSEC) 40 MG capsule Take 1 capsule (40 mg total) by mouth in the morning and at bedtime. 60 capsule 3  . ondansetron (ZOFRAN-ODT) 4 MG disintegrating tablet Take 1-2 tabs every 4 hours as needed for nausea 20 tablet 3  . promethazine-dextromethorphan (PROMETHAZINE-DM) 6.25-15 MG/5ML syrup Take 5 mLs by mouth 4 (four) times daily as needed for cough. 473 each 3   No current facility-administered medications on file prior to visit.    Past Medical History:  Diagnosis Date  . Asthma   . Diverticulitis   . GERD (gastroesophageal reflux disease)   . Hyperlipidemia    diet controlled - no med  . Hypertension   . Sciatica    right  . Seasonal allergies   . Stroke [facility]) 2000   several mini strokes, weakness on right arm and leg  . SVD (spontaneous vaginal delivery)    x 2    Past Surgical History:  Procedure Laterality Date  . ABDOMINAL HYSTERECTOMY     partial  . ANAL FISSURE REPAIR    . APPENDECTOMY    . CHOLECYSTECTOMY    . CYSTOSCOPY N/A 10/04/2017   Procedure: CYSTOSCOPY;  Surgeon: Gerald Leitz, MD;  Location: [facility];  Service: Gynecology;  Laterality: N/A;  . FOOT SURGERY Left    big toe bunion removed  .  LAPAROSCOPIC BILATERAL SALPINGO OOPHERECTOMY Bilateral 10/04/2017   Procedure: LAPAROSCOPIC BILATERAL SALPINGO OOPHORECTOMY, PELVIC WASHINGS;  Surgeon: Gerald Leitz, MD;  Location: [facility];  Service: Gynecology;  Laterality: Bilateral;  . TUBAL LIGATION    . WISDOM TOOTH EXTRACTION      Social History   Socioeconomic History  . Marital status: Single    Spouse name: Not on file  . Number of children: 2  . Years of education: Not on file  . Highest education level: Associate degree: academic program  Occupational History  . Occupation: disabled  Tobacco Use  . Smoking status: Never Smoker  . Smokeless tobacco: Never  Used  Vaping Use  . Vaping Use: Never used  Substance and Sexual Activity  . Alcohol use: No  . Drug use: No  . Sexual activity: Not Currently    Birth control/protection: Surgical  Other Topics Concern  . Not on file  Social History Narrative   No regular exercise      Pt is right handed, she occasionally drinks tea, walks QOD. She lives with her 2 yr old son, he has mild cerebral palsy.   Social Determinants of Health   Financial Resource Strain: Not on file  Food Insecurity: Not on file  Transportation Needs: Not on file  Physical Activity: Not on file  Stress: Not on file  Social Connections: Not on file    Family History  Problem Relation Age of Onset  . Breast cancer Sister   . Diabetes Sister   . Glaucoma Sister   . Cataracts Sister   . Hypertension Sister   . Anuerysm Mother   . Pancreatic cancer Father   . Retinal detachment Brother   . Cataracts Sister   . Breast cancer Cousin   . Anuerysm Cousin   . Breast cancer Niece   . Esophageal cancer Nephew   . Colon polyps Neg Hx   . Prostate cancer Neg Hx   . Rectal cancer Neg Hx   . Stomach cancer Neg Hx   . Uterine cancer Neg Hx     Review of Systems  Constitutional: Positive for diaphoresis. Negative for fever.  Respiratory: Positive for cough and wheezing (? maybe). Negative for shortness of breath.   Cardiovascular: Negative for chest pain, palpitations and leg swelling.  Gastrointestinal: Positive for vomiting (with coughing). Negative for abdominal pain and nausea.       No gerd  Neurological: Negative for light-headedness and headaches.  Psychiatric/Behavioral: Positive for sleep disturbance. Negative for dysphoric mood. The patient is not nervous/anxious.        Objective:   Vitals:   11/03/20 0930  BP: 110/78  Pulse: (!) 58  Temp: 97.9 F (36.6 C)  SpO2: 98%   BP Readings from Last 3 Encounters:  11/03/20 110/78  11/02/20 111/77  08/27/20 118/80   Wt Readings from Last 3  Encounters:  11/03/20 196 lb (88.9 kg)  11/02/20 196 lb (88.9 kg)  08/27/20 189 lb (85.7 kg)   Body mass index is 32.62 kg/m.  Depression screen [facility] 2/9 11/03/2020 06/07/2017  Decreased Interest 0 0  Down, Depressed, Hopeless 0 0  PHQ - 2 Score 0 0  Altered sleeping 1 -  Tired, decreased energy 1 -  Change in appetite 1 -  Feeling bad or failure about yourself  0 -  Trouble concentrating 1 -  Moving slowly or fidgety/restless 0 -  Suicidal thoughts 0 -  PHQ-9 Score 4 -  Difficult doing work/chores Not difficult at all -  GAD 7 : Generalized Anxiety Score 11/03/2020  Nervous, Anxious, on Edge 0  Worry too much - different things 0  Trouble relaxing 1  Restless 0  Easily annoyed or irritable 0  Afraid - awful might happen 0  Anxiety Difficulty Not difficult at all        Physical Exam    Constitutional: Appears well-developed and well-nourished. No distress.  HENT:  Head: Normocephalic and atraumatic.  Neck: Neck supple. No tracheal deviation present. No thyromegaly present.  No cervical lymphadenopathy Cardiovascular: Normal rate, regular rhythm and normal heart sounds.   No murmur heard. No carotid bruit .  No edema Pulmonary/Chest: Effort normal and breath sounds normal. No respiratory distress. No has no wheezes. No rales.  Skin: Skin is warm and dry. Not diaphoretic.  Psychiatric: Normal mood and affect. Behavior is normal.      Assessment & Plan:    Screened for depression using the PHQ 9 scale.  Her positive answers were related to trouble falling asleep and feeling tired.  That is something she states she has had forever.  She denies depression.  No evidence of depression.   Screening for anxiety using the GAD-7 score.  No evidence of anxiety.  See Problem List for Assessment and Plan of chronic medical problems.    This visit occurred during the SARS-CoV-2 public health emergency.  Safety protocols were in place, including screening questions prior to  the visit, additional usage of staff PPE, and extensive cleaning of exam room while observing appropriate contact time as indicated for disinfecting solutions.

## 2020-11-02 ENCOUNTER — Ambulatory Visit (INDEPENDENT_AMBULATORY_CARE_PROVIDER_SITE_OTHER): Payer: Medicare PPO

## 2020-11-02 ENCOUNTER — Ambulatory Visit (INDEPENDENT_AMBULATORY_CARE_PROVIDER_SITE_OTHER): Payer: Medicare PPO | Admitting: Family Medicine

## 2020-11-02 ENCOUNTER — Other Ambulatory Visit: Payer: Self-pay

## 2020-11-02 VITALS — BP 111/77 | HR 58 | Ht 65.0 in | Wt 196.0 lb

## 2020-11-02 DIAGNOSIS — M25512 Pain in left shoulder: Secondary | ICD-10-CM

## 2020-11-02 DIAGNOSIS — M542 Cervicalgia: Secondary | ICD-10-CM

## 2020-11-02 NOTE — Patient Instructions (Addendum)
Thank you for coming in today.  Please complete the exercises that the athletic trainer went over with you: View at [email or web address]-exercise-[email or web address] using code: [email or web address]  Plan for PT.   Use heating pad and TENS unit.  Consider a massage gun.   TENS UNIT: This is helpful for muscle pain and spasm.   Search and Purchase a TENS 7000 2nd edition at  www.tenspros.com or www.Amazon.com It should be less than $30.     TENS unit instructions: Do not shower or bathe with the unit on . Turn the unit off before removing electrodes or batteries . If the electrodes lose stickiness add a drop of water to the electrodes after they are disconnected from the unit and place on plastic sheet. If you continued to have difficulty, call the TENS unit company to purchase more electrodes. . Do not apply lotion on the skin area prior to use. Make sure the skin is clean and dry as this will help prolong the life of the electrodes. . After use, always check skin for unusual red areas, rash or other skin difficulties. If there are any skin problems, does not apply electrodes to the same area. . Never remove the electrodes from the unit by pulling the wires. . Do not use the TENS unit or electrodes other than as directed. . Do not change electrode placement without consultating your therapist or physician. Marland Kitchen Keep 2 fingers with between each electrode. . Wear time ratio is 2:1, on to off times.    For example on for 30 minutes off for 15 minutes and then on for 30 minutes off for 15 minutes  Recheck in 6 weeks.

## 2020-11-03 ENCOUNTER — Other Ambulatory Visit: Payer: Self-pay

## 2020-11-03 ENCOUNTER — Encounter: Payer: Self-pay | Admitting: Internal Medicine

## 2020-11-03 ENCOUNTER — Ambulatory Visit: Payer: Medicare PPO | Admitting: Internal Medicine

## 2020-11-03 VITALS — BP 110/78 | HR 58 | Temp 97.9°F | Ht 65.0 in | Wt 196.0 lb

## 2020-11-03 DIAGNOSIS — Z1159 Encounter for screening for other viral diseases: Secondary | ICD-10-CM | POA: Diagnosis not present

## 2020-11-03 DIAGNOSIS — E7849 Other hyperlipidemia: Secondary | ICD-10-CM

## 2020-11-03 DIAGNOSIS — Z114 Encounter for screening for human immunodeficiency virus [HIV]: Secondary | ICD-10-CM

## 2020-11-03 DIAGNOSIS — R739 Hyperglycemia, unspecified: Secondary | ICD-10-CM

## 2020-11-03 DIAGNOSIS — I10 Essential (primary) hypertension: Secondary | ICD-10-CM | POA: Diagnosis not present

## 2020-11-03 DIAGNOSIS — E7800 Pure hypercholesterolemia, unspecified: Secondary | ICD-10-CM

## 2020-11-03 LAB — LIPID PANEL
Cholesterol: 133 mg/dL (ref 0–200)
HDL: 57.3 mg/dL (ref >39.00)
LDL Cholesterol: 60 mg/dL (ref 0–99)
NonHDL: 75.97
Total CHOL/HDL Ratio: 2
Triglycerides: 82 mg/dL (ref 0.0–149.0)
VLDL: 16.4 mg/dL (ref 0.0–40.0)

## 2020-11-03 LAB — COMPREHENSIVE METABOLIC PANEL
ALT: 15 U/L (ref 0–35)
AST: 19 U/L (ref 0–37)
Albumin: 4.2 g/dL (ref 3.5–5.2)
Alkaline Phosphatase: 90 U/L (ref 39–117)
BUN: 18 mg/dL (ref 6–23)
CO2: 35 mEq/L — ABNORMAL HIGH (ref 19–32)
Calcium: 9.9 mg/dL (ref 8.4–10.5)
Chloride: 97 mEq/L (ref 96–112)
Creatinine, Ser: 0.81 mg/dL (ref 0.40–1.20)
GFR: 82.6 mL/min (ref >60.00)
Glucose, Bld: 85 mg/dL (ref 70–99)
Potassium: 3.3 mEq/L — ABNORMAL LOW (ref 3.5–5.1)
Sodium: 139 mEq/L (ref 135–145)
Total Bilirubin: 1 mg/dL (ref 0.2–1.2)
Total Protein: 7.9 g/dL (ref 6.0–8.3)

## 2020-11-03 LAB — HEMOGLOBIN A1C: Hgb A1c MFr Bld: 5.1 % (ref 4.6–6.5)

## 2020-11-03 LAB — TSH: TSH: 0.88 u[IU]/mL (ref 0.35–4.50)

## 2020-11-03 MED ORDER — HYDROCHLOROTHIAZIDE 25 MG PO TABS: 25.0000 mg | ORAL_TABLET | Freq: Every day | ORAL | 1 refills | Status: DC

## 2020-11-03 MED ORDER — METOPROLOL SUCCINATE ER 100 MG PO TB24: 100.0000 mg | ORAL_TABLET | Freq: Every day | ORAL | 1 refills | Status: DC

## 2020-11-03 MED ORDER — ATORVASTATIN CALCIUM 10 MG PO TABS: 10.0000 mg | ORAL_TABLET | Freq: Every day | ORAL | 1 refills | Status: DC

## 2020-11-03 NOTE — Assessment & Plan Note (Signed)
Chronic BP well controlled Continue hydrochlorothiazide 25 mg daily, metoprolol XL 100 mg daily cmp

## 2020-11-03 NOTE — Assessment & Plan Note (Addendum)
Chronic Check lipid panel, CMP, TSH Continue atorvastatin 10 mg daily Regular exercise and healthy diet encouraged  

## 2020-11-03 NOTE — Assessment & Plan Note (Signed)
Chronic Check A1c 

## 2020-11-03 NOTE — Progress Notes (Signed)
X-ray cervical spine shows some arthritis changes

## 2020-11-03 NOTE — Progress Notes (Signed)
X-ray left shoulder shows mild arthritis

## 2020-11-04 LAB — HIV ANTIBODY (ROUTINE TESTING W REFLEX): HIV 1&2 Ab, 4th Generation: NONREACTIVE

## 2020-11-04 LAB — HEPATITIS C ANTIBODY
Hepatitis C Ab: NONREACTIVE
SIGNAL TO CUT-OFF: 0.02 (ref <1.00)

## 2020-11-05 MED ORDER — POTASSIUM CHLORIDE ER 10 MEQ PO TBCR: 10.0000 meq | EXTENDED_RELEASE_TABLET | Freq: Two times a day (BID) | ORAL | 11 refills | Status: DC
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
## 2020-11-05 NOTE — Addendum Note (Signed)
Addended by: Pincus Sanes on: 11/05/2020 09:28 PM   Modules accepted: Orders

## 2020-11-06 ENCOUNTER — Encounter: Payer: Self-pay | Admitting: Neurology

## 2020-11-06 ENCOUNTER — Telehealth: Payer: Self-pay

## 2020-11-06 ENCOUNTER — Other Ambulatory Visit: Payer: Self-pay

## 2020-11-06 ENCOUNTER — Other Ambulatory Visit (INDEPENDENT_AMBULATORY_CARE_PROVIDER_SITE_OTHER): Payer: Medicare PPO

## 2020-11-06 ENCOUNTER — Ambulatory Visit: Payer: Medicare PPO | Admitting: Neurology

## 2020-11-06 VITALS — BP 119/77 | HR 69 | Ht 65.5 in | Wt 197.0 lb

## 2020-11-06 DIAGNOSIS — G3184 Mild cognitive impairment, so stated: Secondary | ICD-10-CM

## 2020-11-06 DIAGNOSIS — G44219 Episodic tension-type headache, not intractable: Secondary | ICD-10-CM

## 2020-11-06 DIAGNOSIS — I10 Essential (primary) hypertension: Secondary | ICD-10-CM | POA: Diagnosis not present

## 2020-11-06 DIAGNOSIS — Z8669 Personal history of other diseases of the nervous system and sense organs: Secondary | ICD-10-CM | POA: Diagnosis not present

## 2020-11-06 DIAGNOSIS — E785 Hyperlipidemia, unspecified: Secondary | ICD-10-CM | POA: Diagnosis not present

## 2020-11-06 DIAGNOSIS — E538 Deficiency of other specified B group vitamins: Secondary | ICD-10-CM

## 2020-11-06 DIAGNOSIS — I69349 Monoplegia of lower limb following cerebral infarction affecting unspecified side: Secondary | ICD-10-CM | POA: Diagnosis not present

## 2020-11-06 LAB — VITAMIN B12: Vitamin B-12: 171 pg/mL — ABNORMAL LOW (ref 211–911)

## 2020-11-06 NOTE — Telephone Encounter (Signed)
-----   Message from Drema Dallas, DO sent at 11/06/2020  1:43 PM EDT ----- B12 level is low.  This may contribute to memory problems.  Recommend starting over the counter B12 daily.  Repeat level in 3 months.

## 2020-11-06 NOTE — Telephone Encounter (Signed)
Pt called and informed that B12 level is low. This may contribute to memory problems. Recommend starting over the counter B12 daily. Repeat level in 3 months

## 2020-11-06 NOTE — Patient Instructions (Addendum)
1.  Restart aspirin 81mg  daily 2.  Check MRI of brain without contrast. We have sent a referral to [facility] for your MRI and they will call you directly to schedule your appointment. They are located at [address] [facility] If you need to contact them directly please call [phone].  3.  Check carotid ultrasound 4.  Check B12 5.  Refer to neuropsychological testing 6.  Refer to sleep medicine for evaluation of sleep apnea 7.  Follow up after neuropsychological testing.

## 2020-11-13 ENCOUNTER — Other Ambulatory Visit: Payer: Self-pay

## 2020-11-13 ENCOUNTER — Ambulatory Visit (HOSPITAL_COMMUNITY)
Admission: RE | Admit: 2020-11-13 | Discharge: 2020-11-13 | Disposition: A | Discharge status: Home / Self Care | Payer: Medicare PPO | Source: Ambulatory Visit | Attending: Cardiology | Admitting: Cardiology

## 2020-11-13 DIAGNOSIS — I69341 Monoplegia of lower limb following cerebral infarction affecting right dominant side: Secondary | ICD-10-CM | POA: Diagnosis not present

## 2020-11-13 DIAGNOSIS — I69349 Monoplegia of lower limb following cerebral infarction affecting unspecified side: Secondary | ICD-10-CM | POA: Insufficient documentation

## 2020-11-17 ENCOUNTER — Other Ambulatory Visit: Payer: Self-pay

## 2020-11-17 ENCOUNTER — Ambulatory Visit (INDEPENDENT_AMBULATORY_CARE_PROVIDER_SITE_OTHER): Payer: Self-pay | Admitting: Physical Therapy

## 2020-11-17 DIAGNOSIS — M6281 Muscle weakness (generalized): Secondary | ICD-10-CM

## 2020-11-17 DIAGNOSIS — M545 Low back pain, unspecified: Secondary | ICD-10-CM

## 2020-11-17 DIAGNOSIS — M79605 Pain in left leg: Secondary | ICD-10-CM

## 2020-11-17 DIAGNOSIS — M542 Cervicalgia: Secondary | ICD-10-CM

## 2020-11-17 DIAGNOSIS — M25512 Pain in left shoulder: Secondary | ICD-10-CM
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
## 2020-11-17 NOTE — Patient Instructions (Signed)
Access Code: [email or web address] URL: [email or web address] Date: 11/17/2020 Prepared by: Narda Amber  Exercises Seated Assisted Cervical Rotation with Towel - 3 x daily - 7 x weekly - 5 reps - 10 seconds hold Seated Upper Trap Stretch - 3 x daily - 7 x weekly - 5 reps - 10 seconds hold Supine Shoulder Flexion with Dowel - 2-3 x daily - 7 x weekly - 2 sets - 10 reps Supine Shoulder External Rotation in 45 Degrees Abduction AAROM with Dowel - 2-3 x daily - 7 x weekly - 2 sets - 10 reps - 5 seconds hold

## 2020-11-24 ENCOUNTER — Ambulatory Visit (INDEPENDENT_AMBULATORY_CARE_PROVIDER_SITE_OTHER): Payer: Medicare PPO | Admitting: Internal Medicine

## 2020-11-24 ENCOUNTER — Encounter: Payer: Self-pay | Admitting: Internal Medicine

## 2020-11-24 ENCOUNTER — Other Ambulatory Visit: Payer: Self-pay

## 2020-11-24 VITALS — BP 120/78 | HR 63 | Ht 65.5 in | Wt 200.8 lb

## 2020-11-24 DIAGNOSIS — I69349 Monoplegia of lower limb following cerebral infarction affecting unspecified side: Secondary | ICD-10-CM

## 2020-11-24 DIAGNOSIS — I10 Essential (primary) hypertension: Secondary | ICD-10-CM

## 2020-11-24 DIAGNOSIS — R0609 Other forms of dyspnea: Secondary | ICD-10-CM

## 2020-11-24 DIAGNOSIS — I6523 Occlusion and stenosis of bilateral carotid arteries: Secondary | ICD-10-CM | POA: Diagnosis not present

## 2020-11-24 DIAGNOSIS — R072 Precordial pain: Secondary | ICD-10-CM

## 2020-11-24 DIAGNOSIS — E782 Mixed hyperlipidemia: Secondary | ICD-10-CM | POA: Diagnosis not present

## 2020-11-24 DIAGNOSIS — R0600 Dyspnea, unspecified: Secondary | ICD-10-CM | POA: Diagnosis not present

## 2020-11-24 MED ORDER — ASPIRIN EC 81 MG PO TBEC: 81.0000 mg | DELAYED_RELEASE_TABLET | Freq: Every day | ORAL | 0 refills | Status: DC

## 2020-11-24 NOTE — Progress Notes (Signed)
Cardiology Office Note:    Date:  11/24/2020   ID:  Kathleen Solis, DOB July 09, 1966, MRN [id number]  PCP:  Pincus Sanes, MD  Cardiologist:  No primary care provider on file.  Electrophysiologist:  None   Referring MD: Pincus Sanes, MD /Dr. Everlena Cooper [facility]  Chief Complaint/Reason for Referral: HTN, chest pain  History of Present Illness:    Kathleen Solis is a 55 y.o. female with a history of hypertension, hyperlipidemia, carotid artery disease, tension type headache and history of stroke who presents for further evaluation of HTN.   This weekend, chest started hurting and threw up, lost control of bladder. Substernal chest pressure, having chest pain right now. When threw up chest pain relieved. On Sunday, and yesterday morning. Has had chest pain before as well, goes down right arm, different sensation than your asthma. Different than reflux too, that is a burning sensation in your throat. With walking gets pressure on chest. Tired too. Mild DOE with walking. Recovery in 5-10 min.   CT abd 9/19 reviewed, no definite CAC in field of view.   Fhx:  Older sister "bad heart" - blockages,  Brother - also blockages.  Mom - HTN, died at 38 from brain aneurysm.  MAunt - heart and diabetes and HTN . PAunt - pacemaker.  Past Medical History:  Diagnosis Date  . Asthma   . Diverticulitis   . GERD (gastroesophageal reflux disease)   . Hyperlipidemia    diet controlled - no med  . Hypertension   . Sciatica    right  . Seasonal allergies   . Stroke [facility]) 2000   several mini strokes, weakness on right arm and leg  . SVD (spontaneous vaginal delivery)    x 2    Past Surgical History:  Procedure Laterality Date  . ABDOMINAL HYSTERECTOMY     partial  . ANAL FISSURE REPAIR    . APPENDECTOMY    . CHOLECYSTECTOMY    . CYSTOSCOPY N/A 10/04/2017   Procedure: CYSTOSCOPY;  Surgeon: Gerald Leitz, MD;  Location: [facility];  Service: Gynecology;  Laterality: N/A;  . FOOT SURGERY Left    big  toe bunion removed  . LAPAROSCOPIC BILATERAL SALPINGO OOPHERECTOMY Bilateral 10/04/2017   Procedure: LAPAROSCOPIC BILATERAL SALPINGO OOPHORECTOMY, PELVIC WASHINGS;  Surgeon: Gerald Leitz, MD;  Location: [facility];  Service: Gynecology;  Laterality: Bilateral;  . TUBAL LIGATION    . WISDOM TOOTH EXTRACTION      Current Medications: Current Meds  Medication Sig  . acetaminophen (TYLENOL) 500 MG tablet Take 500 mg by mouth every 6 (six) hours as needed. Patients states she usually takes 2-4 tabs daily  . atorvastatin (LIPITOR) 10 MG tablet Take 1 tablet (10 mg total) by mouth at bedtime.  . budesonide-formoterol (SYMBICORT) 160-4.5 MCG/ACT inhaler Inhale 2 puffs into the lungs 2 (two) times daily.  . fluticasone (FLONASE) 50 MCG/ACT nasal spray Place 2 sprays into both nostrils daily.  . hydrochlorothiazide (HYDRODIURIL) 25 MG tablet Take 1 tablet (25 mg total) by mouth daily.  . metoprolol succinate (TOPROL-XL) 100 MG 24 hr tablet Take 1 tablet (100 mg total) by mouth at bedtime.  . Misc Natural Products (ELDERBERRY ZINC/VIT C/IMMUNE MT) Take 2 each by mouth daily.  . potassium chloride (KLOR-CON 10) 10 MEQ tablet Take 1 tablet (10 mEq total) by mouth 2 (two) times daily.  . promethazine-dextromethorphan (PROMETHAZINE-DM) 6.25-15 MG/5ML syrup Take 5 mLs by mouth 4 (four) times daily as needed for cough.  . vitamin B-12 (CYANOCOBALAMIN) 1000  MCG tablet Take 1,000 mcg by mouth daily.     Allergies:   Benzonatate, Darvon [propoxyphene], Ibuprofen, Morphine and related, Prednisone, Sulfa antibiotics, Tussionex pennkinetic er [hydrocod polst-cpm polst er], Buprenorphine hcl, Ivp dye [iodinated diagnostic agents], and Prochlorperazine edisylate   Social History   Tobacco Use  . Smoking status: Never Smoker  . Smokeless tobacco: Never Used  Vaping Use  . Vaping Use: Never used  Substance Use Topics  . Alcohol use: No  . Drug use: No     Family History: The patient's family history includes  Anuerysm in her cousin and mother; Breast cancer in her cousin, niece, and sister; Cataracts in her sister and sister; Diabetes in her sister; Esophageal cancer in her nephew; Glaucoma in her sister; Hypertension in her sister; Pancreatic cancer in her father; Retinal detachment in her brother. There is no history of Colon polyps, Prostate cancer, Rectal cancer, Stomach cancer, or Uterine cancer.  ROS:   Please see the history of present illness.    All other systems reviewed and are negative.  EKGs/Labs/Other Studies Reviewed:    The following studies were reviewed today:  EKG: Sinus rhythm, nonspecific T wave abnormality  I have independently reviewed the images from CT abdomen pelvis without contrast.  May 10, 2020, and 11/02/2019.  No coronary artery calcifications noted.  Recent Labs: 05/10/2020: Hemoglobin 13.8; Platelets 261 11/03/2020: ALT 15; BUN 18; Creatinine, Ser 0.81; Potassium 3.3; Sodium 139; TSH 0.88  Recent Lipid Panel    Component Value Date/Time   CHOL 133 11/03/2020 1009   CHOL 200 (H) 12/26/2017 1421   TRIG 82.0 11/03/2020 1009   HDL 57.30 11/03/2020 1009   HDL 53 12/26/2017 1421   CHOLHDL 2 11/03/2020 1009   VLDL 16.4 11/03/2020 1009   LDLCALC 60 11/03/2020 1009   LDLCALC 122 (H) 12/26/2017 1421    Physical Exam:    VS:  BP 120/78   Pulse 63   Ht 5' 5.5" (1.664 m)   Wt 200 lb 12.8 oz (91.1 kg)   BMI 32.91 kg/m     Wt Readings from Last 5 Encounters:  11/24/20 200 lb 12.8 oz (91.1 kg)  11/06/20 197 lb (89.4 kg)  11/03/20 196 lb (88.9 kg)  11/02/20 196 lb (88.9 kg)  08/27/20 189 lb (85.7 kg)    Constitutional: No acute distress Eyes: sclera non-icteric, normal conjunctiva and lids ENMT: normal dentition, moist mucous membranes Cardiovascular: regular rhythm, normal rate, no murmurs. S1 and S2 normal. Radial pulses normal bilaterally. No jugular venous distention.  Respiratory: clear to auscultation bilaterally GI : normal bowel sounds, soft  and nontender. No distention.   MSK: extremities warm, well perfused. No edema.  NEURO: grossly nonfocal exam, moves all extremities. PSYCH: alert and oriented x 3, normal mood and affect.   ASSESSMENT:    1. Precordial pain   2. Primary hypertension   3. 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 Mixed hyperlipidemia   5. Bilateral carotid artery stenosis   6. Dyspnea on exertion    PLAN:    Precordial pain -We will obtain a coronary CTA per patient preference.  Discussed treadmill nuclear stress test, she would prefer coronary CTA.  Discussed timing of scheduling and she is in agreement.   -We will also obtain an echocardiogram to evaluate structure and function, with a history of stroke and hypertension, will need to exclude cardiovascular etiologies.  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  Neurology recommended restarting a baby aspirin, would agree with this.  Mixed hyperlipidemia - Plan: EKG 12-Lead Lipids are well controlled on atorvastatin 10 mg daily.   Total time of encounter: 60 minutes total time of encounter, including 30 minutes spent in face-to-face patient care on the date of this encounter. This time includes coordination of care and counseling regarding above mentioned problem list. Remainder of non-face-to-face time involved reviewing chart documents/testing relevant to the patient encounter and documentation in the medical record. I have independently reviewed documentation from referring provider.   Weston Brass, MD, [facility]  [facility]    Medication Adjustments/Labs and Tests Ordered: Current medicines are reviewed at length with the patient today.  Concerns regarding medicines are outlined  above.   Orders Placed This Encounter  Procedures  . CT CORONARY MORPH W/CTA COR W/SCORE W/CA W/CM &/OR WO/CM  . CT CORONARY FRACTIONAL FLOW RESERVE DATA PREP  . CT CORONARY FRACTIONAL FLOW RESERVE FLUID ANALYSIS  . Basic metabolic panel  . EKG 12-Lead  . ECHOCARDIOGRAM COMPLETE    No orders of the defined types were placed in this encounter.   Patient Instructions  Medication Instructions:  START: ASPIRIN 81MG  DAILY  *If you need a refill on your cardiac medications before your next appointment, please call your pharmacy*  Lab Work: BMET- ONE WEEK PRIOR TO CCTA SCAN  If you have labs (blood work) drawn today and your tests are completely normal, you will receive your results only by: MyChart Message (if you have MyChart) OR . A paper copy in the mail If you have any lab test that is abnormal or we need to change your treatment, we will call you to review the results.  Testing/Procedures: Your physician has requested that you have an echocardiogram. Echocardiography is a painless test that uses sound waves to create images of your heart. It provides your doctor with information about the size and shape of your heart and how well your heart's chambers and valves are working. You may receive an ultrasound enhancing agent through an IV if needed to better visualize your heart during the echo.This procedure takes approximately one hour. There are no restrictions for this procedure. This will take place at the [address].  Your physician has requested that you have cardiac CT. Cardiac computed tomography (CT) is a painless test that uses an x-ray machine to take clear, detailed pictures of your heart. For further information please visit [address]. Please follow instruction sheet as given.  Follow-Up: At [facility], you and your health needs are our priority.  As part of our continuing mission to provide you with exceptional heart care, we have created  designated Provider Care Teams.  These Care Teams include your primary Cardiologist (physician) and Advanced Practice Providers (APPs -  Physician Assistants and Nurse Practitioners) who all work together to provide you with the care you need, when you need it.  Your next appointment:   6 WEEKS  The format for your next appointment:   In Person  Provider:   [facility], MD  Other Instructions  Your cardiac CT will be scheduled at one of the below locations:   [facility] [address] [place], [place] [phone]  If scheduled at [facility], please arrive at the [facility] main  entrance (entrance A) of [facility] 30 minutes prior to test start time. Proceed to the [facility] Radiology Department (first floor) to check-in and test prep.  Please follow these instructions carefully (unless otherwise directed):  On the Night Before the Test: . Be sure to Drink plenty of water. . Do not consume any caffeinated/decaffeinated beverages or chocolate 12 hours prior to your test. . Do not take any antihistamines 12 hours prior to your test.  On the Day of the Test: . Drink plenty of water until 1 hour prior to the test. . Do not eat any food 4 hours prior to the test. . You may take your regular medications prior to the test.  . HOLD Hydrochlorothiazide morning of the test. . FEMALES- please wear underwire-free bra if available  After the Test: . Drink plenty of water. . After receiving IV contrast, you may experience a mild flushed feeling. This is normal. . On occasion, you may experience a mild rash up to 24 hours after the test. This is not dangerous. If this occurs, you can take Benadryl 25 mg and increase your fluid intake. . If you experience trouble breathing, this can be serious. If it is severe call 911 IMMEDIATELY. If it is mild, please call our office. . If you take any of these medications: Glipizide/Metformin, Avandament,  Glucavance, please do not take 48 hours after completing test unless otherwise instructed. .  Once we have confirmed authorization from your insurance company, we will call you to set up a date and time for your test. Based on how quickly your insurance processes prior authorizations requests, please allow up to 4 weeks to be contacted for scheduling your Cardiac CT appointment. Be advised that routine Cardiac CT appointments could be scheduled as many as 8 weeks after your provider has ordered it.  For non-scheduling related questions, please contact the cardiac imaging nurse navigator should you have any questions/concerns: Rockwell AlexandriaSara Wallace, Cardiac Imaging Nurse Navigator Larey BrickMerle Prescott, Cardiac Imaging Nurse Navigator [place] [facility] Direct Office Dial: [phone]   For scheduling needs, including cancellations and rescheduling, please call [place], [phone].

## 2020-11-24 NOTE — Patient Instructions (Addendum)
Medication Instructions:  START: ASPIRIN 81MG  DAILY  *If you need a refill on your cardiac medications before your next appointment, please call your pharmacy*  Lab Work: BMET- ONE WEEK PRIOR TO CCTA SCAN  If you have labs (blood work) drawn today and your tests are completely normal, you will receive your results only by: MyChart Message (if you have MyChart) OR . A paper copy in the mail If you have any lab test that is abnormal or we need to change your treatment, we will call you to review the results.  Testing/Procedures: Your physician has requested that you have an echocardiogram. Echocardiography is a painless test that uses sound waves to create images of your heart. It provides your doctor with information about the size and shape of your heart and how well your heart's chambers and valves are working. You may receive an ultrasound enhancing agent through an IV if needed to better visualize your heart during the echo.This procedure takes approximately one hour. There are no restrictions for this procedure. This will take place at the [address], [address].  Your physician has requested that you have cardiac CT. Cardiac computed tomography (CT) is a painless test that uses an x-ray machine to take clear, detailed pictures of your heart. For further information please visit [address]. Please follow instruction sheet as given.  Follow-Up: At [facility], you and your health needs are our priority.  As part of our continuing mission to provide you with exceptional heart care, we have created designated Provider Care Teams.  These Care Teams include your primary Cardiologist (physician) and Advanced Practice Providers (APPs -  Physician Assistants and Nurse Practitioners) who all work together to provide you with the care you need, when you need it.  Your next appointment:   6 WEEKS  The format for your next appointment:   In Person  Provider:   [facility],  MD  Other Instructions  Your cardiac CT will be scheduled at one of the below locations:   [facility] [address] [place], [place] [phone]  If scheduled at [facility], please arrive at the [facility] main entrance (entrance A) of [facility] 30 minutes prior to test start time. Proceed to the [facility] Radiology Department (first floor) to check-in and test prep.  Please follow these instructions carefully (unless otherwise directed):  On the Night Before the Test: . Be sure to Drink plenty of water. . Do not consume any caffeinated/decaffeinated beverages or chocolate 12 hours prior to your test. . Do not take any antihistamines 12 hours prior to your test.  On the Day of the Test: . Drink plenty of water until 1 hour prior to the test. . Do not eat any food 4 hours prior to the test. . You may take your regular medications prior to the test.  . HOLD Hydrochlorothiazide morning of the test. . FEMALES- please wear underwire-free bra if available  After the Test: . Drink plenty of water. . After receiving IV contrast, you may experience a mild flushed feeling. This is normal. . On occasion, you may experience a mild rash up to 24 hours after the test. This is not dangerous. If this occurs, you can take Benadryl 25 mg and increase your fluid intake. . If you experience trouble breathing, this can be serious. If it is severe call 911 IMMEDIATELY. If it is mild, please call our office. . If you take any  of these medications: Glipizide/Metformin, Avandament, Glucavance, please do not take 48 hours after completing test unless otherwise instructed. .  Once we have confirmed authorization from your insurance company, we will call you to set up a date and time for your test. Based on how quickly your insurance processes prior authorizations requests, please allow up to 4 weeks to be contacted for scheduling your Cardiac CT appointment. Be  advised that routine Cardiac CT appointments could be scheduled as many as 8 weeks after your provider has ordered it.  For non-scheduling related questions, please contact the cardiac imaging nurse navigator should you have any questions/concerns: Rockwell Alexandria, Cardiac Imaging Nurse Navigator Larey Brick, Cardiac Imaging Nurse Navigator [place] [facility] Direct Office Dial: [phone]   For scheduling needs, including cancellations and rescheduling, please call [place], [phone].

## 2020-11-26 ENCOUNTER — Ambulatory Visit (INDEPENDENT_AMBULATORY_CARE_PROVIDER_SITE_OTHER): Payer: Medicare PPO | Admitting: Physical Therapy

## 2020-11-26 ENCOUNTER — Other Ambulatory Visit: Payer: Self-pay

## 2020-11-26 DIAGNOSIS — M25512 Pain in left shoulder: Secondary | ICD-10-CM | POA: Diagnosis not present

## 2020-11-26 DIAGNOSIS — M6281 Muscle weakness (generalized): Secondary | ICD-10-CM | POA: Diagnosis not present

## 2020-11-26 DIAGNOSIS — M542 Cervicalgia: Secondary | ICD-10-CM | POA: Diagnosis not present

## 2020-11-26 DIAGNOSIS — M545 Low back pain, unspecified: Secondary | ICD-10-CM

## 2020-11-26 DIAGNOSIS — M79605 Pain in left leg: Secondary | ICD-10-CM | POA: Diagnosis not present
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
## 2020-11-28 ENCOUNTER — Ambulatory Visit
Admission: RE | Admit: 2020-11-28 | Discharge: 2020-11-28 | Disposition: A | Discharge status: Home / Self Care | Payer: Medicare PPO | Source: Ambulatory Visit | Attending: Neurology | Admitting: Neurology

## 2020-11-28 ENCOUNTER — Other Ambulatory Visit: Payer: Self-pay

## 2020-11-28 DIAGNOSIS — G3184 Mild cognitive impairment, so stated: Secondary | ICD-10-CM

## 2020-11-28 DIAGNOSIS — G44219 Episodic tension-type headache, not intractable: Secondary | ICD-10-CM

## 2020-11-28 DIAGNOSIS — R413 Other amnesia: Secondary | ICD-10-CM | POA: Diagnosis not present

## 2020-11-30 NOTE — Progress Notes (Signed)
Pt advised of her mri results

## 2020-12-01 ENCOUNTER — Encounter: Payer: Self-pay | Admitting: Physical Therapy

## 2020-12-01 ENCOUNTER — Other Ambulatory Visit: Payer: Self-pay

## 2020-12-01 ENCOUNTER — Ambulatory Visit (INDEPENDENT_AMBULATORY_CARE_PROVIDER_SITE_OTHER): Payer: Medicare PPO | Admitting: Physical Therapy

## 2020-12-01 DIAGNOSIS — M542 Cervicalgia: Secondary | ICD-10-CM

## 2020-12-01 DIAGNOSIS — M6281 Muscle weakness (generalized): Secondary | ICD-10-CM | POA: Diagnosis not present

## 2020-12-01 DIAGNOSIS — M25512 Pain in left shoulder: Secondary | ICD-10-CM

## 2020-12-01 DIAGNOSIS — M79605 Pain in left leg: Secondary | ICD-10-CM

## 2020-12-01 DIAGNOSIS — M545 Low back pain, unspecified: Secondary | ICD-10-CM | POA: Diagnosis not present
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
## 2020-12-03 ENCOUNTER — Ambulatory Visit (INDEPENDENT_AMBULATORY_CARE_PROVIDER_SITE_OTHER): Payer: Medicare PPO | Admitting: Physical Therapy

## 2020-12-03 ENCOUNTER — Encounter: Payer: Self-pay | Admitting: Physical Therapy

## 2020-12-03 ENCOUNTER — Other Ambulatory Visit: Payer: Self-pay

## 2020-12-03 DIAGNOSIS — M542 Cervicalgia: Secondary | ICD-10-CM

## 2020-12-03 DIAGNOSIS — M6281 Muscle weakness (generalized): Secondary | ICD-10-CM | POA: Diagnosis not present

## 2020-12-03 DIAGNOSIS — M25512 Pain in left shoulder: Secondary | ICD-10-CM | POA: Diagnosis not present

## 2020-12-03 DIAGNOSIS — M545 Low back pain, unspecified: Secondary | ICD-10-CM | POA: Diagnosis not present

## 2020-12-03 DIAGNOSIS — M79605 Pain in left leg: Secondary | ICD-10-CM | POA: Diagnosis not present
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
## 2020-12-07 ENCOUNTER — Telehealth (HOSPITAL_COMMUNITY): Payer: Self-pay | Admitting: Emergency Medicine

## 2020-12-07 ENCOUNTER — Other Ambulatory Visit (HOSPITAL_COMMUNITY): Payer: Self-pay | Admitting: Emergency Medicine

## 2020-12-07 MED ORDER — DIPHENHYDRAMINE HCL 50 MG PO TABS: 50.0000 mg | ORAL_TABLET | Freq: Once | ORAL | 0 refills | Status: DC

## 2020-12-07 MED ORDER — PREDNISONE 50 MG PO TABS: ORAL_TABLET | ORAL | 0 refills | Status: DC

## 2020-12-07 NOTE — Progress Notes (Signed)
13 hr prep ordered for contrast allergy for upcoming CCTA appt.  Attempted to call patient but LMTCB  Rockwell Alexandria RN Navigator [facility] [phone] Office  [phone] Cell

## 2020-12-07 NOTE — Telephone Encounter (Signed)
Calling to discuss patients upcoming CCTA on 4/20. Pt has allergy to contrast listed in her chart but with reaction as n/v. Althought this isnt a true "allergy" but rather a adverse reaction/side effect, pt is still expected to premedicate with prednisone and benadryl per [facility] radiology protocol.   Pt states prednisone gives her a headache and doesn't want to proceed with the test.   Jacques Navy (ordering MD) made aware.   Cancelling appt  Rockwell Alexandria RN Navigator Cardiac Imaging [facility] [phone] Office  [phone] Cell

## 2020-12-08 ENCOUNTER — Other Ambulatory Visit: Payer: Self-pay

## 2020-12-08 ENCOUNTER — Encounter: Payer: Self-pay | Admitting: Physical Therapy

## 2020-12-08 ENCOUNTER — Ambulatory Visit (INDEPENDENT_AMBULATORY_CARE_PROVIDER_SITE_OTHER): Payer: Medicare PPO | Admitting: Physical Therapy

## 2020-12-08 DIAGNOSIS — M6281 Muscle weakness (generalized): Secondary | ICD-10-CM | POA: Diagnosis not present

## 2020-12-08 DIAGNOSIS — M542 Cervicalgia: Secondary | ICD-10-CM | POA: Diagnosis not present

## 2020-12-08 DIAGNOSIS — M25512 Pain in left shoulder: Secondary | ICD-10-CM
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
## 2020-12-09 ENCOUNTER — Ambulatory Visit (HOSPITAL_COMMUNITY): Payer: Medicare PPO

## 2020-12-10 ENCOUNTER — Other Ambulatory Visit: Payer: Self-pay

## 2020-12-10 ENCOUNTER — Ambulatory Visit (INDEPENDENT_AMBULATORY_CARE_PROVIDER_SITE_OTHER): Payer: Medicare PPO | Admitting: Physical Therapy

## 2020-12-10 DIAGNOSIS — M25512 Pain in left shoulder: Secondary | ICD-10-CM | POA: Diagnosis not present

## 2020-12-10 DIAGNOSIS — M6281 Muscle weakness (generalized): Secondary | ICD-10-CM | POA: Diagnosis not present

## 2020-12-10 DIAGNOSIS — M542 Cervicalgia: Secondary | ICD-10-CM
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
## 2020-12-14 ENCOUNTER — Other Ambulatory Visit: Payer: Self-pay

## 2020-12-14 ENCOUNTER — Encounter: Payer: Self-pay | Admitting: Family Medicine

## 2020-12-14 ENCOUNTER — Ambulatory Visit: Payer: Self-pay

## 2020-12-14 ENCOUNTER — Ambulatory Visit (INDEPENDENT_AMBULATORY_CARE_PROVIDER_SITE_OTHER): Payer: Medicare PPO | Admitting: Family Medicine

## 2020-12-14 VITALS — BP 114/78 | HR 65 | Ht 65.5 in | Wt 201.2 lb

## 2020-12-14 DIAGNOSIS — M25512 Pain in left shoulder: Secondary | ICD-10-CM

## 2020-12-14 DIAGNOSIS — G8929 Other chronic pain: Secondary | ICD-10-CM | POA: Diagnosis not present

## 2020-12-14 NOTE — Patient Instructions (Addendum)
Good to see you today.  Plan for MRI.   Recheck after the MRI.   Keep me updated.

## 2020-12-15 ENCOUNTER — Encounter: Payer: Medicare PPO | Admitting: Physical Therapy

## 2020-12-17 ENCOUNTER — Encounter: Payer: Medicare PPO | Admitting: Physical Therapy

## 2020-12-18 ENCOUNTER — Telehealth: Payer: Self-pay

## 2020-12-18 NOTE — Telephone Encounter (Signed)
Spoke with patient about moving patient in office for appointment as availability has become open.   Appointment switched from Virtual to in office on May 16th at 8:20am.  Patient also states that she does not wish to do CCTA scan. Patient states that she would rather do stress test instead.    Advised patient that I would forward message to Dr. Jacques Navy. Patient verbalized understanding.

## 2020-12-20 ENCOUNTER — Other Ambulatory Visit: Payer: Self-pay

## 2020-12-20 ENCOUNTER — Ambulatory Visit (INDEPENDENT_AMBULATORY_CARE_PROVIDER_SITE_OTHER): Payer: Medicare PPO

## 2020-12-20 DIAGNOSIS — M25512 Pain in left shoulder: Secondary | ICD-10-CM

## 2020-12-20 DIAGNOSIS — M19012 Primary osteoarthritis, left shoulder: Secondary | ICD-10-CM | POA: Diagnosis not present

## 2020-12-20 DIAGNOSIS — G8929 Other chronic pain: Secondary | ICD-10-CM | POA: Diagnosis not present

## 2020-12-20 DIAGNOSIS — M542 Cervicalgia: Secondary | ICD-10-CM | POA: Diagnosis not present

## 2020-12-20 DIAGNOSIS — M7552 Bursitis of left shoulder: Secondary | ICD-10-CM | POA: Diagnosis not present

## 2020-12-21 NOTE — Progress Notes (Signed)
Left shoulder MRI shows medium tendinitis of the supraspinatus rotator cuff tendon without tear.  Bursitis is also present.  Please return to clinic to go over these results in full detail and discuss treatment plan and options.  Normally we would treat this with a steroid injection but based on your intolerance to steroids we may have to think of other options.
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
## 2020-12-22 ENCOUNTER — Other Ambulatory Visit: Payer: Self-pay

## 2020-12-22 ENCOUNTER — Encounter: Payer: Self-pay | Admitting: Family Medicine

## 2020-12-22 ENCOUNTER — Ambulatory Visit: Payer: Medicare PPO | Admitting: Family Medicine

## 2020-12-22 VITALS — BP 110/70 | HR 62 | Ht 65.5 in | Wt 197.0 lb

## 2020-12-22 DIAGNOSIS — G8929 Other chronic pain: Secondary | ICD-10-CM

## 2020-12-22 DIAGNOSIS — M25512 Pain in left shoulder: Secondary | ICD-10-CM

## 2020-12-22 NOTE — Patient Instructions (Addendum)
Thank you for coming in today.  You should hear from surgery soon.  Let me know if you do not hear.   Keep me updated.

## 2020-12-23 NOTE — Telephone Encounter (Signed)
Spoke with patient who states that she would not like to do LexiScan nuclear stress test but would instead like to more information regarding the CCTA scan that was originally ordered.   Went over instructions for CCTA and information with patient, patient would like to speak with someone from CT regarding the test to ask further questions about the contrast used and the entire process.   Advised patient that I would forward message to CT.   Advised patient to call back to office with any issues, questions, or concerns. Patient verbalized understanding.

## 2020-12-23 NOTE — Telephone Encounter (Signed)
Spoke with patient who would like to call back later to discuss. Patient will call back to office.

## 2020-12-23 NOTE — Telephone Encounter (Signed)
PT is returning Jenna's call.Please advise

## 2020-12-24 ENCOUNTER — Telehealth (HOSPITAL_COMMUNITY): Payer: Self-pay | Admitting: Emergency Medicine

## 2020-12-24 NOTE — Telephone Encounter (Signed)
Spoke with CT tech Enid Skeens) who states they can scan patients with noted 'INTOLERANCES' such as N/V. Pt denies having allergic reaction type symptoms including itching, hives, sob, swelling  Rockwell Alexandria RN Navigator Cardiac Imaging [facility] [phone] Office  [phone] Cell

## 2020-12-24 NOTE — Telephone Encounter (Signed)
Pt calling to discuss cardiac CTA which was ordered by Jacques Navy.   Pt has IVP dye listed in her allergies although her symptoms were n/v which is not a true allergy.   Because this is listed in the allergies, she must take 13 hr prep of prednisone/benadryl but is UNWILLING to do so due to other side effects of those medications.  Will inform Dr. Wilnette Kales RN Navigator Cardiac Imaging [facility] [phone] Office  [phone] Cell

## 2020-12-24 NOTE — Telephone Encounter (Signed)
Called patient to ask what I could do to clarify any questions or concerns she had for a cardiac CTA.   She states she read up about something given during the scan that started with a 'd'. I informed her that the test requires contrast to help visulize the heart vessels. The name of the contrast is omnipaque.   She had a CT of her abd in 2020 and received this contrast which she said made her vomit. I told her this is NOT a true allergy but rather an intolerance to the contrast, a mere side effect.   She said she was going to find the actual 'd' word and give me a call back to clarify what she read.  Rockwell Alexandria RN Navigator Cardiac Imaging [facility] [phone] Office  [phone] Cell

## 2020-12-25 ENCOUNTER — Telehealth (HOSPITAL_COMMUNITY): Payer: Self-pay | Admitting: *Deleted

## 2020-12-25 ENCOUNTER — Telehealth (HOSPITAL_COMMUNITY): Payer: Self-pay | Admitting: Emergency Medicine

## 2020-12-25 DIAGNOSIS — I10 Essential (primary) hypertension: Secondary | ICD-10-CM | POA: Diagnosis not present

## 2020-12-25 DIAGNOSIS — R0600 Dyspnea, unspecified: Secondary | ICD-10-CM | POA: Diagnosis not present

## 2020-12-25 DIAGNOSIS — R072 Precordial pain: Secondary | ICD-10-CM | POA: Diagnosis not present

## 2020-12-25 NOTE — Telephone Encounter (Signed)
Returning pt's call regardin upcoming cardiac imaging study; pt verbalizes understanding of appt date/time, parking situation and where to check in, pre-test NPO status and medications ordered, and verified current allergies; name and call back number provided for further questions should they arise  Larey Brick RN Navigator Cardiac Imaging [facility] [phone] office [phone] cell.

## 2020-12-26 LAB — BASIC METABOLIC PANEL
BUN/Creatinine Ratio: 13 (ref 9–23)
BUN: 10 mg/dL (ref 6–24)
CO2: 27 mmol/L (ref 20–29)
Calcium: 9.9 mg/dL (ref 8.7–10.2)
Chloride: 100 mmol/L (ref 96–106)
Creatinine, Ser: 0.77 mg/dL (ref 0.57–1.00)
Glucose: 75 mg/dL (ref 65–99)
Potassium: 3.6 mmol/L (ref 3.5–5.2)
Sodium: 142 mmol/L (ref 134–144)
eGFR: 92 mL/min/{1.73_m2} (ref >59)

## 2020-12-28 ENCOUNTER — Other Ambulatory Visit: Payer: Self-pay

## 2020-12-28 ENCOUNTER — Ambulatory Visit (HOSPITAL_COMMUNITY)
Admission: RE | Admit: 2020-12-28 | Discharge: 2020-12-28 | Disposition: A | Discharge status: Home / Self Care | Payer: Medicare PPO | Source: Ambulatory Visit | Attending: Internal Medicine | Admitting: Internal Medicine

## 2020-12-28 ENCOUNTER — Encounter (HOSPITAL_COMMUNITY): Payer: Self-pay

## 2020-12-28 DIAGNOSIS — R072 Precordial pain: Secondary | ICD-10-CM | POA: Insufficient documentation

## 2020-12-28 DIAGNOSIS — Z006 Encounter for examination for normal comparison and control in clinical research program: Secondary | ICD-10-CM

## 2020-12-28 MED ORDER — NITROGLYCERIN 0.4 MG SL SUBL: 0.8000 mg | SUBLINGUAL_TABLET | Freq: Once | SUBLINGUAL | Status: DC

## 2020-12-28 NOTE — Progress Notes (Signed)
Unable to start an IV in the ac of the correct gauge for the test.  Patient was assessed by multiple RNs and by two IV team nurses. Notified nurse navigator who will be contacting MD to discuss plan for patient's future testing. Patient discharged.

## 2020-12-28 NOTE — Research (Signed)
IDENTIFY Informed Consent                  Subject Name: Kathleen Solis   Subject met inclusion and exclusion criteria.  The informed consent form, study requirements and expectations were reviewed with the subject and questions and concerns were addressed prior to the signing of the consent form.  The subject verbalized understanding of the trial requirements.  The subject agreed to participate in the IDENTIFY trial and signed the informed consent at 10:17AM  on 12/28/20.  The informed consent was obtained prior to performance of any protocol-specific procedures for the subject.  A copy of the signed informed consent was given to the subject and a copy was placed in the subject's medical record.   Sallye Ober , Research Assistant

## 2020-12-29 ENCOUNTER — Ambulatory Visit (INDEPENDENT_AMBULATORY_CARE_PROVIDER_SITE_OTHER): Payer: Medicare PPO | Admitting: Orthopaedic Surgery

## 2020-12-29 ENCOUNTER — Telehealth: Payer: Self-pay

## 2020-12-29 ENCOUNTER — Encounter: Payer: Self-pay | Admitting: Orthopaedic Surgery

## 2020-12-29 ENCOUNTER — Telehealth: Payer: Self-pay | Admitting: Internal Medicine

## 2020-12-29 VITALS — Ht 65.5 in | Wt 200.0 lb

## 2020-12-29 DIAGNOSIS — G8929 Other chronic pain: Secondary | ICD-10-CM | POA: Diagnosis not present

## 2020-12-29 DIAGNOSIS — M25512 Pain in left shoulder: Secondary | ICD-10-CM | POA: Diagnosis not present
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
## 2020-12-29 NOTE — Telephone Encounter (Signed)
   Pt said her CT was cancelled because they couldn't find her veins, she have an echo tomorrow, she is wondering since she didn't get the CT she wanted to know if she still need to do the echo

## 2020-12-29 NOTE — Telephone Encounter (Signed)
Spoke to patient.  RN informed patient  Dr Merry Lofty would like for her to do echo tomorrow.   Patient wanted to know if andwhen will CCTA will be reschedule and if Dr Jacques Navy knew. RN informed patient that the radiology department will send Dr Jacques Navy a message. Someone will contact patient.  patient has appointment with Dr Jacques Navy May 16,2022

## 2020-12-30 ENCOUNTER — Other Ambulatory Visit: Payer: Self-pay

## 2020-12-30 ENCOUNTER — Ambulatory Visit (HOSPITAL_COMMUNITY): Payer: Medicare PPO | Attending: Cardiology

## 2020-12-30 DIAGNOSIS — I10 Essential (primary) hypertension: Secondary | ICD-10-CM | POA: Diagnosis not present

## 2020-12-30 DIAGNOSIS — R0600 Dyspnea, unspecified: Secondary | ICD-10-CM | POA: Diagnosis not present

## 2020-12-30 DIAGNOSIS — R072 Precordial pain: Secondary | ICD-10-CM | POA: Diagnosis not present

## 2020-12-30 DIAGNOSIS — R0609 Other forms of dyspnea: Secondary | ICD-10-CM

## 2020-12-30 LAB — ECHOCARDIOGRAM COMPLETE
Area-P 1/2: 3.15 cm2
S' Lateral: 2.3 cm

## 2020-12-31 ENCOUNTER — Ambulatory Visit: Payer: Medicare PPO | Admitting: Psychology

## 2020-12-31 ENCOUNTER — Ambulatory Visit (INDEPENDENT_AMBULATORY_CARE_PROVIDER_SITE_OTHER): Payer: Medicare PPO | Admitting: Psychology

## 2020-12-31 ENCOUNTER — Encounter: Payer: Self-pay | Admitting: Psychology

## 2020-12-31 DIAGNOSIS — G3184 Mild cognitive impairment, so stated: Secondary | ICD-10-CM | POA: Insufficient documentation

## 2020-12-31 DIAGNOSIS — G44219 Episodic tension-type headache, not intractable: Secondary | ICD-10-CM | POA: Diagnosis not present

## 2020-12-31 DIAGNOSIS — R3911 Hesitancy of micturition: Secondary | ICD-10-CM

## 2020-12-31 DIAGNOSIS — K219 Gastro-esophageal reflux disease without esophagitis: Secondary | ICD-10-CM | POA: Insufficient documentation

## 2020-12-31 DIAGNOSIS — N8111 Cystocele, midline: Secondary | ICD-10-CM | POA: Insufficient documentation

## 2020-12-31 DIAGNOSIS — R4189 Other symptoms and signs involving cognitive functions and awareness: Secondary | ICD-10-CM

## 2020-12-31 DIAGNOSIS — I6930 Unspecified sequelae of cerebral infarction: Secondary | ICD-10-CM

## 2020-12-31 HISTORY — DX: Mild cognitive impairment of uncertain or unknown etiology: G31.84

## 2020-12-31 HISTORY — DX: Hesitancy of micturition: R39.11

## 2020-12-31 NOTE — Progress Notes (Signed)
   Psychometrician Note   Cognitive testing was administered to Kathleen Solis by Wallace Keller, B.S. (psychometrist) under the supervision of Dr. Newman Nickels, Ph.D., licensed psychologist on 12/31/20. Kathleen Solis did not appear overtly distressed by the testing session per behavioral observation or responses across self-report questionnaires. Rest breaks were offered.    The battery of tests administered was selected by Dr. Newman Nickels, Ph.D. with consideration to Kathleen Solis's current level of functioning, the nature of her symptoms, emotional and behavioral responses during interview, level of literacy, observed level of motivation/effort, and the nature of the referral question. This battery was communicated to the psychometrist. Communication between Dr. Newman Nickels, Ph.D. and the psychometrist was ongoing throughout the evaluation and Dr. Newman Nickels, Ph.D. was immediately accessible at all times. Dr. Newman Nickels, Ph.D. provided supervision to the psychometrist on the date of this service to the extent necessary to assure the quality of all services provided.    Kathleen Solis will return within approximately 1-2 weeks for an interactive feedback session with Dr. Milbert Coulter at which time her test performances, clinical impressions, and treatment recommendations will be reviewed in detail. Kathleen Solis understands she can contact our office should she require our assistance before this time.  A total of 155 minutes of billable time were spent face-to-face with Kathleen Solis by the psychometrist. This includes both test administration and scoring time. Billing for these services is reflected in the clinical report generated by Dr. Newman Nickels, Ph.D.  This note reflects time spent with the psychometrician and does not include test scores or any clinical interpretations made by Dr. Milbert Coulter. The full report will follow in a separate note.

## 2020-12-31 NOTE — Progress Notes (Signed)
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       ACS Word Choice: --- --- Within Expectation  Dot Counting Test: --- --- Within Expectation  RBANS Effort Index: --- --- Within Expectation  WAIS-IV Reliable Digit Span: --- --- Within Expectation  D-KEFS Color Word Effort Index: --- --- Within Expectation       Orientation:      Raw Score Percentile   NAB Orientation, Form 1 29/29 --- ---       Cognitive Screening:           Raw Score Percentile   SLUMS: 15/30 --- ---       RBANS, Form A: Standard Score/ Scaled Score Percentile   Total Score 69 2 Exceptionally Low  Immediate Memory 53 <1 Exceptionally Low    List Learning 2 <1 Exceptionally Low    Story Memory 3 1 Exceptionally Low  Visuospatial/Constructional 84 14 Below Average    Figure Copy 10 50 Average    Line Orientation 11/20 3-9 Well Below Average  Language 87 19 Below Average    Picture Naming 7/10 <2 Exceptionally Low    Semantic Fluency 11 63 Average  Attention 94 34 Average    Digit Span 10 50 Average    Coding 8 25 Average  Delayed Memory 60 <1 Exceptionally Low    List Recall 2/10 3-9 Well Below Average    List Recognition 14/20 <2 Exceptionally Low    Story Recall 6 9 Below Average    Story Recognition 7/12 5-7  Well Below Average    Figure Recall 5 5 Well Below Average    Figure Recognition 7/8 53-69 Average       Intellectual Functioning:           Standard Score Percentile   Test of Premorbid Functioning: 76 5 Well Below Average       Attention/Executive Function:          Trail Making Test (TMT): Raw Score (T Score) Percentile     Part A 28 secs.,  0 errors (55) 69 Average    Part B 138 secs.,  2 errors (39) 14 Below Average         Scaled Score Percentile   WAIS-IV Digit Span: 9 37 Average    Forward 10 50 Average    Backward 9 37 Average    Sequencing 8 25 Average       D-KEFS Color-Word Interference Test: Raw Score (Scaled Score) Percentile     Color Naming 48 secs. (2) <1 Exceptionally Low    Word Reading 25 secs. (9) 37 Average    Inhibition 74 secs. (7) 16 Below Average      Total Errors 2 errors (9) 37  Average    Inhibition/Switching 89 secs. (7) 16 Below Average      Total Errors 2 errors (10) 50 Average       D-KEFS Verbal Fluency Test: Raw Score (Scaled Score) Percentile     Letter Total Correct 34 (9) 37 Average    Category Total Correct 29 (6) 9 Below Average    Category Switching Total Correct 9 (5) 5 Well Below Average    Category Switching Accuracy 8 (6) 9 Below Average      Total Set Loss Errors 1 (11) 63 Average      Total Repetition Errors 1 (12) 75 Above Average       Language:          Verbal Fluency Test: Raw Score (T Score) Percentile     Phonemic Fluency (FAS) 34 (48) 42 Average    Animal Fluency 13 (38) 12 Below Average        NAB Language Module, Form 1: T Score Percentile     Oral Production 63 91 Well Above Average    Auditory Comprehension 57 75 Above Average    Naming 27/31 (30) 2 Well Below Average       Visuospatial/Visuoconstruction:      Raw Score Percentile   Clock Drawing: 10/10 --- Within Normal Limits       NAB Spatial Module, Form 1: T Score Percentile     Visual Discrimination 33 5 Well Below Average        Scaled Score  Percentile   WAIS-IV Block Design: 6 9 Below Average       Sensory-Motor:          [place] Grooved Pegboard Test: Raw Score Percentile     Dominant Hand 138 secs.,  1 drop 1 Exceptionally Low    Non-Dominant Hand 153 secs.,  1 drop 3 Well Below Average       Finger Tapping Test: Mean Percentile     Dominant Hand 41 27 Average    Non-Dominant Hand 38 38 Average       Mood and Personality:      Raw Score Percentile   Beck Depression Inventory - II: 14 --- Mild  PROMIS Anxiety Questionnaire: 16 --- Mild       Additional Questionnaires:      Raw Score Percentile   PROMIS Sleep Disturbance Questionnaire: 25 --- Mild   Informed Consent and Coding/Compliance:   The current evaluation represents a clinical evaluation for the purposes previously outlined by the referral source and is in no way reflective of a forensic evaluation.   Ms. Pallas was provided with a verbal description of the nature and purpose of the present neuropsychological evaluation. Also reviewed were the foreseeable risks and/or discomforts and benefits of the procedure, limits of confidentiality, and mandatory reporting requirements of this provider. The patient was given the opportunity to ask questions and receive answers about the evaluation. Oral consent to participate was provided by the patient.   This evaluation was conducted by Newman Nickels, Ph.D., licensed clinical neuropsychologist. Ms. Lapaglia completed a clinical interview with Dr. Milbert Coulter, billed as one unit [phone], and 155 minutes of cognitive testing and scoring, billed as one unit [phone] and four additional units [id number]. Psychometrist Wallace Keller, B.S., assisted Dr. Milbert Coulter with test administration and scoring procedures. As a separate and discrete service, Dr. Milbert Coulter spent a total of 180 minutes in interpretation and report writing billed as one unit [phone] and two units [id number].

## 2021-01-01 ENCOUNTER — Encounter: Payer: Self-pay | Admitting: Psychology

## 2021-01-04 ENCOUNTER — Telehealth: Payer: Self-pay | Admitting: Internal Medicine

## 2021-01-04 ENCOUNTER — Telehealth: Payer: Medicare PPO | Admitting: Internal Medicine

## 2021-01-04 ENCOUNTER — Ambulatory Visit: Payer: Medicare PPO | Admitting: Internal Medicine

## 2021-01-04 NOTE — Telephone Encounter (Signed)
Made patient aware of the following regarding her Echo results.   Parke Poisson, MD  01/04/2021 10:17 AM EDT      Grossly normal echocardiogram, mild MR which we can reassess with another echo in 2 years.   Patient states she missed her appointment this morning and got rescheduled for 6/3 with Joni Reining. Patient would like to see if Dr. Jacques Navy wanted any other testing prior to that appointment since the CCTA was not able to be completed due to IV issues.   Advised patient I would forward message to Dr. Jacques Navy.   Advised patient to call back to office with any issues, questions, or concerns. Patient verbalized understanding.

## 2021-01-04 NOTE — Telephone Encounter (Signed)
Pt missed her appt this morning because she cant drop her son off at school before 8:45am, pt's wants to know if someone can call her with her Echo results on the phone. Pt's appt has been rescheduled

## 2021-01-06 NOTE — Telephone Encounter (Signed)
Returned call to patient. Made patient aware of Dr. Lupe Carney recommendations.   Advised patient to call back to office with any issues, questions, or concerns. Patient verbalized understanding.   Let's have her discuss this with KL at her return visit, I planned to discuss this today but we were not able to since missed appt - it will take some discussion to decide what the next best option is given very difficult IV access.   Message text

## 2021-01-08 ENCOUNTER — Ambulatory Visit: Payer: Medicare PPO | Admitting: Internal Medicine

## 2021-01-08 ENCOUNTER — Encounter: Payer: Self-pay | Admitting: Internal Medicine

## 2021-01-08 ENCOUNTER — Other Ambulatory Visit: Payer: Self-pay

## 2021-01-08 VITALS — BP 112/72 | HR 71 | Ht 65.5 in | Wt 204.4 lb

## 2021-01-08 DIAGNOSIS — I10 Essential (primary) hypertension: Secondary | ICD-10-CM

## 2021-01-08 DIAGNOSIS — I69349 Monoplegia of lower limb following cerebral infarction affecting unspecified side: Secondary | ICD-10-CM | POA: Diagnosis not present

## 2021-01-08 DIAGNOSIS — E782 Mixed hyperlipidemia: Secondary | ICD-10-CM

## 2021-01-08 DIAGNOSIS — R0609 Other forms of dyspnea: Secondary | ICD-10-CM

## 2021-01-08 DIAGNOSIS — I6523 Occlusion and stenosis of bilateral carotid arteries: Secondary | ICD-10-CM

## 2021-01-08 DIAGNOSIS — R0600 Dyspnea, unspecified: Secondary | ICD-10-CM | POA: Diagnosis not present

## 2021-01-08 DIAGNOSIS — R072 Precordial pain: Secondary | ICD-10-CM

## 2021-01-08 NOTE — Patient Instructions (Signed)
Medication Instructions:  No Changes In Medications at this time.  *If you need a refill on your cardiac medications before your next appointment, please call your pharmacy*  Testing/Procedures: Your physician has requested that you have a lexiscan myoview. For further information please visit [email or web address] Please follow instruction sheet, as given.   The test will take approximately 3 to 4 hours to complete; you may bring reading material.  If someone comes with you to your appointment, they will need to remain in the main lobby due to limited space in the testing area.    How to prepare for your Myocardial Perfusion Test:  Do not eat or drink 3 hours prior to your test, except you may have water.  Do not consume products containing caffeine (regular or decaffeinated) 12 hours prior to your test. (ex: coffee, chocolate, sodas, tea).  Do wear comfortable clothes (no dresses or overalls) and walking shoes, tennis shoes preferred (No heels or open toe shoes are allowed).  Do NOT wear cologne, perfume, aftershave, or lotions (deodorant is allowed).  If you use an inhaler, use it the AM of your test and bring it with you.   If you use a nebulizer, use it the AM of your test.   If these instructions are not followed, your test will have to be rescheduled.  Follow-Up: At [facility], you and your health needs are our priority.  As part of our continuing mission to provide you with exceptional heart care, we have created designated Provider Care Teams.  These Care Teams include your primary Cardiologist (physician) and Advanced Practice Providers (APPs -  Physician Assistants and Nurse Practitioners) who all work together to provide you with the care you need, when you need it.  Your next appointment:   RIGHT AFTER LEXISCAN    The format for your next appointment:   In Person  Provider:   You may see Parke Poisson, MD or one of the following Advanced Practice Providers on  your designated Care Team:    Theodore Demark, PA-C  Joni Reining, DNP, ANP  Any APP

## 2021-01-08 NOTE — Progress Notes (Signed)
Cardiology Office Note:    Date:  01/08/2021   ID:  Kathleen Solis, DOB 02-Apr-1966, MRN [id number]  PCP:  Pincus Sanes, MD  Cardiologist:  Parke Poisson, MD  Electrophysiologist:  None   Referring MD: Pincus Sanes, MD /Dr. Everlena Cooper Neurology  Chief Complaint/Reason for Referral: HTN, chest pain, Shortness of breath  History of Present Illness:    Kathleen Solis is a 55 y.o. female with a history of hypertension, hyperlipidemia, carotid artery disease, tension type headache and history of stroke who presents for further evaluation of HTN.   01/08/2021: She is concerned about feeling short of breath while walking, such as [place]. She feels very fatigued and feels like she may pass out. After resting, she will continue. During most activities she continues to have the shortness of breath.   We attempted to get a coronary CTA but due to size of IV required and difficult access, the study was canceled.  We discussed that perhaps a Lexiscan Myoview may be better since we may not need as large of an IV site since we will not be using IV contrast.  She is agreeable to this.  Previously she nearly fell off of a treadmill during physical therapy for her arm. We discussed the option of a chemical stress test to avoid using a treadmill.  She denies any chest pain, palpitations, headaches, or lightheadedness. Also has no lower extremity edema, orthopnea or PND.   Past Medical History:  Diagnosis Date  . Allergic rhinitis 08/16/2016  . Benign essential hypertension 03/03/2015  . Bone spur of right foot 02/08/2016  . Bunion of left foot 02/08/2016  . Carotid arterial disease 04/10/2016   03/2016: right 40-59%, left < 40%   . Chest pain 02/14/2017  . Cyst of left ovary 03/03/2015  . Diverticulitis of colon without hemorrhage 02/25/2016  . Epigastric pain 05/06/2020  . Episodic tension-type headache 01/12/2016   Taking motrin 200 mg bid  . GERD (gastroesophageal  reflux disease)   . History of cerebrovascular accident (CVA) with residual deficit 2001   Continue aspirin 81 mg daily, statin and current blood pressure medications; Blood pressure has been well controlled and cholesterol has been at goal  . Hyperglycemia 02/14/2017   Chronic; encouraged low sugar/carbohydrate diet and regular exercise  . Hyperlipidemia    diet controlled - no med  . Leg neuralgia, right    Related to CVA   . Lumbar back pain 05/10/2018  . Lumbar radiculopathy 05/15/2018   Pain seems consistent with radiculopathy  . MCI (mild cognitive impairment) with memory loss 12/31/2020  . Midline cystocele   . Mild asthma 08/27/2020  . Onychomycosis 02/21/2018   Toenails; Ciclopirox prescribed  . Sciatica    right  . Sinus infection 03/22/2019  . SVD (spontaneous vaginal delivery)    x 2  . Tinnitus of right ear 11/16/2016  . Vitamin B12 deficiency 02/23/2018    Past Surgical History:  Procedure Laterality Date  . ABDOMINAL HYSTERECTOMY     partial  . ANAL FISSURE REPAIR    . APPENDECTOMY    . CHOLECYSTECTOMY    . CYSTOSCOPY N/A 10/04/2017   Procedure: CYSTOSCOPY;  Surgeon: Gerald Leitz, MD;  Location: [facility];  Service: Gynecology;  Laterality: N/A;  . FOOT SURGERY Left    big toe bunion removed  . LAPAROSCOPIC BILATERAL SALPINGO OOPHERECTOMY Bilateral 10/04/2017   Procedure: LAPAROSCOPIC BILATERAL SALPINGO OOPHORECTOMY, PELVIC WASHINGS;  Surgeon: Gerald Leitz, MD;  Location: [facility]  ORS;  Service: Gynecology;  Laterality: Bilateral;  . TUBAL LIGATION    . WISDOM TOOTH EXTRACTION      Current Medications: Current Meds  Medication Sig  . acetaminophen (TYLENOL) 500 MG tablet Take 500 mg by mouth every 6 (six) hours as needed. Patients states she usually takes 2-4 tabs daily  . atorvastatin (LIPITOR) 10 MG tablet Take 1 tablet (10 mg total) by mouth at bedtime.  . budesonide-formoterol (SYMBICORT) 160-4.5 MCG/ACT inhaler Inhale 2 puffs into the lungs 2 (two) times daily.  .  fluticasone (FLONASE) 50 MCG/ACT nasal spray Place 2 sprays into both nostrils daily.  . hydrochlorothiazide (HYDRODIURIL) 25 MG tablet Take 1 tablet (25 mg total) by mouth daily.  . metoprolol succinate (TOPROL-XL) 100 MG 24 hr tablet Take 1 tablet (100 mg total) by mouth at bedtime.  . potassium chloride (KLOR-CON 10) 10 MEQ tablet Take 1 tablet (10 mEq total) by mouth 2 (two) times daily.  . promethazine-dextromethorphan (PROMETHAZINE-DM) 6.25-15 MG/5ML syrup Take 5 mLs by mouth 4 (four) times daily as needed for cough.  . vitamin B-12 (CYANOCOBALAMIN) 1000 MCG tablet Take 1,000 mcg by mouth daily.  . [DISCONTINUED] aspirin EC 81 MG tablet Take 1 tablet (81 mg total) by mouth daily. Swallow whole.  . [DISCONTINUED] Misc Natural Products (ELDERBERRY ZINC/VIT C/IMMUNE MT) Take 2 each by mouth daily.     Allergies:   Benzonatate, Darvon [propoxyphene], Ibuprofen, Morphine and related, Prednisone, Sulfa antibiotics, Tussionex pennkinetic er [hydrocod polst-cpm polst er], Buprenorphine hcl, Ivp dye [iodinated diagnostic agents], and Prochlorperazine edisylate   Social History   Tobacco Use  . Smoking status: Never Smoker  . Smokeless tobacco: Never Used  Vaping Use  . Vaping Use: Never used  Substance Use Topics  . Alcohol use: No    Alcohol/week: 0.0 standard drinks  . Drug use: No     Family History: The patient's family history includes Anuerysm in her cousin; Anuerysm (age of onset: 54) in her mother; Breast cancer in her cousin, niece, and sister; Cataracts in her sister and sister; Cerebral palsy in her son; Developmental delay in her nephew; Diabetes in her sister; Esophageal cancer in her nephew; Glaucoma in her sister; Hypertension in her sister; Pancreatic cancer in her father; Retinal detachment in her brother. There is no history of Colon polyps, Prostate cancer, Rectal cancer, Stomach cancer, or Uterine cancer.  ROS:   Please see the history of present illness.    (+)  Shortness of breath (+) Fatigue (+) Pre-syncope All other systems reviewed and are negative.  EKGs/Labs/Other Studies Reviewed:    The following studies were reviewed today:  Echo 12/30/2020: 1. Left ventricular ejection fraction by 3D volume is 72 %. The left  ventricle has normal function. The left ventricle has no regional wall  motion abnormalities. Left ventricular diastolic parameters were normal.  2. Right ventricular systolic function is normal. The right ventricular  size is normal. There is normal pulmonary artery systolic pressure.  3. The mitral valve is normal in structure. Mild mitral valve  regurgitation. No evidence of mitral stenosis.  4. The aortic valve is normal in structure. Aortic valve regurgitation is  not visualized. No aortic stenosis is present.  5. The inferior vena cava is normal in size with greater than 50%  respiratory variability, suggesting right atrial pressure of 3 mmHg.  Vas US Carotid Duplex Bilateral 11/13/2020: Right Carotid: There is no evidence of stenosis in the right ICA. The  extracranial vessels were near-normal with only minimal      wall thickening or plaque.   Left Carotid: There is no evidence of stenosis in the left ICA. The     extracranialvessels were near-normal with only minimal wall    thickening orplaque.   Vertebrals: Bilateral vertebral arteries demonstrate antegrade flow.   Subclavians: Normal flow hemodynamics were seen in bilateral      subclavian arteries.   EKG:  01/08/2021: EKG is not ordered today. 11/24/2020: Sinus rhythm, nonspecific T wave abnormality  I have independently reviewed the images from CT abdomen pelvis without contrast.  May 10, 2020, and 11/02/2019.  No coronary artery calcifications noted.  Recent Labs: 05/10/2020: Hemoglobin 13.8; Platelets 261 11/03/2020: ALT 15; TSH 0.88 12/25/2020: BUN 10; Creatinine, Ser 0.77; Potassium 3.6; Sodium 142  Recent Lipid Panel     Component Value Date/Time   CHOL 133 11/03/2020 1009   CHOL 200 (H) 12/26/2017 1421   TRIG 82.0 11/03/2020 1009   HDL 57.30 11/03/2020 1009   HDL 53 12/26/2017 1421   CHOLHDL 2 11/03/2020 1009   VLDL 16.4 11/03/2020 1009   LDLCALC 60 11/03/2020 1009   LDLCALC 122 (H) 12/26/2017 1421    Physical Exam:    VS:  BP 112/72   Pulse 71   Ht 5' 5.5" (1.664 m)   Wt 204 lb 6.4 oz (92.7 kg)   SpO2 98%   BMI 33.50 kg/m     Wt Readings from Last 5 Encounters:  01/08/21 204 lb 6.4 oz (92.7 kg)  12/29/20 200 lb (90.7 kg)  12/22/20 197 lb (89.4 kg)  12/14/20 201 lb 3.2 oz (91.3 kg)  11/24/20 200 lb 12.8 oz (91.1 kg)    Constitutional: No acute distress Eyes: sclera non-icteric, normal conjunctiva and lids ENMT: normal dentition, moist mucous membranes Cardiovascular: regular rhythm, normal rate, no murmurs. S1 and S2 normal. Radial pulses normal bilaterally. No jugular venous distention.  Respiratory: clear to auscultation bilaterally GI : normal bowel sounds, soft and nontender. No distention.   MSK: extremities warm, well perfused. No edema.  NEURO: grossly nonfocal exam, moves all extremities. PSYCH: alert and oriented x 3, normal mood and affect.    ASSESSMENT:    1. Primary hypertension   2. Precordial pain   3. 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 Mixed hyperlipidemia   5. Bilateral carotid artery stenosis   6. Dyspnea on exertion    PLAN:    Precordial pain Dyspnea on exertion -Unable to get a coronary CTA due to difficult IV access.  We will attempt a Scientist[facility]Lexiscan [facility]iew [facility] [address] [facility].  We will follow-up after results of stress test.  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  Neurology recommended  restarting a baby aspirin, would agree with this.  Mixed hyperlipidemia - Plan: EKG 12-Lead Lipids are well controlled on atorvastatin 10 mg daily.   Total time of encounter: 30 minutes total time of encounter, including 20 minutes spent in face-to-face patient care on the date of this encounter. This time includes coordination of care and counseling regarding above mentioned problem list. Remainder of non-face-to-face time involved reviewing chart documents/testing relevant to the patient encounter and documentation in the medical record. I have independently reviewed documentation from referring provider.   Weston BrassGayatri Leanne Sisler, MD,  [facility] [place]  [facility]    Medication Adjustments/Labs and Tests Ordered: Current medicines are reviewed at length with the patient today.  Concerns regarding medicines are outlined above.   Orders Placed This Encounter  Procedures  . MYOCARDIAL PERFUSION IMAGING    No orders of the defined types were placed in this encounter.   Patient Instructions  Medication Instructions:  No Changes In Medications at this time.  *If you need a refill on your cardiac medications before your next appointment, please call your pharmacy*  Testing/Procedures: Your physician has requested that you have a lexiscan myoview. For further information please visit [email or web address] Please follow instruction sheet, as given.   The test will take approximately 3 to 4 hours to complete; you may bring reading material.  If someone comes with you to your appointment, they will need to remain in the main lobby due to limited space in the testing area.    How to prepare for your Myocardial Perfusion Test:  Do not eat or drink 3 hours prior to your test, except you may have water.  Do not consume products containing caffeine (regular or decaffeinated) 12 hours prior to your test. (ex: coffee, chocolate, sodas, tea).  Do wear comfortable clothes (no dresses or overalls)  and walking shoes, tennis shoes preferred (No heels or open toe shoes are allowed).  Do NOT wear cologne, perfume, aftershave, or lotions (deodorant is allowed).  If you use an inhaler, use it the AM of your test and bring it with you.   If you use a nebulizer, use it the AM of your test.   If these instructions are not followed, your test will have to be rescheduled.  Follow-Up: At [facility], you and your health needs are our priority.  As part of our continuing mission to provide you with exceptional heart care, we have created designated Provider Care Teams.  These Care Teams include your primary Cardiologist (physician) and Advanced Practice Providers (APPs -  Physician Assistants and Nurse Practitioners) who all work together to provide you with the care you need, when you need it.  Your next appointment:   RIGHT AFTER LEXISCAN    The format for your next appointment:   In Person  Provider:   You may see Parke Poisson, MD or one of the following Advanced Practice Providers on your designated Care Team:    Theodore Demark, PA-C  Joni Reining, DNP, ANP  Any APP      I,Mathew Stumpf,acting as a scribe for Parke Poisson, MD.,have documented all relevant documentation on the behalf of Parke Poisson, MD,as directed by  Parke Poisson, MD while in the presence of Parke Poisson, MD.  I, Parke Poisson, MD, have reviewed all documentation for this visit. The documentation on 01/08/21 for the exam, diagnosis, procedures, and orders are all accurate and complete.

## 2021-01-14 ENCOUNTER — Other Ambulatory Visit: Payer: Self-pay

## 2021-01-14 ENCOUNTER — Ambulatory Visit (INDEPENDENT_AMBULATORY_CARE_PROVIDER_SITE_OTHER): Payer: Medicare PPO | Admitting: Psychology

## 2021-01-14 DIAGNOSIS — I69311 Memory deficit following cerebral infarction: Secondary | ICD-10-CM

## 2021-01-14 DIAGNOSIS — I6930 Unspecified sequelae of cerebral infarction: Secondary | ICD-10-CM

## 2021-01-14 DIAGNOSIS — G3184 Mild cognitive impairment, so stated: Secondary | ICD-10-CM

## 2021-01-14 NOTE — Progress Notes (Signed)
   Neuropsychology Feedback Session Kathleen Solis. [facility] [place] Department of Neurology  Reason for Referral:   Tish Simmonsis a 55 y.o. right-handed African-American female referred by Shon Millet, D.O.,to characterize hercurrent cognitive functioning and assist with diagnostic clarity and treatment planning in the context of residual cognitive deficits stemming from a remote left frontal stroke, as well as progressive short-term memory dysfunction.   Feedback:   [name] completed a comprehensive neuropsychological evaluation on 12/31/2020. Please refer to that encounter for the full report and recommendations. Briefly, results suggested notable impairments surrounding encoding (i.e., learning) and retrieval aspects of memory, verbal worse than visual. A normative impairment was also exhibited across confrontation naming; however, this may or may not represent a significant decline. Performance variability was further exhibited across response inhibition, visuospatial abilities, recognition/consolidation aspects of memory, and motor abilities. The etiology for cognitive dysfunction is unclear at the present time. A brain MRI on 01/19/2016 suggested nonspecific cerebral white matter signal changes in left frontal lobe and parietal white matter, "possibly indicative of remote ischemic stroke." However, brain MRIs before (2012, 2015) and after (2018, 2022) did not comment on a remote infarct and rather suggested small vessel ischemic changes. Test results are not strongly lateralizing or localizing. While a vascular etiology (i.e., mild vascular neurocognitive disorder) may be most likely given lack of other alternatives, continued medical monitoring will be important moving forward.   [name] was accompanied by her husband during the current feedback session. Content of the current session focused on the results of her neuropsychological evaluation. [name] and her husband  were given the opportunity to ask questions and their questions were answered. They were encouraged to reach out should additional questions arise. A copy of her report was provided at the conclusion of the visit.      20 minutes were spent conducting the current feedback session with [name], billed as one unit [phone].

## 2021-01-19 ENCOUNTER — Encounter: Payer: Self-pay | Admitting: Physical Therapy

## 2021-01-19 ENCOUNTER — Ambulatory Visit (INDEPENDENT_AMBULATORY_CARE_PROVIDER_SITE_OTHER): Payer: Medicare PPO | Admitting: Physical Therapy

## 2021-01-19 ENCOUNTER — Other Ambulatory Visit: Payer: Self-pay

## 2021-01-19 DIAGNOSIS — M542 Cervicalgia: Secondary | ICD-10-CM

## 2021-01-19 DIAGNOSIS — M6281 Muscle weakness (generalized): Secondary | ICD-10-CM

## 2021-01-19 DIAGNOSIS — M25512 Pain in left shoulder: Secondary | ICD-10-CM

## 2021-01-19 NOTE — Patient Instructions (Signed)
Access Code: [email or web address] URL: [email or web address] Date: 01/19/2021 Prepared by: Zebedee Iba  Exercises Seated Upper Trap Stretch - 2 x daily - 7 x weekly - 3 reps - 30 hold - 1 sets Seated Scapular Retraction - 2 x daily - 7 x weekly - 2 sets - 10 reps Standing Isometric Cervical Sidebending with Manual Resistance - 2 x daily - 7 x weekly - 2 sets - 5 reps - 2 hold

## 2021-01-19 NOTE — Addendum Note (Signed)
Addended by: Bea [name] B on: 01/19/2021 09:41 AM   Modules accepted: Orders
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
## 2021-01-19 NOTE — Addendum Note (Signed)
Addended by: Weston Brass A on: 01/19/2021 09:44 AM   Modules accepted: Orders

## 2021-01-20 ENCOUNTER — Telehealth (HOSPITAL_COMMUNITY): Payer: Self-pay | Admitting: *Deleted

## 2021-01-20 ENCOUNTER — Encounter (HOSPITAL_COMMUNITY): Payer: Self-pay | Admitting: *Deleted

## 2021-01-20 NOTE — Telephone Encounter (Signed)
Patient given detailed instructions per Myocardial Perfusion Study Information Sheet for the test on 01/27/21 at 0800. Patient notified to arrive 15 minutes early and that it is imperative to arrive on time for appointment to keep from having the test rescheduled.  If you need to cancel or reschedule your appointment, please call the office within 24 hours of your appointment. . Patient verbalized understanding.Kevonna Nolte, Adelene Idler Mychart letter sent with instructions.

## 2021-01-22 ENCOUNTER — Ambulatory Visit: Payer: Medicare PPO | Admitting: Adult Health

## 2021-01-25 ENCOUNTER — Ambulatory Visit (INDEPENDENT_AMBULATORY_CARE_PROVIDER_SITE_OTHER): Payer: Medicare PPO | Admitting: Physical Therapy

## 2021-01-25 ENCOUNTER — Other Ambulatory Visit: Payer: Self-pay

## 2021-01-25 ENCOUNTER — Encounter: Payer: Self-pay | Admitting: Physical Therapy

## 2021-01-25 DIAGNOSIS — M79605 Pain in left leg: Secondary | ICD-10-CM

## 2021-01-25 DIAGNOSIS — M542 Cervicalgia: Secondary | ICD-10-CM

## 2021-01-25 DIAGNOSIS — M6281 Muscle weakness (generalized): Secondary | ICD-10-CM | POA: Diagnosis not present

## 2021-01-25 DIAGNOSIS — M25512 Pain in left shoulder: Secondary | ICD-10-CM | POA: Diagnosis not present

## 2021-01-25 DIAGNOSIS — M545 Low back pain, unspecified: Secondary | ICD-10-CM
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
## 2021-01-26 ENCOUNTER — Encounter (HOSPITAL_COMMUNITY): Payer: Medicare PPO

## 2021-01-27 ENCOUNTER — Ambulatory Visit (HOSPITAL_COMMUNITY): Payer: Medicare PPO | Attending: Cardiology

## 2021-01-27 ENCOUNTER — Other Ambulatory Visit: Payer: Self-pay

## 2021-01-27 ENCOUNTER — Encounter: Payer: Medicare PPO | Admitting: Rehabilitative and Restorative Service Providers"

## 2021-01-27 DIAGNOSIS — R072 Precordial pain: Secondary | ICD-10-CM | POA: Diagnosis not present

## 2021-01-27 LAB — MYOCARDIAL PERFUSION IMAGING
LV dias vol: 60 mL (ref 46–106)
LV sys vol: 17 mL
Peak HR: 86 {beats}/min
Rest HR: 59 {beats}/min
SDS: 4
SRS: 0
SSS: 4
TID: 0.96

## 2021-01-27 MED ORDER — REGADENOSON 0.4 MG/5ML IV SOLN
0.4000 mg | Freq: Once | INTRAVENOUS | Status: AC
  Administered 2021-01-27: 0.4 mg via INTRAVENOUS

## 2021-01-27 MED ORDER — TECHNETIUM TC 99M TETROFOSMIN IV KIT
10.5000 | PACK | Freq: Once | INTRAVENOUS | Status: AC | PRN
  Administered 2021-01-27: 10.5 via INTRAVENOUS
  Filled 2021-01-27: qty 11

## 2021-01-27 MED ORDER — TECHNETIUM TC 99M TETROFOSMIN IV KIT
32.4000 | PACK | Freq: Once | INTRAVENOUS | Status: AC | PRN
  Administered 2021-01-27: 32.4 via INTRAVENOUS
  Filled 2021-01-27: qty 33

## 2021-02-01 ENCOUNTER — Telehealth: Payer: Self-pay | Admitting: Internal Medicine

## 2021-02-01 NOTE — Telephone Encounter (Signed)
Patient called and said that she has been having chest pain since Saturday. Transferred to team health.
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
## 2021-02-02 ENCOUNTER — Encounter: Payer: Medicare PPO | Admitting: Physical Therapy

## 2021-02-03 ENCOUNTER — Other Ambulatory Visit: Payer: Self-pay

## 2021-02-03 ENCOUNTER — Ambulatory Visit (INDEPENDENT_AMBULATORY_CARE_PROVIDER_SITE_OTHER): Payer: Medicare PPO

## 2021-02-03 ENCOUNTER — Encounter: Payer: Self-pay | Admitting: Internal Medicine

## 2021-02-03 ENCOUNTER — Ambulatory Visit: Payer: Medicare PPO | Admitting: Internal Medicine

## 2021-02-03 VITALS — BP 112/76 | HR 58 | Temp 98.2°F | Ht 65.5 in | Wt 199.2 lb

## 2021-02-03 DIAGNOSIS — E538 Deficiency of other specified B group vitamins: Secondary | ICD-10-CM | POA: Diagnosis not present

## 2021-02-03 DIAGNOSIS — I6523 Occlusion and stenosis of bilateral carotid arteries: Secondary | ICD-10-CM | POA: Diagnosis not present

## 2021-02-03 DIAGNOSIS — R079 Chest pain, unspecified: Secondary | ICD-10-CM | POA: Diagnosis not present

## 2021-02-03 DIAGNOSIS — R82998 Other abnormal findings in urine: Secondary | ICD-10-CM | POA: Diagnosis not present

## 2021-02-03 DIAGNOSIS — R0789 Other chest pain: Secondary | ICD-10-CM

## 2021-02-03 DIAGNOSIS — J4520 Mild intermittent asthma, uncomplicated: Secondary | ICD-10-CM

## 2021-02-03 LAB — CBC WITH DIFFERENTIAL/PLATELET
Basophils Absolute: 0.1 10*3/uL (ref 0.0–0.1)
Basophils Relative: 0.7 % (ref 0.0–3.0)
Eosinophils Absolute: 0.1 10*3/uL (ref 0.0–0.7)
Eosinophils Relative: 0.9 % (ref 0.0–5.0)
HCT: 40.3 % (ref 36.0–46.0)
Hemoglobin: 14.3 g/dL (ref 12.0–15.0)
Lymphocytes Relative: 17.2 % (ref 12.0–46.0)
Lymphs Abs: 1.4 10*3/uL (ref 0.7–4.0)
MCHC: 35.5 g/dL (ref 30.0–36.0)
MCV: 92.1 fl (ref 78.0–100.0)
Monocytes Absolute: 0.6 10*3/uL (ref 0.1–1.0)
Monocytes Relative: 7.2 % (ref 3.0–12.0)
Neutro Abs: 6 10*3/uL (ref 1.4–7.7)
Neutrophils Relative %: 74 % (ref 43.0–77.0)
Platelets: 208 10*3/uL (ref 150.0–400.0)
RBC: 4.38 Mil/uL (ref 3.87–5.11)
RDW: 13.5 % (ref 11.5–15.5)
WBC: 8.1 10*3/uL (ref 4.0–10.5)

## 2021-02-03 LAB — COMPREHENSIVE METABOLIC PANEL
ALT: 17 U/L (ref 0–35)
AST: 22 U/L (ref 0–37)
Albumin: 4.3 g/dL (ref 3.5–5.2)
Alkaline Phosphatase: 104 U/L (ref 39–117)
BUN: 10 mg/dL (ref 6–23)
CO2: 32 mEq/L (ref 19–32)
Calcium: 9.6 mg/dL (ref 8.4–10.5)
Chloride: 100 mEq/L (ref 96–112)
Creatinine, Ser: 0.83 mg/dL (ref 0.40–1.20)
GFR: 80.08 mL/min (ref >60.00)
Glucose, Bld: 87 mg/dL (ref 70–99)
Potassium: 3.5 mEq/L (ref 3.5–5.1)
Sodium: 139 mEq/L (ref 135–145)
Total Bilirubin: 0.7 mg/dL (ref 0.2–1.2)
Total Protein: 7.7 g/dL (ref 6.0–8.3)

## 2021-02-03 LAB — POCT URINALYSIS DIPSTICK
Bilirubin, UA: NEGATIVE
Blood, UA: NEGATIVE
Glucose, UA: NEGATIVE
Ketones, UA: NEGATIVE
Leukocytes, UA: NEGATIVE
Nitrite, UA: NEGATIVE
Protein, UA: POSITIVE — AB
Spec Grav, UA: 1.02 (ref 1.010–1.025)
Urobilinogen, UA: 0.2 E.U./dL
pH, UA: 6 (ref 5.0–8.0)

## 2021-02-03 LAB — URINALYSIS
Bilirubin Urine: NEGATIVE
Hgb urine dipstick: NEGATIVE
Ketones, ur: NEGATIVE
Leukocytes,Ua: NEGATIVE
Nitrite: NEGATIVE
Specific Gravity, Urine: 1.015 (ref 1.000–1.030)
Total Protein, Urine: NEGATIVE
Urine Glucose: NEGATIVE
Urobilinogen, UA: 0.2 (ref 0.0–1.0)
pH: 7 (ref 5.0–8.0)

## 2021-02-03 LAB — TROPONIN I (HIGH SENSITIVITY): High Sens Troponin I: 6 ng/L (ref 2–17)

## 2021-02-03 NOTE — Assessment & Plan Note (Signed)
No signs of CVA

## 2021-02-03 NOTE — Assessment & Plan Note (Signed)
On B12 

## 2021-02-03 NOTE — Addendum Note (Signed)
Addended by: Waldemar Dickens B on: 02/03/2021 09:45 AM   Modules accepted: Orders

## 2021-02-03 NOTE — Assessment & Plan Note (Addendum)
Likely MSK NSAID intolerant Get a CXR Check troponine, D dimer

## 2021-02-03 NOTE — Assessment & Plan Note (Signed)
Cont w/Symbicort 

## 2021-02-03 NOTE — Patient Instructions (Signed)
Go to ER if worse 

## 2021-02-03 NOTE — Progress Notes (Signed)
Subjective:  Patient ID: Kathleen Solis, female    DOB: 1965-10-30  Age: 55 y.o. MRN: [id number]  CC: Back Pain and dark urine (Pt states she just don't feel well)   HPI Lynn Dorough presents for sternal CP 5/10, worse w/pushing on it x 3 days off and on in the middle of the chest. C/o dark urine. C/o feeling weak, tired x 2-3 d C/o pain - top of the mouth hurts Pt is disabled due to h/o CVAs  Outpatient Medications Prior to Visit  Medication Sig Dispense Refill   acetaminophen (TYLENOL) 500 MG tablet Take 500 mg by mouth every 6 (six) hours as needed. Patients states she usually takes 2-4 tabs daily     atorvastatin (LIPITOR) 10 MG tablet Take 1 tablet (10 mg total) by mouth at bedtime. 90 tablet 1   budesonide-formoterol (SYMBICORT) 160-4.5 MCG/ACT inhaler Inhale 2 puffs into the lungs 2 (two) times daily. 1 each 5   hydrochlorothiazide (HYDRODIURIL) 25 MG tablet Take 1 tablet (25 mg total) by mouth daily. 90 tablet 1   metoprolol succinate (TOPROL-XL) 100 MG 24 hr tablet Take 1 tablet (100 mg total) by mouth at bedtime. 90 tablet 1   vitamin B-12 (CYANOCOBALAMIN) 1000 MCG tablet Take 1,000 mcg by mouth daily.     fluticasone (FLONASE) 50 MCG/ACT nasal spray Place 2 sprays into both nostrils daily. (Patient not taking: Reported on 02/03/2021) 16 g 6   potassium chloride (KLOR-CON 10) 10 MEQ tablet Take 1 tablet (10 mEq total) by mouth 2 (two) times daily. (Patient not taking: Reported on 02/03/2021) 60 tablet 11   promethazine-dextromethorphan (PROMETHAZINE-DM) 6.25-15 MG/5ML syrup Take 5 mLs by mouth 4 (four) times daily as needed for cough. (Patient not taking: Reported on 02/03/2021) 473 each 3   No facility-administered medications prior to visit.    ROS: Review of Systems  Constitutional:  Positive for fatigue. Negative for activity change, appetite change, chills, diaphoresis, fever and unexpected weight change.  HENT:  Negative for congestion, dental problem, ear pain,  hearing loss, mouth sores, postnasal drip, sinus pressure, sneezing, sore throat and voice change.   Eyes:  Negative for pain and visual disturbance.  Respiratory:  Negative for cough, chest tightness, wheezing and stridor.   Cardiovascular:  Positive for chest pain. Negative for palpitations and leg swelling.  Gastrointestinal:  Negative for abdominal distention, abdominal pain, blood in stool, nausea, rectal pain and vomiting.  Genitourinary:  Negative for decreased urine volume, difficulty urinating, dysuria, frequency, hematuria, menstrual problem, vaginal bleeding, vaginal discharge and vaginal pain.  Musculoskeletal:  Positive for arthralgias and back pain. Negative for gait problem, joint swelling and neck pain.  Skin:  Negative for color change, rash and wound.  Neurological:  Positive for weakness. Negative for dizziness, tremors, syncope, speech difficulty and light-headedness.  Hematological:  Negative for adenopathy.  Psychiatric/Behavioral:  Negative for behavioral problems, confusion, decreased concentration, dysphoric mood, hallucinations, sleep disturbance and suicidal ideas. The patient is not nervous/anxious and is not hyperactive.    Objective:  BP 112/76 (BP Location: Left Arm)   Pulse (!) 58   Temp 98.2 F (36.8 C) (Oral)   Ht 5' 5.5" (1.664 m)   Wt 199 lb 3.2 oz (90.4 kg)   SpO2 97%   BMI 32.64 kg/m   BP Readings from Last 3 Encounters:  02/03/21 112/76  01/08/21 112/72  12/28/20 134/88    Wt Readings from Last 3 Encounters:  02/03/21 199 lb 3.2 oz (90.4 kg)  01/27/21 204 lb (  92.5 kg)  01/08/21 204 lb 6.4 oz (92.7 kg)    Physical Exam Constitutional:      General: She is not in acute distress.    Appearance: She is well-developed. She is obese.  HENT:     Head: Normocephalic.     Right Ear: External ear normal.     Left Ear: External ear normal.     Nose: Nose normal.  Eyes:     General:        Right eye: No discharge.        Left eye: No  discharge.     Conjunctiva/sclera: Conjunctivae normal.     Pupils: Pupils are equal, round, and reactive to light.  Neck:     Thyroid: No thyromegaly.     Vascular: No JVD.     Trachea: No tracheal deviation.  Cardiovascular:     Rate and Rhythm: Normal rate and regular rhythm.     Heart sounds: Normal heart sounds.  Pulmonary:     Effort: No respiratory distress.     Breath sounds: No stridor. No wheezing.  Abdominal:     General: Bowel sounds are normal. There is no distension.     Palpations: Abdomen is soft. There is no mass.     Tenderness: There is no abdominal tenderness. There is no guarding or rebound.  Musculoskeletal:        General: Tenderness present.     Cervical back: Normal range of motion and neck supple. No rigidity.  Lymphadenopathy:     Cervical: No cervical adenopathy.  Skin:    Findings: No erythema or rash.  Neurological:     Cranial Nerves: No cranial nerve deficit.     Motor: No abnormal muscle tone.     Coordination: Coordination normal.     Deep Tendon Reflexes: Reflexes normal.  Psychiatric:        Behavior: Behavior normal.        Thought Content: Thought content normal.        Judgment: Judgment normal.  Small perumb hernia R costo-vert tenderness (+) Sternum is tender to palpation  Lab Results  Component Value Date   WBC 8.2 05/10/2020   HGB 13.8 05/10/2020   HCT 39.2 05/10/2020   PLT 261 05/10/2020   GLUCOSE 75 12/25/2020   CHOL 133 11/03/2020   TRIG 82.0 11/03/2020   HDL 57.30 11/03/2020   LDLCALC 60 11/03/2020   ALT 15 11/03/2020   AST 19 11/03/2020   NA 142 12/25/2020   K 3.6 12/25/2020   CL 100 12/25/2020   CREATININE 0.77 12/25/2020   BUN 10 12/25/2020   CO2 27 12/25/2020   TSH 0.88 11/03/2020   INR 1.18 11/24/2015   HGBA1C 5.1 11/03/2020    No results found.  Assessment & Plan:     Sonda Primes, MD

## 2021-02-04 ENCOUNTER — Encounter: Payer: Medicare PPO | Admitting: Rehabilitative and Restorative Service Providers"

## 2021-02-04 ENCOUNTER — Telehealth: Payer: Self-pay | Admitting: Internal Medicine

## 2021-02-04 LAB — D-DIMER, QUANTITATIVE: D-Dimer, Quant: <0.19 mcg/mL FEU (ref <0.50)

## 2021-02-04 NOTE — Telephone Encounter (Signed)
Lower lungs clipped on frontal view bilaterally. Please fix and resend.   Please call once it is resent  Callback #: [phone]

## 2021-02-07 NOTE — Progress Notes (Signed)
Cardiology Office Note:    Date:  02/08/2021   ID:  Kathleen Solis, DOB [birth year], MRN [id number]  PCP:  Pincus Sanes, MD Referring MD: Pincus Sanes, MD    [facility]  Cardiologist:  Parke Poisson, MD   Reason for visit: Follow-up post stress test  History of Present Illness:    Kathleen Solis is a 55 y.o. female with a hx of hypertension, hyperlipidemia,tension type headache and history of stroke.  She last saw Dr. Jacques Navy on 01/08/21 with concerns of DOE and fatigue.  Unable to do coronary CTA due to difficult IV access.  She underwent a Lexiscan stress test on 01/27/2021 which was negative for ischemia.  She presents today for follow-up after stress test.  Today, she is accompanied by her husband and her son.  She states she has had chest pain for a couple weeks (though our first CP note is on 11/24/20).  She describes it as sharp and pressure in the middle of her chest, sometimes radiates to her left arm.  Episodes increasing in frequency.  Episodes can occur at any time, not associated with exertion, eating, deep inspiration, movement or palpation.  Nothing particularly triggers, worsens or makes it better.  Episodes last 15-30 minutes.  She typically sits when episodes occur.  She does have hx of GERD but states medications have not helped much in the past.  I asked if she has tried a PPI x 1 month regularly and she denies taking it that long.  She has occasional N/V but thinks this has improved.  She does have asthma and "coughs a lot" but denies DOE.  She mentions she has stopped taking aspirin despite neurology's recommendations.   Past Medical History:  Diagnosis Date   Allergic rhinitis 08/16/2016   Benign essential hypertension 03/03/2015   Bone spur of right foot 02/08/2016   Bunion of left foot 02/08/2016   Carotid arterial disease 04/10/2016   03/2016: right 40-59%, left < 40%    Chest pain 02/14/2017   Cyst of left ovary 03/03/2015    Diverticulitis of colon without hemorrhage 02/25/2016   Epigastric pain 05/06/2020   Episodic tension-type headache 01/12/2016   Taking motrin 200 mg bid   GERD (gastroesophageal reflux disease)    History of cerebrovascular accident (CVA) with residual deficit 2001   Continue aspirin 81 mg daily, statin and current blood pressure medications; Blood pressure has been well controlled and cholesterol has been at goal   Hyperglycemia 02/14/2017   Chronic; encouraged low sugar/carbohydrate diet and regular exercise   Hyperlipidemia    diet controlled - no med   Leg neuralgia, right    Related to CVA    Lumbar back pain 05/10/2018   Lumbar radiculopathy 05/15/2018   Pain seems consistent with radiculopathy   MCI (mild cognitive impairment) with memory loss 12/31/2020   Midline cystocele    Mild asthma 08/27/2020   Onychomycosis 02/21/2018   Toenails; Ciclopirox prescribed   Sciatica    right   Sinus infection 03/22/2019   SVD (spontaneous vaginal delivery)    x 2   Tinnitus of right ear 11/16/2016   Vitamin B12 deficiency 02/23/2018    Past Surgical History:  Procedure Laterality Date   ABDOMINAL HYSTERECTOMY     partial   ANAL FISSURE REPAIR     APPENDECTOMY     CHOLECYSTECTOMY     CYSTOSCOPY N/A 10/04/2017   Procedure: CYSTOSCOPY;  Surgeon: Gerald Leitz, MD;  Location: [facility];  Service: Gynecology;  Laterality: N/A;   FOOT SURGERY Left    big toe bunion removed   LAPAROSCOPIC BILATERAL SALPINGO OOPHERECTOMY Bilateral 10/04/2017   Procedure: LAPAROSCOPIC BILATERAL SALPINGO OOPHORECTOMY, PELVIC WASHINGS;  Surgeon: Gerald Leitz, MD;  Location: [facility];  Service: Gynecology;  Laterality: Bilateral;   TUBAL LIGATION     WISDOM TOOTH EXTRACTION      Current Medications: Current Meds  Medication Sig   acetaminophen (TYLENOL) 500 MG tablet Take 500 mg by mouth every 6 (six) hours as needed. Patients states she usually takes 2-4 tabs daily   aspirin EC 81 MG tablet Take 1 tablet (81 mg total) by  mouth daily. Swallow whole.   atorvastatin (LIPITOR) 10 MG tablet Take 1 tablet (10 mg total) by mouth at bedtime.   budesonide-formoterol (SYMBICORT) 160-4.5 MCG/ACT inhaler Inhale 2 puffs into the lungs 2 (two) times daily.   fluticasone (FLONASE) 50 MCG/ACT nasal spray Place 2 sprays into both nostrils daily.   hydrochlorothiazide (HYDRODIURIL) 25 MG tablet Take 1 tablet (25 mg total) by mouth daily.   metoprolol succinate (TOPROL-XL) 100 MG 24 hr tablet Take 1 tablet (100 mg total) by mouth at bedtime.   pantoprazole (PROTONIX) 20 MG tablet Take 1 tablet (20 mg total) by mouth daily.   promethazine (PHENERGAN) 6.25 MG/5ML syrup Take by mouth 2 (two) times daily before a meal.   vitamin B-12 (CYANOCOBALAMIN) 1000 MCG tablet Take 1,000 mcg by mouth daily.     Allergies:   Benzonatate, Darvon [propoxyphene], Ibuprofen, Morphine and related, Prednisone, Sulfa antibiotics, Tussionex pennkinetic er [employer] polst-cpm polst er], Buprenorphine hcl, Ivp dye [iodinated diagnostic agents], and Prochlorperazine edisylate   Social History   Socioeconomic History   Marital status: Married    Spouse name: Not on file   Number of children: 2   Years of education: 14   Highest education level: Associate degree: academic program  Occupational History   Occupation: Disability  Tobacco Use   Smoking status: Never   Smokeless tobacco: Never  Vaping Use   Vaping Use: Never used  Substance and Sexual Activity   Alcohol use: No    Alcohol/week: 0.0 standard drinks   Drug use: No   Sexual activity: Not on file  Other Topics Concern   Not on file  Social History Narrative   No regular exercise      Pt is right handed, she occasionally drinks tea, walks QOD. She lives with her 60 yr old son, he has mild cerebral palsy.   Social Determinants of Health   Financial Resource Strain: Not on file  Food Insecurity: Not on file  Transportation Needs: Not on file  Physical Activity: Not on file   Stress: Not on file  Social Connections: Not on file     Family History: The patient's family history includes Anuerysm in her cousin; Anuerysm (age of onset: 62) in her mother; Breast cancer in her cousin, niece, and sister; Cataracts in her sister and sister; Cerebral palsy in her son; Developmental delay in her nephew; Diabetes in her sister; Esophageal cancer in her nephew; Glaucoma in her sister; Hypertension in her sister; Pancreatic cancer in her father; Retinal detachment in her brother. There is no history of Colon polyps, Prostate cancer, Rectal cancer, Stomach cancer, or Uterine cancer.  ROS:   Please see the history of present illness.     EKGs/Labs/Other Studies Reviewed:    EKG:  The ekg ordered today demonstrates NSR, rate 66, nonspecific T wave abnormality, no change  from 11/24/20.    Recent Labs: 11/03/2020: TSH 0.88 02/03/2021: ALT 17; BUN 10; Creatinine, Ser 0.83; Hemoglobin 14.3; Platelets 208.0; Potassium 3.5; Sodium 139  Recent Lipid Panel    Component Value Date/Time   CHOL 133 11/03/2020 1009   CHOL 200 (H) 12/26/2017 1421   TRIG 82.0 11/03/2020 1009   HDL 57.30 11/03/2020 1009   HDL 53 12/26/2017 1421   CHOLHDL 2 11/03/2020 1009   VLDL 16.4 11/03/2020 1009   LDLCALC 60 11/03/2020 1009   LDLCALC 122 (H) 12/26/2017 1421    Physical Exam:    VS:  BP 126/80 (BP Location: Left Arm, Patient Position: Sitting, Cuff Size: Normal)   Pulse 66   Resp 18   Ht 5\' 5"  (1.651 m)   Wt 201 lb (91.2 kg)   SpO2 97%   BMI 33.45 kg/m     Wt Readings from Last 3 Encounters:  02/08/21 201 lb (91.2 kg)  02/03/21 199 lb 3.2 oz (90.4 kg)  01/27/21 204 lb (92.5 kg)     GEN:  Well nourished, well developed in no acute distress, overweight HEENT: Normal NECK: No JVD; No carotid bruits CARDIAC: RRR, no murmurs, rubs, gallops RESPIRATORY:  Clear to auscultation without rales, wheezing or rhonchi  ABDOMEN: Soft, non-tender, non-distended MUSCULOSKELETAL: No edema; No  deformity  SKIN: Warm and dry NEUROLOGIC:  Alert and oriented PSYCHIATRIC:  Normal affect   ASSESSMENT AND PLAN   Precordial chest pain - No ischemia on Lexiscan stress test 01/27/2021 - 2D echo with normal EF, mild MR on 01/04/2021 - CV risk factors - lipids/BP well controlled, not DM.  Would benefit from weight loss.  (She has been on disability since 1990s 2/2 hx CVA.) --> Patient does not have exertional CP, symptoms are difficult to decipher.  She had a EGD 06/2020 showing esophagitis,  gastropathy and duodenitis.  GI had recommended pantoprazole 40 mg BID and avoid all NSAIDs.  Pt states GERD medications did not help and she did not continue these.  She had PFTs in 05/2020 which showed normal function.   --> I recommend taking PPI regularly x 1 month to see if improvement in symptoms.   --> I discussed trialing Imdur vs. LHC for definitive investigation into symptoms.  I also mentioned Ziopatch but she denies significant palpitations.  Pt wishes to hold off these options.  She wishes to go back on aspirin 81mg  daily and trial of PPI.   --> I advised they can call me anytime & we can order any of the above.  Otherwise I will see her back in 6 weeks.  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   - Carotid duplex 10/2020 with no stenosis  Mixed hyperlipidemia, well controlled - Lipids are well controlled on atorvastatin 10 mg daily  Mild MR - Plan repeat echo in 01/2023  Suspected OSA - Pt mentions apneic episodes.  Her neurology has discussed ordering sleep study.  I encouraged pt to follow-up on this.      Medication Adjustments/Labs and Tests Ordered: Current medicines are reviewed at length with the patient today.  Concerns regarding medicines are outlined above.  Orders Placed This Encounter  Procedures   EKG 12-Lead    Meds ordered this encounter  Medications   aspirin EC 81 MG tablet    Sig: Take 1 tablet (81 mg total) by mouth  daily. Swallow whole.    Dispense:  90 tablet    Refill:  3   pantoprazole (PROTONIX) 20 MG tablet    Sig: Take 1 tablet (20 mg total) by mouth daily.      Signed, Cannon Kettle, PA-C  02/08/2021 11:51 AM    [facility]

## 2021-02-08 ENCOUNTER — Ambulatory Visit: Payer: Medicare PPO | Admitting: Physician Assistant

## 2021-02-08 ENCOUNTER — Other Ambulatory Visit: Payer: Self-pay

## 2021-02-08 ENCOUNTER — Encounter: Payer: Self-pay | Admitting: General Practice

## 2021-02-08 VITALS — BP 126/80 | HR 66 | Resp 18 | Ht 65.0 in | Wt 201.0 lb

## 2021-02-08 DIAGNOSIS — R0600 Dyspnea, unspecified: Secondary | ICD-10-CM

## 2021-02-08 DIAGNOSIS — E782 Mixed hyperlipidemia: Secondary | ICD-10-CM | POA: Diagnosis not present

## 2021-02-08 DIAGNOSIS — R072 Precordial pain: Secondary | ICD-10-CM

## 2021-02-08 DIAGNOSIS — I10 Essential (primary) hypertension: Secondary | ICD-10-CM | POA: Diagnosis not present

## 2021-02-08 DIAGNOSIS — R0609 Other forms of dyspnea: Secondary | ICD-10-CM

## 2021-02-08 MED ORDER — PANTOPRAZOLE SODIUM 20 MG PO TBEC: 20.0000 mg | DELAYED_RELEASE_TABLET | Freq: Every day | ORAL | Status: DC

## 2021-02-08 MED ORDER — ASPIRIN EC 81 MG PO TBEC: 81.0000 mg | DELAYED_RELEASE_TABLET | Freq: Every day | ORAL | 3 refills | Status: DC

## 2021-02-08 NOTE — Patient Instructions (Signed)
Medication Instructions:  Take aspirin 81mg  daily  TAKE PROTONIX 20MG  DAILY FOR 1 MONTH-CALL IF SYMPTOMS WORSEN/PERSIST, TO DISCUSS MEDICATION CHANGE OR CATH PROCEDURE. *If you need a refill on your cardiac medications before your next appointment, please call your pharmacy*  Lab Work:   Testing/Procedures:  NONE    NONE  Follow-Up: Your next appointment:  6 week(s) In Person with , MD OR IF UNAVAILABLE Falcon Mccaskey, PA-C.  Please call our office 2 months in advance to schedule this appointment   At [facility], you and your health needs are our priority.  As part of our continuing mission to provide you with exceptional heart care, we have created designated Provider Care Teams.  These Care Teams include your primary Cardiologist (physician) and Advanced Practice Providers (APPs -  Physician Assistants and Nurse Practitioners) who all work together to provide you with the care you need, when you need it.

## 2021-02-09 ENCOUNTER — Encounter: Payer: Medicare PPO | Admitting: Physical Therapy

## 2021-02-10 NOTE — Progress Notes (Signed)
Subjective:    Patient ID: Kathleen Solis, female    DOB: [birth year], 54 y.o.   MRN: [id number]  HPI The patient is here for follow up from one week ago - saw Dr Posey Rea for sternal CP, dark urine, weak, tired, pain roof of mouth.   CP - likely msk  Cbc, cmp normal.  D-dimer neg.  UA neg.  CXR - normal.   Has been having chest pain for a while - saw cardiology and w/u neg for ischemia.  Had gerd but not taking PPI - cardiology advised taking PPI daily x 1 month and restart ASA.   She still has the chest pressure - substernal.  Nothing makes it worse ( coughing, eating, movement).  Nothing makes it better.    It just comes.   Dry cough - nothing has helped - has seen pulm and allergy.  No allergy medication or inhalers have helped.  She has not followed up with them - immunotherapy may be next.    She has dificulty swallowing at times - can occur with water or food.    Reviewed recent labs.   Reviewed last cardiology note, pulmonary note and allergy note  Medications and allergies reviewed with patient and updated if appropriate.  Patient Active Problem List   Diagnosis Date Noted   Urinary hesitancy 12/31/2020   MCI (mild cognitive impairment) with memory loss 12/31/2020   Midline cystocele    GERD (gastroesophageal reflux disease)    Mild asthma 08/27/2020   Epigastric pain 05/06/2020   Lumbar radiculopathy 05/15/2018   Lumbar back pain 05/10/2018   Vitamin B12 deficiency 02/23/2018   Tingling 02/21/2018   Onychomycosis 02/21/2018   Hyperlipidemia 02/14/2017   Chest pain 02/14/2017   Hyperglycemia 02/14/2017   Tinnitus of right ear 11/16/2016   Constipation 09/13/2016   Allergic rhinitis 08/16/2016   Carotid arterial disease 04/10/2016   Diverticulitis of colon without hemorrhage 02/25/2016   Bone spur of right foot 02/08/2016   History of cerebrovascular accident (CVA) with residual deficit    Leg neuralgia, right    Episodic tension-type headache 01/12/2016    Benign essential hypertension 03/03/2015   Cyst of left ovary 03/03/2015    Current Outpatient Medications on File Prior to Visit  Medication Sig Dispense Refill   acetaminophen (TYLENOL) 500 MG tablet Take 500 mg by mouth every 6 (six) hours as needed. Patients states she usually takes 2-4 tabs daily     aspirin EC 81 MG tablet Take 1 tablet (81 mg total) by mouth daily. Swallow whole. 90 tablet 3   atorvastatin (LIPITOR) 10 MG tablet Take 1 tablet (10 mg total) by mouth at bedtime. 90 tablet 1   budesonide-formoterol (SYMBICORT) 160-4.5 MCG/ACT inhaler Inhale 2 puffs into the lungs 2 (two) times daily. 1 each 5   fluticasone (FLONASE) 50 MCG/ACT nasal spray Place 2 sprays into both nostrils daily. 16 g 6   hydrochlorothiazide (HYDRODIURIL) 25 MG tablet Take 1 tablet (25 mg total) by mouth daily. 90 tablet 1   metoprolol succinate (TOPROL-XL) 100 MG 24 hr tablet Take 1 tablet (100 mg total) by mouth at bedtime. 90 tablet 1   pantoprazole (PROTONIX) 20 MG tablet Take 1 tablet (20 mg total) by mouth daily.     promethazine (PHENERGAN) 6.25 MG/5ML syrup Take by mouth 2 (two) times daily before a meal.     vitamin B-12 (CYANOCOBALAMIN) 1000 MCG tablet Take 1,000 mcg by mouth daily.     No current facility-administered medications  on file prior to visit.    Past Medical History:  Diagnosis Date   Allergic rhinitis 08/16/2016   Benign essential hypertension 03/03/2015   Bone spur of right foot 02/08/2016   Bunion of left foot 02/08/2016   Carotid arterial disease 04/10/2016   03/2016: right 40-59%, left < 40%    Chest pain 02/14/2017   Cyst of left ovary 03/03/2015   Diverticulitis of colon without hemorrhage 02/25/2016   Epigastric pain 05/06/2020   Episodic tension-type headache 01/12/2016   Taking motrin 200 mg bid   GERD (gastroesophageal reflux disease)    History of cerebrovascular accident (CVA) with residual deficit 2001   Continue aspirin 81 mg daily, statin and current blood pressure  medications; Blood pressure has been well controlled and cholesterol has been at goal   Hyperglycemia 02/14/2017   Chronic; encouraged low sugar/carbohydrate diet and regular exercise   Hyperlipidemia    diet controlled - no med   Leg neuralgia, right    Related to CVA    Lumbar back pain 05/10/2018   Lumbar radiculopathy 05/15/2018   Pain seems consistent with radiculopathy   MCI (mild cognitive impairment) with memory loss 12/31/2020   Midline cystocele    Mild asthma 08/27/2020   Onychomycosis 02/21/2018   Toenails; Ciclopirox prescribed   Sciatica    right   Sinus infection 03/22/2019   SVD (spontaneous vaginal delivery)    x 2   Tinnitus of right ear 11/16/2016   Vitamin B12 deficiency 02/23/2018    Past Surgical History:  Procedure Laterality Date   ABDOMINAL HYSTERECTOMY     partial   ANAL FISSURE REPAIR     APPENDECTOMY     CHOLECYSTECTOMY     CYSTOSCOPY N/A 10/04/2017   Procedure: CYSTOSCOPY;  Surgeon: Gerald Leitz, MD;  Location: [facility];  Service: Gynecology;  Laterality: N/A;   FOOT SURGERY Left    big toe bunion removed   LAPAROSCOPIC BILATERAL SALPINGO OOPHERECTOMY Bilateral 10/04/2017   Procedure: LAPAROSCOPIC BILATERAL SALPINGO OOPHORECTOMY, PELVIC WASHINGS;  Surgeon: Gerald Leitz, MD;  Location: [facility];  Service: Gynecology;  Laterality: Bilateral;   TUBAL LIGATION     WISDOM TOOTH EXTRACTION      Social History   Socioeconomic History   Marital status: Married    Spouse name: Not on file   Number of children: 2   Years of education: 14   Highest education level: Associate degree: academic program  Occupational History   Occupation: Disability  Tobacco Use   Smoking status: Never   Smokeless tobacco: Never  Vaping Use   Vaping Use: Never used  Substance and Sexual Activity   Alcohol use: No    Alcohol/week: 0.0 standard drinks   Drug use: No   Sexual activity: Not on file  Other Topics Concern   Not on file  Social History Narrative   No regular  exercise      Pt is right handed, she occasionally drinks tea, walks QOD. She lives with her 64 yr old son, he has mild cerebral palsy.   Social Determinants of Health   Financial Resource Strain: Not on file  Food Insecurity: Not on file  Transportation Needs: Not on file  Physical Activity: Not on file  Stress: Not on file  Social Connections: Not on file    Family History  Problem Relation Age of Onset   Breast cancer Sister    Diabetes Sister    Glaucoma Sister    Cataracts Sister    Hypertension  Sister    Anuerysm Mother 7   Pancreatic cancer Father    Retinal detachment Brother    Cataracts Sister    Breast cancer Cousin    Anuerysm Cousin    Breast cancer Niece    Esophageal cancer Nephew    Developmental delay Nephew    Cerebral palsy Son        mild   Colon polyps Neg Hx    Prostate cancer Neg Hx    Rectal cancer Neg Hx    Stomach cancer Neg Hx    Uterine cancer Neg Hx     Review of Systems  Constitutional:  Negative for chills and fever.  Respiratory:  Positive for cough (dry chronic). Negative for chest tightness, shortness of breath and wheezing.   Cardiovascular:  Positive for chest pain. Negative for palpitations and leg swelling.  Genitourinary:  Negative for dysuria and hematuria.       No dark urine  Neurological:  Negative for light-headedness and headaches.      Objective:   Vitals:   02/11/21 0847  BP: 120/84  Pulse: 69  Temp: 98.6 F (37 C)   BP Readings from Last 3 Encounters:  02/11/21 120/84  02/08/21 126/80  02/03/21 112/76   Wt Readings from Last 3 Encounters:  02/11/21 201 lb (91.2 kg)  02/08/21 201 lb (91.2 kg)  02/03/21 199 lb 3.2 oz (90.4 kg)   Body mass index is 33.45 kg/m.   Physical Exam    Constitutional: Appears well-developed and well-nourished. No distress.  HENT:  Head: Normocephalic and atraumatic.  Neck: Neck supple. No tracheal deviation present. No thyromegaly present.  No cervical  lymphadenopathy Cardiovascular: Normal rate, regular rhythm and normal heart sounds.   No murmur heard. No carotid bruit .  No edema Pulmonary/Chest: Effort normal and breath sounds normal. No respiratory distress. No has no wheezes. No rales.  Skin: Skin is warm and dry. Not diaphoretic.  Psychiatric: Normal mood and affect. Behavior is normal.   DG Chest 2 View CLINICAL DATA:  Chest pain  EXAM: CHEST - 2 VIEW  COMPARISON:  05/10/2020  FINDINGS: The heart size and mediastinal contours are within normal limits. Both lungs are clear. The visualized skeletal structures are unremarkable.  IMPRESSION: No active cardiopulmonary disease.  Electronically Signed   By: Jasmine Pang M.D.   On: 02/04/2021 19:18  Lab Results  Component Value Date   WBC 8.1 02/03/2021   HGB 14.3 02/03/2021   HCT 40.3 02/03/2021   PLT 208.0 02/03/2021   GLUCOSE 87 02/03/2021   CHOL 133 11/03/2020   TRIG 82.0 11/03/2020   HDL 57.30 11/03/2020   LDLCALC 60 11/03/2020   ALT 17 02/03/2021   AST 22 02/03/2021   NA 139 02/03/2021   K 3.5 02/03/2021   CL 100 02/03/2021   CREATININE 0.83 02/03/2021   BUN 10 02/03/2021   CO2 32 02/03/2021   TSH 0.88 11/03/2020   INR 1.18 11/24/2015   HGBA1C 5.1 11/03/2020      Assessment & Plan:    See Problem List for Assessment and Plan of chronic medical problems.    This visit occurred during the SARS-CoV-2 public health emergency.  Safety protocols were in place, including screening questions prior to the visit, additional usage of staff PPE, and extensive cleaning of exam room while observing appropriate contact time as indicated for disinfecting solutions.

## 2021-02-11 ENCOUNTER — Other Ambulatory Visit: Payer: Self-pay

## 2021-02-11 ENCOUNTER — Encounter: Payer: Self-pay | Admitting: Internal Medicine

## 2021-02-11 ENCOUNTER — Encounter: Payer: Medicare PPO | Admitting: Rehabilitative and Restorative Service Providers"

## 2021-02-11 ENCOUNTER — Ambulatory Visit: Payer: Medicare PPO | Admitting: Internal Medicine

## 2021-02-11 VITALS — BP 120/84 | HR 69 | Temp 98.6°F | Ht 65.0 in | Wt 201.0 lb

## 2021-02-11 DIAGNOSIS — R053 Chronic cough: Secondary | ICD-10-CM | POA: Diagnosis not present

## 2021-02-11 DIAGNOSIS — R1312 Dysphagia, oropharyngeal phase: Secondary | ICD-10-CM

## 2021-02-11 NOTE — Assessment & Plan Note (Signed)
Chronic Intermittent With liquids and food - sounds oropharyngeal Will refer for modified barium swallow EGD done last year

## 2021-02-11 NOTE — Assessment & Plan Note (Signed)
Chronic Dry ? Combination of allergies, asthma, ? gerd She restarted the PPI - advised to take daily  Advised f/u with Dr Delorse Lek - consider immunotherapy

## 2021-02-11 NOTE — Patient Instructions (Addendum)
  Follow up with Dr Delorse Lek.    A swallowing evaluation test was ordered  - they will call you to schedule this.

## 2021-02-12 ENCOUNTER — Other Ambulatory Visit (HOSPITAL_COMMUNITY): Payer: Self-pay

## 2021-02-12 DIAGNOSIS — R1310 Dysphagia, unspecified: Secondary | ICD-10-CM

## 2021-02-16 ENCOUNTER — Encounter: Payer: Medicare PPO | Admitting: Physical Therapy

## 2021-02-17 NOTE — Telephone Encounter (Signed)
Error

## 2021-02-18 ENCOUNTER — Encounter: Payer: Medicare PPO | Admitting: Rehabilitative and Restorative Service Providers"

## 2021-02-23 ENCOUNTER — Ambulatory Visit (HOSPITAL_COMMUNITY)
Admission: RE | Admit: 2021-02-23 | Discharge: 2021-02-23 | Disposition: A | Discharge status: Home / Self Care | Payer: Medicare PPO | Source: Ambulatory Visit | Attending: Internal Medicine | Admitting: Internal Medicine

## 2021-02-23 ENCOUNTER — Other Ambulatory Visit: Payer: Self-pay

## 2021-02-23 ENCOUNTER — Ambulatory Visit (HOSPITAL_COMMUNITY): Payer: Medicare PPO

## 2021-02-23 DIAGNOSIS — R1310 Dysphagia, unspecified: Secondary | ICD-10-CM | POA: Diagnosis not present

## 2021-02-23 DIAGNOSIS — R1312 Dysphagia, oropharyngeal phase: Secondary | ICD-10-CM | POA: Insufficient documentation

## 2021-02-24 ENCOUNTER — Encounter: Payer: Medicare PPO | Admitting: Physical Therapy

## 2021-02-26 ENCOUNTER — Encounter: Payer: Medicare PPO | Admitting: Rehabilitative and Restorative Service Providers"

## 2021-03-02 ENCOUNTER — Encounter: Payer: Medicare PPO | Admitting: Physical Therapy

## 2021-03-03 ENCOUNTER — Telehealth: Payer: Self-pay | Admitting: Emergency Medicine

## 2021-03-03 NOTE — Telephone Encounter (Signed)
Left message for patient to call back  

## 2021-03-04 ENCOUNTER — Encounter: Payer: Medicare PPO | Admitting: Rehabilitative and Restorative Service Providers"

## 2021-03-04 NOTE — Telephone Encounter (Signed)
Called and spoke to pt. Pt requesting refill of Promethazine DM.  Last filled on 08/27/2020 for with 3 refills.  Last seen 08/27/2020 by RB and advised to follow up in 6 months.  Pt has an upcoming appt with Dr. Delton Coombes for 03/23/21.    Dr. Delton Coombes, please advise. Thanks.

## 2021-03-05 ENCOUNTER — Telehealth: Payer: Self-pay

## 2021-03-05 NOTE — Telephone Encounter (Signed)
Spoke with patient today.  She will visit Urgent Care in the morning and follow up with Dr. Lawerance Bach if needed.

## 2021-03-05 NOTE — Telephone Encounter (Signed)
-----   Message from Don Broach sent at 03/05/2021  3:47 PM EDT ----- [facility] FYI- urgent

## 2021-03-06 DIAGNOSIS — J069 Acute upper respiratory infection, unspecified: Secondary | ICD-10-CM | POA: Diagnosis not present

## 2021-03-06 DIAGNOSIS — L03211 Cellulitis of face: Secondary | ICD-10-CM | POA: Diagnosis not present

## 2021-03-08 MED ORDER — PROMETHAZINE-DM 6.25-15 MG/5ML PO SYRP
5.0000 mL | ORAL_SOLUTION | Freq: Four times a day (QID) | ORAL | 0 refills | Status: DC | PRN
Start: 2021-03-08 — End: 2021-04-21

## 2021-03-08 NOTE — Telephone Encounter (Signed)
Ok to refill phenergan / DM syrup prn for cough, although the only one I see on her med list is phenergan for nausea.

## 2021-03-08 NOTE — Telephone Encounter (Signed)
Phenergan DM is under med list history.  Called and spoke with Patient.  Patient requested [facility].  Phenergan DM refill sent to requested pharmacy. Nothing further at this time.

## 2021-03-09 DIAGNOSIS — L03211 Cellulitis of face: Secondary | ICD-10-CM | POA: Diagnosis not present

## 2021-03-17 DIAGNOSIS — R52 Pain, unspecified: Secondary | ICD-10-CM | POA: Diagnosis not present

## 2021-03-17 DIAGNOSIS — R051 Acute cough: Secondary | ICD-10-CM | POA: Diagnosis not present

## 2021-03-17 DIAGNOSIS — N398 Other specified disorders of urinary system: Secondary | ICD-10-CM | POA: Diagnosis not present

## 2021-03-17 DIAGNOSIS — N393 Stress incontinence (female) (male): Secondary | ICD-10-CM | POA: Diagnosis not present

## 2021-03-17 DIAGNOSIS — R519 Headache, unspecified: Secondary | ICD-10-CM | POA: Diagnosis not present

## 2021-03-17 DIAGNOSIS — R059 Cough, unspecified: Secondary | ICD-10-CM | POA: Diagnosis not present

## 2021-03-17 DIAGNOSIS — Z03818 Encounter for observation for suspected exposure to other biological agents ruled out: Secondary | ICD-10-CM | POA: Diagnosis not present

## 2021-03-23 ENCOUNTER — Ambulatory Visit: Payer: Medicare PPO | Admitting: Emergency Medicine

## 2021-03-24 ENCOUNTER — Ambulatory Visit: Payer: Medicare PPO | Admitting: Physician Assistant

## 2021-04-16 DIAGNOSIS — R519 Headache, unspecified: Secondary | ICD-10-CM | POA: Diagnosis not present

## 2021-04-16 DIAGNOSIS — J069 Acute upper respiratory infection, unspecified: Secondary | ICD-10-CM | POA: Diagnosis not present

## 2021-04-16 DIAGNOSIS — Z03818 Encounter for observation for suspected exposure to other biological agents ruled out: Secondary | ICD-10-CM | POA: Diagnosis not present

## 2021-04-16 DIAGNOSIS — R110 Nausea: Secondary | ICD-10-CM | POA: Diagnosis not present

## 2021-04-20 ENCOUNTER — Telehealth: Payer: Self-pay | Admitting: Emergency Medicine

## 2021-04-20 NOTE — Telephone Encounter (Addendum)
Spoke to patient, who is questioning if 04/21/2021 visit can by telephone due to sore throat/cough/nausea.  Dr. Delton Coombes, please advise. Thanks

## 2021-04-20 NOTE — Telephone Encounter (Signed)
Call made to patient, confirmed DOB. Made aware RB is okay with visit being virtual. Patient is on mychart. Appt converted.   Nothing further needed at this time.

## 2021-04-20 NOTE — Telephone Encounter (Signed)
Yes it can.

## 2021-04-21 ENCOUNTER — Encounter: Payer: Self-pay | Admitting: Emergency Medicine

## 2021-04-21 ENCOUNTER — Telehealth: Payer: Self-pay | Admitting: Emergency Medicine

## 2021-04-21 ENCOUNTER — Telehealth (INDEPENDENT_AMBULATORY_CARE_PROVIDER_SITE_OTHER): Payer: Medicare PPO | Admitting: Emergency Medicine

## 2021-04-21 DIAGNOSIS — R053 Chronic cough: Secondary | ICD-10-CM | POA: Diagnosis not present

## 2021-04-21 MED ORDER — FLUTICASONE PROPIONATE 50 MCG/ACT NA SUSP: 2.0000 | Freq: Every day | NASAL | 6 refills | Status: DC

## 2021-04-21 MED ORDER — PROMETHAZINE-DM 6.25-15 MG/5ML PO SYRP
5.0000 mL | ORAL_SOLUTION | Freq: Four times a day (QID) | ORAL | 0 refills | Status: DC | PRN
Start: 2021-04-21 — End: 2021-05-05

## 2021-04-21 MED ORDER — PANTOPRAZOLE SODIUM 20 MG PO TBEC: 40.0000 mg | DELAYED_RELEASE_TABLET | Freq: Two times a day (BID) | ORAL | Status: DC

## 2021-04-21 NOTE — Addendum Note (Signed)
Addended by: Dorisann Frames R on: 04/21/2021 11:12 AM   Modules accepted: Orders

## 2021-04-21 NOTE — Progress Notes (Signed)
Virtual Visit via Video Note  I connected with Kathleen Solis on 04/21/21 at 10:15 AM EDT by a video enabled telemedicine application and verified that I am speaking with the correct person using two identifiers.  Location: Patient: Home Provider: Office   I discussed the limitations of evaluation and management by telemedicine and the availability of in person appointments. The patient expressed understanding and agreed to proceed.  History of Present Illness: 55 year old woman, never smoker with a history of mild asthma (positive bronchodilator response), allergic rhinitis, chronic cough.  She has positive skin testing, has seen allergy. Has not been started on immunotherapy to date.     Observations/Objective: She is not currently on flonase prn She is on omeprazole 40mg  bid She is reliable with Symbicort bid - unsure whether it is helping her. She never wheezes, never uses albuterol.  Promethazine DM helps, needs to be refilled.  She sees allergy again in September  She is coughing "all the time". Happens all day. No clear triggers. Non-productive. Not feeling any GERD. Still w some PND, no more than before. She feels congested in her chest. Intermittently loses her voice.  Over the weekend she had myalgias, throat pain, increased mucous. COVID tested negative.   CBC Latest Ref Rng & Units 02/03/2021 05/10/2020 05/06/2020  WBC 4.0 - 10.5 K/uL 8.1 8.2 7.1  Hemoglobin 12.0 - 15.0 g/dL 05/08/2020 62.8 31.5  Hematocrit 36.0 - 46.0 % 40.3 39.2 40.9  Platelets 150.0 - 400.0 K/uL 208.0 261 256    Eosinophil 0.9% (0.1)   Assessment and Plan: -wants avoid prednisone -temporarily stop symbicort -albuterol prn -change to pantoprazole 40mg  bid -restart flonase every day > refill -may need to consider FOB if sx persist after these interventions -follow w allergy next month as planned.  -refill the cough syrup  Follow Up Instructions: 1 month or Next Available   I discussed the assessment  and treatment plan with the patient. The patient was provided an opportunity to ask questions and all were answered. The patient agreed with the plan and demonstrated an understanding of the instructions.   The patient was advised to call back or seek an in-person evaluation if the symptoms worsen or if the condition fails to improve as anticipated.  I provided 22 minutes of non-face-to-face time during this encounter.   17.6, MD

## 2021-04-21 NOTE — Telephone Encounter (Signed)
Spoke with Marchelle Folks. She stated that the patient just needed to be scheduled for her follow up visit.   Called and spoke with patient. I was able to get her scheduled with RB on 05/21/21 at 915am. She verbalized understanding.   Nothing further needed at time of call.

## 2021-04-24 DIAGNOSIS — H66003 Acute suppurative otitis media without spontaneous rupture of ear drum, bilateral: Secondary | ICD-10-CM | POA: Diagnosis not present

## 2021-04-30 ENCOUNTER — Telehealth: Payer: Self-pay | Admitting: Internal Medicine

## 2021-04-30 NOTE — Telephone Encounter (Signed)
Pt called to change her 05/03/21 appt with Dr. Jacques Navy.. she is being treated for bilateral ear infections and a sinus infection and she sounded very hoarse on the phone.. no COVID but she does not feel well... we changed her appt to 05/10/21.

## 2021-04-30 NOTE — Telephone Encounter (Signed)
Patient called to see if her appt on Monday can be change to a virtual appt because she is not feeling well. Please advise

## 2021-05-03 ENCOUNTER — Ambulatory Visit: Payer: Medicare PPO | Admitting: Internal Medicine
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
## 2021-05-05 ENCOUNTER — Ambulatory Visit: Payer: Medicare PPO | Admitting: Neurology

## 2021-05-05 ENCOUNTER — Encounter: Payer: Self-pay | Admitting: Neurology

## 2021-05-05 ENCOUNTER — Other Ambulatory Visit: Payer: Self-pay

## 2021-05-05 ENCOUNTER — Encounter: Payer: Self-pay | Admitting: Internal Medicine

## 2021-05-05 ENCOUNTER — Other Ambulatory Visit (INDEPENDENT_AMBULATORY_CARE_PROVIDER_SITE_OTHER): Payer: Medicare PPO

## 2021-05-05 VITALS — BP 134/76 | HR 74 | Ht 65.0 in | Wt 203.0 lb

## 2021-05-05 DIAGNOSIS — G3184 Mild cognitive impairment, so stated: Secondary | ICD-10-CM

## 2021-05-05 DIAGNOSIS — E538 Deficiency of other specified B group vitamins: Secondary | ICD-10-CM

## 2021-05-05 DIAGNOSIS — I6930 Unspecified sequelae of cerebral infarction: Secondary | ICD-10-CM | POA: Diagnosis not present

## 2021-05-05 DIAGNOSIS — G44229 Chronic tension-type headache, not intractable: Secondary | ICD-10-CM

## 2021-05-05 DIAGNOSIS — Z8669 Personal history of other diseases of the nervous system and sense organs: Secondary | ICD-10-CM

## 2021-05-05 DIAGNOSIS — G4733 Obstructive sleep apnea (adult) (pediatric): Secondary | ICD-10-CM | POA: Diagnosis not present

## 2021-05-05 LAB — VITAMIN B12: Vitamin B-12: 781 pg/mL (ref 211–911)

## 2021-05-05 MED ORDER — NORTRIPTYLINE HCL 10 MG PO CAPS: 10.0000 mg | ORAL_CAPSULE | Freq: Every day | ORAL | 5 refills | Status: DC

## 2021-05-05 NOTE — Patient Instructions (Signed)
Start nortriptyline 10mg  at bedtime to help reduce headache frequency.  If no improvement in 6 weeks, contact me and we can increase dose Limit use of pain relievers (such as Tylenol) to no more than 2 days out of week to prevent risk of rebound or medication-overuse headache. Refer you to sleep medicine at [facility] for evaluation of sleep apnea

## 2021-05-05 NOTE — Progress Notes (Signed)
Subjective:    Patient ID: Kathleen Solis, female    DOB: August 14, 1966, 55 y.o.   MRN: [id number]  This visit occurred during the SARS-CoV-2 public health emergency.  Safety protocols were in place, including screening questions prior to the visit, additional usage of staff PPE, and extensive cleaning of exam room while observing appropriate contact time as indicated for disinfecting solutions.     HPI The patient is here for follow up of their chronic medical problems, including htn, hichol.  Saw Dr Everlena Cooper yesterday - started on nortriptyline for headaches.    Woke up one Sat - left side of head throbbing and right ear pulling - went to urgent care.  Throat was red and ears were both infected.  She was prescribed amoxicillin and it helped, but it is not gone completely.    She is doing flonase daily.   Dr Delton Coombes took her off the symbicort and she sees the allergist tomorrow.   Dr Delton Coombes will likely do a bronchoscopy.      Medications and allergies reviewed with patient and updated if appropriate.  Patient Active Problem List   Diagnosis Date Noted   Bilateral non-suppurative otitis media 05/06/2021   Urinary hesitancy 12/31/2020   MCI (mild cognitive impairment) with memory loss - Dr Everlena Cooper 12/31/2020   Midline cystocele    GERD (gastroesophageal reflux disease)    Mild asthma - Dr Delton Coombes 08/27/2020   Chronic cough 03/17/2019   Lumbar radiculopathy 05/15/2018   Lumbar back pain 05/10/2018   Vitamin B12 deficiency 02/23/2018   Tingling 02/21/2018   Onychomycosis 02/21/2018   Hyperlipidemia 02/14/2017   Chest pain 02/14/2017   Hyperglycemia 02/14/2017   Tinnitus of right ear 11/16/2016   Constipation 09/13/2016   Oropharyngeal dysphagia 08/16/2016   Allergic rhinitis 08/16/2016   Carotid arterial disease 04/10/2016   Diverticulitis of colon without hemorrhage 02/25/2016   Bone spur of right foot 02/08/2016   History of cerebrovascular accident (CVA) with residual  deficit    Leg neuralgia, right    Episodic tension-type headache 01/12/2016   Benign essential hypertension 03/03/2015   Cyst of left ovary 03/03/2015    Current Outpatient Medications on File Prior to Visit  Medication Sig Dispense Refill   acetaminophen (TYLENOL) 500 MG tablet Take 500 mg by mouth every 6 (six) hours as needed. Patients states she usually takes 2-4 tabs daily     aspirin EC 81 MG tablet Take 1 tablet (81 mg total) by mouth daily. Swallow whole. 90 tablet 3   fluticasone (FLONASE) 50 MCG/ACT nasal spray Place 2 sprays into both nostrils daily. 16 g 6   nortriptyline (PAMELOR) 10 MG capsule Take 1 capsule (10 mg total) by mouth at bedtime. 30 capsule 5   pantoprazole (PROTONIX) 20 MG tablet Take 2 tablets (40 mg total) by mouth 2 (two) times daily.     promethazine (PHENERGAN) 6.25 MG/5ML syrup Take by mouth 2 (two) times daily before a meal.     vitamin B-12 (CYANOCOBALAMIN) 1000 MCG tablet Take 1,000 mcg by mouth daily.     budesonide-formoterol (SYMBICORT) 160-4.5 MCG/ACT inhaler Inhale 2 puffs into the lungs 2 (two) times daily. (Patient not taking: No sig reported) 1 each 5   No current facility-administered medications on file prior to visit.    Past Medical History:  Diagnosis Date   Allergic rhinitis 08/16/2016   Benign essential hypertension 03/03/2015   Bone spur of right foot 02/08/2016   Bunion of left foot 02/08/2016  Carotid arterial disease 04/10/2016   03/2016: right 40-59%, left < 40%    Chest pain 02/14/2017   Cyst of left ovary 03/03/2015   Diverticulitis of colon without hemorrhage 02/25/2016   Epigastric pain 05/06/2020   Episodic tension-type headache 01/12/2016   Taking motrin 200 mg bid   GERD (gastroesophageal reflux disease)    History of cerebrovascular accident (CVA) with residual deficit 2001   Continue aspirin 81 mg daily, statin and current blood pressure medications; Blood pressure has been well controlled and cholesterol has been at goal    Hyperglycemia 02/14/2017   Chronic; encouraged low sugar/carbohydrate diet and regular exercise   Hyperlipidemia    diet controlled - no med   Leg neuralgia, right    Related to CVA    Lumbar back pain 05/10/2018   Lumbar radiculopathy 05/15/2018   Pain seems consistent with radiculopathy   MCI (mild cognitive impairment) with memory loss 12/31/2020   Midline cystocele    Mild asthma 08/27/2020   Onychomycosis 02/21/2018   Toenails; Ciclopirox prescribed   Sciatica    right   Sinus infection 03/22/2019   SVD (spontaneous vaginal delivery)    x 2   Tinnitus of right ear 11/16/2016   Vitamin B12 deficiency 02/23/2018    Past Surgical History:  Procedure Laterality Date   ABDOMINAL HYSTERECTOMY     partial   ANAL FISSURE REPAIR     APPENDECTOMY     CHOLECYSTECTOMY     CYSTOSCOPY N/A 10/04/2017   Procedure: CYSTOSCOPY;  Surgeon: Gerald Leitz, MD;  Location: [facility];  Service: Gynecology;  Laterality: N/A;   FOOT SURGERY Left    big toe bunion removed   LAPAROSCOPIC BILATERAL SALPINGO OOPHERECTOMY Bilateral 10/04/2017   Procedure: LAPAROSCOPIC BILATERAL SALPINGO OOPHORECTOMY, PELVIC WASHINGS;  Surgeon: Gerald Leitz, MD;  Location: [facility];  Service: Gynecology;  Laterality: Bilateral;   TUBAL LIGATION     WISDOM TOOTH EXTRACTION      Social History   Socioeconomic History   Marital status: Married    Spouse name: Not on file   Number of children: 2   Years of education: 14   Highest education level: Associate degree: academic program  Occupational History   Occupation: Disability  Tobacco Use   Smoking status: Never   Smokeless tobacco: Never  Vaping Use   Vaping Use: Never used  Substance and Sexual Activity   Alcohol use: No    Alcohol/week: 0.0 standard drinks   Drug use: No   Sexual activity: Not on file  Other Topics Concern   Not on file  Social History Narrative   No regular exercise      Pt is right handed, she occasionally drinks tea, walks QOD. She lives with  her 34 yr old son, he has mild cerebral palsy.   Social Determinants of Health   Financial Resource Strain: Low Risk    Difficulty of Paying Living Expenses: Not hard at all  Food Insecurity: No Food Insecurity   Worried About Programme researcher, broadcasting/film/video in the Last Year: Never true   Ran Out of Food in the Last Year: Never true  Transportation Needs: No Transportation Needs   Lack of Transportation (Medical): No   Lack of Transportation (Non-Medical): No  Physical Activity: Inactive   Days of Exercise per Week: 0 days   Minutes of Exercise per Session: 0 min  Stress: No Stress Concern Present   Feeling of Stress : Not at all  Social Connections: Moderately Integrated  Frequency of Communication with Friends and Family: More than three times a week   Frequency of Social Gatherings with Friends and Family: More than three times a week   Attends Religious Services: 1 to 4 times per year   Active Member of [employer] or Organizations: Yes   Attends Banker Meetings: 1 to 4 times per year   Marital Status: Never married    Family History  Problem Relation Age of Onset   Breast cancer Sister    Diabetes Sister    Glaucoma Sister    Cataracts Sister    Hypertension Sister    Anuerysm Mother 36   Pancreatic cancer Father    Retinal detachment Brother    Cataracts Sister    Breast cancer Cousin    Anuerysm Cousin    Breast cancer Niece    Esophageal cancer Nephew    Developmental delay Nephew    Cerebral palsy Son        mild   Colon polyps Neg Hx    Prostate cancer Neg Hx    Rectal cancer Neg Hx    Stomach cancer Neg Hx    Uterine cancer Neg Hx     Review of Systems  Constitutional:  Negative for chills and fever.  HENT:  Positive for ear pain (mild achy) and sore throat (mild). Negative for sinus pain.   Respiratory:  Positive for cough. Negative for chest tightness, shortness of breath and wheezing.   Cardiovascular:  Negative for chest pain, palpitations and leg  swelling.  Gastrointestinal:  Positive for nausea.       No gerd  Musculoskeletal:  Positive for back pain.  Skin:        Skin lesion lateral left  ankle - ? moles  Neurological:  Positive for headaches. Negative for dizziness and light-headedness.       Burning sensation in R anterior lower leg      Objective:   Vitals:   05/06/21 0952  BP: 108/70  Pulse: 68  Resp: 16  Temp: 98.2 F (36.8 C)  SpO2: 98%   BP Readings from Last 3 Encounters:  05/06/21 108/70  05/06/21 108/70  05/05/21 134/76   Wt Readings from Last 3 Encounters:  05/06/21 203 lb (92.1 kg)  05/06/21 203 lb 12.8 oz (92.4 kg)  05/05/21 203 lb (92.1 kg)   Body mass index is 33.78 kg/m.   Physical Exam    Constitutional: Appears well-developed and well-nourished. No distress.  HENT:  Head: Normocephalic and atraumatic.  Ears: b/l ear canals with dry flaky wax/skin - no discharge or fluid, b/l TMs dull with possible middle ear fluid, minimal erythema Neck: Neck supple. No tracheal deviation present. No thyromegaly present.  No cervical lymphadenopathy Cardiovascular: Normal rate, regular rhythm and normal heart sounds.   No murmur heard. No carotid bruit .  No edema Pulmonary/Chest: Effort normal and breath sounds normal. No respiratory distress. No has no wheezes. No rales.  Skin: Skin is warm and dry. Not diaphoretic.  Psychiatric: Normal mood and affect. Behavior is normal.      Assessment & Plan:    See Problem List for Assessment and Plan of chronic medical problems.

## 2021-05-05 NOTE — Patient Instructions (Addendum)
     Medications changes include :   Augmentin twice daily for your ears   Your prescription(s) have been submitted to your pharmacy. Please take as directed and contact our office if you believe you are having problem(s) with the medication(s).    Please followup in 6 months

## 2021-05-06 ENCOUNTER — Ambulatory Visit: Payer: Medicare PPO | Admitting: Internal Medicine

## 2021-05-06 ENCOUNTER — Ambulatory Visit (INDEPENDENT_AMBULATORY_CARE_PROVIDER_SITE_OTHER): Payer: Medicare PPO

## 2021-05-06 VITALS — BP 108/70 | HR 68 | Temp 98.2°F | Resp 16 | Ht 65.0 in | Wt 203.8 lb

## 2021-05-06 VITALS — BP 108/70 | HR 68 | Temp 98.2°F | Resp 16 | Ht 65.0 in | Wt 203.0 lb

## 2021-05-06 DIAGNOSIS — I10 Essential (primary) hypertension: Secondary | ICD-10-CM

## 2021-05-06 DIAGNOSIS — E782 Mixed hyperlipidemia: Secondary | ICD-10-CM | POA: Diagnosis not present

## 2021-05-06 DIAGNOSIS — H6593 Unspecified nonsuppurative otitis media, bilateral: Secondary | ICD-10-CM | POA: Insufficient documentation

## 2021-05-06 DIAGNOSIS — Z0000 Encounter for general adult medical examination without abnormal findings: Secondary | ICD-10-CM | POA: Diagnosis not present

## 2021-05-06 MED ORDER — ATORVASTATIN CALCIUM 10 MG PO TABS: 10.0000 mg | ORAL_TABLET | Freq: Every day | ORAL | 1 refills | Status: DC

## 2021-05-06 MED ORDER — AMOXICILLIN-POT CLAVULANATE 875-125 MG PO TABS: 1.0000 | ORAL_TABLET | Freq: Two times a day (BID) | ORAL | 0 refills | Status: DC

## 2021-05-06 MED ORDER — HYDROCHLOROTHIAZIDE 25 MG PO TABS: 25.0000 mg | ORAL_TABLET | Freq: Every day | ORAL | 1 refills | Status: DC

## 2021-05-06 MED ORDER — METOPROLOL SUCCINATE ER 100 MG PO TB24: 100.0000 mg | ORAL_TABLET | Freq: Every day | ORAL | 1 refills | Status: DC

## 2021-05-06 NOTE — Assessment & Plan Note (Signed)
Acute Completed 1 week of abx, but infection not completely treated augmentin 875-125 mg bid x 1 week Continue flonase daily to help with middle ear fluid

## 2021-05-06 NOTE — Progress Notes (Signed)
Subjective:   Kathleen Solis is a 55 y.o. female who presents for an Initial Medicare Annual Wellness Visit.  Review of Systems     Cardiac Risk Factors include: dyslipidemia;hypertension;obesity (BMI >30kg/m2)     Objective:    Today's Vitals   05/06/21 0927  BP: 108/70  Pulse: 68  Resp: 16  Temp: 98.2 F (36.8 C)  SpO2: 98%  Weight: 203 lb 12.8 oz (92.4 kg)  Height: 5\' 5"  (1.651 m)  PainSc: 0-No pain   Body mass index is 33.91 kg/m.  Advanced Directives 05/06/2021 01/19/2021 11/17/2020 11/06/2020 05/10/2020 02/08/2020 11/02/2019  Does Patient Have a Medical Advance Directive? No No No No No No No  Would patient like information on creating a medical advance directive? No - Patient declined No - Patient declined No - Patient declined - - No - Patient declined -    Current Medications (verified) Outpatient Encounter Medications as of 05/06/2021  Medication Sig   acetaminophen (TYLENOL) 500 MG tablet Take 500 mg by mouth every 6 (six) hours as needed. Patients states she usually takes 2-4 tabs daily   aspirin EC 81 MG tablet Take 1 tablet (81 mg total) by mouth daily. Swallow whole.   atorvastatin (LIPITOR) 10 MG tablet Take 1 tablet (10 mg total) by mouth at bedtime.   fluticasone (FLONASE) 50 MCG/ACT nasal spray Place 2 sprays into both nostrils daily.   hydrochlorothiazide (HYDRODIURIL) 25 MG tablet Take 1 tablet (25 mg total) by mouth daily.   metoprolol succinate (TOPROL-XL) 100 MG 24 hr tablet Take 1 tablet (100 mg total) by mouth at bedtime.   nortriptyline (PAMELOR) 10 MG capsule Take 1 capsule (10 mg total) by mouth at bedtime.   pantoprazole (PROTONIX) 20 MG tablet Take 2 tablets (40 mg total) by mouth 2 (two) times daily.   promethazine (PHENERGAN) 6.25 MG/5ML syrup Take by mouth 2 (two) times daily before a meal.   vitamin B-12 (CYANOCOBALAMIN) 1000 MCG tablet Take 1,000 mcg by mouth daily.   budesonide-formoterol (SYMBICORT) 160-4.5 MCG/ACT inhaler Inhale 2 puffs  into the lungs 2 (two) times daily. (Patient not taking: No sig reported)   No facility-administered encounter medications on file as of 05/06/2021.    Allergies (verified) Benzonatate, Darvon [propoxyphene], Ibuprofen, Morphine and related, Prednisone, Sulfa antibiotics, Tussionex pennkinetic er [hydrocod polst-cpm polst er], Buprenorphine hcl, Ivp dye [iodinated diagnostic agents], and Prochlorperazine edisylate   History: Past Medical History:  Diagnosis Date   Allergic rhinitis 08/16/2016   Benign essential hypertension 03/03/2015   Bone spur of right foot 02/08/2016   Bunion of left foot 02/08/2016   Carotid arterial disease 04/10/2016   03/2016: right 40-59%, left < 40%    Chest pain 02/14/2017   Cyst of left ovary 03/03/2015   Diverticulitis of colon without hemorrhage 02/25/2016   Epigastric pain 05/06/2020   Episodic tension-type headache 01/12/2016   Taking motrin 200 mg bid   GERD (gastroesophageal reflux disease)    History of cerebrovascular accident (CVA) with residual deficit 2001   Continue aspirin 81 mg daily, statin and current blood pressure medications; Blood pressure has been well controlled and cholesterol has been at goal   Hyperglycemia 02/14/2017   Chronic; encouraged low sugar/carbohydrate diet and regular exercise   Hyperlipidemia    diet controlled - no med   Leg neuralgia, right    Related to CVA    Lumbar back pain 05/10/2018   Lumbar radiculopathy 05/15/2018   Pain seems consistent with radiculopathy   MCI (mild cognitive  impairment) with memory loss 12/31/2020   Midline cystocele    Mild asthma 08/27/2020   Onychomycosis 02/21/2018   Toenails; Ciclopirox prescribed   Sciatica    right   Sinus infection 03/22/2019   SVD (spontaneous vaginal delivery)    x 2   Tinnitus of right ear 11/16/2016   Vitamin B12 deficiency 02/23/2018   Past Surgical History:  Procedure Laterality Date   ABDOMINAL HYSTERECTOMY     partial   ANAL FISSURE REPAIR     APPENDECTOMY      CHOLECYSTECTOMY     CYSTOSCOPY N/A 10/04/2017   Procedure: CYSTOSCOPY;  Surgeon: Gerald Leitz, MD;  Location: [facility];  Service: Gynecology;  Laterality: N/A;   FOOT SURGERY Left    big toe bunion removed   LAPAROSCOPIC BILATERAL SALPINGO OOPHERECTOMY Bilateral 10/04/2017   Procedure: LAPAROSCOPIC BILATERAL SALPINGO OOPHORECTOMY, PELVIC WASHINGS;  Surgeon: Gerald Leitz, MD;  Location: [facility];  Service: Gynecology;  Laterality: Bilateral;   TUBAL LIGATION     WISDOM TOOTH EXTRACTION     Family History  Problem Relation Age of Onset   Breast cancer Sister    Diabetes Sister    Glaucoma Sister    Cataracts Sister    Hypertension Sister    Anuerysm Mother 37   Pancreatic cancer Father    Retinal detachment Brother    Cataracts Sister    Breast cancer Cousin    Anuerysm Cousin    Breast cancer Niece    Esophageal cancer Nephew    Developmental delay Nephew    Cerebral palsy Son        mild   Colon polyps Neg Hx    Prostate cancer Neg Hx    Rectal cancer Neg Hx    Stomach cancer Neg Hx    Uterine cancer Neg Hx    Social History   Socioeconomic History   Marital status: Married    Spouse name: Not on file   Number of children: 2   Years of education: 14   Highest education level: Associate degree: academic program  Occupational History   Occupation: Disability  Tobacco Use   Smoking status: Never   Smokeless tobacco: Never  Vaping Use   Vaping Use: Never used  Substance and Sexual Activity   Alcohol use: No    Alcohol/week: 0.0 standard drinks   Drug use: No   Sexual activity: Not on file  Other Topics Concern   Not on file  Social History Narrative   No regular exercise      Pt is right handed, she occasionally drinks tea, walks QOD. She lives with her 3 yr old son, he has mild cerebral palsy.   Social Determinants of Health   Financial Resource Strain: Low Risk    Difficulty of Paying Living Expenses: Not hard at all  Food Insecurity: No Food Insecurity    Worried About Programme researcher, broadcasting/film/video in the Last Year: Never true   Ran Out of Food in the Last Year: Never true  Transportation Needs: No Transportation Needs   Lack of Transportation (Medical): No   Lack of Transportation (Non-Medical): No  Physical Activity: Inactive   Days of Exercise per Week: 0 days   Minutes of Exercise per Session: 0 min  Stress: No Stress Concern Present   Feeling of Stress : Not at all  Social Connections: Moderately Integrated   Frequency of Communication with Friends and Family: More than three times a week   Frequency of Social Gatherings with  Friends and Family: More than three times a week   Attends Religious Services: 1 to 4 times per year   Active Member of Clubs or Organizations: Yes   Attends Banker Meetings: 1 to 4 times per year   Marital Status: Never married    Tobacco Counseling Counseling given: Not Answered   Clinical Intake:  Pre-visit preparation completed: Yes  Pain : No/denies pain Pain Score: 0-No pain     BMI - recorded: 33.91 Nutritional Status: BMI > 30  Obese Nutritional Risks: None Diabetes: No  How often do you need to have someone help you when you read instructions, pamphlets, or other written materials from your doctor or pharmacy?: 1 - Never What is the last grade level you completed in school?: Associate's Degree  Diabetic? no  Interpreter Needed?: No  Information entered by :: Susie Cassette, LPN   Activities of Daily Living In your present state of health, do you have any difficulty performing the following activities: 05/06/2021  Hearing? N  Vision? N  Difficulty concentrating or making decisions? Y  Walking or climbing stairs? N  Dressing or bathing? N  Doing errands, shopping? N  Preparing Food and eating ? N  Using the Toilet? N  In the past six months, have you accidently leaked urine? Y  Do you have problems with loss of bowel control? N  Managing your Medications? N  Managing  your Finances? N  Housekeeping or managing your Housekeeping? N  Some recent data might be hidden    Patient Care Team: Pincus Sanes, MD as PCP - General (Internal Medicine) Parke Poisson, MD as PCP - Cardiology (Cardiology) Drema Dallas, DO as Consulting Physician (Neurology) Hurshel Party, OD as Consulting Physician (Optometry)  Indicate any recent Medical Services you may have received from other than [facility] providers in the past year (date may be approximate).     Assessment:   This is a routine wellness examination for Kathleen Solis.  Hearing/Vision screen Hearing Screening - Comments:: Patient denied any hearing difficulty. Vision Screening - Comments:: Patient wears corrective lenses.  Eye exam done annually by [facility].  Dietary issues and exercise activities discussed: Current Exercise Habits: The patient does not participate in regular exercise at present, Exercise limited by: respiratory conditions(s)   Goals Addressed               This Visit's Progress     Patient Stated (pt-stated)        My goal is to lose 20 pounds.      Depression Screen PHQ 2/9 Scores 05/06/2021 11/03/2020 06/07/2017  PHQ - 2 Score 0 0 0  PHQ- 9 Score - 4 -    Fall Risk Fall Risk  05/06/2021 11/06/2020 10/30/2019 05/06/2019 10/31/2018  Falls in the past year? 0 0 0 0 0  Number falls in past yr: 0 0 0 - -  Injury with Fall? 0 0 0 - -  Risk for fall due to : No Fall Risks - - - -  Follow up Falls evaluation completed - - - Falls evaluation completed    FALL RISK PREVENTION PERTAINING TO THE HOME:  Any stairs in or around the home? No  If so, are there any without handrails? No  Home free of loose throw rugs in walkways, pet beds, electrical cords, etc? Yes  Adequate lighting in your home to reduce risk of falls? Yes   ASSISTIVE DEVICES UTILIZED TO PREVENT FALLS:  Life alert?  No  Use of a cane, walker or w/c? No  Grab bars in the bathroom? No  Shower chair or  bench in shower? No  Elevated toilet seat or a handicapped toilet? No   TIMED UP AND GO:  Was the test performed? Yes .  Length of time to ambulate 10 feet: 7 sec.   Gait steady and fast without use of assistive device  Cognitive Function: Cognitive status assessed by direct observation. Yes; Patient has current diagnosis of cognitive impairment. Yes; Patient is followed by neurology for ongoing assessment.    Montreal Cognitive Assessment  12/26/2017  Visuospatial/ Executive (0/5) 3  Naming (0/3) 3  Attention: Read list of digits (0/2) 0  Attention: Read list of letters (0/1) 1  Attention: Serial 7 subtraction starting at 100 (0/3) 1  Language: Repeat phrase (0/2) 0  Language : Fluency (0/1) 0  Abstraction (0/2) 1  Delayed Recall (0/5) 0  Orientation (0/6) 5  Total 14  Adjusted Score (based on education) 14      Immunizations Immunization History  Administered Date(s) Administered   PFIZER(Purple Top)SARS-COV-2 Vaccination 04/11/2020, 06/16/2020    TDAP status: Due, Education has been provided regarding the importance of this vaccine. Advised may receive this vaccine at local pharmacy or Health Dept. Aware to provide a copy of the vaccination record if obtained from local pharmacy or Health Dept. Verbalized acceptance and understanding.  Flu Vaccine status: Declined, Education has been provided regarding the importance of this vaccine but patient still declined. Advised may receive this vaccine at local pharmacy or Health Dept. Aware to provide a copy of the vaccination record if obtained from local pharmacy or Health Dept. Verbalized acceptance and understanding.  Pneumococcal vaccine status: Declined,  Education has been provided regarding the importance of this vaccine but patient still declined. Advised may receive this vaccine at local pharmacy or Health Dept. Aware to provide a copy of the vaccination record if obtained from local pharmacy or Health Dept. Verbalized  acceptance and understanding.   Covid-19 vaccine status: Completed vaccines  Qualifies for Shingles Vaccine? Yes   Zostavax completed No   Shingrix Completed?: No.    Education has been provided regarding the importance of this vaccine. Patient has been advised to call insurance company to determine out of pocket expense if they have not yet received this vaccine. Advised may also receive vaccine at local pharmacy or Health Dept. Verbalized acceptance and understanding.  Screening Tests Health Maintenance  Topic Date Due   TETANUS/TDAP  Never done   Zoster Vaccines- Shingrix (1 of 2) Never done   COVID-19 Vaccine (3 - Booster for Pfizer series) 11/14/2020   INFLUENZA VACCINE  11/19/2021 (Originally 03/22/2021)   MAMMOGRAM  08/13/2021   COLONOSCOPY (Pts 45-61yrs Insurance coverage will need to be confirmed)  04/06/2025   Hepatitis C Screening  Completed   HIV Screening  Completed   Pneumococcal Vaccine 65-27 Years old  Aged Out   HPV VACCINES  Aged Out    Health Maintenance  Health Maintenance Due  Topic Date Due   TETANUS/TDAP  Never done   Zoster Vaccines- Shingrix (1 of 2) Never done   COVID-19 Vaccine (3 - Booster for Pfizer series) 11/14/2020    Colorectal cancer screening: Type of screening: Colonoscopy. Completed 04/07/2015. Repeat every 10 years  Mammogram status: Completed 08/13/2020. Repeat every year  Bone density status: never done  Lung Cancer Screening: (Low Dose CT Chest recommended if Age 38-80 years, 30 pack-year currently smoking OR have quit  w/in 15years.) does not qualify.   Lung Cancer Screening Referral: no  Additional Screening:  Hepatitis C Screening: does qualify; Completed yes  Vision Screening: Recommended annual ophthalmology exams for early detection of glaucoma and other disorders of the eye. Is the patient up to date with their annual eye exam?  Yes  Who is the provider or what is the name of the office in which the patient attends annual eye  exams? [facility] If pt is not established with a provider, would they like to be referred to a provider to establish care? No .   Dental Screening: Recommended annual dental exams for proper oral hygiene  Community Resource Referral / Chronic Care Management: CRR required this visit?  No   CCM required this visit?  No      Plan:     I have personally reviewed and noted the following in the patient's chart:   Medical and social history Use of alcohol, tobacco or illicit drugs  Current medications and supplements including opioid prescriptions. Patient is not currently taking opioid prescriptions. Functional ability and status Nutritional status Physical activity Advanced directives List of other physicians Hospitalizations, surgeries, and ER visits in previous 12 months Vitals Screenings to include cognitive, depression, and falls Referrals and appointments  In addition, I have reviewed and discussed with patient certain preventive protocols, quality metrics, and best practice recommendations. A written personalized care plan for preventive services as well as general preventive health recommendations were provided to patient.     Mickeal Needy, LPN   1/75/1025   Nurse Notes:  Hearing Screening - Comments:: Patient denied any hearing difficulty. Vision Screening - Comments:: Patient wears corrective lenses.  Eye exam done annually by [facility].

## 2021-05-06 NOTE — Assessment & Plan Note (Signed)
Chronic BP well controlled Continue hctz 25 mg qd, metoprolol x; 100 mg qd

## 2021-05-06 NOTE — Patient Instructions (Signed)
Kathleen Solis , Thank you for taking time to come for your Medicare Wellness Visit. I appreciate your ongoing commitment to your health goals. Please review the following plan we discussed and let me know if I can assist you in the future.   Screening recommendations/referrals: Colonoscopy: 04/07/2015; due every 10 years Mammogram: 08/13/2020; due every year Bone Density: never done Recommended yearly ophthalmology/optometry visit for glaucoma screening and checkup Recommended yearly dental visit for hygiene and checkup  Vaccinations: Influenza vaccine: declined Pneumococcal vaccine: declined Tdap vaccine: declined Shingles vaccine: declined  Covid-19: 04/11/2020, 06/16/2020  Advanced directives: Advance directive discussed with you today. Even though you declined this today please call our office should you change your mind and we can give you the proper paperwork for you to fill out.  Conditions/risks identified: Yes; My goal is to lose 20 pounds. Client understands the importance of follow-up with providers by attending scheduled visits and discussed goals to eat healthier, increase physical activity, exercise the brain, socialize more, get enough sleep and make time for laughter.  Next appointment: Please schedule your next Medicare Wellness Visit with your Nurse Health Advisor in 1 year by calling [phone].  Preventive Care 40-64 Years, Female Preventive care refers to lifestyle choices and visits with your health care provider that can promote health and wellness. What does preventive care include? A yearly physical exam. This is also called an annual well check. Dental exams once or twice a year. Routine eye exams. Ask your health care provider how often you should have your eyes checked. Personal lifestyle choices, including: Daily care of your teeth and gums. Regular physical activity. Eating a healthy diet. Avoiding tobacco and drug use. Limiting alcohol use. Practicing  safe sex. Taking low-dose aspirin daily starting at age 95. Taking vitamin and mineral supplements as recommended by your health care provider. What happens during an annual well check? The services and screenings done by your health care provider during your annual well check will depend on your age, overall health, lifestyle risk factors, and family history of disease. Counseling  Your health care provider may ask you questions about your: Alcohol use. Tobacco use. Drug use. Emotional well-being. Home and relationship well-being. Sexual activity. Eating habits. Work and work Statistician. Method of birth control. Menstrual cycle. Pregnancy history. Screening  You may have the following tests or measurements: Height, weight, and BMI. Blood pressure. Lipid and cholesterol levels. These may be checked every 5 years, or more frequently if you are over 45 years old. Skin check. Lung cancer screening. You may have this screening every year starting at age 31 if you have a 30-pack-year history of smoking and currently smoke or have quit within the past 15 years. Fecal occult blood test (FOBT) of the stool. You may have this test every year starting at age 68. Flexible sigmoidoscopy or colonoscopy. You may have a sigmoidoscopy every 5 years or a colonoscopy every 10 years starting at age 74. Hepatitis C blood test. Hepatitis B blood test. Sexually transmitted disease (STD) testing. Diabetes screening. This is done by checking your blood sugar (glucose) after you have not eaten for a while (fasting). You may have this done every 1-3 years. Mammogram. This may be done every 1-2 years. Talk to your health care provider about when you should start having regular mammograms. This may depend on whether you have a family history of breast cancer. BRCA-related cancer screening. This may be done if you have a family history of breast, ovarian, tubal, or peritoneal  cancers. Pelvic exam and Pap test.  This may be done every 3 years starting at age 42. Starting at age 12, this may be done every 5 years if you have a Pap test in combination with an HPV test. Bone density scan. This is done to screen for osteoporosis. You may have this scan if you are at high risk for osteoporosis. Discuss your test results, treatment options, and if necessary, the need for more tests with your health care provider. Vaccines  Your health care provider may recommend certain vaccines, such as: Influenza vaccine. This is recommended every year. Tetanus, diphtheria, and acellular pertussis (Tdap, Td) vaccine. You may need a Td booster every 10 years. Zoster vaccine. You may need this after age 25. Pneumococcal 13-valent conjugate (PCV13) vaccine. You may need this if you have certain conditions and were not previously vaccinated. Pneumococcal polysaccharide (PPSV23) vaccine. You may need one or two doses if you smoke cigarettes or if you have certain conditions. Talk to your health care provider about which screenings and vaccines you need and how often you need them. This information is not intended to replace advice given to you by your health care provider. Make sure you discuss any questions you have with your health care provider. Document Released: 09/04/2015 Document Revised: 04/27/2016 Document Reviewed: 06/09/2015 Elsevier Interactive Patient Education  2017 [place] Prevention in the Home Falls can cause injuries. They can happen to people of all ages. There are many things you can do to make your home safe and to help prevent falls. What can I do on the outside of my home? Regularly fix the edges of walkways and driveways and fix any cracks. Remove anything that might make you trip as you walk through a door, such as a raised step or threshold. Trim any bushes or trees on the path to your home. Use bright outdoor lighting. Clear any walking paths of anything that might make someone trip,  such as rocks or tools. Regularly check to see if handrails are loose or broken. Make sure that both sides of any steps have handrails. Any raised decks and porches should have guardrails on the edges. Have any leaves, snow, or ice cleared regularly. Use sand or salt on walking paths during winter. Clean up any spills in your garage right away. This includes oil or grease spills. What can I do in the bathroom? Use night lights. Install grab bars by the toilet and in the tub and shower. Do not use towel bars as grab bars. Use non-skid mats or decals in the tub or shower. If you need to sit down in the shower, use a plastic, non-slip stool. Keep the floor dry. Clean up any water that spills on the floor as soon as it happens. Remove soap buildup in the tub or shower regularly. Attach bath mats securely with double-sided non-slip rug tape. Do not have throw rugs and other things on the floor that can make you trip. What can I do in the bedroom? Use night lights. Make sure that you have a light by your bed that is easy to reach. Do not use any sheets or blankets that are too big for your bed. They should not hang down onto the floor. Have a firm chair that has side arms. You can use this for support while you get dressed. Do not have throw rugs and other things on the floor that can make you trip. What can I do in the  kitchen? Clean up any spills right away. Avoid walking on wet floors. Keep items that you use a lot in easy-to-reach places. If you need to reach something above you, use a strong step stool that has a grab bar. Keep electrical cords out of the way. Do not use floor polish or wax that makes floors slippery. If you must use wax, use non-skid floor wax. Do not have throw rugs and other things on the floor that can make you trip. What can I do with my stairs? Do not leave any items on the stairs. Make sure that there are handrails on both sides of the stairs and use them. Fix  handrails that are broken or loose. Make sure that handrails are as long as the stairways. Check any carpeting to make sure that it is firmly attached to the stairs. Fix any carpet that is loose or worn. Avoid having throw rugs at the top or bottom of the stairs. If you do have throw rugs, attach them to the floor with carpet tape. Make sure that you have a light switch at the top of the stairs and the bottom of the stairs. If you do not have them, ask someone to add them for you. What else can I do to help prevent falls? Wear shoes that: Do not have high heels. Have rubber bottoms. Are comfortable and fit you well. Are closed at the toe. Do not wear sandals. If you use a stepladder: Make sure that it is fully opened. Do not climb a closed stepladder. Make sure that both sides of the stepladder are locked into place. Ask someone to hold it for you, if possible. Clearly mark and make sure that you can see: Any grab bars or handrails. First and last steps. Where the edge of each step is. Use tools that help you move around (mobility aids) if they are needed. These include: Canes. Walkers. Scooters. Crutches. Turn on the lights when you go into a dark area. Replace any light bulbs as soon as they burn out. Set up your furniture so you have a clear path. Avoid moving your furniture around. If any of your floors are uneven, fix them. If there are any pets around you, be aware of where they are. Review your medicines with your doctor. Some medicines can make you feel dizzy. This can increase your chance of falling. Ask your doctor what other things that you can do to help prevent falls. This information is not intended to replace advice given to you by your health care provider. Make sure you discuss any questions you have with your health care provider. Document Released: 06/04/2009 Document Revised: 01/14/2016 Document Reviewed: 09/12/2014 Elsevier Interactive Patient Education  2017  [employer].

## 2021-05-06 NOTE — Assessment & Plan Note (Signed)
Chronic Continue atorvastatin 10 mg HS Regular exercise and healthy diet encouraged

## 2021-05-07 ENCOUNTER — Encounter: Payer: Self-pay | Admitting: Allergy

## 2021-05-07 ENCOUNTER — Other Ambulatory Visit: Payer: Self-pay

## 2021-05-07 ENCOUNTER — Ambulatory Visit: Payer: Medicare PPO | Admitting: Allergy

## 2021-05-07 VITALS — BP 126/78 | HR 70 | Temp 97.8°F | Resp 18 | Ht 65.0 in | Wt 202.5 lb

## 2021-05-07 DIAGNOSIS — R053 Chronic cough: Secondary | ICD-10-CM

## 2021-05-07 DIAGNOSIS — H1013 Acute atopic conjunctivitis, bilateral: Secondary | ICD-10-CM

## 2021-05-07 DIAGNOSIS — J3089 Other allergic rhinitis: Secondary | ICD-10-CM

## 2021-05-07 MED ORDER — AZELASTINE HCL 0.1 % NA SOLN: 2.0000 | Freq: Two times a day (BID) | NASAL | 5 refills | Status: DC

## 2021-05-07 NOTE — Patient Instructions (Addendum)
-  continue avoidance measures for grass pollen, weed pollen, tree pollen, mold and cockroach.   - for nasal congestion continue Flonase 2 sprays each nostril daily for 1-2 weeks at a time before stopping once nasal congestion improves for maximum benefit - to assess for nasal drainage (that can lead to chronic and persistent cough) try nasal Azelastine 2 sprays each nostril twice a day for next month.  If this helps minimize/improve your cough then you do have component of nasal drainage driving cough.    - if cough is allergen driven then you would likely benefit from course of allergy shots.   - consider performing nasal saline rinses.  This helps to flush out the sinus passages and also helps your medicated nasal sprays work more effectively.  Use with distilled water or boiled water and bring down to room temperature prior to use.  Breathe through your mouth the entire process.  Rinse kit provided today.  - can also use long-acting antihistamine like Allegra or Xyzal daily as needed - if medication management is not effective then consider allergen immunotherapy which is a 3-5 year therapy to help re-train the body to be not allergic to the environmental allergens above.   - continue follow-up with Dr. Lamonte Sakai - holding Symbicort for now as per Dr. Lamonte Sakai - have access to albuterol inhaler 2 puffs every 4-6 hours as needed for cough/wheeze/shortness of breath/chest tightness.  May use 15-20 minutes prior to activity.   Monitor frequency of use.     Follow-up in 3-4 months or sooner if needed

## 2021-05-07 NOTE — Progress Notes (Signed)
Follow-up Note  RE: Kathleen Solis MRN: [id number] DOB: [birth year] Date of Office Visit: 05/07/2021   History of present illness: Kathleen Solis is a 55 y.o. female presenting today for follow-up of cough variant asthma and allergic rhinitis with conjunctivitis.  She was last seen in the office on 06/05/2020 by myself.  She presents today with her husband. She states she still has a cough.  Nothing is really changed over the past year.  She is tired of having the cough.  She wakes up coughing in the morning.  It occurs throughout the day.  She was on Symbicort but she states she was not seeing any improvement in her cough.  She is taking promethazine for the cough and states it helps a little bit.  She sees Dr. Lamonte Sakai in pulmonology with last visit as a telemedicine visit on 04/21/21.  She states she was advised to stop the Symbicort at this time.  She states she has not tried the albuterol to see if this would help stop the cough when used.  She states if symptoms or not getting any better then she has been recommended to perform bronchoscopy for airway evaluation. She denies having reflux.  She does not report heartburn symptoms.  She has been on antireflux medications in the past without any relief of symptoms.  She does not feel that she has nasal drainage however has not tried any therapies for nasal drainage control.  After her last visit with me she states she did not get the [place] from the pharmacy. She states she was recently treated for double ear infection about 2 weeks ago with amoxicillin diagnosed in urgent care.  She saw her PCP 1 week ago who the patient states still noted fluid in the ear.  She was then recommended to take Augmentin which she is currently taking right now.   Review of systems: Review of Systems  Constitutional: Negative.   HENT: Negative.    Eyes: Negative.   Respiratory:  Positive for cough.   Cardiovascular: Negative.   Gastrointestinal: Negative.    Musculoskeletal: Negative.   Skin: Negative.   Neurological: Negative.    All other systems negative unless noted above in HPI  Past medical/social/surgical/family history have been reviewed and are unchanged unless specifically indicated below.  No changes  Medication List: Current Outpatient Medications  Medication Sig Dispense Refill   acetaminophen (TYLENOL) 500 MG tablet Take 500 mg by mouth every 6 (six) hours as needed. Patients states she usually takes 2-4 tabs daily     aspirin EC 81 MG tablet Take 1 tablet (81 mg total) by mouth daily. Swallow whole. 90 tablet 3   atorvastatin (LIPITOR) 10 MG tablet Take 1 tablet (10 mg total) by mouth at bedtime. 90 tablet 1   azelastine (ASTELIN) 0.1 % nasal spray Place 2 sprays into both nostrils 2 (two) times daily. Use in each nostril as directed 30 mL 5   fluticasone (FLONASE) 50 MCG/ACT nasal spray Place 2 sprays into both nostrils daily. 16 g 6   hydrochlorothiazide (HYDRODIURIL) 25 MG tablet Take 1 tablet (25 mg total) by mouth daily. 90 tablet 1   metoprolol succinate (TOPROL-XL) 100 MG 24 hr tablet Take 1 tablet (100 mg total) by mouth at bedtime. 90 tablet 1   nortriptyline (PAMELOR) 10 MG capsule Take 1 capsule (10 mg total) by mouth at bedtime. 30 capsule 5   pantoprazole (PROTONIX) 20 MG tablet Take 2 tablets (40 mg total) by mouth 2 (two)  times daily.     promethazine (PHENERGAN) 6.25 MG/5ML syrup Take by mouth 2 (two) times daily before a meal.     vitamin B-12 (CYANOCOBALAMIN) 1000 MCG tablet Take 1,000 mcg by mouth daily.     budesonide-formoterol (SYMBICORT) 160-4.5 MCG/ACT inhaler Inhale 2 puffs into the lungs 2 (two) times daily. (Patient not taking: Reported on 05/07/2021) 1 each 5   No current facility-administered medications for this visit.     Known medication allergies: Allergies  Allergen Reactions   Benzonatate Other (See Comments)    Headaches   Darvon [Propoxyphene] Other (See Comments)    Gi problems    Ibuprofen     tinnitus   Morphine And Related Itching   Prednisone Other (See Comments)    Make her head hurt   Sulfa Antibiotics Hives   Tussionex Pennkinetic Er [Hydrocod Polst-Cpm Polst Er] Other (See Comments)    Nausea, vomiting   Buprenorphine Hcl Itching   Ivp Dye [Iodinated Diagnostic Agents] Nausea And Vomiting   Prochlorperazine Edisylate Itching and Other (See Comments)    "Acts weird: Hallucinations"     Physical examination: Blood pressure 126/78, pulse 70, temperature 97.8 F (36.6 C), temperature source Temporal, resp. rate 18, height _0  (1.651 m), weight 202 lb 8 oz (91.9 kg), SpO2 98 %.  General: Alert, interactive, in no acute distress. HEENT: PERRLA, Tms full with visible fluid, turbinates non-edematous without discharge, post-pharynx non erythematous. Neck: Supple without lymphadenopathy. Lungs: Clear to auscultation without wheezing, rhonchi or rales. {no increased work of breathing. CV: Normal S1, S2 without murmurs. Abdomen: Nondistended, nontender. Skin: Warm and dry, without lesions or rashes. Extremities:  No clubbing, cyanosis or edema. Neuro:   Grossly intact.  Diagnositics/Labs: None today  Assessment and plan: Allergic rhinitis with conjunctivitis - continue avoidance measures for grass pollen, weed pollen, tree pollen, mold and cockroach.   - for nasal congestion continue Flonase 2 sprays each nostril daily for 1-2 weeks at a time before stopping once nasal congestion improves for maximum benefit - to assess for nasal drainage (that can lead to chronic and persistent cough) try nasal Azelastine 2 sprays each nostril twice a day for next month.  If this helps minimize/improve your cough then you do have component of nasal drainage driving cough.    - if cough is allergen driven then you would likely benefit from course of allergy shots.   - consider performing nasal saline rinses.  This helps to flush out the sinus passages and also helps your  medicated nasal sprays work more effectively.  Use with distilled water or boiled water and bring down to room temperature prior to use.  Breathe through your mouth the entire process.  Rinse kit provided today.  - can also use long-acting antihistamine like Allegra or Xyzal daily as needed - if medication management is not effective then consider allergen immunotherapy which is a 3-5 year therapy to help re-train the body to be not allergic to the environmental allergens above.   Cough, chronic - continue follow-up with Dr. Lamonte Sakai - holding Symbicort for now as per Dr. Lamonte Sakai - have access to albuterol inhaler 2 puffs every 4-6 hours as needed for cough/wheeze/shortness of breath/chest tightness.  May use 15-20 minutes prior to activity.   Monitor frequency of use.     Follow-up in 3-4 months or sooner if needed  I appreciate the opportunity to take part in Taliya's care. Please do not hesitate to contact me with questions.  Sincerely,  Prudy Feeler, MD Allergy/Immunology [facility]

## 2021-05-08 ENCOUNTER — Emergency Department (HOSPITAL_COMMUNITY)
Admission: EM | Admit: 2021-05-08 | Discharge: 2021-05-08 | Disposition: A | Discharge status: Home / Self Care | Payer: Medicare PPO | Attending: Emergency Medicine | Admitting: Emergency Medicine

## 2021-05-08 ENCOUNTER — Other Ambulatory Visit: Payer: Self-pay

## 2021-05-08 ENCOUNTER — Encounter (HOSPITAL_COMMUNITY): Payer: Self-pay | Admitting: Emergency Medicine

## 2021-05-08 DIAGNOSIS — K920 Hematemesis: Secondary | ICD-10-CM | POA: Insufficient documentation

## 2021-05-08 DIAGNOSIS — Z7951 Long term (current) use of inhaled steroids: Secondary | ICD-10-CM | POA: Diagnosis not present

## 2021-05-08 DIAGNOSIS — Z79899 Other long term (current) drug therapy: Secondary | ICD-10-CM | POA: Insufficient documentation

## 2021-05-08 DIAGNOSIS — R1013 Epigastric pain: Secondary | ICD-10-CM | POA: Insufficient documentation

## 2021-05-08 DIAGNOSIS — I10 Essential (primary) hypertension: Secondary | ICD-10-CM | POA: Insufficient documentation

## 2021-05-08 DIAGNOSIS — Z7982 Long term (current) use of aspirin: Secondary | ICD-10-CM | POA: Diagnosis not present

## 2021-05-08 DIAGNOSIS — J4520 Mild intermittent asthma, uncomplicated: Secondary | ICD-10-CM | POA: Diagnosis not present

## 2021-05-08 LAB — COMPREHENSIVE METABOLIC PANEL
ALT: 21 U/L (ref 0–44)
AST: 27 U/L (ref 15–41)
Albumin: 4.2 g/dL (ref 3.5–5.0)
Alkaline Phosphatase: 97 U/L (ref 38–126)
Anion gap: 9 (ref 5–15)
BUN: 14 mg/dL (ref 6–20)
CO2: 31 mmol/L (ref 22–32)
Calcium: 9.9 mg/dL (ref 8.9–10.3)
Chloride: 102 mmol/L (ref 98–111)
Creatinine, Ser: 0.61 mg/dL (ref 0.44–1.00)
GFR, Estimated: >60 mL/min (ref >60)
Glucose, Bld: 129 mg/dL — ABNORMAL HIGH (ref 70–99)
Potassium: 4.3 mmol/L (ref 3.5–5.1)
Sodium: 142 mmol/L (ref 135–145)
Total Bilirubin: 1.1 mg/dL (ref 0.3–1.2)
Total Protein: 8.2 g/dL — ABNORMAL HIGH (ref 6.5–8.1)

## 2021-05-08 LAB — POC OCCULT BLOOD, ED: Fecal Occult Bld: NEGATIVE

## 2021-05-08 LAB — URINALYSIS, ROUTINE W REFLEX MICROSCOPIC
Bilirubin Urine: NEGATIVE
Glucose, UA: NEGATIVE mg/dL
Hgb urine dipstick: NEGATIVE
Ketones, ur: NEGATIVE mg/dL
Leukocytes,Ua: NEGATIVE
Nitrite: NEGATIVE
Protein, ur: NEGATIVE mg/dL
Specific Gravity, Urine: <1.005 — ABNORMAL LOW (ref 1.005–1.030)
pH: 7 (ref 5.0–8.0)

## 2021-05-08 LAB — CBC
HCT: 40.5 % (ref 36.0–46.0)
Hemoglobin: 14.6 g/dL (ref 12.0–15.0)
MCH: 33.2 pg (ref 26.0–34.0)
MCHC: 36 g/dL (ref 30.0–36.0)
MCV: 92 fL (ref 80.0–100.0)
Platelets: 220 10*3/uL (ref 150–400)
RBC: 4.4 MIL/uL (ref 3.87–5.11)
RDW: 14.5 % (ref 11.5–15.5)
WBC: 8.1 10*3/uL (ref 4.0–10.5)
nRBC: 0 % (ref 0.0–0.2)

## 2021-05-08 LAB — LIPASE, BLOOD: Lipase: 48 U/L (ref 11–51)

## 2021-05-08 MED ORDER — LIDOCAINE VISCOUS HCL 2 % MT SOLN: 15.0000 mL | OROMUCOSAL | 0 refills | Status: DC | PRN

## 2021-05-08 MED ORDER — ONDANSETRON 4 MG PO TBDP: 4.0000 mg | ORAL_TABLET | Freq: Three times a day (TID) | ORAL | 0 refills | Status: DC | PRN

## 2021-05-08 MED ORDER — ONDANSETRON 4 MG PO TBDP
4.0000 mg | ORAL_TABLET | Freq: Once | ORAL | Status: AC
  Administered 2021-05-08: 4 mg via ORAL
  Filled 2021-05-08: qty 1

## 2021-05-08 MED ORDER — SUCRALFATE 1 G PO TABS: 1.0000 g | ORAL_TABLET | Freq: Three times a day (TID) | ORAL | 0 refills | Status: DC

## 2021-05-08 MED ORDER — LIDOCAINE VISCOUS HCL 2 % MT SOLN
15.0000 mL | Freq: Once | OROMUCOSAL | Status: AC
  Administered 2021-05-08: 15 mL via ORAL
  Filled 2021-05-08: qty 15

## 2021-05-08 MED ORDER — ALUM & MAG HYDROXIDE-SIMETH 200-200-20 MG/5ML PO SUSP
30.0000 mL | Freq: Once | ORAL | Status: AC
  Administered 2021-05-08: 30 mL via ORAL
  Filled 2021-05-08: qty 30

## 2021-05-08 MED ORDER — ONDANSETRON HCL 4 MG/2ML IJ SOLN: 4.0000 mg | Freq: Once | INTRAMUSCULAR | Status: DC

## 2021-05-08 NOTE — ED Provider Notes (Signed)
[facility]-EMERGENCY DEPT Provider Note   CSN: [id number] Arrival date & time: 05/08/21  0020     History Chief Complaint  Patient presents with   Vomiting    Kathleen Solis is a 55 y.o. female with a history of diverticulitis, carotid arterial disease, CVA, GERD, asthma, hyperlipidemia who presents to the emergency department with a chief complaint of hematemesis.  The patient reports that she had 4 episodes of vomiting containing dark red clots of blood prior to arrival.  No history of similar.  Reports that she is also noticed that her stools have appeared darker in color, but have not been black or tarry.  No vomiting since arrival in the ED.  She also endorses epigastric abdominal pain that radiates through to her back.  She is unable to characterize the pain.  Pain has been constant since tonight.  No known aggravating or alleviating factors.  She denies fever, chills, chest pain, shortness of breath, leg swelling, palpitations, dysuria, hematuria, rash, constipation, diarrhea, dizziness, lightheadedness, or syncope..  She does not drink alcohol.  Denies regular NSAID use.  No history of GI bleed.  She has not established with GI.  Denies eating any red foods or foods with red dye.   The history is provided by the patient and medical records. No language interpreter was used.      Past Medical History:  Diagnosis Date   Allergic rhinitis 08/16/2016   Benign essential hypertension 03/03/2015   Bone spur of right foot 02/08/2016   Bunion of left foot 02/08/2016   Carotid arterial disease 04/10/2016   03/2016: right 40-59%, left < 40%    Chest pain 02/14/2017   Cyst of left ovary 03/03/2015   Diverticulitis of colon without hemorrhage 02/25/2016   Epigastric pain 05/06/2020   Episodic tension-type headache 01/12/2016   Taking motrin 200 mg bid   GERD (gastroesophageal reflux disease)    History of cerebrovascular accident (CVA) with residual deficit 2001    Continue aspirin 81 mg daily, statin and current blood pressure medications; Blood pressure has been well controlled and cholesterol has been at goal   Hyperglycemia 02/14/2017   Chronic; encouraged low sugar/carbohydrate diet and regular exercise   Hyperlipidemia    diet controlled - no med   Leg neuralgia, right    Related to CVA    Lumbar back pain 05/10/2018   Lumbar radiculopathy 05/15/2018   Pain seems consistent with radiculopathy   MCI (mild cognitive impairment) with memory loss 12/31/2020   Midline cystocele    Mild asthma 08/27/2020   Onychomycosis 02/21/2018   Toenails; Ciclopirox prescribed   Sciatica    right   Sinus infection 03/22/2019   SVD (spontaneous vaginal delivery)    x 2   Tinnitus of right ear 11/16/2016   Vitamin B12 deficiency 02/23/2018    Patient Active Problem List   Diagnosis Date Noted   Bilateral non-suppurative otitis media 05/06/2021   Urinary hesitancy 12/31/2020   MCI (mild cognitive impairment) with memory loss - Dr Everlena Cooper 12/31/2020   Midline cystocele    GERD (gastroesophageal reflux disease)    Mild asthma - Dr Delton Coombes 08/27/2020   Chronic cough 03/17/2019   Lumbar radiculopathy 05/15/2018   Lumbar back pain 05/10/2018   Vitamin B12 deficiency 02/23/2018   Tingling 02/21/2018   Onychomycosis 02/21/2018   Hyperlipidemia 02/14/2017   Chest pain 02/14/2017   Hyperglycemia 02/14/2017   Tinnitus of right ear 11/16/2016   Constipation 09/13/2016   Oropharyngeal dysphagia 08/16/2016  Allergic rhinitis 08/16/2016   Carotid arterial disease 04/10/2016   Diverticulitis of colon without hemorrhage 02/25/2016   Bone spur of right foot 02/08/2016   History of cerebrovascular accident (CVA) with residual deficit    Leg neuralgia, right    Episodic tension-type headache 01/12/2016   Benign essential hypertension 03/03/2015   Cyst of left ovary 03/03/2015    Past Surgical History:  Procedure Laterality Date   ABDOMINAL HYSTERECTOMY     partial    ANAL FISSURE REPAIR     APPENDECTOMY     CHOLECYSTECTOMY     CYSTOSCOPY N/A 10/04/2017   Procedure: CYSTOSCOPY;  Surgeon: Gerald Leitz, MD;  Location: [facility];  Service: Gynecology;  Laterality: N/A;   FOOT SURGERY Left    big toe bunion removed   LAPAROSCOPIC BILATERAL SALPINGO OOPHERECTOMY Bilateral 10/04/2017   Procedure: LAPAROSCOPIC BILATERAL SALPINGO OOPHORECTOMY, PELVIC WASHINGS;  Surgeon: Gerald Leitz, MD;  Location: [facility];  Service: Gynecology;  Laterality: Bilateral;   TUBAL LIGATION     WISDOM TOOTH EXTRACTION       OB History     Gravida  4   Para  2   Term  2   Preterm      AB  2   Living  2      SAB  2   IAB      Ectopic      Multiple      Live Births  2           Family History  Problem Relation Age of Onset   Breast cancer Sister    Diabetes Sister    Glaucoma Sister    Cataracts Sister    Hypertension Sister    Anuerysm Mother 23   Pancreatic cancer Father    Retinal detachment Brother    Cataracts Sister    Breast cancer Cousin    Anuerysm Cousin    Breast cancer Niece    Esophageal cancer Nephew    Developmental delay Nephew    Cerebral palsy Son        mild   Colon polyps Neg Hx    Prostate cancer Neg Hx    Rectal cancer Neg Hx    Stomach cancer Neg Hx    Uterine cancer Neg Hx     Social History   Tobacco Use   Smoking status: Never   Smokeless tobacco: Never  Vaping Use   Vaping Use: Never used  Substance Use Topics   Alcohol use: No    Alcohol/week: 0.0 standard drinks   Drug use: No    Home Medications Prior to Admission medications   Medication Sig Start Date End Date Taking? Authorizing Provider  lidocaine (XYLOCAINE) 2 % solution Use as directed 15 mLs in the mouth or throat as needed for mouth pain. 05/08/21  Yes Adalee Kathan A, PA-C  ondansetron (ZOFRAN ODT) 4 MG disintegrating tablet Take 1 tablet (4 mg total) by mouth every 8 (eight) hours as needed. 05/08/21  Yes Aleecia Tapia A, PA-C  sucralfate  (CARAFATE) 1 g tablet Take 1 tablet (1 g total) by mouth 4 (four) times daily -  with meals and at bedtime for 14 days. 05/08/21 05/22/21 Yes Batool Majid A, PA-C  acetaminophen (TYLENOL) 500 MG tablet Take 500 mg by mouth every 6 (six) hours as needed. Patients states she usually takes 2-4 tabs daily    [provider]  aspirin EC 81 MG tablet Take 1 tablet (81 mg total) by mouth  daily. Swallow whole. 02/08/21   Cannon Kettle, PA-C  atorvastatin (LIPITOR) 10 MG tablet Take 1 tablet (10 mg total) by mouth at bedtime. 05/06/21   Pincus Sanes, MD  azelastine (ASTELIN) 0.1 % nasal spray Place 2 sprays into both nostrils 2 (two) times daily. Use in each nostril as directed 05/07/21   Marcelyn Bruins, MD  fluticasone [facility]) 50 MCG/ACT nasal spray Place 2 sprays into both nostrils daily. 04/21/21   Leslye Peer, MD  hydrochlorothiazide (HYDRODIURIL) 25 MG tablet Take 1 tablet (25 mg total) by mouth daily. 05/06/21   Pincus Sanes, MD  metoprolol succinate (TOPROL-XL) 100 MG 24 hr tablet Take 1 tablet (100 mg total) by mouth at bedtime. 05/06/21   Pincus Sanes, MD  nortriptyline (PAMELOR) 10 MG capsule Take 1 capsule (10 mg total) by mouth at bedtime. 05/05/21   Everlena Cooper, Adam R, DO  pantoprazole (PROTONIX) 20 MG tablet Take 2 tablets (40 mg total) by mouth 2 (two) times daily. 04/21/21   Leslye Peer, MD  promethazine (PHENERGAN) 6.25 MG/5ML syrup Take by mouth 2 (two) times daily before a meal.    [provider]  vitamin B-12 (CYANOCOBALAMIN) 1000 MCG tablet Take 1,000 mcg by mouth daily.    [provider]    Allergies    Benzonatate, Darvon [propoxyphene], Ibuprofen, Morphine and related, Prednisone, Sulfa antibiotics, Tussionex pennkinetic er [hydrocod polst-cpm polst er], Buprenorphine hcl, Ivp dye [iodinated diagnostic agents], and Prochlorperazine edisylate  Review of Systems   Review of Systems  Constitutional:  Negative for activity change, chills,  diaphoresis and fever.  HENT:  Negative for congestion and sore throat.   Respiratory:  Negative for shortness of breath and wheezing.   Cardiovascular:  Negative for chest pain and palpitations.  Gastrointestinal:  Positive for abdominal pain, nausea and vomiting. Negative for constipation, diarrhea and rectal pain.  Genitourinary:  Negative for dysuria, enuresis, flank pain, frequency, hematuria, urgency, vaginal bleeding and vaginal pain.  Musculoskeletal:  Positive for back pain. Negative for myalgias, neck pain and neck stiffness.  Skin:  Negative for rash and wound.  Allergic/Immunologic: Negative for immunocompromised state.  Neurological:  Negative for seizures, syncope, weakness, numbness and headaches.  Psychiatric/Behavioral:  Negative for confusion.    Physical Exam Updated Vital Signs BP 122/75   Pulse 61   Temp 97.7 F (36.5 C) (Oral)   Resp 16   Ht 5' 5.5" (1.664 m)   Wt 90.7 kg   SpO2 95%   BMI 32.78 kg/m   Physical Exam Vitals and nursing note reviewed.  Constitutional:      General: She is not in acute distress.    Appearance: She is not ill-appearing, toxic-appearing or diaphoretic.  HENT:     Head: Normocephalic.  Eyes:     Conjunctiva/sclera: Conjunctivae normal.  Cardiovascular:     Rate and Rhythm: Normal rate and regular rhythm.     Heart sounds: No murmur heard.   No friction rub. No gallop.  Pulmonary:     Effort: Pulmonary effort is normal. No respiratory distress.     Breath sounds: No stridor. No wheezing, rhonchi or rales.  Chest:     Chest wall: No tenderness.  Abdominal:     General: There is no distension.     Palpations: Abdomen is soft. There is no mass.     Tenderness: There is abdominal tenderness. There is no right CVA tenderness, left CVA tenderness, guarding or rebound.     Hernia:  No hernia is present.     Comments: Tender to palpation in the epigastric region.  No rebound or guarding.  Abdomen is soft and nondistended.   Negative Murphy sign.  No CVA tenderness bilaterally.  No tenderness over McBurney's point.  Hyperactive bowel sounds in all 4 quadrants.  Genitourinary:    Comments: Chaperoned exam.  Normal rectal tone.  No fissures or hemorrhoids noted to the anus.  On rectal exam, no stool was able to be obtained on first attempt.  Discussed with patient and she was agreeable to reattempt of rectal exam.  Unfortunately, despite second attempt, little to no stool was present in the rectal vault Musculoskeletal:     Cervical back: Neck supple.  Skin:    General: Skin is warm.     Capillary Refill: Capillary refill takes less than 2 seconds.     Findings: No bruising or rash.  Neurological:     General: No focal deficit present.     Mental Status: She is alert.  Psychiatric:        Behavior: Behavior normal.    ED Results / Procedures / Treatments   Labs (all labs ordered are listed, but only abnormal results are displayed) Labs Reviewed  COMPREHENSIVE METABOLIC PANEL - Abnormal; Notable for the following components:      Result Value   Glucose, Bld 129 (*)    Total Protein 8.2 (*)    All other components within normal limits  URINALYSIS, ROUTINE W REFLEX MICROSCOPIC - Abnormal; Notable for the following components:   Color, Urine YELLOW (*)    APPearance CLEAR (*)    Specific Gravity, Urine <1.005 (*)    Bacteria, UA RARE (*)    All other components within normal limits  LIPASE, BLOOD  CBC  POC OCCULT BLOOD, ED    EKG None  Radiology No results found.  Procedures Procedures   Medications Ordered in ED Medications  ondansetron (ZOFRAN-ODT) disintegrating tablet 4 mg (4 mg Oral Given 05/08/21 0427)  alum & mag hydroxide-simeth (MAALOX/MYLANTA) 200-200-20 MG/5ML suspension 30 mL (30 mLs Oral Given 05/08/21 0427)    And  lidocaine (XYLOCAINE) 2 % viscous mouth solution 15 mL (15 mLs Oral Given 05/08/21 0427)    ED Course  I have reviewed the triage vital signs and the nursing  notes.  Pertinent labs & imaging results that were available during my care of the patient were reviewed by me and considered in my medical decision making (see chart for details).    MDM Rules/Calculators/A&P                           55 year old female with a history of diverticulitis, carotid arterial disease, CVA, GERD, asthma, hyperlipidemia who presents to the emergency department with a chief complaint of hematemesis, onset tonight.  She reports associated epigastric pain that radiates through to her back, nausea, and change in color to her stools.   Vital signs are stable.  On exam, she does have tenderness palpation in the epigastric region, but no peritoneal signs.  Negative Murphy sign.  Labs and imaging of been reviewed and independently interpreted by me.  Discussed the patient with Dr. Delford Field, attending physician.  Hemoglobin is normal at 14.6.  No metabolic derangements.  BUN is not elevated.  Urinalysis is unremarkable.  Hemoccult was performed and no stool was able to be obtained on first try.  After discussing with the patient she was agreeable to second attempt,  but minimal to no stool was obtained on second attempt.  I discussed that this would most likely cause Hemoccult test to be positive due to poor sample size.  She was advised to inform RN if she had a bowel movement while she was in the emergency department.  Patient was observed for more than 6 hours.  She had no further episodes of vomiting.  Pain improved with GI cocktail.  She was tolerating fluids without difficulty.  Vital signs have remained stable.  No bowel movements.  Given concern for hematemesis, this could be peptic ulcer disease as patient does have a longstanding history of GERD.  Upon reviewing her chart, it does appear that her home pantoprazole dosage was just recently increased by pulmonology.  Patient was advised to keep this dose of 40 mg daily.  I will also start her on Carafate and viscous  lidocaine.  She has been given a referral to GI.  I did also consider all their causes of her symptoms, including gastritis, that red color could be from another food product or food dye.  Doubt she is confusing hemoptysis so low suspicion for PE, or bronchitis.  Patient reports that she is feeling much improved and she would like to be discharged home.  I think this is reasonable given that she has been stable.  ER return precautions given.  She is hemodynamically stable in no acute distress.  Safer discharge home with outpatient follow-up as discussed.     Final Clinical Impression(s) / ED Diagnoses Final diagnoses:  Hematemesis with nausea  Epigastric abdominal pain    Rx / DC Orders ED Discharge Orders          Ordered    sucralfate (CARAFATE) 1 g tablet  3 times daily with meals & bedtime        05/08/21 0625    lidocaine (XYLOCAINE) 2 % solution  As needed        05/08/21 0625    ondansetron (ZOFRAN ODT) 4 MG disintegrating tablet  Every 8 hours PRN        05/08/21 0626             Frederik Pear A, PA-C 05/08/21 0947    Koleen Distance, MD 05/09/21 [phone]

## 2021-05-08 NOTE — ED Triage Notes (Signed)
Patient presents with vomiting x4 tonight. Patient states that she has had a cough, but tonight began having nausea with x2 episodes of vomiting at home with dark blood. Patient states dark stools, but not tarry. Patient states she vomited x2 on the way here. Patient denies diarrhea, states normal bowel movement. Denies other symptoms.

## 2021-05-08 NOTE — Discharge Instructions (Addendum)
Thank you for allowing me to care for you today in the Emergency Department.   Keep taking the dose of pantoprazole that you were put on by Dr. Delton Coombes --40 mg daily.  Take 1 tablet of Carafate every 6 hours by mouth for the next 2 weeks.  You can swallow 15 mL of viscous lidocaine every 3 hours as needed for pain.  You can let 1 tablet of Zofran dissolve in your tongue every 8 hours as needed for nausea or vomiting.  Please call to schedule a follow-up appointment with gastroenterology.  Their office number is listed above.  Return to the emergency department if you pass out, if you continue to have episodes of bloody vomiting, if you have black tarry stools, if you develop shortness of breath, or other new, concerning symptoms.

## 2021-05-08 NOTE — ED Notes (Signed)
Pt provided with gingerale for PO challenge  

## 2021-05-10 ENCOUNTER — Ambulatory Visit: Payer: Medicare PPO | Admitting: Internal Medicine

## 2021-05-13 ENCOUNTER — Telehealth: Payer: Self-pay | Admitting: Neurology

## 2021-05-13 ENCOUNTER — Telehealth: Payer: Self-pay | Admitting: Emergency Medicine

## 2021-05-13 DIAGNOSIS — Z8669 Personal history of other diseases of the nervous system and sense organs: Secondary | ICD-10-CM

## 2021-05-13 NOTE — Telephone Encounter (Signed)
Pt said she got a phone call from our office. said no one left a message. She wasn't expecting a call

## 2021-05-13 NOTE — Telephone Encounter (Signed)
Please see result note 

## 2021-05-13 NOTE — Telephone Encounter (Signed)
Pt is following up to see if Dr. Everlena Cooper has reached out to you about ordering a sleep study.  RB please advise. Thanks

## 2021-05-13 NOTE — Progress Notes (Signed)
Pt advised of her Lab results. 

## 2021-05-14 NOTE — Telephone Encounter (Signed)
It looks like Dr Everlena Cooper made referral for her to see me to discuss. I would agree with repeating her sleep study. Would go ahead and order a split night study if she agrees.

## 2021-05-14 NOTE — Telephone Encounter (Signed)
Called and spoke with patient. She is aware that RB is ok with ordering a split night study for her. She verbalized understanding about the process of doing the sleep study as well as the insurance authorization.   Order has been placed.   Nothing further needed at time of call.

## 2021-05-18 DIAGNOSIS — Z20822 Contact with and (suspected) exposure to covid-19: Secondary | ICD-10-CM | POA: Diagnosis not present

## 2021-05-18 DIAGNOSIS — Z8616 Personal history of COVID-19: Secondary | ICD-10-CM | POA: Insufficient documentation

## 2021-05-18 DIAGNOSIS — U071 COVID-19: Secondary | ICD-10-CM | POA: Insufficient documentation

## 2021-05-18 HISTORY — DX: Personal history of COVID-19: Z86.16

## 2021-05-18 NOTE — Progress Notes (Signed)
Virtual Visit via Video Note  I connected with Kathleen Solis on 05/18/21 at 10:40 AM EDT by a video enabled telemedicine application and verified that I am speaking with the correct person using two identifiers.   I discussed the limitations of evaluation and management by telemedicine and the availability of in person appointments. The patient expressed understanding and agreed to proceed.  Present for the visit:  Myself, Dr Cheryll Cockayne, Kathleen Solis.  The patient is currently at home and I am in the office.    No referring provider.    History of Present Illness: This is an acute visit for covid  Her son tested positive for COVID monday, 2 days ago.  She tested positive yesterday.  She has not had COVID in the past.  She is experiencing fatigue, weakness, low-grade fever, nasal congestion, sinus pain, cough, abdominal pain and headaches.  She denies any shortness of breath.   She is taking tylenol and promethazine-dm.    She has had COVID vaccines and 1 booster.  Review of Systems  Constitutional:  Positive for malaise/fatigue. Negative for chills and fever (low grade).  HENT:  Positive for congestion and sinus pain. Negative for ear pain and sore throat.   Respiratory:  Positive for cough. Negative for shortness of breath and wheezing.   Cardiovascular:  Negative for chest pain.  Gastrointestinal:  Positive for abdominal pain. Negative for diarrhea and nausea.  Neurological:  Positive for headaches. Negative for dizziness.     Social History   Socioeconomic History   Marital status: Single    Spouse name: Not on file   Number of children: 2   Years of education: 14   Highest education level: Associate degree: academic program  Occupational History   Occupation: Disability  Tobacco Use   Smoking status: Never   Smokeless tobacco: Never  Vaping Use   Vaping Use: Never used  Substance and Sexual Activity   Alcohol use: No    Alcohol/week: 0.0 standard drinks    Drug use: No   Sexual activity: Not on file  Other Topics Concern   Not on file  Social History Narrative   No regular exercise      Pt is right handed, she occasionally drinks tea, walks QOD. She lives with her 72 yr old son, he has mild cerebral palsy.   Social Determinants of Health   Financial Resource Strain: Low Risk    Difficulty of Paying Living Expenses: Not hard at all  Food Insecurity: No Food Insecurity   Worried About Programme researcher, broadcasting/film/video in the Last Year: Never true   Ran Out of Food in the Last Year: Never true  Transportation Needs: No Transportation Needs   Lack of Transportation (Medical): No   Lack of Transportation (Non-Medical): No  Physical Activity: Inactive   Days of Exercise per Week: 0 days   Minutes of Exercise per Session: 0 min  Stress: No Stress Concern Present   Feeling of Stress : Not at all  Social Connections: Moderately Integrated   Frequency of Communication with Friends and Family: More than three times a week   Frequency of Social Gatherings with Friends and Family: More than three times a week   Attends Religious Services: 1 to 4 times per year   Active Member of [employer] or Organizations: Yes   Attends Banker Meetings: 1 to 4 times per year   Marital Status: Never married     Observations/Objective: Appears well in NAD Breathing  normally Intermittent cough that sounds dry Skin appears warm and dry  Assessment and Plan:  See Problem List for Assessment and Plan of chronic medical problems.   Follow Up Instructions:    I discussed the assessment and treatment plan with the patient. The patient was provided an opportunity to ask questions and all were answered. The patient agreed with the plan and demonstrated an understanding of the instructions.   The patient was advised to call back or seek an in-person evaluation if the symptoms worsen or if the condition fails to improve as anticipated.    Pincus Sanes, MD

## 2021-05-19 ENCOUNTER — Encounter: Payer: Self-pay | Admitting: Internal Medicine

## 2021-05-19 ENCOUNTER — Telehealth (INDEPENDENT_AMBULATORY_CARE_PROVIDER_SITE_OTHER): Payer: Medicare PPO | Admitting: Internal Medicine

## 2021-05-19 DIAGNOSIS — U071 COVID-19: Secondary | ICD-10-CM | POA: Diagnosis not present

## 2021-05-19 MED ORDER — MOLNUPIRAVIR EUA 200MG CAPSULE: 4.0000 | ORAL_CAPSULE | Freq: Two times a day (BID) | ORAL | 0 refills | Status: AC

## 2021-05-19 NOTE — Assessment & Plan Note (Signed)
Acute Discussed quarantine recommendations Symptoms mild-moderate.  I do consider her to be at a higher risk of complications given her chronic medical problems and past medical history Start molnupiravir 800 mg bid x 5 days - discussed possible side effects Continue otc cold meds for symptoms Rest, fluids Call with any concerns

## 2021-05-20 ENCOUNTER — Telehealth: Payer: Self-pay | Admitting: Emergency Medicine

## 2021-05-20 NOTE — Telephone Encounter (Signed)
Spoke with pt and verified that pt has capability to complete My Chart video visit which she does. Pt informed that OV was changed pt stated understanding. OV changed to My Chart video. Nothing further needed at this time.

## 2021-05-20 NOTE — Telephone Encounter (Signed)
Yes that's ok

## 2021-05-20 NOTE — Telephone Encounter (Signed)
I called and spoke patient with patient. Patient tested positive for covid on Tuesday and has an appt with Dr. Delton Coombes tomorrow at 915am. Patient is requesting either phone or video visit due to having covid. Will route to Dr. Delton Coombes for recs.  Dr. Delton Coombes. Please advise. Thanks!

## 2021-05-21 ENCOUNTER — Telehealth (INDEPENDENT_AMBULATORY_CARE_PROVIDER_SITE_OTHER): Payer: Medicare PPO | Admitting: Emergency Medicine

## 2021-05-21 ENCOUNTER — Encounter: Payer: Self-pay | Admitting: Emergency Medicine

## 2021-05-21 DIAGNOSIS — U071 COVID-19: Secondary | ICD-10-CM | POA: Diagnosis not present

## 2021-05-21 DIAGNOSIS — J4520 Mild intermittent asthma, uncomplicated: Secondary | ICD-10-CM

## 2021-05-21 DIAGNOSIS — R053 Chronic cough: Secondary | ICD-10-CM | POA: Diagnosis not present

## 2021-05-21 MED ORDER — PROMETHAZINE-CODEINE 6.25-10 MG/5ML PO SYRP: 5.0000 mL | ORAL_SOLUTION | Freq: Four times a day (QID) | ORAL | 0 refills | Status: DC | PRN

## 2021-05-21 NOTE — Assessment & Plan Note (Signed)
On antivirals, completing 5-day course.  Discussed quarantine with her.  She is having persistent symptoms, recommended Tylenol Cold and flu, will send prescription for promethazine plus codeine cough syrup.  Hopefully she will tolerate, she has had nausea with hydrocodone in the past.

## 2021-05-21 NOTE — Assessment & Plan Note (Signed)
She tolerated coming off Symbicort.  Not really using albuterol.  She is having some chest tightness, question cough, question asthma.  I did discuss with her that she is at risk for flaring.  She wants to avoid corticosteroids if possible, has headache with this.  I reviewed the symptoms that would be consistent with an exacerbation of her asthma and told her that it may become necessary to use steroids if she flares.  We would choose an alternative to prednisone since she had headache with this.  She knows to go to the emergency department if she has an acute change.

## 2021-05-21 NOTE — Progress Notes (Signed)
Virtual Visit via Video Note  I connected with Kathleen Solis on 05/21/21 at  9:15 AM EDT by a video enabled telemedicine application and verified that I am speaking with the correct person using two identifiers.  Location: Patient: Home Provider: Office   I discussed the limitations of evaluation and management by telemedicine and the availability of in person appointments. The patient expressed understanding and agreed to proceed.  History of Present Illness: 55 year old woman, never smoker with a history of mild asthma (positive bronchodilator response), allergic rhinitis, chronic cough.  She has positive skin testing, has seen allergy.  She was dx w COVID-19 on 9/27. She started molnupiravir on 9/28.     Observations/Objective: She is experiencing severe fatigue, HA, weakness, nausea. A lot of cough - non-productive. Feels that her breathing is ok but she is starting to get some chest tightness. Has been using some promethazine DM, tylenol. Off Symbicort since our last visit, has not needed her albuterol.  Seems to be tolerating stopping it  Assessment and Plan: COVID On antivirals, completing 5-day course.  Discussed quarantine with her.  She is having persistent symptoms, recommended Tylenol Cold and flu, will send prescription for promethazine plus codeine cough syrup.  Hopefully she will tolerate, she has had nausea with hydrocodone in the past.  Chronic cough Continue her maintenance medications  Mild asthma - Dr Delton Coombes She tolerated coming off Symbicort.  Not really using albuterol.  She is having some chest tightness, question cough, question asthma.  I did discuss with her that she is at risk for flaring.  She wants to avoid corticosteroids if possible, has headache with this.  I reviewed the symptoms that would be consistent with an exacerbation of her asthma and told her that it may become necessary to use steroids if she flares.  We would choose an alternative to prednisone  since she had headache with this.  She knows to go to the emergency department if she has an acute change.   Follow Up Instructions: Follow with Dr Delton Coombes in 1 month    I discussed the assessment and treatment plan with the patient. The patient was provided an opportunity to ask questions and all were answered. The patient agreed with the plan and demonstrated an understanding of the instructions.   The patient was advised to call back or seek an in-person evaluation if the symptoms worsen or if the condition fails to improve as anticipated.  I provided 25 minutes of non-face-to-face time during this encounter.   Leslye Peer, MD

## 2021-05-21 NOTE — Assessment & Plan Note (Signed)
Continue her maintenance medications

## 2021-05-25 ENCOUNTER — Telehealth: Payer: Self-pay | Admitting: Internal Medicine

## 2021-05-25 NOTE — Telephone Encounter (Signed)
Patient says she is still not feeling well since previous visit 09/28.. says she is still having the cough & congestion  Wants provider or nurse to follow up w/ her.. wants to know how long will it take for her to be completely well again or if there is anything she can do?  Please call [phone]

## 2021-05-26 NOTE — Telephone Encounter (Signed)
Patient calling back to check status of previous message (please see below)

## 2021-05-26 NOTE — Telephone Encounter (Signed)
Unfortunately there is not much more she can do except for symptomatic treatment.  Symptoms can last 7-10 days and some may linger longer.  If she does experience any shortness of breath or worsening of her symptoms she can go to an urgent care to be seen in person.

## 2021-05-26 NOTE — Telephone Encounter (Signed)
Spoke with patient today and info given. 

## 2021-05-29 DIAGNOSIS — Z20822 Contact with and (suspected) exposure to covid-19: Secondary | ICD-10-CM | POA: Diagnosis not present

## 2021-06-11 DIAGNOSIS — Z20822 Contact with and (suspected) exposure to covid-19: Secondary | ICD-10-CM | POA: Diagnosis not present

## 2021-06-14 DIAGNOSIS — E669 Obesity, unspecified: Secondary | ICD-10-CM | POA: Diagnosis not present

## 2021-06-14 DIAGNOSIS — N393 Stress incontinence (female) (male): Secondary | ICD-10-CM | POA: Diagnosis not present

## 2021-06-14 DIAGNOSIS — M1990 Unspecified osteoarthritis, unspecified site: Secondary | ICD-10-CM | POA: Diagnosis not present

## 2021-06-14 DIAGNOSIS — G8311 Monoplegia of lower limb affecting right dominant side: Secondary | ICD-10-CM | POA: Diagnosis not present

## 2021-06-14 DIAGNOSIS — E785 Hyperlipidemia, unspecified: Secondary | ICD-10-CM | POA: Diagnosis not present

## 2021-06-14 DIAGNOSIS — J309 Allergic rhinitis, unspecified: Secondary | ICD-10-CM | POA: Diagnosis not present

## 2021-06-14 DIAGNOSIS — G8929 Other chronic pain: Secondary | ICD-10-CM | POA: Diagnosis not present

## 2021-06-14 DIAGNOSIS — I10 Essential (primary) hypertension: Secondary | ICD-10-CM | POA: Diagnosis not present

## 2021-06-14 DIAGNOSIS — Z6833 Body mass index (BMI) 33.0-33.9, adult: Secondary | ICD-10-CM | POA: Diagnosis not present

## 2021-06-16 ENCOUNTER — Telehealth: Payer: Self-pay | Admitting: Internal Medicine

## 2021-06-16 DIAGNOSIS — B349 Viral infection, unspecified: Secondary | ICD-10-CM | POA: Diagnosis not present

## 2021-06-16 DIAGNOSIS — R059 Cough, unspecified: Secondary | ICD-10-CM | POA: Diagnosis not present

## 2021-06-16 DIAGNOSIS — J029 Acute pharyngitis, unspecified: Secondary | ICD-10-CM | POA: Diagnosis not present

## 2021-06-16 DIAGNOSIS — Z03818 Encounter for observation for suspected exposure to other biological agents ruled out: Secondary | ICD-10-CM | POA: Diagnosis not present

## 2021-06-16 NOTE — Telephone Encounter (Signed)
Patient call stating she has a cough, chest pain/congestion, head ache, ane nausea x3d  Patient states she took a a Covid test at [facility] on 06-11-2021 that was neg  Offered patient a virtual visit for today, patient declined  Patient stated that she will go to urgent care

## 2021-06-18 NOTE — Telephone Encounter (Signed)
Patient went to urgent care 06/16/21 (see previous message below)  Says she is still having drainage & throbbing pain from ear  Patient is requesting an antibiotic  Offered patient a VV.. patient declined  Requesting callback from nurse [phone]

## 2021-06-18 NOTE — Telephone Encounter (Signed)
Appointment made

## 2021-06-20 NOTE — Progress Notes (Deleted)
Subjective:    Patient ID: Kathleen Solis, female    DOB: 1966-07-30, 55 y.o.   MRN: [id number]  This visit occurred during the SARS-CoV-2 public health emergency.  Safety protocols were in place, including screening questions prior to the visit, additional usage of staff PPE, and extensive cleaning of exam room while observing appropriate contact time as indicated for disinfecting solutions.    HPI The patient is here for an acute visit.   Drainage and ear pain -    Covid test neg   Medications and allergies reviewed with patient and updated if appropriate.  Patient Active Problem List   Diagnosis Date Noted   COVID 05/18/2021   Bilateral non-suppurative otitis media 05/06/2021   Urinary hesitancy 12/31/2020   MCI (mild cognitive impairment) with memory loss - Dr Everlena Cooper 12/31/2020   Midline cystocele    GERD (gastroesophageal reflux disease)    Mild asthma - Dr Delton Coombes 08/27/2020   Chronic cough 03/17/2019   Lumbar radiculopathy 05/15/2018   Lumbar back pain 05/10/2018   Vitamin B12 deficiency 02/23/2018   Tingling 02/21/2018   Onychomycosis 02/21/2018   Hyperlipidemia 02/14/2017   Chest pain 02/14/2017   Hyperglycemia 02/14/2017   Tinnitus of right ear 11/16/2016   Constipation 09/13/2016   Oropharyngeal dysphagia 08/16/2016   Allergic rhinitis 08/16/2016   Carotid arterial disease 04/10/2016   Diverticulitis of colon without hemorrhage 02/25/2016   Bone spur of right foot 02/08/2016   History of cerebrovascular accident (CVA) with residual deficit    Leg neuralgia, right    Episodic tension-type headache 01/12/2016   Benign essential hypertension 03/03/2015   Cyst of left ovary 03/03/2015    Current Outpatient Medications on File Prior to Visit  Medication Sig Dispense Refill   acetaminophen (TYLENOL) 500 MG tablet Take 500 mg by mouth every 6 (six) hours as needed. Patients states she usually takes 2-4 tabs daily     aspirin EC 81 MG tablet Take 1 tablet  (81 mg total) by mouth daily. Swallow whole. 90 tablet 3   atorvastatin (LIPITOR) 10 MG tablet Take 1 tablet (10 mg total) by mouth at bedtime. 90 tablet 1   azelastine (ASTELIN) 0.1 % nasal spray Place 2 sprays into both nostrils 2 (two) times daily. Use in each nostril as directed 30 mL 5   fluticasone (FLONASE) 50 MCG/ACT nasal spray Place 2 sprays into both nostrils daily. 16 g 6   hydrochlorothiazide (HYDRODIURIL) 25 MG tablet Take 1 tablet (25 mg total) by mouth daily. 90 tablet 1   lidocaine (XYLOCAINE) 2 % solution Use as directed 15 mLs in the mouth or throat as needed for mouth pain. 100 mL 0   metoprolol succinate (TOPROL-XL) 100 MG 24 hr tablet Take 1 tablet (100 mg total) by mouth at bedtime. 90 tablet 1   nortriptyline (PAMELOR) 10 MG capsule Take 1 capsule (10 mg total) by mouth at bedtime. 30 capsule 5   ondansetron (ZOFRAN ODT) 4 MG disintegrating tablet Take 1 tablet (4 mg total) by mouth every 8 (eight) hours as needed. 20 tablet 0   pantoprazole (PROTONIX) 20 MG tablet Take 2 tablets (40 mg total) by mouth 2 (two) times daily.     promethazine (PHENERGAN) 6.25 MG/5ML syrup Take by mouth 2 (two) times daily before a meal.     promethazine-codeine (PHENERGAN WITH CODEINE) 6.25-10 MG/5ML syrup Take 5 mLs by mouth every 6 (six) hours as needed for cough. 240 mL 0   sucralfate (CARAFATE) 1 g tablet  Take 1 tablet (1 g total) by mouth 4 (four) times daily -  with meals and at bedtime for 14 days. 56 tablet 0   vitamin B-12 (CYANOCOBALAMIN) 1000 MCG tablet Take 1,000 mcg by mouth daily.     No current facility-administered medications on file prior to visit.    Past Medical History:  Diagnosis Date   Allergic rhinitis 08/16/2016   Benign essential hypertension 03/03/2015   Bone spur of right foot 02/08/2016   Bunion of left foot 02/08/2016   Carotid arterial disease 04/10/2016   03/2016: right 40-59%, left < 40%    Chest pain 02/14/2017   Cyst of left ovary 03/03/2015    Diverticulitis of colon without hemorrhage 02/25/2016   Epigastric pain 05/06/2020   Episodic tension-type headache 01/12/2016   Taking motrin 200 mg bid   GERD (gastroesophageal reflux disease)    History of cerebrovascular accident (CVA) with residual deficit 2001   Continue aspirin 81 mg daily, statin and current blood pressure medications; Blood pressure has been well controlled and cholesterol has been at goal   Hyperglycemia 02/14/2017   Chronic; encouraged low sugar/carbohydrate diet and regular exercise   Hyperlipidemia    diet controlled - no med   Leg neuralgia, right    Related to CVA    Lumbar back pain 05/10/2018   Lumbar radiculopathy 05/15/2018   Pain seems consistent with radiculopathy   MCI (mild cognitive impairment) with memory loss 12/31/2020   Midline cystocele    Mild asthma 08/27/2020   Onychomycosis 02/21/2018   Toenails; Ciclopirox prescribed   Sciatica    right   Sinus infection 03/22/2019   SVD (spontaneous vaginal delivery)    x 2   Tinnitus of right ear 11/16/2016   Vitamin B12 deficiency 02/23/2018    Past Surgical History:  Procedure Laterality Date   ABDOMINAL HYSTERECTOMY     partial   ANAL FISSURE REPAIR     APPENDECTOMY     CHOLECYSTECTOMY     CYSTOSCOPY N/A 10/04/2017   Procedure: CYSTOSCOPY;  Surgeon: Gerald Leitz, MD;  Location: [facility];  Service: Gynecology;  Laterality: N/A;   FOOT SURGERY Left    big toe bunion removed   LAPAROSCOPIC BILATERAL SALPINGO OOPHERECTOMY Bilateral 10/04/2017   Procedure: LAPAROSCOPIC BILATERAL SALPINGO OOPHORECTOMY, PELVIC WASHINGS;  Surgeon: Gerald Leitz, MD;  Location: [facility];  Service: Gynecology;  Laterality: Bilateral;   TUBAL LIGATION     WISDOM TOOTH EXTRACTION      Social History   Socioeconomic History   Marital status: Single    Spouse name: Not on file   Number of children: 2   Years of education: 14   Highest education level: Associate degree: academic program  Occupational History   Occupation:  Disability  Tobacco Use   Smoking status: Never   Smokeless tobacco: Never  Vaping Use   Vaping Use: Never used  Substance and Sexual Activity   Alcohol use: No    Alcohol/week: 0.0 standard drinks   Drug use: No   Sexual activity: Not on file  Other Topics Concern   Not on file  Social History Narrative   No regular exercise      Pt is right handed, she occasionally drinks tea, walks QOD. She lives with her 77 yr old son, he has mild cerebral palsy.   Social Determinants of Health   Financial Resource Strain: Low Risk    Difficulty of Paying Living Expenses: Not hard at all  Food Insecurity: No Food Insecurity  Worried About Programme researcher, broadcasting/film/video in the Last Year: Never true   [employer] of Food in the Last Year: Never true  Transportation Needs: No Transportation Needs   Lack of Transportation (Medical): No   Lack of Transportation (Non-Medical): No  Physical Activity: Inactive   Days of Exercise per Week: 0 days   Minutes of Exercise per Session: 0 min  Stress: No Stress Concern Present   Feeling of Stress : Not at all  Social Connections: Moderately Integrated   Frequency of Communication with Friends and Family: More than three times a week   Frequency of Social Gatherings with Friends and Family: More than three times a week   Attends Religious Services: 1 to 4 times per year   Active Member of [employer] or Organizations: Yes   Attends Banker Meetings: 1 to 4 times per year   Marital Status: Never married    Family History  Problem Relation Age of Onset   Breast cancer Sister    Diabetes Sister    Glaucoma Sister    Cataracts Sister    Hypertension Sister    Anuerysm Mother 22   Pancreatic cancer Father    Retinal detachment Brother    Cataracts Sister    Breast cancer Cousin    Anuerysm Cousin    Breast cancer Niece    Esophageal cancer Nephew    Developmental delay Nephew    Cerebral palsy Son        mild   Colon polyps Neg Hx    Prostate  cancer Neg Hx    Rectal cancer Neg Hx    Stomach cancer Neg Hx    Uterine cancer Neg Hx     Review of Systems     Objective:  There were no vitals filed for this visit. BP Readings from Last 3 Encounters:  05/08/21 122/75  05/07/21 126/78  05/06/21 108/70   Wt Readings from Last 3 Encounters:  05/08/21 200 lb (90.7 kg)  05/07/21 202 lb 8 oz (91.9 kg)  05/06/21 203 lb (92.1 kg)   There is no height or weight on file to calculate BMI.   Physical Exam    GENERAL APPEARANCE: Appears stated age, well appearing, NAD EYES: conjunctiva clear, no icterus HENT: bilateral tympanic membranes and ear canals normal, oropharynx with no erythema or exudates, trachea midline, no cervical or supraclavicular lymphadenopathy LUNGS: Unlabored breathing, good air entry bilaterally, clear to auscultation without wheeze or crackles CARDIOVASCULAR: Normal S1,S2 , no edema SKIN: Warm, dry      Assessment & Plan:    See Problem List for Assessment and Plan of chronic medical problems.

## 2021-06-21 ENCOUNTER — Ambulatory Visit: Payer: Medicare PPO | Admitting: Internal Medicine

## 2021-06-30 ENCOUNTER — Other Ambulatory Visit: Payer: Self-pay | Admitting: Obstetrics and Gynecology

## 2021-06-30 DIAGNOSIS — R6883 Chills (without fever): Secondary | ICD-10-CM | POA: Diagnosis not present

## 2021-06-30 DIAGNOSIS — R059 Cough, unspecified: Secondary | ICD-10-CM | POA: Diagnosis not present

## 2021-06-30 DIAGNOSIS — Z1231 Encounter for screening mammogram for malignant neoplasm of breast: Secondary | ICD-10-CM

## 2021-07-01 ENCOUNTER — Telehealth: Payer: Self-pay | Admitting: Emergency Medicine

## 2021-07-05 ENCOUNTER — Encounter (HOSPITAL_BASED_OUTPATIENT_CLINIC_OR_DEPARTMENT_OTHER): Payer: Medicare PPO | Admitting: Pulmonary Disease

## 2021-07-05 NOTE — Telephone Encounter (Signed)
Pt's split night is scheduled for 12/22 and looks like per note on appt auth ends 12/14.  I left Libby a vm to please check auth.

## 2021-07-06 NOTE — Telephone Encounter (Signed)
Auth# [id number] has been extended from 08/12/21-09/11/21 pt is aware Tobe Sos

## 2021-07-26 ENCOUNTER — Encounter: Payer: Self-pay | Admitting: Neurology

## 2021-08-02 ENCOUNTER — Other Ambulatory Visit: Payer: Self-pay | Admitting: Emergency Medicine

## 2021-08-03 ENCOUNTER — Telehealth: Payer: Self-pay | Admitting: Emergency Medicine

## 2021-08-03 MED ORDER — PROMETHAZINE-CODEINE 6.25-10 MG/5ML PO SYRP: 5.0000 mL | ORAL_SOLUTION | Freq: Four times a day (QID) | ORAL | 0 refills | Status: DC | PRN

## 2021-08-03 NOTE — Telephone Encounter (Signed)
Called and spoke with pt who is requesting to have her Promethazine DM cough syrup refilled.  Dr. Delton Coombes, please advise if you are okay refilling Rx for pt. [facility] of [address].

## 2021-08-03 NOTE — Telephone Encounter (Signed)
Let her know I refilled it 

## 2021-08-04 NOTE — Telephone Encounter (Signed)
I spoke with the pt and notified rx was sent  Nothing further needed

## 2021-08-06 ENCOUNTER — Ambulatory Visit: Payer: Medicare PPO | Admitting: Allergy

## 2021-08-06 ENCOUNTER — Encounter: Payer: Self-pay | Admitting: Emergency Medicine

## 2021-08-06 ENCOUNTER — Ambulatory Visit (INDEPENDENT_AMBULATORY_CARE_PROVIDER_SITE_OTHER): Payer: Medicare PPO | Admitting: Emergency Medicine

## 2021-08-06 ENCOUNTER — Other Ambulatory Visit: Payer: Self-pay

## 2021-08-06 VITALS — BP 132/80 | HR 65 | Temp 98.8°F | Ht 65.5 in | Wt 196.2 lb

## 2021-08-06 DIAGNOSIS — R053 Chronic cough: Secondary | ICD-10-CM | POA: Diagnosis not present

## 2021-08-06 DIAGNOSIS — R5383 Other fatigue: Secondary | ICD-10-CM | POA: Diagnosis not present

## 2021-08-06 DIAGNOSIS — G4733 Obstructive sleep apnea (adult) (pediatric): Secondary | ICD-10-CM | POA: Insufficient documentation

## 2021-08-06 HISTORY — DX: Obstructive sleep apnea (adult) (pediatric): G47.33

## 2021-08-06 MED ORDER — PROMETHAZINE-DM 6.25-15 MG/5ML PO SYRP: 5.0000 mL | ORAL_SOLUTION | Freq: Four times a day (QID) | ORAL | 0 refills | Status: DC | PRN

## 2021-08-06 NOTE — Progress Notes (Signed)
° °Subjective:  ° ° Patient ID: Kathleen Solis, female    DOB: 09/07/1965, 55 y.o.   MRN: [id number] ° °HPI ° °ROV 08/27/20 --55-year-old woman with hypertension, history of TIA, allergic rhinitis and GERD.  She been dealing with chronic cough due to the above.  She also has some mild obstruction on spirometry.  We were going Symbicort because it was not preferred by her insurance - but she was able to get it covered.  She has used promethazine DM for cough suppression, uses it at bedtime. Currently on flonase, has not been helped by antihistamine before. She underwent skin testing, sensitive to to multiple pollens, mold, cockroaches. She started [place], did not start allegra or dymista or eyedrops that were discussed in her notes. Overall her breathing and functional capacity is good. She does a lot of throat clearing, using ginger hard candies. She does not feel any GERD on the omeprazole bid. She is out of the promethazine, doesn't get benefit from delsym. She wants to avoid codeine.  ° °ROV 08/06/21 --follow-up visit 55-year-old woman for chronic cough, mild obstructive lung disease by spirometry consistent with mild intermittent asthma.  She has a history of hypertension, TIA, allergic rhinitis, GERD.  She is sensitive to multiple antigens based on her skin testing.  She had COVID-19 in late September 2022.  We had discussed possibly getting a sleep study > scheduled for 12/22.  °Today she reports that her COVID sx were very mild > fatigue. She has chronic cough but it didn't really change. She is on flonase, astelin, has stable congestion and drainage daily. She did a trial of PPI, but she came off of it after ineffective. No real change in the cough. She is off Symbicort - ineffective. She rarely needs albuterol.  ° ° °Review of Systems  °Constitutional:  Negative for fever and unexpected weight change.  °HENT:  Negative for congestion, dental problem, ear pain, nosebleeds, postnasal drip, rhinorrhea, sinus  pressure, sneezing, sore throat and trouble swallowing.   °Eyes:  Negative for redness and itching.  °Respiratory:  Positive for cough. Negative for chest tightness, shortness of breath and wheezing.   °Cardiovascular:  Negative for palpitations and leg swelling.  °Gastrointestinal:  Negative for nausea and vomiting.  °Genitourinary:  Negative for dysuria.  °Musculoskeletal:  Negative for joint swelling.  °Skin:  Negative for rash.  °Neurological:  Negative for headaches.  °Hematological:  Does not bruise/bleed easily.  °Psychiatric/Behavioral:  Negative for dysphoric mood. The patient is not nervous/anxious.   ° ° °Past Medical History:  °Diagnosis Date  ° Allergic rhinitis 08/16/2016  ° Benign essential hypertension 03/03/2015  ° Bone spur of right foot 02/08/2016  ° Bunion of left foot 02/08/2016  ° Carotid arterial disease 04/10/2016  ° 03/2016: right 40-59%, left < 40%   ° Chest pain 02/14/2017  ° Cyst of left ovary 03/03/2015  ° Diverticulitis of colon without hemorrhage 02/25/2016  ° Epigastric pain 05/06/2020  ° Episodic tension-type headache 01/12/2016  ° Taking motrin 200 mg bid  ° GERD (gastroesophageal reflux disease)   ° History of cerebrovascular accident (CVA) with residual deficit 2001  ° Continue aspirin 81 mg daily, statin and current blood pressure medications; Blood pressure has been well controlled and cholesterol has been at goal  ° Hyperglycemia 02/14/2017  ° Chronic; encouraged low sugar/carbohydrate diet and regular exercise  ° Hyperlipidemia   ° diet controlled - no med  ° Leg neuralgia, right   ° Related to CVA   °   Lumbar back pain 05/10/2018   Lumbar radiculopathy 05/15/2018   Pain seems consistent with radiculopathy   MCI (mild cognitive impairment) with memory loss 12/31/2020   Midline cystocele    Mild asthma 08/27/2020   Onychomycosis 02/21/2018   Toenails; Ciclopirox prescribed   Sciatica    right   Sinus infection 03/22/2019   SVD (spontaneous vaginal delivery)    x 2   Tinnitus of  right ear 11/16/2016   Vitamin B12 deficiency 02/23/2018     Family History  Problem Relation Age of Onset   Breast cancer Sister    Diabetes Sister    Glaucoma Sister    Cataracts Sister    Hypertension Sister    Anuerysm Mother 62   Pancreatic cancer Father    Retinal detachment Brother    Cataracts Sister    Breast cancer Cousin    Anuerysm Cousin    Breast cancer Niece    Esophageal cancer Nephew    Developmental delay Nephew    Cerebral palsy Son        mild   Colon polyps Neg Hx    Prostate cancer Neg Hx    Rectal cancer Neg Hx    Stomach cancer Neg Hx    Uterine cancer Neg Hx      Social History   Socioeconomic History   Marital status: Single    Spouse name: Not on file   Number of children: 2   Years of education: 14   Highest education level: Associate degree: academic program  Occupational History   Occupation: Disability  Tobacco Use   Smoking status: Never   Smokeless tobacco: Never  Vaping Use   Vaping Use: Never used  Substance and Sexual Activity   Alcohol use: No    Alcohol/week: 0.0 standard drinks   Drug use: No   Sexual activity: Not on file  Other Topics Concern   Not on file  Social History Narrative   No regular exercise      Pt is right handed, she occasionally drinks tea, walks QOD. She lives with her 9 yr old son, he has mild cerebral palsy.   Social Determinants of Health   Financial Resource Strain: Low Risk    Difficulty of Paying Living Expenses: Not hard at all  Food Insecurity: No Food Insecurity   Worried About Programme researcher, broadcasting/film/video in the Last Year: Never true   Ran Out of Food in the Last Year: Never true  Transportation Needs: No Transportation Needs   Lack of Transportation (Medical): No   Lack of Transportation (Non-Medical): No  Physical Activity: Inactive   Days of Exercise per Week: 0 days   Minutes of Exercise per Session: 0 min  Stress: No Stress Concern Present   Feeling of Stress : Not at all  Social  Connections: Moderately Integrated   Frequency of Communication with Friends and Family: More than three times a week   Frequency of Social Gatherings with Friends and Family: More than three times a week   Attends Religious Services: 1 to 4 times per year   Active Member of [employer] or Organizations: Yes   Attends Banker Meetings: 1 to 4 times per year   Marital Status: Never married  Catering manager Violence: Not At Risk   Fear of Current or Ex-Partner: No   Emotionally Abused: No   Physically Abused: No   Sexually Abused: No  has worked office work, 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 [place]  Allergies  Allergen Reactions   Benzonatate Other (See Comments)    Headaches   Darvon [Propoxyphene] Other (See Comments)    Gi problems   Ibuprofen     tinnitus   Morphine And Related Itching   Prednisone Other (See Comments)    Make her head hurt   Sulfa Antibiotics Hives   Tussionex Pennkinetic Er [Hydrocod Polst-Cpm Polst Er] Other (See Comments)    Nausea, vomiting   Buprenorphine Hcl Itching   Ivp Dye [Iodinated Diagnostic Agents] Nausea And Vomiting   Prochlorperazine Edisylate Itching and Other (See Comments)    "Acts weird: Hallucinations"     Outpatient Medications Prior to Visit  Medication Sig Dispense Refill   acetaminophen (TYLENOL) 500 MG tablet Take 500 mg by mouth every 6 (six) hours as needed. Patients states she usually takes 2-4 tabs daily     aspirin EC 81 MG tablet Take 1 tablet (81 mg total) by mouth daily. Swallow whole. 90 tablet 3   atorvastatin (LIPITOR) 10 MG tablet Take 1 tablet (10 mg total) by mouth at bedtime. 90 tablet 1   azelastine (ASTELIN) 0.1 % nasal spray Place 2 sprays into both nostrils 2 (two) times daily. Use in each nostril as directed 30 mL 5   fluticasone (FLONASE) 50 MCG/ACT nasal spray Place 2 sprays into both nostrils daily. 16 g 6   hydrochlorothiazide (HYDRODIURIL) 25 MG tablet Take 1 tablet (25 mg total) by mouth daily. 90 tablet 1    lidocaine (XYLOCAINE) 2 % solution Use as directed 15 mLs in the mouth or throat as needed for mouth pain. 100 mL 0   metoprolol succinate (TOPROL-XL) 100 MG 24 hr tablet Take 1 tablet (100 mg total) by mouth at bedtime. 90 tablet 1   nortriptyline (PAMELOR) 10 MG capsule Take 1 capsule (10 mg total) by mouth at bedtime. 30 capsule 5   ondansetron (ZOFRAN ODT) 4 MG disintegrating tablet Take 1 tablet (4 mg total) by mouth every 8 (eight) hours as needed. 20 tablet 0   promethazine (PHENERGAN) 6.25 MG/5ML syrup Take by mouth 2 (two) times daily before a meal.     vitamin B-12 (CYANOCOBALAMIN) 1000 MCG tablet Take 1,000 mcg by mouth daily.     promethazine-codeine (PHENERGAN WITH CODEINE) 6.25-10 MG/5ML syrup Take 5 mLs by mouth every 6 (six) hours as needed for cough. 240 mL 0   pantoprazole (PROTONIX) 20 MG tablet Take 2 tablets (40 mg total) by mouth 2 (two) times daily. (Patient not taking: Reported on 08/06/2021)     sucralfate (CARAFATE) 1 g tablet Take 1 tablet (1 g total) by mouth 4 (four) times daily -  with meals and at bedtime for 14 days. 56 tablet 0   No facility-administered medications prior to visit.        Objective:   Physical Exam Vitals:   08/06/21 1039  BP: 132/80  Pulse: 65  Temp: 98.8 F (37.1 C)  TempSrc: Oral  SpO2: 96%  Weight: 196 lb 3.2 oz (89 kg)  Height: 5' 5.5" (1.664 m)   Gen: Pleasant, well-nourished, in no distress,  normal affect  ENT: No lesions,  mouth clear,  oropharynx clear, no postnasal drip, strong voice, throat clearing  Neck: No JVD, no stridor  Lungs: No use of accessory muscles, no crackles or wheezing on normal respiration, no wheeze on forced expiration  Cardiovascular: RRR, heart sounds normal, no murmur or gallops, no peripheral edema  Musculoskeletal: No deformities, no cyanosis or clubbing  Neuro: alert, awake, non  focal  Skin: Warm, no lesions or rash      Assessment & Plan:  Chronic cough Please continue your  fluticasone nasal spray, Astelin nasal spray as you have been taking them We will refill your promethazine/DM cough syrup.  Use 5 cc up to every 6 hours if needed for cough suppression. We will not restart pantoprazole at this time We will arrange for bronchoscopy and airway inspection in January Okay to keep albuterol available use 2 puffs when needed for shortness of breath, chest tightness, wheezing. Follow with Dr Delton Coombes in January after your bronchoscopy and sleep study so we can discuss results.  Fatigue Agree with getting your sleep study 08/12/2021.  We will follow-up to review the results and discuss possible treatment of sleep apnea  Levy Pupa, MD, PhD 08/06/2021, 11:19 AM [facility] [phone] or if no answer [phone]

## 2021-08-06 NOTE — Assessment & Plan Note (Signed)
Please continue your fluticasone nasal spray, Astelin nasal spray as you have been taking them We will refill your promethazine/DM cough syrup.  Use 5 cc up to every 6 hours if needed for cough suppression. We will not restart pantoprazole at this time We will arrange for bronchoscopy and airway inspection in January Okay to keep albuterol available use 2 puffs when needed for shortness of breath, chest tightness, wheezing. Follow with Dr Delton Coombes in January after your bronchoscopy and sleep study so we can discuss results.

## 2021-08-06 NOTE — Assessment & Plan Note (Signed)
Agree with getting your sleep study 08/12/2021.  We will follow-up to review the results and discuss possible treatment of sleep apnea

## 2021-08-06 NOTE — H&P (View-Only) (Signed)
Subjective:    Patient ID: Kathleen Solis, female    DOB: 05-09-1966, 55 y.o.   MRN: [id number]  HPI  ROV 08/27/20 --55 year old woman with hypertension, history of TIA, allergic rhinitis and GERD.  She been dealing with chronic cough due to the above.  She also has some mild obstruction on spirometry.  We were going Symbicort because it was not preferred by her insurance - but she was able to get it covered.  She has used promethazine DM for cough suppression, uses it at bedtime. Currently on flonase, has not been helped by antihistamine before. She underwent skin testing, sensitive to to multiple pollens, mold, cockroaches. She started [place], did not start allegra or dymista or eyedrops that were discussed in her notes. Overall her breathing and functional capacity is good. She does a lot of throat clearing, using ginger hard candies. She does not feel any GERD on the omeprazole bid. She is out of the promethazine, doesn't get benefit from delsym. She wants to avoid codeine.   ROV 08/06/21 --follow-up visit 55 year old woman for chronic cough, mild obstructive lung disease by spirometry consistent with mild intermittent asthma.  She has a history of hypertension, TIA, allergic rhinitis, GERD.  She is sensitive to multiple antigens based on her skin testing.  She had COVID-19 in late September 2022.  We had discussed possibly getting a sleep study > scheduled for 12/22.  Today she reports that her COVID sx were very mild > fatigue. She has chronic cough but it didn't really change. She is on flonase, astelin, has stable congestion and drainage daily. She did a trial of PPI, but she came off of it after ineffective. No real change in the cough. She is off Symbicort - ineffective. She rarely needs albuterol.    Review of Systems  Constitutional:  Negative for fever and unexpected weight change.  HENT:  Negative for congestion, dental problem, ear pain, nosebleeds, postnasal drip, rhinorrhea, sinus  pressure, sneezing, sore throat and trouble swallowing.   Eyes:  Negative for redness and itching.  Respiratory:  Positive for cough. Negative for chest tightness, shortness of breath and wheezing.   Cardiovascular:  Negative for palpitations and leg swelling.  Gastrointestinal:  Negative for nausea and vomiting.  Genitourinary:  Negative for dysuria.  Musculoskeletal:  Negative for joint swelling.  Skin:  Negative for rash.  Neurological:  Negative for headaches.  Hematological:  Does not bruise/bleed easily.  Psychiatric/Behavioral:  Negative for dysphoric mood. The patient is not nervous/anxious.     Past Medical History:  Diagnosis Date   Allergic rhinitis 08/16/2016   Benign essential hypertension 03/03/2015   Bone spur of right foot 02/08/2016   Bunion of left foot 02/08/2016   Carotid arterial disease 04/10/2016   03/2016: right 40-59%, left < 40%    Chest pain 02/14/2017   Cyst of left ovary 03/03/2015   Diverticulitis of colon without hemorrhage 02/25/2016   Epigastric pain 05/06/2020   Episodic tension-type headache 01/12/2016   Taking motrin 200 mg bid   GERD (gastroesophageal reflux disease)    History of cerebrovascular accident (CVA) with residual deficit 2001   Continue aspirin 81 mg daily, statin and current blood pressure medications; Blood pressure has been well controlled and cholesterol has been at goal   Hyperglycemia 02/14/2017   Chronic; encouraged low sugar/carbohydrate diet and regular exercise   Hyperlipidemia    diet controlled - no med   Leg neuralgia, right    Related to CVA  Lumbar back pain 05/10/2018   Lumbar radiculopathy 05/15/2018   Pain seems consistent with radiculopathy   MCI (mild cognitive impairment) with memory loss 12/31/2020   Midline cystocele    Mild asthma 08/27/2020   Onychomycosis 02/21/2018   Toenails; Ciclopirox prescribed   Sciatica    right   Sinus infection 03/22/2019   SVD (spontaneous vaginal delivery)    x 2   Tinnitus of  right ear 11/16/2016   Vitamin B12 deficiency 02/23/2018     Family History  Problem Relation Age of Onset   Breast cancer Sister    Diabetes Sister    Glaucoma Sister    Cataracts Sister    Hypertension Sister    Anuerysm Mother 62   Pancreatic cancer Father    Retinal detachment Brother    Cataracts Sister    Breast cancer Cousin    Anuerysm Cousin    Breast cancer Niece    Esophageal cancer Nephew    Developmental delay Nephew    Cerebral palsy Son        mild   Colon polyps Neg Hx    Prostate cancer Neg Hx    Rectal cancer Neg Hx    Stomach cancer Neg Hx    Uterine cancer Neg Hx      Social History   Socioeconomic History   Marital status: Single    Spouse name: Not on file   Number of children: 2   Years of education: 14   Highest education level: Associate degree: academic program  Occupational History   Occupation: Disability  Tobacco Use   Smoking status: Never   Smokeless tobacco: Never  Vaping Use   Vaping Use: Never used  Substance and Sexual Activity   Alcohol use: No    Alcohol/week: 0.0 standard drinks   Drug use: No   Sexual activity: Not on file  Other Topics Concern   Not on file  Social History Narrative   No regular exercise      Pt is right handed, she occasionally drinks tea, walks QOD. She lives with her 9 yr old son, he has mild cerebral palsy.   Social Determinants of Health   Financial Resource Strain: Low Risk    Difficulty of Paying Living Expenses: Not hard at all  Food Insecurity: No Food Insecurity   Worried About Programme researcher, broadcasting/film/video in the Last Year: Never true   Ran Out of Food in the Last Year: Never true  Transportation Needs: No Transportation Needs   Lack of Transportation (Medical): No   Lack of Transportation (Non-Medical): No  Physical Activity: Inactive   Days of Exercise per Week: 0 days   Minutes of Exercise per Session: 0 min  Stress: No Stress Concern Present   Feeling of Stress : Not at all  Social  Connections: Moderately Integrated   Frequency of Communication with Friends and Family: More than three times a week   Frequency of Social Gatherings with Friends and Family: More than three times a week   Attends Religious Services: 1 to 4 times per year   Active Member of [employer] or Organizations: Yes   Attends Banker Meetings: 1 to 4 times per year   Marital Status: Never married  Catering manager Violence: Not At Risk   Fear of Current or Ex-Partner: No   Emotionally Abused: No   Physically Abused: No   Sexually Abused: No  has worked office work, 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 [place]  Allergies  Allergen Reactions   Benzonatate Other (See Comments)    Headaches   Darvon [Propoxyphene] Other (See Comments)    Gi problems   Ibuprofen     tinnitus   Morphine And Related Itching   Prednisone Other (See Comments)    Make her head hurt   Sulfa Antibiotics Hives   Tussionex Pennkinetic Er [Hydrocod Polst-Cpm Polst Er] Other (See Comments)    Nausea, vomiting   Buprenorphine Hcl Itching   Ivp Dye [Iodinated Diagnostic Agents] Nausea And Vomiting   Prochlorperazine Edisylate Itching and Other (See Comments)    "Acts weird: Hallucinations"     Outpatient Medications Prior to Visit  Medication Sig Dispense Refill   acetaminophen (TYLENOL) 500 MG tablet Take 500 mg by mouth every 6 (six) hours as needed. Patients states she usually takes 2-4 tabs daily     aspirin EC 81 MG tablet Take 1 tablet (81 mg total) by mouth daily. Swallow whole. 90 tablet 3   atorvastatin (LIPITOR) 10 MG tablet Take 1 tablet (10 mg total) by mouth at bedtime. 90 tablet 1   azelastine (ASTELIN) 0.1 % nasal spray Place 2 sprays into both nostrils 2 (two) times daily. Use in each nostril as directed 30 mL 5   fluticasone (FLONASE) 50 MCG/ACT nasal spray Place 2 sprays into both nostrils daily. 16 g 6   hydrochlorothiazide (HYDRODIURIL) 25 MG tablet Take 1 tablet (25 mg total) by mouth daily. 90 tablet 1    lidocaine (XYLOCAINE) 2 % solution Use as directed 15 mLs in the mouth or throat as needed for mouth pain. 100 mL 0   metoprolol succinate (TOPROL-XL) 100 MG 24 hr tablet Take 1 tablet (100 mg total) by mouth at bedtime. 90 tablet 1   nortriptyline (PAMELOR) 10 MG capsule Take 1 capsule (10 mg total) by mouth at bedtime. 30 capsule 5   ondansetron (ZOFRAN ODT) 4 MG disintegrating tablet Take 1 tablet (4 mg total) by mouth every 8 (eight) hours as needed. 20 tablet 0   promethazine (PHENERGAN) 6.25 MG/5ML syrup Take by mouth 2 (two) times daily before a meal.     vitamin B-12 (CYANOCOBALAMIN) 1000 MCG tablet Take 1,000 mcg by mouth daily.     promethazine-codeine (PHENERGAN WITH CODEINE) 6.25-10 MG/5ML syrup Take 5 mLs by mouth every 6 (six) hours as needed for cough. 240 mL 0   pantoprazole (PROTONIX) 20 MG tablet Take 2 tablets (40 mg total) by mouth 2 (two) times daily. (Patient not taking: Reported on 08/06/2021)     sucralfate (CARAFATE) 1 g tablet Take 1 tablet (1 g total) by mouth 4 (four) times daily -  with meals and at bedtime for 14 days. 56 tablet 0   No facility-administered medications prior to visit.        Objective:   Physical Exam Vitals:   08/06/21 1039  BP: 132/80  Pulse: 65  Temp: 98.8 F (37.1 C)  TempSrc: Oral  SpO2: 96%  Weight: 196 lb 3.2 oz (89 kg)  Height: 5' 5.5" (1.664 m)   Gen: Pleasant, well-nourished, in no distress,  normal affect  ENT: No lesions,  mouth clear,  oropharynx clear, no postnasal drip, strong voice, throat clearing  Neck: No JVD, no stridor  Lungs: No use of accessory muscles, no crackles or wheezing on normal respiration, no wheeze on forced expiration  Cardiovascular: RRR, heart sounds normal, no murmur or gallops, no peripheral edema  Musculoskeletal: No deformities, no cyanosis or clubbing  Neuro: alert, awake, non  focal  Skin: Warm, no lesions or rash      Assessment & Plan:  Chronic cough Please continue your  fluticasone nasal spray, Astelin nasal spray as you have been taking them We will refill your promethazine/DM cough syrup.  Use 5 cc up to every 6 hours if needed for cough suppression. We will not restart pantoprazole at this time We will arrange for bronchoscopy and airway inspection in January Okay to keep albuterol available use 2 puffs when needed for shortness of breath, chest tightness, wheezing. Follow with Dr Delton Coombes in January after your bronchoscopy and sleep study so we can discuss results.  Fatigue Agree with getting your sleep study 08/12/2021.  We will follow-up to review the results and discuss possible treatment of sleep apnea  Levy Pupa, MD, PhD 08/06/2021, 11:19 AM [facility] [phone] or if no answer [phone]

## 2021-08-06 NOTE — Patient Instructions (Addendum)
Please continue your fluticasone nasal spray, Astelin nasal spray as you have been taking them We will refill your promethazine/DM cough syrup.  Use 5 cc up to every 6 hours if needed for cough suppression. We will not restart pantoprazole at this time We will arrange for bronchoscopy and airway inspection in January Okay to keep albuterol available use 2 puffs when needed for shortness of breath, chest tightness, wheezing. Agree with getting your sleep study 08/12/2021.  We will follow-up to review the results and discuss possible treatment of sleep apnea Follow with Dr Delton Coombes in January after your bronchoscopy and sleep study so we can discuss results.

## 2021-08-09 ENCOUNTER — Telehealth: Payer: Self-pay | Admitting: Emergency Medicine

## 2021-08-09 NOTE — Telephone Encounter (Signed)
I scheduled bronch for 1/9 at 1:00.  Pt will go for covid test on 1/6.  Spoke to her and gave her appt info.

## 2021-08-12 ENCOUNTER — Ambulatory Visit (HOSPITAL_BASED_OUTPATIENT_CLINIC_OR_DEPARTMENT_OTHER): Payer: Medicare PPO | Attending: Emergency Medicine | Admitting: Pulmonary Disease

## 2021-08-12 ENCOUNTER — Other Ambulatory Visit: Payer: Self-pay

## 2021-08-12 DIAGNOSIS — Z8669 Personal history of other diseases of the nervous system and sense organs: Secondary | ICD-10-CM | POA: Diagnosis not present

## 2021-08-12 DIAGNOSIS — G4733 Obstructive sleep apnea (adult) (pediatric): Secondary | ICD-10-CM | POA: Diagnosis not present

## 2021-08-12 DIAGNOSIS — G4736 Sleep related hypoventilation in conditions classified elsewhere: Secondary | ICD-10-CM | POA: Diagnosis not present

## 2021-08-13 DIAGNOSIS — Z8669 Personal history of other diseases of the nervous system and sense organs: Secondary | ICD-10-CM | POA: Diagnosis not present

## 2021-08-13 NOTE — Procedures (Signed)
° ° ° °  Patient Name: Kathleen Solis, Kathleen Solis Date: 08/12/2021 Gender: Female D.O.B: 03/11/1966 Age (years): 55 Referring Provider: Levy Pupa Height (inches): 63 Interpreting Physician: Coralyn Helling MD, ABSM Weight (lbs): 194 RPSGT: Lowry Ram BMI: 35 MRN: [id number] Neck Size: 14.50  CLINICAL INFORMATION Sleep Study Type: NPSG  Indication for sleep study: Daytime Fatigue, Fatigue, Hypertension, Memory Loss, Obesity, Snoring  Epworth Sleepiness Score: 3  SLEEP STUDY TECHNIQUE As per the AASM Manual for the Scoring of Sleep and Associated Events v2.3 (April 2016) with a hypopnea requiring 4% desaturations.  The channels recorded and monitored were frontal, central and occipital EEG, electrooculogram (EOG), submentalis EMG (chin), nasal and oral airflow, thoracic and abdominal wall motion, anterior tibialis EMG, snore microphone, electrocardiogram, and pulse oximetry.  MEDICATIONS Medications self-administered by patient taken the night of the study : TYLENOL, ATORVASTATIN, HYDROCHLOROTHIAZIDE, Metoproplol  SLEEP ARCHITECTURE The study was initiated at 10:45:58 PM and ended at 5:08:35 AM.  Sleep onset time was 55.5 minutes and the sleep efficiency was 75.8%%. The total sleep time was 290 minutes.  Stage REM latency was 1.5 minutes.  The patient spent 5.2%% of the night in stage N1 sleep, 76.0%% in stage N2 sleep, 0.0%% in stage N3 and 18.8% in REM.  Alpha intrusion was absent.  Supine sleep was 1.21%.  RESPIRATORY PARAMETERS The overall apnea/hypopnea index (AHI) was 6.4 per hour. There were 1 total apneas, including 1 obstructive, 0 central and 0 mixed apneas. There were 30 hypopneas and 6 RERAs.  The AHI during Stage REM sleep was 9.9 per hour.  AHI while supine was 85.7 per hour.  The mean oxygen saturation was 91.7%. The minimum SpO2 during sleep was 82.0%.  loud snoring was noted during this study.  CARDIAC DATA The 2 lead EKG demonstrated sinus  rhythm. The mean heart rate was 57.6 beats per minute. Other EKG findings include: None.  LEG MOVEMENT DATA The total PLMS were 0 with a resulting PLMS index of 0.0. Associated arousal with leg movement index was 3.5 .  IMPRESSIONS - Mild obstructive sleep apnea occurred during this study (AHI = 6.4/h). - Mild oxygen desaturation was noted during this study (Min O2 = 82.0%). - The patient snored with loud snoring volume. - No cardiac abnormalities were noted during this study. - Clinically significant periodic limb movements did not occur during sleep. No significant associated arousals.  DIAGNOSIS - Obstructive Sleep Apnea (G47.33) - Nocturnal Hypoxemia (G47.36)  RECOMMENDATIONS - Additional therapies include CPAP, oral appliance, or surgical assessment. - Avoid alcohol, sedatives and other CNS depressants that may worsen sleep apnea and disrupt normal sleep architecture. - Sleep hygiene should be reviewed to assess factors that may improve sleep quality. - Weight management and regular exercise should be initiated or continued if appropriate.  [Electronically signed] 08/13/2021 08:31 AM  Coralyn Helling MD, ABSM Diplomate, American Board of Sleep Medicine NPI: [id number]  [facility] PH: [phone]   FX: [phone] ACCREDITED BY THE AMERICAN ACADEMY OF SLEEP MEDICINE

## 2021-08-18 ENCOUNTER — Ambulatory Visit
Admission: RE | Admit: 2021-08-18 | Discharge: 2021-08-18 | Disposition: A | Discharge status: Home / Self Care | Payer: Medicare PPO | Source: Ambulatory Visit | Attending: Obstetrics and Gynecology | Admitting: Obstetrics and Gynecology

## 2021-08-18 DIAGNOSIS — Z1231 Encounter for screening mammogram for malignant neoplasm of breast: Secondary | ICD-10-CM | POA: Diagnosis not present

## 2021-08-26 ENCOUNTER — Encounter (HOSPITAL_COMMUNITY): Payer: Self-pay | Admitting: Emergency Medicine

## 2021-08-26 ENCOUNTER — Telehealth: Payer: Self-pay | Admitting: Emergency Medicine

## 2021-08-26 NOTE — Telephone Encounter (Signed)
Called and spoke with patient to let her know to stop her aspirin 2 days prior to procedure. She expressed understanding. Nothing further needed at this time.

## 2021-08-26 NOTE — Anesthesia Preprocedure Evaluation (Addendum)
Anesthesia Evaluation  Patient identified by MRN, date of birth, ID band Patient awake    Reviewed: Allergy & Precautions, NPO status , Patient's Chart, lab work & pertinent test results, reviewed documented beta blocker date and time   Airway Mallampati: II  TM Distance: >3 FB Neck ROM: Full    Dental  (+) Teeth Intact, Dental Advisory Given   Pulmonary asthma , sleep apnea ,  Chronic cough   Pulmonary exam normal breath sounds clear to auscultation       Cardiovascular hypertension, Pt. on home beta blockers and Pt. on medications + Peripheral Vascular Disease  Normal cardiovascular exam Rhythm:Regular Rate:Normal  Nuclear stress test 01/27/21:  The left ventricular ejection fraction is hyperdynamic (>65%).  Nuclear stress EF: 72%.  There was no ST segment deviation noted during stress.  No T wave inversion was noted during stress.  There is a small defect in the apical anteroseptal region that appears worse at rest consistent with artifact. Wall motion is normal.  There is no evidence of ischemia.  The study is normal.  This is a low risk study.   Echo 12/30/20: IMPRESSIONS  1. Left ventricular ejection fraction by 3D volume is 72 %. The left  ventricle has normal function. The left ventricle has no regional wall  motion abnormalities. Left ventricular diastolic parameters were normal.  2. Right ventricular systolic function is normal. The right ventricular  size is normal. There is normal pulmonary artery systolic pressure.  3. The mitral valve is normal in structure. Mild mitral valve  regurgitation. No evidence of mitral stenosis.  4. The aortic valve is normal in structure. Aortic valve regurgitation is  not visualized. No aortic stenosis is present.  5. The inferior vena cava is normal in size with greater than 50%  respiratory variability, suggesting right atrial pressure of 3 mmHg.     Neuro/Psych   Headaches,  Neuromuscular disease CVA, Residual Symptoms    GI/Hepatic Neg liver ROS, GERD  ,  Endo/Other  negative endocrine ROSObesity   Renal/GU negative Renal ROS     Musculoskeletal negative musculoskeletal ROS (+)   Abdominal   Peds  Hematology negative hematology ROS (+)   Anesthesia Other Findings Day of surgery medications reviewed with the patient.  Reproductive/Obstetrics                            Anesthesia Physical Anesthesia Plan  ASA: 3  Anesthesia Plan: General   Post-op Pain Management:    Induction: Intravenous  PONV Risk Score and Plan: 3 and Dexamethasone and Ondansetron  Airway Management Planned: Oral ETT  Additional Equipment:   Intra-op Plan:   Post-operative Plan: Extubation in OR  Informed Consent: I have reviewed the patients History and Physical, chart, labs and discussed the procedure including the risks, benefits and alternatives for the proposed anesthesia with the patient or authorized representative who has indicated his/her understanding and acceptance.     Dental advisory given  Plan Discussed with: CRNA  Anesthesia Plan Comments: (PAT note written 08/26/2021 by Shonna Chock, PA-C. )       Anesthesia Quick Evaluation

## 2021-08-26 NOTE — Telephone Encounter (Signed)
Please have her stop it 2 days prior.

## 2021-08-26 NOTE — Telephone Encounter (Signed)
Called patient. Someone answered the phone but did not say anything. Will need to call her back tomorrow.

## 2021-08-26 NOTE — Progress Notes (Signed)
Anesthesia Chart Review: Kathleen Solis  Case: [id number] Date/Time: 08/30/21 1300   Procedure: VIDEO BRONCHOSCOPY WITHOUT FLUORO   Anesthesia type: General   Pre-op diagnosis: CHRNOIC COUGH   Location: [facility] / [facility]   Surgeons: Collene Gobble, MD       DISCUSSION: Patient is a 56 year old female scheduled for the above procedure. Bronchoscopy and airway inspection recommended given chronic cough.  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   She had recent cardiac testing including a low risk stress test in 01/2021. 12/2020 echo showed LVEF with mild MR.  She is a same day work-up, so labs as indicated on arrival. Anesthesia team to evaluate on the day of surgery.    VS:  BP Readings from Last 3 Encounters:  08/06/21 132/80  05/08/21 122/75  05/07/21 126/78   Pulse Readings from Last 3 Encounters:  08/06/21 65  05/08/21 61  05/07/21 70     PROVIDERS: Binnie Rail, MD is PCP  - Cherlynn Kaiser, MD is cardiologist. Initially seen in April 2022 for HTN, chest pain, SOB. Unable to get CCTA due to the size of the IV required and difficult IV access. She did have a stress and echo.  01/2021 stress test was nonischemic.  12/2020 echo showed normal LVEF with mild MR. Baltazar Apo, MD is pulmonologist - Prudy Feeler, MD is allergist/immunologist - Metta Clines, DO is neurologistMild neurocognitive disorder - unclear etiology but may be vascular due to lack of alternative  conditions. Maryland Pink, MD is GYN-Urologist   LABS: For labs on arrival as indicated. Currently, last lab results include: Lab Results  Component Value Date   WBC 8.1 05/08/2021   HGB 14.6 05/08/2021   HCT 40.5 05/08/2021   PLT 220 05/08/2021   GLUCOSE 129 (H) 05/08/2021   ALT 21 05/08/2021   AST 27 05/08/2021   NA 142 05/08/2021   K 4.3 05/08/2021   CL 102 05/08/2021   CREATININE 0.61 05/08/2021   BUN 14 05/08/2021   CO2 31 05/08/2021   TSH 0.88 11/03/2020   HGBA1C 5.1 11/03/2020    OTHER: Sleep Study (NPSG) 08/12/21: IMPRESSIONS - Mild obstructive sleep apnea occurred during this study (AHI = 6.4/h). - Mild oxygen desaturation was noted during this study (Min O2 = 82.0%). - The patient snored with loud snoring volume. - No cardiac abnormalities were noted during this study. - Clinically significant periodic limb movements did not occur during sleep. No significant associated arousals. RECOMMENDATIONS - Additional therapies include CPAP, oral appliance, or surgical assessment...  Spirometry 06/05/20: FVC post best 2.60 (88%), FEV1 2.02 (86%), FEV1R 0.78 (96%), FEF 25-75 1.84 (76%). Normal ventilatory function.    IMAGES: CXR 02/03/21: FINDINGS: The heart size and mediastinal contours are within normal limits. Both lungs are clear. The visualized skeletal structures are unremarkable. IMPRESSION: No active cardiopulmonary disease.   MRI Brain 11/28/20: IMPRESSION: - Chronic hyperintensity left frontal parietal white matter unchanged likely due to chronic ischemia. - No acute infarct.  No acute abnormality no change from 2018 MRI.     EKG: 02/08/21 ([facility]): Normal sinus  rhythm Nonspecific T wave abnormality   CV: Nuclear stress test 01/27/21: The left ventricular ejection fraction is hyperdynamic (>65%). Nuclear stress EF: 72%. There was no ST segment deviation noted during stress. No T wave inversion was noted during stress. There is a  small defect in the apical anteroseptal region that appears worse at rest consistent with artifact. Wall motion is normal. There is no evidence of ischemia. The study is normal. This is a low risk study.    Echo 12/30/20: IMPRESSIONS   1. Left ventricular ejection fraction by 3D volume is 72 %. The left  ventricle has normal function. The left ventricle has no regional wall  motion abnormalities. Left ventricular diastolic parameters were normal.   2. Right ventricular systolic function is normal. The right ventricular  size is normal. There is normal pulmonary artery systolic pressure.   3. The mitral valve is normal in structure. Mild mitral valve  regurgitation. No evidence of mitral stenosis.   4. The aortic valve is normal in structure. Aortic valve regurgitation is  not visualized. No aortic stenosis is present.   5. The inferior vena cava is normal in size with greater than 50%  respiratory variability, suggesting right atrial pressure of 3 mmHg.    US Carotid 11/13/20: Summary:  - Right Carotid: There is no evidence of stenosis in the right ICA. The extracranial vessels were near-normal with only minimal wall thickening or plaque.  - Left Carotid: There is no evidence of stenosis in the left ICA. The extracranial vessels were near-normal with only minimal wall thickening or plaque.  - Vertebrals:  Bilateral vertebral arteries demonstrate antegrade flow.  - Subclavians: Normal flow hemodynamics were seen in bilateral subclavian arteries.    Past Medical History:  Diagnosis Date   Allergic rhinitis 08/16/2016   Benign essential hypertension 03/03/2015   Bone spur of right foot 02/08/2016   Bunion of left foot 02/08/2016   Carotid arterial disease 04/10/2016   03/2016: right 40-59%, left < 40%; 10/2020 carotid US: No evidence of B ICA stenosis   Chest pain 02/14/2017   Cyst of left ovary 03/03/2015   Diverticulitis of colon without hemorrhage 02/25/2016   Epigastric pain  05/06/2020   Episodic tension-type headache 01/12/2016   Taking motrin 200 mg bid   GERD (gastroesophageal reflux disease)    History of cerebrovascular accident (CVA) with residual deficit 2001   Continue aspirin 81 mg daily, statin and current blood pressure medications; Blood pressure has been well controlled and cholesterol has been at goal   Hyperglycemia 02/14/2017   Chronic; encouraged low sugar/carbohydrate diet and regular exercise   Hyperlipidemia    diet controlled - no med   Leg neuralgia, right    Related to CVA    Lumbar back pain 05/10/2018   Lumbar radiculopathy 05/15/2018   Pain seems consistent with radiculopathy   MCI (mild cognitive impairment) with memory loss 12/31/2020   Midline cystocele    Mild asthma 08/27/2020   Onychomycosis 02/21/2018   Toenails; Ciclopirox prescribed   Sciatica    right   Sinus infection 03/22/2019   SVD (spontaneous vaginal delivery)    x 2   Tinnitus of right ear 11/16/2016   Vitamin B12 deficiency 02/23/2018    Past Surgical History:  Procedure Laterality Date   ABDOMINAL HYSTERECTOMY     partial   ANAL FISSURE REPAIR     APPENDECTOMY     CHOLECYSTECTOMY     CYSTOSCOPY N/A 10/04/2017   Procedure: CYSTOSCOPY;  Surgeon:  Christophe Louis, MD;  Location: [facility];  Service: Gynecology;  Laterality: N/A;   FOOT SURGERY Left    big toe bunion removed   LAPAROSCOPIC BILATERAL SALPINGO OOPHERECTOMY Bilateral 10/04/2017   Procedure: LAPAROSCOPIC BILATERAL SALPINGO OOPHORECTOMY, PELVIC WASHINGS;  Surgeon: Christophe Louis, MD;  Location: [facility];  Service: Gynecology;  Laterality: Bilateral;   TUBAL LIGATION     WISDOM TOOTH EXTRACTION      MEDICATIONS: No current facility-administered medications for this encounter.    acetaminophen (TYLENOL) 500 MG tablet   aspirin EC 81 MG tablet   atorvastatin (LIPITOR) 10 MG tablet   fluticasone (FLONASE) 50 MCG/ACT nasal spray   hydrochlorothiazide (HYDRODIURIL) 25 MG tablet   Menthol (RICOLA) LOZG    metoprolol succinate (TOPROL-XL) 100 MG 24 hr tablet   promethazine-dextromethorphan (PROMETHAZINE-DM) 6.25-15 MG/5ML syrup   vitamin B-12 (CYANOCOBALAMIN) 1000 MCG tablet   azelastine (ASTELIN) 0.1 % nasal spray   lidocaine (XYLOCAINE) 2 % solution   nortriptyline (PAMELOR) 10 MG capsule   ondansetron (ZOFRAN ODT) 4 MG disintegrating tablet   pantoprazole (PROTONIX) 20 MG tablet   sucralfate (CARAFATE) 1 g tablet    Myra Gianotti, PA-C Surgical Short Stay/Anesthesiology [facility] Phone [phone] [facility] Phone [phone] 08/26/2021 10:45 AM

## 2021-08-26 NOTE — Progress Notes (Signed)
DUE TO COVID-19 ONLY ONE VISITOR IS ALLOWED TO COME WITH YOU AND STAY IN THE WAITING ROOM ONLY DURING PRE OP AND PROCEDURE DAY OF SURGERY.   PCP - Dr Billey Gosling Cardiologist - Dr Cherlynn Kaiser Neurology - Metta Clines, DO  Chest x-ray - 02/03/21 (2V) EKG - 02/08/21 Stress Test - 01/27/21 ECHO - 12/30/20 Cardiac Cath - n/a  ICD Pacemaker/Loop - n/a  Sleep Study -  Yes, recently dx, patient has an appt on 09/15/21 with MD to discuss results/CPAP. CPAP - none  Aspirin Instructions: Follow your surgeon's instructions on when to stop aspirin prior to surgery,  If no instructions were given by your surgeon then you will need to call the office for those instructions.  Anesthesia review: Yes  STOP now taking any Aspirin (unless otherwise instructed by your surgeon), Aleve, Naproxen, Ibuprofen, Motrin, Advil, Goody's, BC's, all herbal medications, fish oil, and all vitamins.   Coronavirus Screening Covid test is scheduled on 08/27/21 per patient. Do you have any of the following symptoms:  Cough yes/no: Yes (nothing new) Fever (>100.54F)  yes/no: No Runny nose yes/no: No Sore throat yes/no: No Difficulty breathing/shortness of breath  yes/no: No  Have you traveled in the last 14 days and where? yes/no: No  Patient verbalized understanding of instructions that were given via phone.

## 2021-08-27 ENCOUNTER — Other Ambulatory Visit: Payer: Self-pay | Admitting: Emergency Medicine

## 2021-08-27 LAB — SARS CORONAVIRUS 2 (TAT 6-24 HRS): SARS Coronavirus 2: NEGATIVE

## 2021-08-30 ENCOUNTER — Ambulatory Visit (HOSPITAL_COMMUNITY): Payer: Medicare PPO | Admitting: Vascular Surgery

## 2021-08-30 ENCOUNTER — Other Ambulatory Visit: Payer: Self-pay

## 2021-08-30 ENCOUNTER — Ambulatory Visit (HOSPITAL_COMMUNITY)
Admission: RE | Admit: 2021-08-30 | Discharge: 2021-08-30 | Disposition: A | Discharge status: Home / Self Care | Payer: Medicare PPO | Attending: Emergency Medicine | Admitting: Emergency Medicine

## 2021-08-30 ENCOUNTER — Encounter (HOSPITAL_COMMUNITY): Payer: Self-pay | Admitting: Emergency Medicine

## 2021-08-30 ENCOUNTER — Encounter (HOSPITAL_COMMUNITY)
Admission: RE | Disposition: A | Discharge status: Home / Self Care | Payer: Self-pay | Source: Home / Self Care | Attending: Emergency Medicine

## 2021-08-30 DIAGNOSIS — K219 Gastro-esophageal reflux disease without esophagitis: Secondary | ICD-10-CM | POA: Insufficient documentation

## 2021-08-30 DIAGNOSIS — J383 Other diseases of vocal cords: Secondary | ICD-10-CM | POA: Diagnosis not present

## 2021-08-30 DIAGNOSIS — I739 Peripheral vascular disease, unspecified: Secondary | ICD-10-CM | POA: Insufficient documentation

## 2021-08-30 DIAGNOSIS — G709 Myoneural disorder, unspecified: Secondary | ICD-10-CM | POA: Insufficient documentation

## 2021-08-30 DIAGNOSIS — R5383 Other fatigue: Secondary | ICD-10-CM | POA: Diagnosis not present

## 2021-08-30 DIAGNOSIS — Z8673 Personal history of transient ischemic attack (TIA), and cerebral infarction without residual deficits: Secondary | ICD-10-CM | POA: Insufficient documentation

## 2021-08-30 DIAGNOSIS — J45909 Unspecified asthma, uncomplicated: Secondary | ICD-10-CM | POA: Insufficient documentation

## 2021-08-30 DIAGNOSIS — I10 Essential (primary) hypertension: Secondary | ICD-10-CM | POA: Insufficient documentation

## 2021-08-30 DIAGNOSIS — J387 Other diseases of larynx: Secondary | ICD-10-CM | POA: Diagnosis not present

## 2021-08-30 DIAGNOSIS — G4730 Sleep apnea, unspecified: Secondary | ICD-10-CM | POA: Diagnosis not present

## 2021-08-30 DIAGNOSIS — R053 Chronic cough: Secondary | ICD-10-CM | POA: Diagnosis not present

## 2021-08-30 HISTORY — DX: Sleep apnea, unspecified: G47.30

## 2021-08-30 HISTORY — PX: VIDEO BRONCHOSCOPY: SHX5072

## 2021-08-30 HISTORY — PX: BRONCHIAL WASHINGS: SHX5105

## 2021-08-30 LAB — BASIC METABOLIC PANEL
Anion gap: 12 (ref 5–15)
BUN: 16 mg/dL (ref 6–20)
CO2: 25 mmol/L (ref 22–32)
Calcium: 9.3 mg/dL (ref 8.9–10.3)
Chloride: 101 mmol/L (ref 98–111)
Creatinine, Ser: 0.75 mg/dL (ref 0.44–1.00)
GFR, Estimated: >60 mL/min (ref >60)
Glucose, Bld: 110 mg/dL — ABNORMAL HIGH (ref 70–99)
Potassium: 3.4 mmol/L — ABNORMAL LOW (ref 3.5–5.1)
Sodium: 138 mmol/L (ref 135–145)

## 2021-08-30 LAB — CBC
HCT: 39.7 % (ref 36.0–46.0)
Hemoglobin: 14.2 g/dL (ref 12.0–15.0)
MCH: 32.6 pg (ref 26.0–34.0)
MCHC: 35.8 g/dL (ref 30.0–36.0)
MCV: 91.1 fL (ref 80.0–100.0)
Platelets: 248 10*3/uL (ref 150–400)
RBC: 4.36 MIL/uL (ref 3.87–5.11)
RDW: 14 % (ref 11.5–15.5)
WBC: 6.9 10*3/uL (ref 4.0–10.5)
nRBC: 0 % (ref 0.0–0.2)

## 2021-08-30 SURGERY — VIDEO BRONCHOSCOPY WITHOUT FLUORO
Anesthesia: General

## 2021-08-30 MED ORDER — FLUTICASONE PROPIONATE 50 MCG/ACT NA SUSP: 2.0000 | Freq: Every day | NASAL | Status: DC

## 2021-08-30 MED ORDER — LIDOCAINE HCL URETHRAL/MUCOSAL 2 % EX GEL: 1.0000 "application " | Freq: Once | CUTANEOUS | Status: DC

## 2021-08-30 MED ORDER — FENTANYL CITRATE (PF) 100 MCG/2ML IJ SOLN
INTRAMUSCULAR | Status: DC | PRN
Start: 2021-08-30 — End: 2021-08-30
  Administered 2021-08-30 (×2): 25 ug via INTRAVENOUS

## 2021-08-30 MED ORDER — FENTANYL CITRATE (PF) 100 MCG/2ML IJ SOLN: 25.0000 ug | INTRAMUSCULAR | Status: DC | PRN

## 2021-08-30 MED ORDER — ACETAMINOPHEN 10 MG/ML IV SOLN
INTRAVENOUS | Status: AC
  Administered 2021-08-30: 1000 mg via INTRAVENOUS
  Filled 2021-08-30: qty 100

## 2021-08-30 MED ORDER — LIDOCAINE HCL (PF) 1 % IJ SOLN
INTRAMUSCULAR | Status: DC | PRN
  Administered 2021-08-30 (×4): 2 mL

## 2021-08-30 MED ORDER — MIDAZOLAM HCL 2 MG/2ML IJ SOLN
INTRAMUSCULAR | Status: DC | PRN
  Administered 2021-08-30: 2 mg via INTRAVENOUS

## 2021-08-30 MED ORDER — LACTATED RINGERS IV SOLN: INTRAVENOUS | Status: DC

## 2021-08-30 MED ORDER — LIDOCAINE 2% (20 MG/ML) 5 ML SYRINGE
INTRAMUSCULAR | Status: DC | PRN
Start: 2021-08-30 — End: 2021-08-30
  Administered 2021-08-30: 40 mg via INTRAVENOUS
  Administered 2021-08-30: 60 mg via INTRAVENOUS

## 2021-08-30 MED ORDER — PROPOFOL 500 MG/50ML IV EMUL
INTRAVENOUS | Status: DC | PRN
  Administered 2021-08-30: 130 ug/kg/min via INTRAVENOUS

## 2021-08-30 MED ORDER — LIDOCAINE HCL (PF) 1 % IJ SOLN
INTRAMUSCULAR | Status: AC
  Filled 2021-08-30: qty 30

## 2021-08-30 MED ORDER — HYDROCHLOROTHIAZIDE 25 MG PO TABS: 25.0000 mg | ORAL_TABLET | Freq: Every day | ORAL | Status: DC

## 2021-08-30 MED ORDER — LIDOCAINE HCL (PF) 1 % IJ SOLN: INTRAMUSCULAR | Status: DC | PRN

## 2021-08-30 MED ORDER — PROPOFOL 10 MG/ML IV BOLUS
INTRAVENOUS | Status: DC | PRN
Start: 2021-08-30 — End: 2021-08-30
  Administered 2021-08-30: 30 mg via INTRAVENOUS
  Administered 2021-08-30: 15 mg via INTRAVENOUS

## 2021-08-30 MED ORDER — ACETAMINOPHEN 10 MG/ML IV SOLN: 1000.0000 mg | Freq: Once | INTRAVENOUS | Status: AC

## 2021-08-30 MED ORDER — ONDANSETRON HCL 4 MG/2ML IJ SOLN
INTRAMUSCULAR | Status: DC | PRN
  Administered 2021-08-30: 4 mg via INTRAVENOUS

## 2021-08-30 MED ORDER — ASPIRIN EC 81 MG PO TBEC: 81.0000 mg | DELAYED_RELEASE_TABLET | Freq: Every morning | ORAL | Status: DC

## 2021-08-30 MED ORDER — CHLORHEXIDINE GLUCONATE 0.12 % MT SOLN
15.0000 mL | Freq: Once | OROMUCOSAL | Status: AC
  Administered 2021-08-30: 15 mL via OROMUCOSAL
  Filled 2021-08-30 (×2): qty 15

## 2021-08-30 NOTE — Discharge Instructions (Signed)
Flexible Bronchoscopy, Care After This sheet gives you information about how to care for yourself after your test. Your doctor may also give you more specific instructions. If you have problems or questions, contact your doctor. Follow these instructions at home: Eating and drinking Do not eat or drink anything (not even water) for 2 hours after your test, or until your numbing medicine (local anesthetic) wears off. When your numbness is gone and your cough and gag reflexes have come back, you may: Eat only soft foods. Slowly drink liquids. The day after the test, go back to your normal diet. Driving Do not drive for 24 hours if you were given a medicine to help you relax (sedative). Do not drive or use heavy machinery while taking prescription pain medicine. General instructions  Take over-the-counter and prescription medicines only as told by your doctor. Return to your normal activities as told. Ask what activities are safe for you. Do not use any products that have nicotine or tobacco in them. This includes cigarettes and e-cigarettes. If you need help quitting, ask your doctor. Keep all follow-up visits as told by your doctor. This is important. It is very important if you had a tissue sample (biopsy) taken. Get help right away if: You have shortness of breath that gets worse. You get light-headed. You feel like you are going to pass out (faint). You have chest pain. You cough up: More than a little blood. More blood than before. Summary Do not eat or drink anything (not even water) for 2 hours after your test, or until your numbing medicine wears off. Do not use cigarettes. Do not use e-cigarettes. Get help right away if you have chest pain.  Please call our office for any questions or concerns. [phone].   This information is not intended to replace advice given to you by your health care provider. Make sure you discuss any questions you have with your health care  provider. Document Released: 06/05/2009 Document Revised: 07/21/2017 Document Reviewed: 08/26/2016 Elsevier Patient Education  2020 [employer].

## 2021-08-30 NOTE — Anesthesia Procedure Notes (Signed)
Procedure Name: MAC Date/Time: 08/30/2021 1:24 PM Performed by: Janace Litten, CRNA Pre-anesthesia Checklist: Patient identified, Emergency Drugs available, Suction available and Patient being monitored Patient Re-evaluated:Patient Re-evaluated prior to induction Oxygen Delivery Method: Simple face mask

## 2021-08-30 NOTE — Anesthesia Postprocedure Evaluation (Signed)
Anesthesia Post Note  Patient: Kathleen Solis  Procedure(s) Performed: VIDEO BRONCHOSCOPY WITHOUT FLUORO BRONCHIAL WASHINGS     Patient location during evaluation: PACU Anesthesia Type: General Level of consciousness: awake and alert Pain management: pain level controlled Vital Signs Assessment: post-procedure vital signs reviewed and stable Respiratory status: spontaneous breathing, nonlabored ventilation, respiratory function stable and patient connected to nasal cannula oxygen Cardiovascular status: stable and blood pressure returned to baseline Postop Assessment: no apparent nausea or vomiting Anesthetic complications: no   No notable events documented.  Last Vitals:  Vitals:   08/30/21 1110 08/30/21 1355  BP: 122/90   Pulse: 74   Resp: 18   Temp: (!) 36.4 C 36.7 C  SpO2: 95%     Last Pain:  Vitals:   08/30/21 1355  TempSrc:   PainSc: 0-No pain                 Catalina Gravel

## 2021-08-30 NOTE — Transfer of Care (Signed)
Immediate Anesthesia Transfer of Care Note  Patient: Kathleen Solis  Procedure(s) Performed: VIDEO BRONCHOSCOPY WITHOUT FLUORO BRONCHIAL WASHINGS  Patient Location: PACU  Anesthesia Type:MAC  Level of Consciousness: awake, alert  and patient cooperative  Airway & Oxygen Therapy: Patient Spontanous Breathing  Post-op Assessment: Report given to RN and Post -op Vital signs reviewed and stable  Post vital signs: Reviewed and stable  Last Vitals:  Vitals Value Taken Time  BP    Temp    Pulse 83 08/30/21 1352  Resp 13 08/30/21 1352  SpO2 87 % 08/30/21 1352  Vitals shown include unvalidated device data.  Last Pain:  Vitals:   08/30/21 1130  TempSrc:   PainSc: 0-No pain         Complications: No notable events documented.

## 2021-08-30 NOTE — Op Note (Signed)
[facility] Cardiopulmonary Patient Name: Kathleen Solis Pocedure Date: 08/30/2021 MRN: [id number] Attending MD: Collene Gobble , MD Date of Birth: 10-07-1965 CSN: Finalized Age: 56 Admit Type: Outpatient Gender: Female Procedure:             Bronchoscopy Indications:           Chronic cough with normal chest X-ray Providers:             Collene Gobble, MD, Doristine Johns, RN, [facility]                         Technician, Technician Referring MD:           Medicines:             Monitored Anesthesia Care Complications:         No immediate complications Estimated Blood Loss:  Estimated blood loss: none. Procedure:             Pre-Anesthesia Assessment:                        - A History and Physical has been performed. Patient                         meds and allergies have been reviewed. The risks and                         benefits of the procedure and the sedation options and                         risks were discussed with the patient. All questions                         were answered and informed consent was obtained.                         Patient identification and proposed procedure were                         verified prior to the procedure by the physician in                         the pre-procedure area. Mental Status Examination:                         normal. Airway Examination: normal oropharyngeal                         airway. Respiratory Examination: clear to                         auscultation. CV Examination: normal. ASA Grade                         Assessment: I - A normal healthy patient. After                         reviewing the risks and benefits, the patient was  deemed in satisfactory condition to undergo the                         procedure. The anesthesia plan was to use deep                         sedation / analgesia. Immediately prior to                         administration of medications, the  patient was                         re-assessed for adequacy to receive sedatives. The                         heart rate, respiratory rate, oxygen saturations,                         blood pressure, adequacy of pulmonary ventilation, and                         response to care were monitored throughout the                         procedure. The physical status of the patient was                         re-assessed after the procedure.                        After obtaining informed consent, the bronchoscope was                         passed under direct vision. Throughout the procedure,                         the patient's blood pressure, pulse, and oxygen                         saturations were monitored continuously. the BF-H190                         ([id number]) Olympus bronchoscope was introduced through                         the mouth, via face mask and advanced to the                         tracheobronchial tree. The procedure was accomplished                         without difficulty. The patient tolerated the                         procedure well. Scope In: Scope Out: Findings:      Larynx: Leukoplakia was found diffusely, throughout the larynx.       Hyperplastic changes were found diffusely, throughout the larynx and in       the larynx. The changes are not  obstructing the airway. GERD findings       were visualized including granulomas of arytenoid vocal processes.      The remainder of the tracheobronchial tree is normal. No endobronchial       lesions or abnormal secretions. She had collapsibility and       tracheomalacia with coughing.      Bronchoalveolar lavage was performed in the RUL anterior segment (B3) of       the lung and sent for cell count, bacterial culture, viral smears &       culture, and fungal & AFB analysis. 60 mL of fluid were instilled. 20 mL       were returned. The return was clear. There were no mucoid plugs in the       return  fluid. Impression:            - Chronic cough with normal chest X-ray                        - Leukoplakia was seen diffusely, throughout the                         larynx.                        - Hyperplastic changes were seen diffusely, throughout                         the larynx and in the larynx.                        - Granulomas of arytenoid vocal processes suspected to                         be secondary to gastroesophageal reflux disease (GERD)                         was found.                        - Bronchoalveolar lavage was performed. Moderate Sedation:      Procedure was performed under MAC. Recommendation:        - Await BAL results. Procedure Code(s):     --- Professional ---                        [phone], Bronchoscopy, rigid or flexible, including                         fluoroscopic guidance, when performed; with bronchial                         alveolar lavage Diagnosis Code(s):     --- Professional ---                        R05, Cough CPT copyright 2019 American Medical Association. All rights reserved. The codes documented in this report are preliminary and upon coder review may  be revised to meet current compliance requirements. Collene Gobble, MD Collene Gobble, MD 08/30/2021 2:06:32 PM Number of Addenda: 0

## 2021-08-30 NOTE — Interval H&P Note (Signed)
History and Physical Interval Note:  08/30/2021 1:04 PM  Kathleen Solis  has presented today for surgery, with the diagnosis of [place].  The various methods of treatment have been discussed with the patient and family. After consideration of risks, benefits and other options for treatment, the patient has consented to  Procedure(s): VIDEO BRONCHOSCOPY WITHOUT FLUORO (N/A) as a surgical intervention.  The patient's history has been reviewed, patient examined, no change in status, stable for surgery.  I have reviewed the patient's chart and labs.  Questions were answered to the patient's satisfaction.     Collene Gobble

## 2021-08-31 ENCOUNTER — Encounter (HOSPITAL_COMMUNITY): Payer: Self-pay | Admitting: Emergency Medicine

## 2021-09-01 LAB — BODY FLUID CELL COUNT WITH DIFFERENTIAL: Total Nucleated Cell Count, Fluid: 9 cu mm (ref 0–1000)

## 2021-09-01 LAB — FUNGUS STAIN

## 2021-09-01 LAB — CULTURE, RESPIRATORY W GRAM STAIN: Culture: NORMAL

## 2021-09-01 LAB — ACID FAST SMEAR (AFB, MYCOBACTERIA): Acid Fast Smear: NEGATIVE

## 2021-09-02 ENCOUNTER — Telehealth: Payer: Self-pay | Admitting: Emergency Medicine

## 2021-09-02 LAB — AEROBIC/ANAEROBIC CULTURE W GRAM STAIN (SURGICAL/DEEP WOUND): Culture: NORMAL

## 2021-09-02 NOTE — Telephone Encounter (Signed)
Called and spoke with patient. She stated that she had a bronch done on Monday 08/30/21. Ever since the bronch, her cough has gotten worse. She denied having a productive cough and has not seen any blood. She also stated that her throat as been worse. I advised her that it is pretty normal to have a lingering cough and sore throat for a few days after the procedure. When asked if she was having any other symptoms, she stated that when she coughs she will urinate on herself. This is something new that started after the bronch. She has not contacted her PCP about this yet.   She stated that she does have the promethazine cough syrup but prefers to take this when she doesn't plan leaving the house.   RB, can you please advise? Thanks!

## 2021-09-02 NOTE — Telephone Encounter (Signed)
Spoke to patient and relayed below message/recommendation.  She stated that she is allergic to tessalon pearls. She would like to try promethazine.  She will call back if sputum color changes. Nothing further needed at this time.

## 2021-09-02 NOTE — Telephone Encounter (Signed)
Agree that these sx are not unusual after bronch, but they should resolve in a few days. If she is coughing more, or if she starts bringing up colored sputum then we may need to consider treating for bronchitis. She needs to let us know.  When cough is severe it can sometimes cause stress incontinence - we need to make the cough better. OK for her to use the cough syrup when she is comfortable doing so.  We could add tessalon perles if she would like to try these > 100mg  q6h prn for cough.   Also, tell her that all of her bronchoscopy cultures are negative so far.

## 2021-09-03 NOTE — Telephone Encounter (Signed)
She can take regular delsym along with tesslon perles. Is she is coughing up colored mucus? Per Dr. Lamonte Sakai note yesterday bronchoscopy cultures are negative so far.

## 2021-09-03 NOTE — Telephone Encounter (Signed)
Thank you :)

## 2021-09-03 NOTE — Telephone Encounter (Signed)
Patient is aware of recommendations and voiced her understanding.  She stated that she is not coughing up any sputum. She will try Delsym but she can not take tessalon.  Routing back to beth as an [place].

## 2021-09-03 NOTE — Telephone Encounter (Signed)
Spoke with the pt  She states that she is still coughing despite prometh dm syrup  She says that she can't take tessalon  Allergic to pred  She is miserable with this cough  Cough is non prod and severe to the point of losing urinary continence  She is using ricola cough drops- advised to d/c these and try sugarfee non min menthol candies  Try and resist throat clearing, sips of water/ice chips  She states the only thing that has ever helped is codiene/prometh syrup  Beth, please advise and also she wanted to see if her final bronch results were in  Please advise, thanks!  Allergies  Allergen Reactions   Benzonatate Other (See Comments)    Headaches   Darvon [Propoxyphene] Other (See Comments)    Gi problems   Ibuprofen     tinnitus   Morphine And Related Itching   Prednisone Other (See Comments)    Make her head hurt   Sulfa Antibiotics Hives   Tussionex Pennkinetic Er [Hydrocod Polst-Cpm Polst Er] Other (See Comments)    Nausea, vomiting   Buprenorphine Hcl Itching   Ivp Dye [Iodinated Contrast Media] Nausea And Vomiting   Prochlorperazine Edisylate Itching and Other (See Comments)    "Acts weird: Hallucinations"

## 2021-09-04 DIAGNOSIS — Z20822 Contact with and (suspected) exposure to covid-19: Secondary | ICD-10-CM | POA: Diagnosis not present

## 2021-09-04 DIAGNOSIS — Z20828 Contact with and (suspected) exposure to other viral communicable diseases: Secondary | ICD-10-CM | POA: Diagnosis not present

## 2021-09-15 ENCOUNTER — Encounter: Payer: Self-pay | Admitting: Emergency Medicine

## 2021-09-15 ENCOUNTER — Ambulatory Visit (INDEPENDENT_AMBULATORY_CARE_PROVIDER_SITE_OTHER): Payer: Medicare PPO | Admitting: Emergency Medicine

## 2021-09-15 ENCOUNTER — Other Ambulatory Visit: Payer: Self-pay

## 2021-09-15 VITALS — BP 126/82 | HR 70 | Temp 98.0°F | Ht 65.5 in | Wt 200.2 lb

## 2021-09-15 DIAGNOSIS — G4733 Obstructive sleep apnea (adult) (pediatric): Secondary | ICD-10-CM | POA: Diagnosis not present

## 2021-09-15 DIAGNOSIS — R053 Chronic cough: Secondary | ICD-10-CM

## 2021-09-15 MED ORDER — AMOXICILLIN-POT CLAVULANATE 875-125 MG PO TABS: 1.0000 | ORAL_TABLET | Freq: Two times a day (BID) | ORAL | 0 refills | Status: DC

## 2021-09-15 NOTE — Assessment & Plan Note (Signed)
Continues to have cough, may be slightly less bothersome.  Bronchoscopy was reassuring with some evidence for LPR and upper airway leukoplakia, redundant tissue.  Also with tracheobronchomalacia probably a result of her cough.  Interestingly her cultures grew out Prevotella, unclear significance.  There was beta-lactamase positivity on the culture.  Reasonable to treat this with Augmentin to see if it makes any impact.  She agrees.  We will continue her fluticasone nasal spray.  She has not benefited in the past from PPI although we may need to retry this at some point going forward especially given the bronchoscopy observations.  I will refill her Promethazine DM cough syrup.

## 2021-09-15 NOTE — Assessment & Plan Note (Signed)
Mild OSA noted on her PSG.  CPAP was not placed on a split-night study.  She is willing to do a trial of CPAP and I will order AutoSet 8-20 cm water, heated humidity, best fit mask.  She will need follow-up with me 60-90 days after to confirm compliance.

## 2021-09-15 NOTE — Patient Instructions (Signed)
Please take Augmentin 875 mg twice a day for 7 days. We will refill your promethazine cough syrup today. Please continue to use your Flonase nasal spray as you have been taking it. We will start CPAP treatment.  We will order this for you today. Follow with Dr. Delton Coombes in 2 months or sooner if you have any problems.

## 2021-09-15 NOTE — Progress Notes (Signed)
Subjective:    Patient ID: Kathleen Solis, female    DOB: [birth year], 56 y.o.   MRN: [id number]  HPI  ROV 08/27/20 --56 year old woman with hypertension, history of TIA, allergic rhinitis and GERD.  She been dealing with chronic cough due to the above.  She also has some mild obstruction on spirometry.  We were going Symbicort because it was not preferred by her insurance - but she was able to get it covered.  She has used promethazine DM for cough suppression, uses it at bedtime. Currently on flonase, has not been helped by antihistamine before. She underwent skin testing, sensitive to to multiple pollens, mold, cockroaches. She started [place], did not start allegra or dymista or eyedrops that were discussed in her notes. Overall her breathing and functional capacity is good. She does a lot of throat clearing, using ginger hard candies. She does not feel any GERD on the omeprazole bid. She is out of the promethazine, doesn't get benefit from delsym. She wants to avoid codeine.   ROV 08/06/21 --follow-up visit 56 year old woman for chronic cough, mild obstructive lung disease by spirometry consistent with mild intermittent asthma.  She has a history of hypertension, TIA, allergic rhinitis, GERD.  She is sensitive to multiple antigens based on her skin testing.  She had COVID-19 in late September 2022.  We had discussed possibly getting a sleep study > scheduled for 12/22.  Today she reports that her COVID sx were very mild > fatigue. She has chronic cough but it didn't really change. She is on flonase, astelin, has stable congestion and drainage daily. She did a trial of PPI, but she came off of it after ineffective. No real change in the cough. She is off Symbicort - ineffective. She rarely needs albuterol.    ROV 09/15/21 --Kathleen Solis follows up today for her chronic cough.  She has mild obstructive lung disease with spirometry consistent with mild intermittent asthma.  Also with allergic rhinitis, GERD  which are contributors.  We performed a split-night sleep study 08/12/2021 for fatigue and suspected OSA. She underwent bronchoscopy on 08/30/2021.  The airway inspection was significant for redundant upper airway tissue and inflammation consistent with LPR.  She reports that her cough is about the same. She has used prometh-DM about bid. On flonase. She continues to have some daytime   Cell count 08/30/2021 shows intra and extracellular organisms.  Culture with Prevotella Malanagenica that is beta-lactamase positive.  Fungal stain and culture negative.  AFB stain negative  Split-night sleep study 08/12/2021 showed an AHI of 6.4/hour, 9.9/h in rem sleep, 85.7 while supine.  Consistent with mild OSA and mild oxygen desaturation. She did not meet split night criteria    Review of Systems  Constitutional:  Negative for fever and unexpected weight change.  HENT:  Negative for congestion, dental problem, ear pain, nosebleeds, postnasal drip, rhinorrhea, sinus pressure, sneezing, sore throat and trouble swallowing.   Eyes:  Negative for redness and itching.  Respiratory:  Positive for cough. Negative for chest tightness, shortness of breath and wheezing.   Cardiovascular:  Negative for palpitations and leg swelling.  Gastrointestinal:  Negative for nausea and vomiting.  Genitourinary:  Negative for dysuria.  Musculoskeletal:  Negative for joint swelling.  Skin:  Negative for rash.  Neurological:  Negative for headaches.  Hematological:  Does not bruise/bleed easily.  Psychiatric/Behavioral:  Negative for dysphoric mood. The patient is not nervous/anxious.     Past Medical History:  Diagnosis Date   Allergic rhinitis  08/16/2016   Benign essential hypertension 03/03/2015   Bone spur of right foot 02/08/2016   Bunion of left foot 02/08/2016   Carotid arterial disease 04/10/2016   03/2016: right 40-59%, left < 40%; 10/2020 carotid US: No evidence of B ICA stenosis   Chest pain 02/14/2017   Cyst of  left ovary 03/03/2015   Diverticulitis of colon without hemorrhage 02/25/2016   Epigastric pain 05/06/2020   Episodic tension-type headache 01/12/2016   Taking motrin 200 mg bid   GERD (gastroesophageal reflux disease)    History of cerebrovascular accident (CVA) with residual deficit 2001   Continue aspirin 81 mg daily, statin and current blood pressure medications; Blood pressure has been well controlled and cholesterol has been at goal   Hyperglycemia 02/14/2017   Chronic; encouraged low sugar/carbohydrate diet and regular exercise   Hyperlipidemia    diet controlled - no med   Leg neuralgia, right    Related to CVA    Lumbar back pain 05/10/2018   Lumbar radiculopathy 05/15/2018   Pain seems consistent with radiculopathy   MCI (mild cognitive impairment) with memory loss 12/31/2020   Midline cystocele    Mild asthma 08/27/2020   no inhaler   Onychomycosis 02/21/2018   Toenails; Ciclopirox prescribed   Sciatica    right   Sinus infection 03/22/2019   Sleep apnea    recently dx, appt on 09/15/21 with MD to discuss results/CPAP   Stroke [facility])    Mini strokes per patient   SVD (spontaneous vaginal delivery)    x 2   Tinnitus of right ear 11/16/2016   no current problem as of 08/26/21 per patient   Vitamin B12 deficiency 02/23/2018     Family History  Problem Relation Age of Onset   Breast cancer Sister    Diabetes Sister    Glaucoma Sister    Cataracts Sister    Hypertension Sister    Anuerysm Mother 12   Pancreatic cancer Father    Retinal detachment Brother    Cataracts Sister    Breast cancer Cousin    Anuerysm Cousin    Breast cancer Niece    Esophageal cancer Nephew    Developmental delay Nephew    Cerebral palsy Son        mild   Colon polyps Neg Hx    Prostate cancer Neg Hx    Rectal cancer Neg Hx    Stomach cancer Neg Hx    Uterine cancer Neg Hx      Social History   Socioeconomic History   Marital status: Single    Spouse name: Not on file    Number of children: 2   Years of education: 14   Highest education level: Associate degree: academic program  Occupational History   Occupation: Disability  Tobacco Use   Smoking status: Never   Smokeless tobacco: Never  Vaping Use   Vaping Use: Never used  Substance and Sexual Activity   Alcohol use: No    Alcohol/week: 0.0 standard drinks   Drug use: No   Sexual activity: Not on file    Comment: Hysterectomy  Other Topics Concern   Not on file  Social History Narrative   No regular exercise      Pt is right handed, she occasionally drinks tea, walks QOD. She lives with her 5 yr old son, he has mild cerebral palsy.   Social Determinants of Health   Financial Resource Strain: Low Risk    Difficulty of Paying Living  Expenses: Not hard at all  Food Insecurity: No Food Insecurity   Worried About Charity fundraiser in the Last Year: Never true   Ran Out of Food in the Last Year: Never true  Transportation Needs: No Transportation Needs   Lack of Transportation (Medical): No   Lack of Transportation (Non-Medical): No  Physical Activity: Inactive   Days of Exercise per Week: 0 days   Minutes of Exercise per Session: 0 min  Stress: No Stress Concern Present   Feeling of Stress : Not at all  Social Connections: Moderately Integrated   Frequency of Communication with Friends and Family: More than three times a week   Frequency of Social Gatherings with Friends and Family: More than three times a week   Attends Religious Services: 1 to 4 times per year   Active Member of [employer] or Organizations: Yes   Attends Archivist Meetings: 1 to 4 times per year   Marital Status: Never married  Human resources officer Violence: Not At Risk   Fear of Current or Ex-Partner: No   Emotionally Abused: No   Physically Abused: No   Sexually Abused: No  has worked office work, 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 [place]  Allergies  Allergen Reactions   Benzonatate Other (See Comments)    Headaches    Darvon [Propoxyphene] Other (See Comments)    Gi problems   Ibuprofen     tinnitus   Morphine And Related Itching   Prednisone Other (See Comments)    Make her head hurt   Sulfa Antibiotics Hives   Tussionex Pennkinetic Er [Hydrocod Poli-Chlorphe Poli Er] Other (See Comments)    Nausea, vomiting   Buprenorphine Hcl Itching   Ivp Dye [Iodinated Contrast Media] Nausea And Vomiting   Prochlorperazine Edisylate Itching and Other (See Comments)    "Acts weird: Hallucinations"     Outpatient Medications Prior to Visit  Medication Sig Dispense Refill   acetaminophen (TYLENOL) 500 MG tablet Take 500 mg by mouth every 6 (six) hours as needed.     aspirin EC 81 MG tablet Take 1 tablet (81 mg total) by mouth in the morning. Swallow whole.     atorvastatin (LIPITOR) 10 MG tablet Take 1 tablet (10 mg total) by mouth at bedtime. 90 tablet 1   fluticasone (FLONASE) 50 MCG/ACT nasal spray Place 2 sprays into both nostrils daily in the afternoon.     hydrochlorothiazide (HYDRODIURIL) 25 MG tablet Take 1 tablet (25 mg total) by mouth at bedtime.     Menthol (RICOLA) LOZG Use as directed 1 lozenge in the mouth or throat as needed (cough).     metoprolol succinate (TOPROL-XL) 100 MG 24 hr tablet Take 1 tablet (100 mg total) by mouth at bedtime. 90 tablet 1   promethazine-dextromethorphan (PROMETHAZINE-DM) 6.25-15 MG/5ML syrup Take 5 mLs by mouth every 6 (six) hours as needed for cough. 473 mL 0   vitamin B-12 (CYANOCOBALAMIN) 1000 MCG tablet Take 1,000 mcg by mouth in the morning. VitaFusion     No facility-administered medications prior to visit.        Objective:   Physical Exam Vitals:   09/15/21 1108  BP: 126/82  Pulse: 70  Temp: 98 F (36.7 C)  TempSrc: Oral  SpO2: 96%  Weight: 200 lb 3.2 oz (90.8 kg)  Height: 5' 5.5" (1.664 m)   Gen: Pleasant, well-nourished, in no distress,  normal affect  ENT: No lesions,  mouth clear,  oropharynx clear, no postnasal drip, strong voice, throat  clearing  Neck: No JVD, no stridor  Lungs: No use of accessory muscles, no crackles or wheezing on normal respiration, no wheeze on forced expiration  Cardiovascular: RRR, heart sounds normal, no murmur or gallops, no peripheral edema  Musculoskeletal: No deformities, no cyanosis or clubbing  Neuro: alert, awake, non focal  Skin: Warm, no lesions or rash      Assessment & Plan:  Chronic cough Continues to have cough, may be slightly less bothersome.  Bronchoscopy was reassuring with some evidence for LPR and upper airway leukoplakia, redundant tissue.  Also with tracheobronchomalacia probably a result of her cough.  Interestingly her cultures grew out Prevotella, unclear significance.  There was beta-lactamase positivity on the culture.  Reasonable to treat this with Augmentin to see if it makes any impact.  She agrees.  We will continue her fluticasone nasal spray.  She has not benefited in the past from PPI although we may need to retry this at some point going forward especially given the bronchoscopy observations.  I will refill her Promethazine DM cough syrup.    Obstructive sleep apnea Mild OSA noted on her PSG.  CPAP was not placed on a split-night study.  She is willing to do a trial of CPAP and I will order AutoSet 8-20 cm water, heated humidity, best fit mask.  She will need follow-up with me 60-90 days after to confirm compliance.  Baltazar Apo, MD, PhD 09/15/2021, 1:57 PM [facility] [phone] or if no answer [phone]

## 2021-09-16 ENCOUNTER — Telehealth: Payer: Self-pay | Admitting: Emergency Medicine

## 2021-09-16 MED ORDER — PROMETHAZINE-DM 6.25-15 MG/5ML PO SYRP: 5.0000 mL | ORAL_SOLUTION | Freq: Four times a day (QID) | ORAL | 0 refills | Status: DC | PRN

## 2021-09-16 NOTE — Telephone Encounter (Signed)
Refill sent.

## 2021-09-16 NOTE — Telephone Encounter (Signed)
Called and spoke with patient. Patient requesting promethazine cough med be called in at [facility].  Patient stated it was suppose to be sent in at [facility] 09/15/21 with Dr. Delton Coombes.   AVS 09/15/21- Instructions  Please take Augmentin 875 mg twice a day for 7 days. We will refill your promethazine cough syrup today. Please continue to use your Flonase nasal spray as you have been taking it. We will start CPAP treatment.  We will order this for you today. Follow with Dr. Delton Coombes in 2 months or sooner if you have any problems.      Message sent to Dr. Delton Coombes

## 2021-09-16 NOTE — Telephone Encounter (Signed)
Called and spoke with pt letting her know that RB sent Rx for cough med to pharmacy for her and she verbalized understanding. Nothing further needed.

## 2021-09-24 ENCOUNTER — Ambulatory Visit: Payer: Medicare PPO | Admitting: Allergy

## 2021-09-24 ENCOUNTER — Other Ambulatory Visit: Payer: Self-pay

## 2021-09-24 ENCOUNTER — Encounter: Payer: Self-pay | Admitting: Internal Medicine

## 2021-09-24 ENCOUNTER — Ambulatory Visit: Payer: Medicare PPO | Admitting: Internal Medicine

## 2021-09-24 ENCOUNTER — Ambulatory Visit (INDEPENDENT_AMBULATORY_CARE_PROVIDER_SITE_OTHER): Payer: Medicare PPO

## 2021-09-24 DIAGNOSIS — M79671 Pain in right foot: Secondary | ICD-10-CM | POA: Insufficient documentation

## 2021-09-24 DIAGNOSIS — M549 Dorsalgia, unspecified: Secondary | ICD-10-CM | POA: Diagnosis not present

## 2021-09-24 DIAGNOSIS — M542 Cervicalgia: Secondary | ICD-10-CM | POA: Diagnosis not present

## 2021-09-24 DIAGNOSIS — R202 Paresthesia of skin: Secondary | ICD-10-CM | POA: Diagnosis not present

## 2021-09-24 MED ORDER — METHYLPREDNISOLONE 4 MG PO TBPK: ORAL_TABLET | ORAL | 0 refills | Status: DC

## 2021-09-24 MED ORDER — CYCLOBENZAPRINE HCL 5 MG PO TABS: 5.0000 mg | ORAL_TABLET | Freq: Three times a day (TID) | ORAL | 1 refills | Status: DC | PRN

## 2021-09-24 MED ORDER — TRAMADOL HCL 50 MG PO TABS
50.0000 mg | ORAL_TABLET | Freq: Four times a day (QID) | ORAL | 0 refills | Status: DC | PRN
Start: 2021-09-24 — End: 2021-12-19

## 2021-09-24 NOTE — Assessment & Plan Note (Signed)
I suspect underlying flare of cspine djd/ddd - for tramadol prn, predpac asd, flexeril prn,  to f/u any worsening symptoms or concerns

## 2021-09-24 NOTE — Assessment & Plan Note (Signed)
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

## 2021-09-24 NOTE — Patient Instructions (Signed)
Please take all new medication as prescribed - the pain medication, steroid pill, and muscle relaxer as needed  Please continue all other medications as before, and refills have been done if requested.  Please have the pharmacy call with any other refills you may need.  Please keep your appointments with your specialists as you may have planned  Please go to the XRAY Department in the first floor for the x-ray testing - just the right foot today  You will be contacted by phone if any changes need to be made immediately.  Otherwise, you will receive a letter about your results with an explanation, but please check with MyChart first.  Please remember to sign up for MyChart if you have not done so, as this will be important to you in the future with finding out test results, communicating by private email, and scheduling acute appointments online when needed.  Please consider follow up with Dr Lawerance Bach or Sports Medicine on the first floor if not improved in 3-5 days

## 2021-09-24 NOTE — Progress Notes (Signed)
Patient ID: Kathleen Solis, female   DOB: 08-23-1965, 56 y.o.   MRN: [id number]        Chief Complaint: follow up left neck pain, left arm tingling, and right mid foot pain       HPI:  Kathleen Solis is a 56 y.o. female here with c/o sudden onset left post lateral neck and left upper trapezius pain and swelling with pain radiating to the lower back but also tingling to the LUE (without other pain or weakness), overall moderate to occasional severe, intermittent, sharp and dull, worse to turn head to left, nothing else makes better or worse, no unusual lifting or activity to cauase this.  No falls.  Does also incidentally at the same time c/o right mid foot pain x 1 wk, no trauma, worse to stand, mod to severe, worse to the plantar aspect with some tenderness to touch, worse to stand and walk, better to sit. Has some swelling mild as well.     BP Readings from Last 3 Encounters:  09/24/21 108/60  09/15/21 126/82  08/30/21 114/80         Past Medical History:  Diagnosis Date   Allergic rhinitis 08/16/2016   Benign essential hypertension 03/03/2015   Bone spur of right foot 02/08/2016   Bunion of left foot 02/08/2016   Carotid arterial disease 04/10/2016   03/2016: right 40-59%, left < 40%; 10/2020 carotid US: No evidence of B ICA stenosis   Chest pain 02/14/2017   Cyst of left ovary 03/03/2015   Diverticulitis of colon without hemorrhage 02/25/2016   Epigastric pain 05/06/2020   Episodic tension-type headache 01/12/2016   Taking motrin 200 mg bid   GERD (gastroesophageal reflux disease)    History of cerebrovascular accident (CVA) with residual deficit 2001   Continue aspirin 81 mg daily, statin and current blood pressure medications; Blood pressure has been well controlled and cholesterol has been at goal   Hyperglycemia 02/14/2017   Chronic; encouraged low sugar/carbohydrate diet and regular exercise   Hyperlipidemia    diet controlled - no med   Leg neuralgia, right    Related to  CVA    Lumbar back pain 05/10/2018   Lumbar radiculopathy 05/15/2018   Pain seems consistent with radiculopathy   MCI (mild cognitive impairment) with memory loss 12/31/2020   Midline cystocele    Mild asthma 08/27/2020   no inhaler   Onychomycosis 02/21/2018   Toenails; Ciclopirox prescribed   Sciatica    right   Sinus infection 03/22/2019   Sleep apnea    recently dx, appt on 09/15/21 with MD to discuss results/CPAP   Stroke [facility])    Mini strokes per patient   SVD (spontaneous vaginal delivery)    x 2   Tinnitus of right ear 11/16/2016   no current problem as of 08/26/21 per patient   Vitamin B12 deficiency 02/23/2018   Past Surgical History:  Procedure Laterality Date   ABDOMINAL HYSTERECTOMY     partial   ANAL FISSURE REPAIR     APPENDECTOMY     BRONCHIAL WASHINGS  08/30/2021   Procedure: BRONCHIAL WASHINGS;  Surgeon: Leslye Peer, MD;  Location: [facility] ENDOSCOPY;  Service: Cardiopulmonary;;   CHOLECYSTECTOMY     CYSTOSCOPY N/A 10/04/2017   Procedure: Bluford Kaufmann;  Surgeon: Gerald Leitz, MD;  Location: [facility] ORS;  Service: Gynecology;  Laterality: N/A;   FOOT SURGERY Left    big toe bunion removed   LAPAROSCOPIC BILATERAL SALPINGO OOPHERECTOMY Bilateral 10/04/2017   Procedure: LAPAROSCOPIC BILATERAL  SALPINGO OOPHORECTOMY, PELVIC WASHINGS;  Surgeon: Gerald Leitzole, Tara, MD;  Location: [facility] ORS;  Service: Gynecology;  Laterality: Bilateral;   SACRAL COLPOPEXY ROBOTIC, posterior repair, cystoscopy  [facility]   TUBAL LIGATION     VIDEO BRONCHOSCOPY N/A 08/30/2021   Procedure: VIDEO BRONCHOSCOPY WITHOUT FLUORO;  Surgeon: Leslye PeerByrum, Robert S, MD;  [facility];  Service: Cardiopulmonary;  Laterality: N/A;   WISDOM TOOTH EXTRACTION      reports that she has never smoked. She has never used smokeless tobacco. She reports that she does not drink alcohol and does not use drugs. family history includes Anuerysm in her cousin; Anuerysm (age of onset: 6047) in her mother; Breast cancer in  her cousin, niece, and sister; Cataracts in her sister and sister; Cerebral palsy in her son; Developmental delay in her nephew; Diabetes in her sister; Esophageal cancer in her nephew; Glaucoma in her sister; Hypertension in her sister; Pancreatic cancer in her father; Retinal detachment in her brother. Allergies  Allergen Reactions   Benzonatate Other (See Comments)    Headaches   Darvon [Propoxyphene] Other (See Comments)    Gi problems   Ibuprofen     tinnitus   Morphine And Related Itching   Prednisone Other (See Comments)    Make her head hurt   Sulfa Antibiotics Hives   Tussionex Pennkinetic Er [Hydrocod Poli-Chlorphe Poli Er] Other (See Comments)    Nausea, vomiting   Buprenorphine Hcl Itching   Ivp Dye [Iodinated Contrast Media] Nausea And Vomiting   Prochlorperazine Edisylate Itching and Other (See Comments)    "Acts weird: Hallucinations"   Current Outpatient Medications on File Prior to Visit  Medication Sig Dispense Refill   acetaminophen (TYLENOL) 500 MG tablet Take 500 mg by mouth every 6 (six) hours as needed.     amoxicillin-clavulanate (AUGMENTIN) 875-125 MG tablet Take 1 tablet by mouth 2 (two) times daily. 14 tablet 0   aspirin EC 81 MG tablet Take 1 tablet (81 mg total) by mouth in the morning. Swallow whole.     atorvastatin (LIPITOR) 10 MG tablet Take 1 tablet (10 mg total) by mouth at bedtime. 90 tablet 1   fluticasone (FLONASE) 50 MCG/ACT nasal spray Place 2 sprays into both nostrils daily in the afternoon.     hydrochlorothiazide (HYDRODIURIL) 25 MG tablet Take 1 tablet (25 mg total) by mouth at bedtime.     Menthol (RICOLA) LOZG Use as directed 1 lozenge in the mouth or throat as needed (cough).     metoprolol succinate (TOPROL-XL) 100 MG 24 hr tablet Take 1 tablet (100 mg total) by mouth at bedtime. 90 tablet 1   promethazine-dextromethorphan (PROMETHAZINE-DM) 6.25-15 MG/5ML syrup Take 5 mLs by mouth every 6 (six) hours as needed for cough. 473 mL 0    vitamin B-12 (CYANOCOBALAMIN) 1000 MCG tablet Take 1,000 mcg by mouth in the morning. VitaFusion     No current facility-administered medications on file prior to visit.        ROS:  All others reviewed and negative.  Objective        PE:  BP 108/60 (BP Location: Right Arm, Patient Position: Sitting, Cuff Size: Large)    Pulse 62    Temp 98.6 F (37 C) (Oral)    Ht 5' 5.5" (1.664 m)    Wt 199 lb 12.8 oz (90.6 kg)    SpO2 96%    BMI 32.74 kg/m  Constitutional: Pt appears in NAD               HENT: Head: NCAT.                Right Ear: External ear normal.                 Left Ear: External ear normal.                Eyes: . Pupils are equal, round, and reactive to light. Conjunctivae and EOM are normal               Nose: without d/c or deformity               Neck: left post lat neck tender with diffuse non discrete swelling to the area and left upper assoc trapezius               Cardiovascular: Normal rate and regular rhythm.                 Pulmonary/Chest: Effort normal and breath sounds without rales or wheezing.                Abd:  Soft, NT, ND, + BS, no organomegaly               Neurological: Pt is alert. At baseline orientation, motor grossly intact               Skin: Skin is warm. No rashes, no other new lesions, LE edema - none               Right mid foot diffuse trace to 1+ swelling and tender mid foot at the instep without overlying skin change               Psychiatric: Pt behavior is normal without agitation   Micro: none  Cardiac tracings I have personally interpreted today:  none  Pertinent Radiological findings (summarize): none   Lab Results  Component Value Date   WBC 6.9 08/30/2021   HGB 14.2 08/30/2021   HCT 39.7 08/30/2021   PLT 248 08/30/2021   GLUCOSE 110 (H) 08/30/2021   CHOL 133 11/03/2020   TRIG 82.0 11/03/2020   HDL 57.30 11/03/2020   LDLCALC 60 11/03/2020   ALT 21 05/08/2021   AST 27 05/08/2021   NA 138 08/30/2021   K 3.4  (L) 08/30/2021   CL 101 08/30/2021   CREATININE 0.75 08/30/2021   BUN 16 08/30/2021   CO2 25 08/30/2021   TSH 0.88 11/03/2020   INR 1.18 11/24/2015   HGBA1C 5.1 11/03/2020   Assessment/Plan:  Kathleen BorerDelores Armitage is a [age] y.o. Black or African American [2] female with  has a past medical history of Allergic rhinitis (08/16/2016), Benign essential hypertension (03/03/2015), Bone spur of right foot (02/08/2016), Bunion of left foot (02/08/2016), Carotid arterial disease (04/10/2016), Chest pain (02/14/2017), Cyst of left ovary (03/03/2015), Diverticulitis of colon without hemorrhage (02/25/2016), Epigastric pain (05/06/2020), Episodic tension-type headache (01/12/2016), GERD (gastroesophageal reflux disease), History of cerebrovascular accident (CVA) with residual deficit (2001), Hyperglycemia (02/14/2017), Hyperlipidemia, Leg neuralgia, right, Lumbar back pain (05/10/2018), Lumbar radiculopathy (05/15/2018), MCI (mild cognitive impairment) with memory loss (12/31/2020), Midline cystocele, Mild asthma (08/27/2020), Onychomycosis (02/21/2018), Sciatica, Sinus infection (03/22/2019), Sleep apnea, Stroke (HCC), SVD (spontaneous vaginal delivery), Tinnitus of right ear (11/16/2016), and Vitamin B12 deficiency (02/23/2018).  Neck pain on left side I suspect underlying flare of cspine djd/ddd - for tramadol prn, predpac  asd, flexeril prn,  to f/u any worsening symptoms or concerns  Paresthesia of left arm Likely has some assoc neuritis assoc with the left post lat neck predicament, but no other focal weakness or other, continue to follow, consider f/u sport med for persistent or worsening  Right foot pain eitology unclear, not likely associated with left neck issue, cant r/o stress fx or other - for right foot xray, pain control as above  Followup: Return if symptoms worsen or fail to improve.  Oliver Barre, MD 09/24/2021 10:04 PM [facility] Internal  Medicine

## 2021-09-24 NOTE — Assessment & Plan Note (Signed)
eitology unclear, not likely associated with left neck issue, cant r/o stress fx or other - for right foot xray, pain control as above

## 2021-09-28 LAB — FUNGUS CULTURE WITH STAIN

## 2021-09-28 LAB — FUNGUS CULTURE RESULT

## 2021-09-28 LAB — FUNGAL ORGANISM REFLEX

## 2021-10-15 LAB — ACID FAST CULTURE WITH REFLEXED SENSITIVITIES (MYCOBACTERIA): Acid Fast Culture: NEGATIVE

## 2021-10-25 ENCOUNTER — Telehealth: Payer: Self-pay | Admitting: Emergency Medicine

## 2021-10-26 NOTE — Telephone Encounter (Signed)
Spoke to patient.  ?She stated that her cpap machine is causing her to be stopped up and stuffy. She uses heated humidity. ?She would like Dr. Delton Coombes to review compliance report and let her know what her AHI is.  ? ?Dr. Delton Coombes, please advise. Thanks ?

## 2021-10-27 DIAGNOSIS — R800 Isolated proteinuria: Secondary | ICD-10-CM | POA: Diagnosis not present

## 2021-10-27 DIAGNOSIS — N393 Stress incontinence (female) (male): Secondary | ICD-10-CM | POA: Insufficient documentation

## 2021-10-27 DIAGNOSIS — R82998 Other abnormal findings in urine: Secondary | ICD-10-CM | POA: Diagnosis not present

## 2021-10-27 DIAGNOSIS — M6289 Other specified disorders of muscle: Secondary | ICD-10-CM

## 2021-10-27 HISTORY — DX: Other specified disorders of muscle: M62.89

## 2021-10-27 HISTORY — DX: Stress incontinence (female) (male): N39.3

## 2021-10-27 NOTE — Telephone Encounter (Signed)
We will work on getting a download from her CPAP.  ?She could try using nasal saline rinses in addition to her flonase to help keep nasal passages clear  ?

## 2021-10-29 ENCOUNTER — Other Ambulatory Visit: Payer: Self-pay | Admitting: Internal Medicine

## 2021-10-29 DIAGNOSIS — E782 Mixed hyperlipidemia: Secondary | ICD-10-CM

## 2021-10-31 ENCOUNTER — Encounter: Payer: Self-pay | Admitting: Internal Medicine

## 2021-10-31 NOTE — Patient Instructions (Signed)
     Blood work was ordered.     Medications changes include :   none   Your prescription(s) have been sent to your pharmacy.    A referral was ordered for [facility] for a colonoscopy.     Someone from that office will call you to schedule an appointment.    Return in about 6 months (around 09/22/2022) for Physical Exam.   [facility] Phone: [phone]  

## 2021-10-31 NOTE — Progress Notes (Unsigned)
Subjective:    Patient ID: Kathleen Solis, female    DOB: 07/01/1966, 56 y.o.   MRN: [id number]   This visit occurred during the SARS-CoV-2 public health emergency.  Safety protocols were in place, including screening questions prior to the visit, additional usage of staff PPE, and extensive cleaning of exam room while observing appropriate contact time as indicated for disinfecting solutions.    HPI Kathleen Solis is here for No chief complaint on file.    Medications and allergies reviewed with patient and updated if appropriate.    Current Outpatient Medications on File Prior to Visit  Medication Sig Dispense Refill   acetaminophen (TYLENOL) 500 MG tablet Take 500 mg by mouth every 6 (six) hours as needed.     amoxicillin-clavulanate (AUGMENTIN) 875-125 MG tablet Take 1 tablet by mouth 2 (two) times daily. 14 tablet 0   aspirin EC 81 MG tablet Take 1 tablet (81 mg total) by mouth in the morning. Swallow whole.     atorvastatin (LIPITOR) 10 MG tablet Take 1 tablet (10 mg total) by mouth at bedtime. 90 tablet 1   cyclobenzaprine (FLEXERIL) 5 MG tablet Take 1 tablet (5 mg total) by mouth 3 (three) times daily as needed for muscle spasms. 40 tablet 1   fluticasone (FLONASE) 50 MCG/ACT nasal spray Place 2 sprays into both nostrils daily in the afternoon.     hydrochlorothiazide (HYDRODIURIL) 25 MG tablet Take 1 tablet (25 mg total) by mouth at bedtime.     Menthol (RICOLA) LOZG Use as directed 1 lozenge in the mouth or throat as needed (cough).     methylPREDNISolone (MEDROL DOSEPAK) 4 MG TBPK tablet 4 tab by mouth x 3day, 3 tabs x 3 days 21 tablet 0   metoprolol succinate (TOPROL-XL) 100 MG 24 hr tablet Take 1 tablet (100 mg total) by mouth at bedtime. 90 tablet 1   promethazine-dextromethorphan (PROMETHAZINE-DM) 6.25-15 MG/5ML syrup Take 5 mLs by mouth every 6 (six) hours as needed for cough. 473 mL 0   traMADol (ULTRAM) 50 MG tablet Take 1 tablet (50 mg total) by mouth every 6 (six)  hours as needed. 30 tablet 0   vitamin B-12 (CYANOCOBALAMIN) 1000 MCG tablet Take 1,000 mcg by mouth in the morning. VitaFusion     No current facility-administered medications on file prior to visit.    Review of Systems     Objective:  There were no vitals filed for this visit. There were no vitals filed for this visit. There is no height or weight on file to calculate BMI.  BP Readings from Last 3 Encounters:  09/24/21 108/60  09/15/21 126/82  08/30/21 114/80    Wt Readings from Last 3 Encounters:  09/24/21 199 lb 12.8 oz (90.6 kg)  09/15/21 200 lb 3.2 oz (90.8 kg)  08/30/21 201 lb 1 oz (91.2 kg)    Depression screen [facility] 2/9 05/06/2021 11/03/2020  Decreased Interest 0 0  Down, Depressed, Hopeless 0 0  PHQ - 2 Score 0 0  Altered sleeping - 1  Tired, decreased energy - 1  Change in appetite - 1  Feeling bad or failure about yourself  - 0  Trouble concentrating - 1  Moving slowly or fidgety/restless - 0  Suicidal thoughts - 0  PHQ-9 Score - 4  Difficult doing work/chores - Not difficult at all  Some recent data might be hidden     GAD 7 : Generalized Anxiety Score 11/03/2020  Nervous, Anxious, on Edge 0  Worry too  much - different things 0  Trouble relaxing 1  Restless 0  Easily annoyed or irritable 0  Afraid - awful might happen 0  Anxiety Difficulty Not difficult at all        Physical Exam Constitutional: She appears well-developed and well-nourished. No distress.  HENT:  Head: Normocephalic and atraumatic.  Right Ear: External ear normal. Normal ear canal and TM Left Ear: External ear normal.  Normal ear canal and TM Mouth/Throat: Oropharynx is clear and moist.  Eyes: Conjunctivae and EOM are normal.  Neck: Neck supple. No tracheal deviation present. No thyromegaly present.  No carotid bruit  Cardiovascular: Normal rate, regular rhythm and normal heart sounds.   No murmur heard.  No edema. Pulmonary/Chest: Effort normal and breath sounds normal. No  respiratory distress. She has no wheezes. She has no rales.  Breast: deferred   Abdominal: Soft. She exhibits no distension. There is no tenderness.  Lymphadenopathy: She has no cervical adenopathy.  Skin: Skin is warm and dry. She is not diaphoretic.  Psychiatric: She has a normal mood and affect. Her behavior is normal.     Lab Results  Component Value Date   WBC 6.9 08/30/2021   HGB 14.2 08/30/2021   HCT 39.7 08/30/2021   PLT 248 08/30/2021   GLUCOSE 110 (H) 08/30/2021   CHOL 133 11/03/2020   TRIG 82.0 11/03/2020   HDL 57.30 11/03/2020   LDLCALC 60 11/03/2020   ALT 21 05/08/2021   AST 27 05/08/2021   NA 138 08/30/2021   K 3.4 (L) 08/30/2021   CL 101 08/30/2021   CREATININE 0.75 08/30/2021   BUN 16 08/30/2021   CO2 25 08/30/2021   TSH 0.88 11/03/2020   INR 1.18 11/24/2015   HGBA1C 5.1 11/03/2020         Assessment & Plan:   Physical exam: Screening blood work  ordered Exercise   Weight   Substance abuse  none   Reviewed recommended immunizations.   Health Maintenance  Topic Date Due   TETANUS/TDAP  Never done   Zoster Vaccines- Shingrix (1 of 2) Never done   COVID-19 Vaccine (3 - Booster for Pfizer series) 08/11/2020   INFLUENZA VACCINE  11/19/2021 (Originally 03/22/2021)   MAMMOGRAM  08/18/2022   COLONOSCOPY (Pts 45-19yrs Insurance coverage will need to be confirmed)  04/06/2025   Hepatitis C Screening  Completed   HIV Screening  Completed   HPV VACCINES  Aged Out          See Problem List for Assessment and Plan of chronic medical problems.

## 2021-11-01 NOTE — Telephone Encounter (Signed)
Download has been printed and put in Dr. Kavin Leech cabinet for review. Will route message to him ?

## 2021-11-02 DIAGNOSIS — R062 Wheezing: Secondary | ICD-10-CM | POA: Diagnosis not present

## 2021-11-02 DIAGNOSIS — R0789 Other chest pain: Secondary | ICD-10-CM | POA: Diagnosis not present

## 2021-11-02 DIAGNOSIS — J069 Acute upper respiratory infection, unspecified: Secondary | ICD-10-CM | POA: Diagnosis not present

## 2021-11-03 ENCOUNTER — Encounter: Payer: Medicare PPO | Admitting: Internal Medicine

## 2021-11-03 DIAGNOSIS — Z0000 Encounter for general adult medical examination without abnormal findings: Secondary | ICD-10-CM

## 2021-11-03 DIAGNOSIS — E538 Deficiency of other specified B group vitamins: Secondary | ICD-10-CM

## 2021-11-03 DIAGNOSIS — I10 Essential (primary) hypertension: Secondary | ICD-10-CM

## 2021-11-03 DIAGNOSIS — G3184 Mild cognitive impairment, so stated: Secondary | ICD-10-CM

## 2021-11-03 DIAGNOSIS — E782 Mixed hyperlipidemia: Secondary | ICD-10-CM

## 2021-11-03 DIAGNOSIS — R739 Hyperglycemia, unspecified: Secondary | ICD-10-CM

## 2021-11-03 NOTE — Telephone Encounter (Signed)
Called patient and let her know that we received her CPAP download and we are just waiting for Dr Delton Coombes to review the download and we will call her back and let her know his response to the download. Patient verbalized understanding ?

## 2021-11-03 NOTE — Telephone Encounter (Signed)
Patient checking to see if we got the download for CPAP machine. Patient phone number is [phone]. ?

## 2021-11-08 ENCOUNTER — Ambulatory Visit: Payer: Medicare PPO | Admitting: Neurology

## 2021-11-08 DIAGNOSIS — R062 Wheezing: Secondary | ICD-10-CM | POA: Diagnosis not present

## 2021-11-08 DIAGNOSIS — R0989 Other specified symptoms and signs involving the circulatory and respiratory systems: Secondary | ICD-10-CM | POA: Diagnosis not present

## 2021-11-08 DIAGNOSIS — R051 Acute cough: Secondary | ICD-10-CM | POA: Diagnosis not present

## 2021-11-08 DIAGNOSIS — Z03818 Encounter for observation for suspected exposure to other biological agents ruled out: Secondary | ICD-10-CM | POA: Diagnosis not present

## 2021-11-12 ENCOUNTER — Ambulatory Visit: Payer: Medicare PPO | Admitting: Neurology

## 2021-11-12 NOTE — Telephone Encounter (Signed)
I looked for this download in review file - didn't see it.  ?

## 2021-11-15 ENCOUNTER — Telehealth: Payer: Self-pay | Admitting: Emergency Medicine

## 2021-11-15 NOTE — Telephone Encounter (Signed)
Spoke to patient.  ?She is requesting for 11/16/2021 to be virtual.  ? ?Dr. Delton Coombes, please advise. Thanks ?

## 2021-11-15 NOTE — Telephone Encounter (Signed)
Yes that is ok

## 2021-11-15 NOTE — Telephone Encounter (Signed)
Visit changed to mychart visit. ?Patient is aware and voiced her understanding.  ?Nothing further needed.  ? ?

## 2021-11-16 ENCOUNTER — Other Ambulatory Visit: Payer: Self-pay

## 2021-11-16 ENCOUNTER — Telehealth (INDEPENDENT_AMBULATORY_CARE_PROVIDER_SITE_OTHER): Payer: Medicare PPO | Admitting: Emergency Medicine

## 2021-11-16 ENCOUNTER — Encounter: Payer: Self-pay | Admitting: Emergency Medicine

## 2021-11-16 DIAGNOSIS — R053 Chronic cough: Secondary | ICD-10-CM | POA: Diagnosis not present

## 2021-11-16 DIAGNOSIS — G4733 Obstructive sleep apnea (adult) (pediatric): Secondary | ICD-10-CM | POA: Diagnosis not present

## 2021-11-16 MED ORDER — FLUTICASONE PROPIONATE 50 MCG/ACT NA SUSP: 2.0000 | Freq: Every day | NASAL | 3 refills | Status: DC

## 2021-11-16 NOTE — Addendum Note (Signed)
Addended by: Katrinka Blazing R on: 11/16/2021 11:37 AM ? ? Modules accepted: Orders ? ?

## 2021-11-16 NOTE — Progress Notes (Signed)
Virtual Visit via Video Note ? ?I connected with Kathleen Solis on 11/16/21 at 11:00 AM EDT by a video enabled telemedicine application and verified that I am speaking with the correct person using two identifiers. ? ?Location: ?Patient: Home ?Provider: Office ?  ?I discussed the limitations of evaluation and management by telemedicine and the availability of in person appointments. The patient expressed understanding and agreed to proceed. ? ?History of Present Illness: ?56 year old woman with chronic cough, mild obstructive lung disease consistent with mild intermittent asthma impacted by GERD and chronic rhinitis. ?Also history of mild obstructive sleep apnea noted on a split-night sleep study.  We did a trial of CPAP but she reports that she had difficulty tolerating ?  ?Observations/Objective: ?Trial of CPAP.  Difficulty tolerating, more upper airway obstruction and congestion.  She stopped it and gained the device back. ?She is having more nasal gtt, HA, increased cough (usually dry) ?She is on flonase. Has promethazine/DM prn.  ?She is using albuterol about once a day.  ?Had to go to urgent care earlier in the month, no prednisone. She is off reflux medicine.  ? ?Assessment and Plan: ?Chronic cough, mild obstructive lung disease.  Flaring right now due to the increased allergy season.  Minimal albuterol use. ?-Increase Flonase to 2 puffs each nostril twice a day through the allergy season, then go back to once daily.  She needs a refill for this. ?-Start loratadine 10 mg once daily. ?-Continue promethazine/DM as needed for cough.  She will call if/when she needs a refill ?-Albuterol as needed ? ?Mild obstructive sleep apnea (AHI 6/h) ?-Could not tolerate CPAP.  For now I think it is okay to stop it, work on lifestyle modification, weight loss and diet. ? ?Follow Up Instructions: ?6 months ?  ?I discussed the assessment and treatment plan with the patient. The patient was provided an opportunity to ask  questions and all were answered. The patient agreed with the plan and demonstrated an understanding of the instructions. ?  ?The patient was advised to call back or seek an in-person evaluation if the symptoms worsen or if the condition fails to improve as anticipated. ? ?I provided 24 minutes of non-face-to-face time during this encounter. ? ? ?Leslye Peer, MD  ?

## 2021-11-17 ENCOUNTER — Encounter: Payer: Medicare PPO | Admitting: Internal Medicine

## 2021-11-24 ENCOUNTER — Telehealth: Payer: Self-pay | Admitting: Emergency Medicine

## 2021-11-25 MED ORDER — PROMETHAZINE-DM 6.25-15 MG/5ML PO SYRP
5.0000 mL | ORAL_SOLUTION | Freq: Four times a day (QID) | ORAL | 0 refills | Status: DC | PRN
Start: 2021-11-25 — End: 2022-02-02

## 2021-11-25 MED ORDER — LORATADINE 10 MG PO TABS: 10.0000 mg | ORAL_TABLET | Freq: Every day | ORAL | 11 refills | Status: DC

## 2021-11-25 NOTE — Telephone Encounter (Signed)
Called and spoke with patient. She is aware that the RX has been sent. Nothing further needed at time of call.  ?

## 2021-11-25 NOTE — Telephone Encounter (Signed)
OK I sent it. 

## 2021-11-25 NOTE — Telephone Encounter (Signed)
Spoke with the pt  ?She states that she is needing rx for claritin and prometh/codeine syrup  ?I sent the claritin ?RB, please let us know once the cough syrup has been sent  ?You advised that she continue use of this during recent virtual visit 11/16/21  ?Last dispensed #473 ml on 09/16/21 ?

## 2021-11-25 NOTE — Telephone Encounter (Signed)
Sorry, yes.

## 2021-11-25 NOTE — Telephone Encounter (Signed)
I see an order for promethazine / DM.  Is that the one she wants?  ?

## 2021-11-30 NOTE — Telephone Encounter (Signed)
Marchelle Folks, please advise if this was ever received. ?

## 2021-12-14 NOTE — Progress Notes (Signed)
? ?NEUROLOGY FOLLOW UP OFFICE NOTE ? ?Kathleen Solis ?[id number] ? ?Assessment/Plan:  ? ?Mild neurocognitive disorder - unclear etiology but may be vascular due to lack of alternative conditions. ?History of left hemispheric stroke ?Chronic tension-type headache complicated by medication overuse and OSA - declines medication at this time. ?Obstructive sleep apnea ? ?  ?For headache - Limit use of pain relievers (such as tylenol) to no more than 2 days out of week to prevent rebound headache ?Secondary stroke prevention as managed by PCP: ?            - ASA 81mg  daily ?            - LDL goal less than 70 ?            - Hgb A1c goal less than 7 ?            - Normotensive blood pressure ?Weight loss to treat OSA ?Repreat neuropsychological evaluation in one year. ?Follow up 6 months ?  ?  ?Subjective:  ?Kathleen Solis is a 56 year old right-handed woman with hypertension, hyperlipidemia, carotid artery disease, tension type headache and history of stroke who follows up for cognitive deficits. ?  ?UPDATE: ?For daily headaches, she was started on nortriptyline and advised to limit analgesics to no more than 2 days out of the week.  She never started the nortriptyline due to concerns of potential side effects.  She was seen by pulmonology for sleep apnea.  Tried CPAP but stopped because she was unable to tolerate it.  Plan now is to try and lose weight.  However, headaches are overall improved. However, due to seasonal allergies, they are frequent but mild.   ? ?Memory remains a concern. She often forgets what she wanted to say or recall.   ? ?Current NSAIDS:  She stopped her ASA.  She is concerned about possible side effects.  ?Current analgesics:  Takes Extra-Strength Tylenol for the headaches. ?Current triptans:  none ?Current ergotamine:  none ?Current anti-emetic:  none ?Current muscle relaxants:  none ?Current anti-anxiolytic:  none ?Current sleep aide:  none ?Current Antihypertensive medications:  Toprol XL;  HCTZ ?Current Antidepressant medications:  nortriptyline 10mg  at bedtime ?Current Anticonvulsant medications:  none ?Current anti-CGRP:  none ?Current Vitamins/Herbal/Supplements:  none ?Current Antihistamines/Decongestants:  Flonase ?Other therapy:  rest ? ?  ?HISTORY: ?In 2001, she had a stroke while pregnant with her son.  She was told it was due to elevated blood pressure.  She presented with slurred speech and right leg weakness.  As a result, her son was born with cerebral palsy.  She has been followed by multiple neurologists.   Since the stroke, she has been on ASA 81mg  daily.  Non-contrast MRI brain report from 02/15/14 demonstrated minimal scattered T2 hyperintensities in the left periventricular white matter regions suggesting chronic microvascular degenerative disease but no acute infarcts, mass lesions or demyelinating disease.  This was also noted on prior MRI from 2012. ?  ?She presented to [facility] on 11/24/15 for acute onset right sided headache and numbness with trouble walking.  CT of head was personally reviewed and revealed no acute findings.  MRI of brain was ordered but she left AMA.  She followed up with neurologist, Dr. 2013 the following month.  MRI of brain with and without contrast performed on 01/19/16 again demonstrated patchy nonspecific cerebral white matter signal changes in left frontal lobe and periatrial white matter, possibly indicative of remote ischemic stroke.  Abnormal flow in  the left cervical ICA noted as well.  Carotid doppler from 03/16/16 reportedly showed no hemodynamically significant stenosis.  A repeat MRI of the brain from 11/30/16 was personally reviewed to evaluate tinnitus in the right ear and again demonstrated chronic white matter changes, left greater than right, in watershed distribution, but no acute findings.  Carotid ultrasound on 11/13/2020 showed no hemodynamically significant ICA stenosis.  Echocardiogram on 12/30/2020 showed EF 72% with  no regional wall motion abnormalities or other abnormalities.   ?  ?She was treated for episodic tension type headaches as well.  The are moderate intensity non-throbbing headache on the top of her head.  They last 15 to 20 minutes and occur daily.  There are no associated symptoms such as nausea, photophobia, phonophobia or unilateral numbness or weakness.  She was always hesitant about starting a preventative.  She treated them with ibuprofen until she stopped because it was found to be the cause of her tinnitus. ?  ?She was previously on atorvastatin 40mg  but stopped due to leg pain. ?  ?She has been on disability since 2007.  She continues to have right leg weakness and trouble with speech.  She has memory deficits.  She is overall independent.  On one occasion, she got lost while driving on familiar route.  She usually uses a GPS.    Keeps losing her phone.  She is forgetting recent conversations.  B12 in March 2022 was 171.  She was advised to start 1000mcg daily.  MRI of brain without contrast on 11/28/2020 showed chronic left frontoparietal infarct but no acute intracranial abnormality.  She underwent neuropsychological evaluation in May 2022 showed evidence of mild neurocognitive disorder with variability across visuospatial abilities.  Etiology unclear - not completely convincing for vascular etiology but  at this time most likely given lack of alternatives (does not exhibit the profile of either Alzheimer's or Lewy Body dementia and does not exhibit evidence of demyelinating disease on MRI).   ? ? ?Since her stroke, she has insomnia.  She previously had a sleep study that demonstrated mild OSA not requiring CPAP.   She denies depression. ?  ?Past medications:  gabapentin ? ?PAST MEDICAL HISTORY: ?Past Medical History:  ?Diagnosis Date  ? Allergic rhinitis 08/16/2016  ? Benign essential hypertension 03/03/2015  ? Bone spur of right foot 02/08/2016  ? Bunion of left foot 02/08/2016  ? Carotid arterial disease  04/10/2016  ? 03/2016: right 40-59%, left < 40%; 10/2020 carotid US: No evidence of B ICA stenosis  ? Chest pain 02/14/2017  ? Cyst of left ovary 03/03/2015  ? Diverticulitis of colon without hemorrhage 02/25/2016  ? Epigastric pain 05/06/2020  ? Episodic tension-type headache 01/12/2016  ? Taking motrin 200 mg bid  ? GERD (gastroesophageal reflux disease)   ? History of cerebrovascular accident (CVA) with residual deficit 2001  ? Continue aspirin 81 mg daily, statin and current blood pressure medications; Blood pressure has been well controlled and cholesterol has been at goal  ? Hyperglycemia 02/14/2017  ? Chronic; encouraged low sugar/carbohydrate diet and regular exercise  ? Hyperlipidemia   ? diet controlled - no med  ? Leg neuralgia, right   ? Related to CVA   ? Lumbar back pain 05/10/2018  ? Lumbar radiculopathy 05/15/2018  ? Pain seems consistent with radiculopathy  ? MCI (mild cognitive impairment) with memory loss 12/31/2020  ? Midline cystocele   ? Mild asthma 08/27/2020  ? no inhaler  ? Onychomycosis 02/21/2018  ? Toenails; Ciclopirox prescribed  ?  Sciatica   ? right  ? Sinus infection 03/22/2019  ? Sleep apnea   ? recently dx, appt on 09/15/21 with MD to discuss results/CPAP  ? Stroke [facility])   ? Mini strokes per patient  ? SVD (spontaneous vaginal delivery)   ? x 2  ? Tinnitus of right ear 11/16/2016  ? no current problem as of 08/26/21 per patient  ? Vitamin B12 deficiency 02/23/2018  ? ? ?MEDICATIONS: ?Current Outpatient Medications on File Prior to Visit  ?Medication Sig Dispense Refill  ? acetaminophen (TYLENOL) 500 MG tablet Take 500 mg by mouth every 6 (six) hours as needed.    ? aspirin EC 81 MG tablet Take 1 tablet (81 mg total) by mouth in the morning. Swallow whole.    ? atorvastatin (LIPITOR) 10 MG tablet TAKE ONE TABLET BY MOUTH AT BEDTIME 90 tablet 1  ? cyclobenzaprine (FLEXERIL) 5 MG tablet Take 1 tablet (5 mg total) by mouth 3 (three) times daily as needed for muscle spasms. 40 tablet 1  ?  fluticasone (FLONASE) 50 MCG/ACT nasal spray Place 2 sprays into both nostrils daily in the afternoon. 18.2 mL 3  ? hydrochlorothiazide (HYDRODIURIL) 25 MG tablet TAKE ONE TABLET BY MOUTH DAILY 90 tablet 2

## 2021-12-15 ENCOUNTER — Ambulatory Visit (INDEPENDENT_AMBULATORY_CARE_PROVIDER_SITE_OTHER): Payer: Medicare PPO | Admitting: Neurology

## 2021-12-15 ENCOUNTER — Encounter: Payer: Self-pay | Admitting: Neurology

## 2021-12-15 VITALS — BP 118/76 | HR 64 | Resp 18 | Ht 64.0 in

## 2021-12-15 DIAGNOSIS — G4440 Drug-induced headache, not elsewhere classified, not intractable: Secondary | ICD-10-CM

## 2021-12-15 DIAGNOSIS — R634 Abnormal weight loss: Secondary | ICD-10-CM

## 2021-12-15 DIAGNOSIS — G4733 Obstructive sleep apnea (adult) (pediatric): Secondary | ICD-10-CM | POA: Diagnosis not present

## 2021-12-15 DIAGNOSIS — I6930 Unspecified sequelae of cerebral infarction: Secondary | ICD-10-CM

## 2021-12-15 DIAGNOSIS — G3184 Mild cognitive impairment, so stated: Secondary | ICD-10-CM | POA: Diagnosis not present

## 2021-12-15 NOTE — Patient Instructions (Signed)
Continue aspirin 81mg  daily ?Continue atorvastatin ?Continue blood pressure control ?Limit use of pain relievers to no more than 2 days out of week to prevent risk of rebound or medication-overuse headache. ?Work on weight loss to improve sleep apnea ?Follow up 6 months ?Schedule for repeat neuropsychological evaluation in one year.   ?

## 2021-12-16 ENCOUNTER — Telehealth: Payer: Self-pay | Admitting: Internal Medicine

## 2021-12-16 NOTE — Telephone Encounter (Signed)
Refills were sent in 3/23 and she canceled her appointment on 3/29.  Has not been seen since September.  Overdue for follow-up.  Please reach out to her and advise her she needs a follow-up appointment ?

## 2021-12-16 NOTE — Telephone Encounter (Signed)
Called back and scheduled for 12/21/21. ?

## 2021-12-16 NOTE — Telephone Encounter (Signed)
Message left for patient to return call to clinic and schedule CPE. She is due as of March. ? ?Also sent my-chart message as well to contact office for CPE. ?

## 2021-12-19 NOTE — Progress Notes (Signed)
? ? ?Subjective:  ? ? Patient ID: Kathleen Solis, female    DOB: 1965/08/27, 56 y.o.   MRN: [id number] ? ? ?This visit occurred during the SARS-CoV-2 public health emergency.  Safety protocols were in place, including screening questions prior to the visit, additional usage of staff PPE, and extensive cleaning of exam room while observing appropriate contact time as indicated for disinfecting solutions. ? ? ? ?HPI ?Kathleen Solis is here for  ?Chief Complaint  ?Patient presents with  ? Annual Exam  ? ? ?Discoloration in toenails.  No pain in the nails.  They are a little thicker.Reginold Agent of her right foot hurts in the arch area.  Bumps on side of her ankles that itch- especially on left.  No N/T.    ? ? ? ? ? ?Medications and allergies reviewed with patient and updated if appropriate. ? ? ?Current Outpatient Medications on File Prior to Visit  ?Medication Sig Dispense Refill  ? acetaminophen (TYLENOL) 500 MG tablet Take 500 mg by mouth every 6 (six) hours as needed.    ? aspirin EC 81 MG tablet Take 1 tablet (81 mg total) by mouth in the morning. Swallow whole.    ? atorvastatin (LIPITOR) 10 MG tablet TAKE ONE TABLET BY MOUTH AT BEDTIME 90 tablet 1  ? fluticasone (FLONASE) 50 MCG/ACT nasal spray Place 2 sprays into both nostrils daily in the afternoon. 18.2 mL 3  ? hydrochlorothiazide (HYDRODIURIL) 25 MG tablet TAKE ONE TABLET BY MOUTH DAILY 90 tablet 2  ? loratadine (CLARITIN) 10 MG tablet Take 1 tablet (10 mg total) by mouth daily. 30 tablet 11  ? metoprolol succinate (TOPROL-XL) 100 MG 24 hr tablet TAKE ONE TABLET BY MOUTH AT BEDTIME 90 tablet 1  ? promethazine-dextromethorphan (PROMETHAZINE-DM) 6.25-15 MG/5ML syrup Take 5 mLs by mouth every 6 (six) hours as needed for cough. 473 mL 0  ? albuterol (VENTOLIN HFA) 108 (90 Base) MCG/ACT inhaler Inhale into the lungs.    ? ipratropium (ATROVENT) 0.06 % nasal spray Place into both nostrils.    ? ?No current facility-administered medications on file prior to visit.   ? ? ?Review of Systems  ?Constitutional:  Negative for fever.  ?HENT:  Positive for postnasal drip.   ?Eyes:  Negative for visual disturbance.  ?Respiratory:  Positive for cough (chronic). Negative for shortness of breath and wheezing.   ?Cardiovascular:  Negative for chest pain, palpitations and leg swelling.  ?Gastrointestinal:  Negative for abdominal pain, blood in stool, constipation, diarrhea and nausea.  ?     No gerd  ?Genitourinary:  Negative for dysuria.  ?Musculoskeletal:  Positive for back pain and neck pain. Negative for arthralgias.  ?Skin:  Negative for rash.  ?Neurological:  Positive for headaches (occ). Negative for light-headedness.  ?Psychiatric/Behavioral:  Positive for sleep disturbance (interrupted). Negative for dysphoric mood. The patient is not nervous/anxious.   ? ?   ?Objective:  ? ?Vitals:  ? 12/21/21 0915  ?BP: 112/72  ?Pulse: (!) 56  ?Temp: 98.1 ?F (36.7 ?C)  ?SpO2: 97%  ? ?Filed Weights  ? 12/21/21 0915  ?Weight: 195 lb (88.5 kg)  ? ?Body mass index is 33.47 kg/m?. ? ?BP Readings from Last 3 Encounters:  ?12/21/21 112/72  ?12/15/21 118/76  ?09/24/21 108/60  ? ? ?Wt Readings from Last 3 Encounters:  ?12/21/21 195 lb (88.5 kg)  ?09/24/21 199 lb 12.8 oz (90.6 kg)  ?09/15/21 200 lb 3.2 oz (90.8 kg)  ? ? ? ?  12/21/2021  ?  9:39  AM 05/06/2021  ?  9:50 AM 11/03/2020  ? 10:15 AM 06/07/2017  ?  9:58 AM  ?Depression screen PHQ 2/9  ?Decreased Interest 0 0 0 0  ?Down, Depressed, Hopeless 0 0 0 0  ?PHQ - 2 Score 0 0 0 0  ?Altered sleeping   1   ?Tired, decreased energy   1   ?Change in appetite   1   ?Feeling bad or failure about yourself    0   ?Trouble concentrating   1   ?Moving slowly or fidgety/restless   0   ?Suicidal thoughts   0   ?PHQ-9 Score   4   ?Difficult doing work/chores   Not difficult at all   ? ? ? ? ?  11/03/2020  ? 10:16 AM  ?GAD 7 : Generalized Anxiety Score  ?Nervous, Anxious, on Edge 0  ?Worry too much - different things 0  ?Trouble relaxing 1  ?Restless 0  ?Easily annoyed or  irritable 0  ?Afraid - awful might happen 0  ?Anxiety Difficulty Not difficult at all  ? ? ? ? ?  ?Physical Exam ?Constitutional: She appears well-developed and well-nourished. No distress.  ?HENT:  ?Head: Normocephalic and atraumatic.  ?Right Ear: External ear normal. Normal ear canal and TM ?Left Ear: External ear normal.  Normal ear canal and TM ?Mouth/Throat: Oropharynx is clear and moist.  ?Eyes: Conjunctivae and EOM are normal.  ?Neck: Neck supple. No tracheal deviation present. No thyromegaly present.  ?No carotid bruit  ?Cardiovascular: Normal rate, regular rhythm and normal heart sounds.   ?No murmur heard.  No edema. ?Pulmonary/Chest: Effort normal and breath sounds normal. No respiratory distress. She has no wheezes. She has no rales.  ?Breast: deferred   ?Abdominal: Soft. She exhibits no distension. There is no tenderness.  ?Lymphadenopathy: She has no cervical adenopathy.  ?Skin: Skin is warm and dry. She is not diaphoretic.  ?Psychiatric: She has a normal mood and affect. Her behavior is normal.  ? ? ? ?Lab Results  ?Component Value Date  ? WBC 6.9 08/30/2021  ? HGB 14.2 08/30/2021  ? HCT 39.7 08/30/2021  ? PLT 248 08/30/2021  ? GLUCOSE 110 (H) 08/30/2021  ? CHOL 133 11/03/2020  ? TRIG 82.0 11/03/2020  ? HDL 57.30 11/03/2020  ? LDLCALC 60 11/03/2020  ? ALT 21 05/08/2021  ? AST 27 05/08/2021  ? NA 138 08/30/2021  ? K 3.4 (L) 08/30/2021  ? CL 101 08/30/2021  ? CREATININE 0.75 08/30/2021  ? BUN 16 08/30/2021  ? CO2 25 08/30/2021  ? TSH 0.88 11/03/2020  ? INR 1.18 11/24/2015  ? HGBA1C 5.1 11/03/2020  ? ? ? ? ?   ?Assessment & Plan:  ? ?Physical exam: ?Screening blood work  ordered ?Exercise  joined [facility] yesterday ?Weight  working on weight loss ?Substance abuse  none ? ? ?Reviewed recommended immunizations. ? ? ?Health Maintenance  ?Topic Date Due  ? COVID-19 Vaccine (3 - Booster for Pfizer series) 01/06/2022 (Originally 08/11/2020)  ? Zoster Vaccines- Shingrix (1 of 2) 03/23/2022 (Originally 12/13/2016)  ?  TETANUS/TDAP  12/22/2022 (Originally 12/13/1985)  ? INFLUENZA VACCINE  03/22/2022  ? MAMMOGRAM  08/18/2022  ? COLONOSCOPY (Pts 45-3yrs Insurance coverage will need to be confirmed)  04/06/2025  ? Hepatitis C Screening  Completed  ? HIV Screening  Completed  ? HPV VACCINES  Aged Out  ?  ? ? ? ? ? ? ?See Problem List for Assessment and Plan of chronic medical problems. ? ? ? ? ?

## 2021-12-19 NOTE — Patient Instructions (Addendum)
? ? ? ?Blood work was ordered.   ? ? ?Medications changes include :   none ? ? ? ?A referral was ordered for podiatry.     Someone from that office will call you to schedule an appointment.  ? ? ?Return in about 6 months (around 06/23/2022) for follow up. ? ? ? ?Health Maintenance, Female ?Adopting a healthy lifestyle and getting preventive care are important in promoting health and wellness. Ask your health care provider about: ?The right schedule for you to have regular tests and exams. ?Things you can do on your own to prevent diseases and keep yourself healthy. ?What should I know about diet, weight, and exercise? ?Eat a healthy diet ? ?Eat a diet that includes plenty of vegetables, fruits, low-fat dairy products, and lean protein. ?Do not eat a lot of foods that are high in solid fats, added sugars, or sodium. ?Maintain a healthy weight ?Body mass index (BMI) is used to identify weight problems. It estimates body fat based on height and weight. Your health care provider can help determine your BMI and help you achieve or maintain a healthy weight. ?Get regular exercise ?Get regular exercise. This is one of the most important things you can do for your health. Most adults should: ?Exercise for at least 150 minutes each week. The exercise should increase your heart rate and make you sweat (moderate-intensity exercise). ?Do strengthening exercises at least twice a week. This is in addition to the moderate-intensity exercise. ?Spend less time sitting. Even light physical activity can be beneficial. ?Watch cholesterol and blood lipids ?Have your blood tested for lipids and cholesterol at 55 years of age, then have this test every 5 years. ?Have your cholesterol levels checked more often if: ?Your lipid or cholesterol levels are high. ?You are older than 55 years of age. ?You are at high risk for heart disease. ?What should I know about cancer screening? ?Depending on your health history and family history, you may  need to have cancer screening at various ages. This may include screening for: ?Breast cancer. ?Cervical cancer. ?Colorectal cancer. ?Skin cancer. ?Lung cancer. ?What should I know about heart disease, diabetes, and high blood pressure? ?Blood pressure and heart disease ?High blood pressure causes heart disease and increases the risk of stroke. This is more likely to develop in people who have high blood pressure readings or are overweight. ?Have your blood pressure checked: ?Every 3-5 years if you are 41-3 years of age. ?Every year if you are 72 years old or older. ?Diabetes ?Have regular diabetes screenings. This checks your fasting blood sugar level. Have the screening done: ?Once every three years after age 48 if you are at a normal weight and have a low risk for diabetes. ?More often and at a younger age if you are overweight or have a high risk for diabetes. ?What should I know about preventing infection? ?Hepatitis B ?If you have a higher risk for hepatitis B, you should be screened for this virus. Talk with your health care provider to find out if you are at risk for hepatitis B infection. ?Hepatitis C ?Testing is recommended for: ?Everyone born from [birth year] through 1964. ?Anyone with known risk factors for hepatitis C. ?Sexually transmitted infections (STIs) ?Get screened for STIs, including gonorrhea and chlamydia, if: ?You are sexually active and are younger than 55 years of age. ?You are older than 55 years of age and your health care provider tells you that you are at risk for this type of  infection. ?Your sexual activity has changed since you were last screened, and you are at increased risk for chlamydia or gonorrhea. Ask your health care provider if you are at risk. ?Ask your health care provider about whether you are at high risk for HIV. Your health care provider may recommend a prescription medicine to help prevent HIV infection. If you choose to take medicine to prevent HIV, you should first get  tested for HIV. You should then be tested every 3 months for as long as you are taking the medicine. ?Pregnancy ?If you are about to stop having your period (premenopausal) and you may become pregnant, seek counseling before you get pregnant. ?Take 400 to 800 micrograms (mcg) of folic acid every day if you become pregnant. ?Ask for birth control (contraception) if you want to prevent pregnancy. ?Osteoporosis and menopause ?Osteoporosis is a disease in which the bones lose minerals and strength with aging. This can result in bone fractures. If you are 42 years old or older, or if you are at risk for osteoporosis and fractures, ask your health care provider if you should: ?Be screened for bone loss. ?Take a calcium or vitamin D supplement to lower your risk of fractures. ?Be given hormone replacement therapy (HRT) to treat symptoms of menopause. ?Follow these instructions at home: ?Alcohol use ?Do not drink alcohol if: ?Your health care provider tells you not to drink. ?You are pregnant, may be pregnant, or are planning to become pregnant. ?If you drink alcohol: ?Limit how much you have to: ?0-1 drink a day. ?Know how much alcohol is in your drink. In the [place] one drink equals one 12 oz bottle of beer (355 mL), one 5 oz glass of wine (148 mL), or one 1? oz glass of hard liquor (44 mL). ?Lifestyle ?Do not use any products that contain nicotine or tobacco. These products include cigarettes, chewing tobacco, and vaping devices, such as e-cigarettes. If you need help quitting, ask your health care provider. ?Do not use street drugs. ?Do not share needles. ?Ask your health care provider for help if you need support or information about quitting drugs. ?General instructions ?Schedule regular health, dental, and eye exams. ?Stay current with your vaccines. ?Tell your health care provider if: ?You often feel depressed. ?You have ever been abused or do not feel safe at home. ?Summary ?Adopting a healthy lifestyle and getting  preventive care are important in promoting health and wellness. ?Follow your health care provider's instructions about healthy diet, exercising, and getting tested or screened for diseases. ?Follow your health care provider's instructions on monitoring your cholesterol and blood pressure. ?This information is not intended to replace advice given to you by your health care provider. Make sure you discuss any questions you have with your health care provider. ?Document Revised: 12/28/2020 Document Reviewed: 12/28/2020 ?Elsevier Patient Education ? 2023 [employer] ? ?

## 2021-12-20 ENCOUNTER — Encounter: Payer: Self-pay | Admitting: Internal Medicine

## 2021-12-21 ENCOUNTER — Ambulatory Visit (INDEPENDENT_AMBULATORY_CARE_PROVIDER_SITE_OTHER): Payer: Medicare PPO | Admitting: Internal Medicine

## 2021-12-21 VITALS — BP 112/72 | HR 56 | Temp 98.1°F | Ht 64.0 in | Wt 195.0 lb

## 2021-12-21 DIAGNOSIS — M79672 Pain in left foot: Secondary | ICD-10-CM | POA: Diagnosis not present

## 2021-12-21 DIAGNOSIS — G3184 Mild cognitive impairment, so stated: Secondary | ICD-10-CM | POA: Diagnosis not present

## 2021-12-21 DIAGNOSIS — I6523 Occlusion and stenosis of bilateral carotid arteries: Secondary | ICD-10-CM | POA: Diagnosis not present

## 2021-12-21 DIAGNOSIS — E538 Deficiency of other specified B group vitamins: Secondary | ICD-10-CM

## 2021-12-21 DIAGNOSIS — I10 Essential (primary) hypertension: Secondary | ICD-10-CM | POA: Diagnosis not present

## 2021-12-21 DIAGNOSIS — E782 Mixed hyperlipidemia: Secondary | ICD-10-CM

## 2021-12-21 DIAGNOSIS — Z0000 Encounter for general adult medical examination without abnormal findings: Secondary | ICD-10-CM

## 2021-12-21 DIAGNOSIS — L602 Onychogryphosis: Secondary | ICD-10-CM

## 2021-12-21 DIAGNOSIS — R739 Hyperglycemia, unspecified: Secondary | ICD-10-CM

## 2021-12-21 DIAGNOSIS — J4520 Mild intermittent asthma, uncomplicated: Secondary | ICD-10-CM

## 2021-12-21 DIAGNOSIS — I6930 Unspecified sequelae of cerebral infarction: Secondary | ICD-10-CM | POA: Diagnosis not present

## 2021-12-21 LAB — LIPID PANEL
Cholesterol: 148 mg/dL (ref 0–200)
HDL: 54.2 mg/dL (ref >39.00)
LDL Cholesterol: 78 mg/dL (ref 0–99)
NonHDL: 93.68
Total CHOL/HDL Ratio: 3
Triglycerides: 78 mg/dL (ref 0.0–149.0)
VLDL: 15.6 mg/dL (ref 0.0–40.0)

## 2021-12-21 LAB — CBC WITH DIFFERENTIAL/PLATELET
Basophils Absolute: 0 10*3/uL (ref 0.0–0.1)
Basophils Relative: 0.6 % (ref 0.0–3.0)
Eosinophils Absolute: 0.1 10*3/uL (ref 0.0–0.7)
Eosinophils Relative: 1.1 % (ref 0.0–5.0)
HCT: 40.7 % (ref 36.0–46.0)
Hemoglobin: 14.4 g/dL (ref 12.0–15.0)
Lymphocytes Relative: 23.5 % (ref 12.0–46.0)
Lymphs Abs: 1.7 10*3/uL (ref 0.7–4.0)
MCHC: 35.3 g/dL (ref 30.0–36.0)
MCV: 92.1 fl (ref 78.0–100.0)
Monocytes Absolute: 0.5 10*3/uL (ref 0.1–1.0)
Monocytes Relative: 6.5 % (ref 3.0–12.0)
Neutro Abs: 4.9 10*3/uL (ref 1.4–7.7)
Neutrophils Relative %: 68.3 % (ref 43.0–77.0)
Platelets: 211 10*3/uL (ref 150.0–400.0)
RBC: 4.42 Mil/uL (ref 3.87–5.11)
RDW: 13.2 % (ref 11.5–15.5)
WBC: 7.1 10*3/uL (ref 4.0–10.5)

## 2021-12-21 LAB — COMPREHENSIVE METABOLIC PANEL
ALT: 21 U/L (ref 0–35)
AST: 26 U/L (ref 0–37)
Albumin: 4.4 g/dL (ref 3.5–5.2)
Alkaline Phosphatase: 101 U/L (ref 39–117)
BUN: 12 mg/dL (ref 6–23)
CO2: 31 mEq/L (ref 19–32)
Calcium: 10 mg/dL (ref 8.4–10.5)
Chloride: 99 mEq/L (ref 96–112)
Creatinine, Ser: 1.02 mg/dL (ref 0.40–1.20)
GFR: 62.15 mL/min (ref >60.00)
Glucose, Bld: 109 mg/dL — ABNORMAL HIGH (ref 70–99)
Potassium: 3.6 mEq/L (ref 3.5–5.1)
Sodium: 139 mEq/L (ref 135–145)
Total Bilirubin: 1.1 mg/dL (ref 0.2–1.2)
Total Protein: 8 g/dL (ref 6.0–8.3)

## 2021-12-21 LAB — TSH: TSH: 2.02 u[IU]/mL (ref 0.35–5.50)

## 2021-12-21 LAB — VITAMIN B12: Vitamin B-12: 515 pg/mL (ref 211–911)

## 2021-12-21 LAB — HEMOGLOBIN A1C: Hgb A1c MFr Bld: 5.1 % (ref 4.6–6.5)

## 2021-12-21 NOTE — Assessment & Plan Note (Addendum)
H/o CVA ?Continue ASA 81 mg daily, atorvastatin 10 mg daily, hctz 25 mg daily, metoprolol xl 100 mg daily ?Stressed regular exercise ?

## 2021-12-21 NOTE — Assessment & Plan Note (Signed)
Chronic ?Following with Dr. Tomi Likens ?Stressed regular exercise physical and mental ?Discussed healthy diet, good sleep ?

## 2021-12-21 NOTE — Assessment & Plan Note (Signed)
Chronic Blood pressure well controlled CMP Continue HCTZ 25 mg daily, metoprolol XL 100 mg daily 

## 2021-12-21 NOTE — Assessment & Plan Note (Addendum)
Chronic ?Check B12 level ?Stop taking B12-advised that she take 1 pill 2-3 times a week and we will monitor since her count was low in the past ?

## 2021-12-21 NOTE — Assessment & Plan Note (Signed)
Chronic ?Continue atorvastatin 10 mg daily ?Check lipid panels ?She is exercising regularly and will continue ?Working on weight loss ?Encouraged healthy diet ?

## 2021-12-21 NOTE — Assessment & Plan Note (Signed)
Chronic Check a1c Low sugar / carb diet Stressed regular exercise  

## 2021-12-21 NOTE — Assessment & Plan Note (Signed)
Chronic Check lipid panel  Continue atorvastatin 10 mg daily Regular exercise and healthy diet encouraged  

## 2021-12-31 ENCOUNTER — Ambulatory Visit (INDEPENDENT_AMBULATORY_CARE_PROVIDER_SITE_OTHER): Payer: Medicare PPO | Admitting: Podiatry

## 2021-12-31 ENCOUNTER — Encounter: Payer: Self-pay | Admitting: Podiatry

## 2021-12-31 DIAGNOSIS — L600 Ingrowing nail: Secondary | ICD-10-CM

## 2021-12-31 DIAGNOSIS — B351 Tinea unguium: Secondary | ICD-10-CM

## 2021-12-31 MED ORDER — TERBINAFINE HCL 250 MG PO TABS: 250.0000 mg | ORAL_TABLET | Freq: Every day | ORAL | 0 refills | Status: DC

## 2021-12-31 NOTE — Progress Notes (Signed)
Subjective:  ? ?Patient ID: Kathleen Solis, female   DOB: 56 y.o.   MRN: [id number]  ? ?HPI ?Patient presents stating that she has bilateral chronic itchy feet and that she was treated by dermatologist with injections and wanted our opinion and has nail disease 1-5 both feet that are very thickened dystrophic and painful when pressed.  Patient does not smoke likes to be active ? ? ?Review of Systems  ?All other systems reviewed and are negative. ? ? ?   ?Objective:  ?Physical Exam ?Vitals and nursing note reviewed.  ?Constitutional:   ?   Appearance: She is well-developed.  ?Pulmonary:  ?   Effort: Pulmonary effort is normal.  ?Musculoskeletal:     ?   General: Normal range of motion.  ?Skin: ?   General: Skin is warm.  ?Neurological:  ?   Mental Status: She is alert.  ?  ?Neurovascular status intact muscle strength was found to be adequate range of motion adequate.  Patient does have significant blotches on both feet fairly large in intensity no drainage noted no pain associated with them and is found to have yellow thick painful nailbeds 1-5 both feet that she cannot take care of herself.  Good digital perfusion well oriented x3 ? ?   ?Assessment:  ?Probability for eczema of both feet that I want her dermatologist to continue to treat if she has a treatment plan with thick yellow chronic mycotic nail infections 1-5 both feet reviewed ? ?   ?Plan:  ?Condition and nail debridement today 1-5 both feet accomplished no iatrogenic bleeding and I am sending her back to dermatologist and discussed the skin condition as I can tell from this perspective ?   ? ? ?

## 2022-01-06 ENCOUNTER — Telehealth: Payer: Self-pay | Admitting: Internal Medicine

## 2022-01-06 NOTE — Telephone Encounter (Signed)
Pt is requesting a call back from assistant.   States she was rx a medication by another doctor and the side effects say : liver function & weight gain.  Pt states she just started going to the gym, and wants to know if she should continue on it.

## 2022-01-06 NOTE — Telephone Encounter (Signed)
It will not cause weight gain.  It can damage the liver which is why you will need to have your liver tests rechecked after starting it - most people do well with it, but liver damage is possible

## 2022-01-06 NOTE — Telephone Encounter (Signed)
Message left for patient to return call to clinic.  If she calls back okay to transfer her to me.

## 2022-01-07 NOTE — Telephone Encounter (Signed)
Spoke with patient today. 

## 2022-01-31 ENCOUNTER — Telehealth: Payer: Self-pay | Admitting: Emergency Medicine

## 2022-01-31 NOTE — Telephone Encounter (Signed)
Patient is requesting a refill of promethazine DM sent to [facility] on [address], [place].  Please advise, call back [phone].

## 2022-02-02 MED ORDER — PROMETHAZINE-DM 6.25-15 MG/5ML PO SYRP: 5.0000 mL | ORAL_SOLUTION | Freq: Four times a day (QID) | ORAL | 0 refills | Status: DC | PRN

## 2022-02-02 NOTE — Telephone Encounter (Signed)
Patient checking on message for cough syrup. Patient phone number is [phone].

## 2022-02-02 NOTE — Telephone Encounter (Signed)
Called and spoke with patient. She was calling to request a refill on her promethazine cough syrup. Last refill was on 11/25/21 for 437mL. [facility] on [address], please advise if you are ok with this refill. Thanks!

## 2022-02-02 NOTE — Telephone Encounter (Signed)
Called and spoke with patient. She is aware the RX has been sent.   Nothing further needed at time of call.

## 2022-02-02 NOTE — Telephone Encounter (Signed)
I am ok with this - do I need to do the refill?

## 2022-02-16 NOTE — Telephone Encounter (Signed)
Pt had a video visit with RB 3/28 after pt called the office.

## 2022-02-18 DIAGNOSIS — N393 Stress incontinence (female) (male): Secondary | ICD-10-CM | POA: Diagnosis not present

## 2022-02-18 DIAGNOSIS — N9489 Other specified conditions associated with female genital organs and menstrual cycle: Secondary | ICD-10-CM | POA: Diagnosis not present

## 2022-02-18 DIAGNOSIS — L720 Epidermal cyst: Secondary | ICD-10-CM | POA: Diagnosis not present

## 2022-04-04 ENCOUNTER — Other Ambulatory Visit: Payer: Self-pay | Admitting: Emergency Medicine

## 2022-04-04 DIAGNOSIS — J028 Acute pharyngitis due to other specified organisms: Secondary | ICD-10-CM | POA: Diagnosis not present

## 2022-04-04 DIAGNOSIS — J019 Acute sinusitis, unspecified: Secondary | ICD-10-CM | POA: Diagnosis not present

## 2022-04-04 DIAGNOSIS — Z20822 Contact with and (suspected) exposure to covid-19: Secondary | ICD-10-CM | POA: Diagnosis not present

## 2022-04-04 DIAGNOSIS — G4489 Other headache syndrome: Secondary | ICD-10-CM | POA: Diagnosis not present

## 2022-04-16 ENCOUNTER — Emergency Department (HOSPITAL_COMMUNITY)
Admission: EM | Admit: 2022-04-16 | Discharge: 2022-04-17 | Disposition: A | Discharge status: Home / Self Care | Payer: Medicare PPO | Attending: Emergency Medicine | Admitting: Emergency Medicine

## 2022-04-16 DIAGNOSIS — R0602 Shortness of breath: Secondary | ICD-10-CM | POA: Diagnosis not present

## 2022-04-16 DIAGNOSIS — J069 Acute upper respiratory infection, unspecified: Secondary | ICD-10-CM | POA: Diagnosis not present

## 2022-04-16 DIAGNOSIS — R051 Acute cough: Secondary | ICD-10-CM

## 2022-04-16 DIAGNOSIS — Z7982 Long term (current) use of aspirin: Secondary | ICD-10-CM | POA: Insufficient documentation

## 2022-04-16 DIAGNOSIS — Z20822 Contact with and (suspected) exposure to covid-19: Secondary | ICD-10-CM | POA: Insufficient documentation

## 2022-04-17 ENCOUNTER — Emergency Department (HOSPITAL_COMMUNITY): Payer: Medicare PPO

## 2022-04-17 ENCOUNTER — Encounter (HOSPITAL_COMMUNITY): Payer: Self-pay

## 2022-04-17 ENCOUNTER — Other Ambulatory Visit: Payer: Self-pay

## 2022-04-17 DIAGNOSIS — R0602 Shortness of breath: Secondary | ICD-10-CM | POA: Diagnosis not present

## 2022-04-17 LAB — CBC
HCT: 38.1 % (ref 36.0–46.0)
Hemoglobin: 13.8 g/dL (ref 12.0–15.0)
MCH: 32.4 pg (ref 26.0–34.0)
MCHC: 36.2 g/dL — ABNORMAL HIGH (ref 30.0–36.0)
MCV: 89.4 fL (ref 80.0–100.0)
Platelets: 203 10*3/uL (ref 150–400)
RBC: 4.26 MIL/uL (ref 3.87–5.11)
RDW: 13.5 % (ref 11.5–15.5)
WBC: 7.3 10*3/uL (ref 4.0–10.5)
nRBC: 0 % (ref 0.0–0.2)

## 2022-04-17 LAB — BASIC METABOLIC PANEL
Anion gap: 10 (ref 5–15)
BUN: 11 mg/dL (ref 6–20)
CO2: 28 mmol/L (ref 22–32)
Calcium: 9.4 mg/dL (ref 8.9–10.3)
Chloride: 103 mmol/L (ref 98–111)
Creatinine, Ser: 0.82 mg/dL (ref 0.44–1.00)
GFR, Estimated: >60 mL/min (ref >60)
Glucose, Bld: 90 mg/dL (ref 70–99)
Potassium: 3.5 mmol/L (ref 3.5–5.1)
Sodium: 141 mmol/L (ref 135–145)

## 2022-04-17 LAB — SARS CORONAVIRUS 2 BY RT PCR: SARS Coronavirus 2 by RT PCR: NEGATIVE

## 2022-04-17 LAB — TROPONIN I (HIGH SENSITIVITY): Troponin I (High Sensitivity): 2 ng/L (ref <18)

## 2022-04-17 MED ORDER — IPRATROPIUM BROMIDE 0.03 % NA SOLN: 2.0000 | Freq: Two times a day (BID) | NASAL | 0 refills | Status: DC

## 2022-04-17 MED ORDER — ALBUTEROL SULFATE HFA 108 (90 BASE) MCG/ACT IN AERS
2.0000 | INHALATION_SPRAY | Freq: Once | RESPIRATORY_TRACT | Status: AC
  Administered 2022-04-17: 2 via RESPIRATORY_TRACT
  Filled 2022-04-17: qty 6.7

## 2022-04-17 NOTE — ED Triage Notes (Addendum)
Ambulatory to ED with c/o shortness of breath. Recently seen for a URI, started on abx. Stated over the last few days shes been increasingly short of breath and worse when she's laying flat. " I feel like I'm drowning." Pt visibly short of breath in triage.

## 2022-04-17 NOTE — ED Provider Notes (Signed)
[facility]-EMERGENCY DEPT Provider Note   CSN: [id number] Arrival date & time: 04/16/22  2357     History  Chief Complaint  Patient presents with   Shortness of Breath    Kathleen Solis is a 56 y.o. female.  The history is provided by the patient.  URI Presenting symptoms: congestion, cough and ear pain   Presenting symptoms: no fever   Severity:  Moderate Onset quality:  Gradual Duration:  2 weeks Timing:  Constant Progression:  Unchanged Chronicity:  New Relieved by:  Nothing Worsened by:  Nothing Ineffective treatments:  None tried Associated symptoms: no wheezing   Risk factors: not elderly   Already on Augmentin for same.       Home Medications Prior to Admission medications   Medication Sig Start Date End Date Taking? Authorizing Provider  acetaminophen (TYLENOL) 500 MG tablet Take 500 mg by mouth every 6 (six) hours as needed.    [provider]  amoxicillin-clavulanate (AUGMENTIN) 875-125 MG tablet SMARTSIG:1 Tablet(s) By Mouth Every 12 Hours 04/04/22   [provider]  aspirin EC 81 MG tablet Take 1 tablet (81 mg total) by mouth in the morning. Swallow whole. 08/30/21   Leslye Peer, MD  atorvastatin (LIPITOR) 10 MG tablet TAKE ONE TABLET BY MOUTH AT BEDTIME 11/01/21   Pincus Sanes, MD  fluticasone (FLONASE) 50 MCG/ACT nasal spray Place 2 sprays into both nostrils daily in the afternoon. 11/16/21   Leslye Peer, MD  hydrochlorothiazide (HYDRODIURIL) 25 MG tablet TAKE ONE TABLET BY MOUTH DAILY 11/01/21   Pincus Sanes, MD  ipratropium (ATROVENT) 0.06 % nasal spray Place into both nostrils. 11/02/21   [provider]  loratadine (CLARITIN) 10 MG tablet Take 1 tablet (10 mg total) by mouth daily. 11/25/21   Leslye Peer, MD  metoprolol succinate (TOPROL-XL) 100 MG 24 hr tablet TAKE ONE TABLET BY MOUTH AT BEDTIME 11/01/21   Pincus Sanes, MD  promethazine-dextromethorphan (PROMETHAZINE-DM) 6.25-15 MG/5ML syrup  Take 5 mLs by mouth every 6 (six) hours as needed for cough. 02/02/22   Leslye Peer, MD  terbinafine (LAMISIL) 250 MG tablet Take 1 tablet (250 mg total) by mouth daily. 12/31/21   Lenn Sink, DPM      Allergies    Benzonatate, Darvon [propoxyphene], Ibuprofen, Morphine and related, Prednisone, Sulfa antibiotics, Tussionex pennkinetic er [hydrocod poli-chlorphe poli er], Buprenorphine hcl, Ivp dye [iodinated contrast media], and Prochlorperazine edisylate    Review of Systems   Review of Systems  Constitutional:  Negative for fever.  HENT:  Positive for congestion and ear pain.   Eyes:  Negative for redness.  Respiratory:  Positive for cough. Negative for wheezing.   Neurological:  Negative for facial asymmetry.  All other systems reviewed and are negative.   Physical Exam Updated Vital Signs BP (!) 139/109   Pulse (!) 59   Temp 98.5 F (36.9 C)   Resp (!) 28   Ht 5\' 4"  (1.626 m)   Wt 88.5 kg   SpO2 100%   BMI 33.49 kg/m  Physical Exam Vitals and nursing note reviewed. Exam conducted with a chaperone present.  Constitutional:      General: She is not in acute distress.    Appearance: Normal appearance. She is well-developed.  HENT:     Head: Normocephalic and atraumatic.     Right Ear: Tympanic membrane and ear canal normal.     Left Ear: Tympanic membrane and ear canal normal.  Nose: Nose normal.  Eyes:     Pupils: Pupils are equal, round, and reactive to light.  Cardiovascular:     Rate and Rhythm: Normal rate and regular rhythm.     Pulses: Normal pulses.     Heart sounds: Normal heart sounds.  Pulmonary:     Effort: Pulmonary effort is normal. No respiratory distress.     Breath sounds: Normal breath sounds.  Abdominal:     General: Bowel sounds are normal. There is no distension.     Palpations: Abdomen is soft.     Tenderness: There is no abdominal tenderness. There is no guarding or rebound.  Genitourinary:    Vagina: No vaginal discharge.   Musculoskeletal:        General: Normal range of motion.     Cervical back: Neck supple.  Skin:    General: Skin is dry.     Capillary Refill: Capillary refill takes less than 2 seconds.     Findings: No erythema or rash.  Neurological:     General: No focal deficit present.     Mental Status: She is alert and oriented to person, place, and time.     Deep Tendon Reflexes: Reflexes normal.  Psychiatric:        Mood and Affect: Mood normal.        Behavior: Behavior normal.     ED Results / Procedures / Treatments   Labs (all labs ordered are listed, but only abnormal results are displayed) Results for orders placed or performed during the hospital encounter of 04/16/22  SARS Coronavirus 2 by RT PCR (hospital order, performed in [facility] hospital lab) *cepheid single result test* Anterior Nasal Swab   Specimen: Anterior Nasal Swab  Result Value Ref Range   SARS Coronavirus 2 by RT PCR NEGATIVE NEGATIVE  Basic metabolic panel  Result Value Ref Range   Sodium 141 135 - 145 mmol/L   Potassium 3.5 3.5 - 5.1 mmol/L   Chloride 103 98 - 111 mmol/L   CO2 28 22 - 32 mmol/L   Glucose, Bld 90 70 - 99 mg/dL   BUN 11 6 - 20 mg/dL   Creatinine, Ser 4.09 0.44 - 1.00 mg/dL   Calcium 9.4 8.9 - 81.1 mg/dL   GFR, Estimated >91 >47 mL/min   Anion gap 10 5 - 15  CBC  Result Value Ref Range   WBC 7.3 4.0 - 10.5 K/uL   RBC 4.26 3.87 - 5.11 MIL/uL   Hemoglobin 13.8 12.0 - 15.0 g/dL   HCT 82.9 56.2 - 13.0 %   MCV 89.4 80.0 - 100.0 fL   MCH 32.4 26.0 - 34.0 pg   MCHC 36.2 (H) 30.0 - 36.0 g/dL   RDW 86.5 78.4 - 69.6 %   Platelets 203 150 - 400 K/uL   nRBC 0.0 0.0 - 0.2 %  Troponin I (High Sensitivity)  Result Value Ref Range   Troponin I (High Sensitivity) 2 <18 ng/L   DG Chest 2 View  Result Date: 04/17/2022 CLINICAL DATA:  Shortness of breath EXAM: CHEST - 2 VIEW COMPARISON:  11/08/2021 FINDINGS: The heart size and mediastinal contours are within normal limits. Both lungs are clear.  The visualized skeletal structures are unremarkable. IMPRESSION: No active cardiopulmonary disease. Electronically Signed   By: Alcide Clever M.D.   On: 04/17/2022 00:49       Radiology DG Chest 2 View  Result Date: 04/17/2022 CLINICAL DATA:  Shortness of breath EXAM: CHEST - 2 VIEW  COMPARISON:  11/08/2021 FINDINGS: The heart size and mediastinal contours are within normal limits. Both lungs are clear. The visualized skeletal structures are unremarkable. IMPRESSION: No active cardiopulmonary disease. Electronically Signed   By: Alcide Clever M.D.   On: 04/17/2022 00:49    Procedures Procedures    Medications Ordered in ED Medications  albuterol (VENTOLIN HFA) 108 (90 Base) MCG/ACT inhaler 2 puff (has no administration in time range)    ED Course/ Medical Decision Making/ A&P                           Medical Decision Making Patient with URI x 2 weeks, on augmentin for symptoms   Problems Addressed: Acute cough:    Details: Patient is not coughing in the room.  Will start albuterol as patient is allergic to steroids and tessalon.  Follow up with your PMD for ongoing care.    Amount and/or Complexity of Data Reviewed External Data Reviewed: notes.    Details: Previous notes reviewed  Labs: ordered.    Details: All labs reviewed:  negative covid swab, troponin is negative at 2, normal white count, 7.3.  Normal hemoglobin.  Normal sodium and potassium and creatinine .31 Radiology: ordered and independent interpretation performed.    Details: Normal chest Xray  ECG/medicine tests: ordered and independent interpretation performed. Decision-making details documented in ED Course.  Risk Prescription drug management. Risk Details: Patient is not coughing in the room.  Patient states she hears a noise when she coughs.  Lungs are clear.  I do not want to suppress cough if there was a concern for bacterial infection by previous MD who started antibiotics.      EKG  Interpretation  Date/Time:  Sunday April 17 2022 02:36:04 EDT Ventricular Rate:  55 PR Interval:  190 QRS Duration: 90 QT Interval:  440 QTC Calculation: 421 R Axis:   50 Text Interpretation: Sinus rhythm Abnormal R-wave progression, early transition Confirmed by Nicanor Alcon, Matisse Roskelley ([id number]) on 04/17/2022 2:48:15 AM         Final Clinical Impression(s) / ED Diagnoses Final diagnoses:  None   Return for intractable cough, coughing up blood, fevers > 100.4 unrelieved by medication, shortness of breath, intractable vomiting, chest pain, shortness of breath, weakness, numbness, changes in speech, facial asymmetry, abdominal pain, passing out, Inability to tolerate liquids or food, cough, altered mental status or any concerns. No signs of systemic illness or infection. The patient is nontoxic-appearing on exam and vital signs are within normal limits.  I have reviewed the triage vital signs and the nursing notes. Pertinent labs & imaging results that were available during my care of the patient were reviewed by me and considered in my medical decision making (see chart for details). After history, exam, and medical workup I feel the patient has been appropriately medically screened and is safe for discharge home. Pertinent diagnoses were discussed with the patient. Patient was given return precautions.  Rx / DC Orders ED Discharge Orders     None         Gunner Iodice, MD 04/17/22 [id number]

## 2022-04-19 DIAGNOSIS — J209 Acute bronchitis, unspecified: Secondary | ICD-10-CM | POA: Diagnosis not present

## 2022-04-19 DIAGNOSIS — R059 Cough, unspecified: Secondary | ICD-10-CM | POA: Diagnosis not present

## 2022-04-19 DIAGNOSIS — R6883 Chills (without fever): Secondary | ICD-10-CM | POA: Diagnosis not present

## 2022-04-19 DIAGNOSIS — Z03818 Encounter for observation for suspected exposure to other biological agents ruled out: Secondary | ICD-10-CM | POA: Diagnosis not present

## 2022-04-19 DIAGNOSIS — R112 Nausea with vomiting, unspecified: Secondary | ICD-10-CM | POA: Diagnosis not present

## 2022-04-19 DIAGNOSIS — R519 Headache, unspecified: Secondary | ICD-10-CM | POA: Diagnosis not present

## 2022-04-19 DIAGNOSIS — R52 Pain, unspecified: Secondary | ICD-10-CM | POA: Diagnosis not present

## 2022-04-19 DIAGNOSIS — Z8709 Personal history of other diseases of the respiratory system: Secondary | ICD-10-CM | POA: Diagnosis not present

## 2022-04-27 DIAGNOSIS — R051 Acute cough: Secondary | ICD-10-CM | POA: Diagnosis not present

## 2022-04-27 DIAGNOSIS — R0789 Other chest pain: Secondary | ICD-10-CM | POA: Diagnosis not present

## 2022-04-27 DIAGNOSIS — J42 Unspecified chronic bronchitis: Secondary | ICD-10-CM | POA: Diagnosis not present

## 2022-05-04 ENCOUNTER — Other Ambulatory Visit: Payer: Self-pay | Admitting: Internal Medicine

## 2022-05-04 DIAGNOSIS — E782 Mixed hyperlipidemia: Secondary | ICD-10-CM

## 2022-05-12 ENCOUNTER — Telehealth: Payer: Self-pay

## 2022-05-12 ENCOUNTER — Ambulatory Visit (INDEPENDENT_AMBULATORY_CARE_PROVIDER_SITE_OTHER): Payer: Medicare Other

## 2022-05-12 VITALS — BP 118/80 | HR 67 | Temp 97.6°F | Resp 16 | Ht 64.0 in | Wt 188.2 lb

## 2022-05-12 DIAGNOSIS — Z0000 Encounter for general adult medical examination without abnormal findings: Secondary | ICD-10-CM | POA: Diagnosis not present

## 2022-05-12 MED ORDER — FLUCONAZOLE 150 MG PO TABS: 150.0000 mg | ORAL_TABLET | ORAL | 0 refills | Status: DC

## 2022-05-12 NOTE — Telephone Encounter (Signed)
I have Kathleen Solis in my office she has been on 2 abx and she now has a severe yeast infection. She has been on Doxycycline Hyclate 100 mg Amox/clav 875-125 mg. Her rash is o the side of the leg in the groin area and in the pubic hair.  Not sexual active.  Duration 1 week.  Itching, bumpy, red.  No oozing.  [facility] [phone]

## 2022-05-12 NOTE — Telephone Encounter (Signed)
Medication sent to pharmacy.  Message sent to patient via mychart informing her of that.

## 2022-05-12 NOTE — Progress Notes (Signed)
Subjective:   Kathleen Solis is a 56 y.o. female who presents for Medicare Annual (Subsequent) preventive examination.  Review of Systems     Cardiac Risk Factors include: advanced age (>34men, >47 women);hypertension;family history of premature cardiovascular disease;obesity (BMI >30kg/m2)     Objective:    Today's Vitals   05/12/22 0935  BP: 118/80  Pulse: 67  Resp: 16  Temp: 97.6 F (36.4 C)  TempSrc: Temporal  SpO2: 97%  Weight: 188 lb 3.2 oz (85.4 kg)  Height: 5\' 4"  (1.626 m)  PainSc: 0-No pain   Body mass index is 32.3 kg/m.     05/12/2022   10:15 AM 04/17/2022   12:20 AM 12/15/2021    9:34 AM 08/30/2021   11:36 AM 08/12/2021    8:13 PM 05/08/2021   12:57 AM 05/06/2021    9:30 AM  Advanced Directives  Does Patient Have a Medical Advance Directive? No No No No No No No  Would patient like information on creating a medical advance directive? No - Patient declined   No - Patient declined No - Patient declined  No - Patient declined    Current Medications (verified) Outpatient Encounter Medications as of 05/12/2022  Medication Sig   acetaminophen (TYLENOL) 500 MG tablet Take 500 mg by mouth every 6 (six) hours as needed.   aspirin EC 81 MG tablet Take 1 tablet (81 mg total) by mouth in the morning. Swallow whole.   atorvastatin (LIPITOR) 10 MG tablet TAKE ONE TABLET BY MOUTH AT BEDTIME   hydrochlorothiazide (HYDRODIURIL) 25 MG tablet TAKE ONE TABLET BY MOUTH DAILY (Patient taking differently: Take 25 mg by mouth daily.)   loratadine (CLARITIN) 10 MG tablet Take 1 tablet (10 mg total) by mouth daily.   metoprolol succinate (TOPROL-XL) 100 MG 24 hr tablet TAKE ONE TABLET BY MOUTH AT BEDTIME   amoxicillin-clavulanate (AUGMENTIN) 875-125 MG tablet Take 1 tablet by mouth 2 (two) times daily. (Patient not taking: Reported on 05/12/2022)   fluticasone (FLONASE) 50 MCG/ACT nasal spray Place 2 sprays into both nostrils daily in the afternoon. (Patient not taking: Reported on  04/17/2022)   ipratropium (ATROVENT) 0.03 % nasal spray Place 2 sprays into both nostrils every 12 (twelve) hours. (Patient not taking: Reported on 05/12/2022)   promethazine-dextromethorphan (PROMETHAZINE-DM) 6.25-15 MG/5ML syrup Take 5 mLs by mouth every 6 (six) hours as needed for cough. (Patient not taking: Reported on 04/17/2022)   terbinafine (LAMISIL) 250 MG tablet Take 1 tablet (250 mg total) by mouth daily. (Patient not taking: Reported on 04/17/2022)   No facility-administered encounter medications on file as of 05/12/2022.    Allergies (verified) Benzonatate, Darvon [propoxyphene], Ibuprofen, Morphine and related, Prednisone, Sulfa antibiotics, Tussionex pennkinetic er [hydrocod poli-chlorphe poli er], Buprenorphine hcl, Ivp dye [iodinated contrast media], and Prochlorperazine edisylate   History: Past Medical History:  Diagnosis Date   Allergic rhinitis 08/16/2016   Benign essential hypertension 03/03/2015   Bone spur of right foot 02/08/2016   Bunion of left foot 02/08/2016   Carotid arterial disease 04/10/2016   03/2016: right 40-59%, left < 40%; 10/2020 carotid US: No evidence of B ICA stenosis   Chest pain 02/14/2017   Cyst of left ovary 03/03/2015   Diverticulitis of colon without hemorrhage 02/25/2016   Epigastric pain 05/06/2020   Episodic tension-type headache 01/12/2016   Taking motrin 200 mg bid   GERD (gastroesophageal reflux disease)    History of cerebrovascular accident (CVA) with residual deficit 2001   Continue aspirin 81 mg daily, statin  and current blood pressure medications; Blood pressure has been well controlled and cholesterol has been at goal   Hyperglycemia 02/14/2017   Chronic; encouraged low sugar/carbohydrate diet and regular exercise   Hyperlipidemia    diet controlled - no med   Leg neuralgia, right    Related to CVA    Lumbar back pain 05/10/2018   Lumbar radiculopathy 05/15/2018   Pain seems consistent with radiculopathy   MCI (mild cognitive  impairment) with memory loss 12/31/2020   Midline cystocele    Mild asthma 08/27/2020   no inhaler   Onychomycosis 02/21/2018   Toenails; Ciclopirox prescribed   Sciatica    right   Sinus infection 03/22/2019   Sleep apnea    recently dx, appt on 09/15/21 with MD to discuss results/CPAP   Stroke [facility])    Mini strokes per patient   SVD (spontaneous vaginal delivery)    x 2   Tinnitus of right ear 11/16/2016   no current problem as of 08/26/21 per patient   Vitamin B12 deficiency 02/23/2018   Past Surgical History:  Procedure Laterality Date   ABDOMINAL HYSTERECTOMY     partial   ANAL FISSURE REPAIR     APPENDECTOMY     BRONCHIAL WASHINGS  08/30/2021   Procedure: BRONCHIAL WASHINGS;  Surgeon: Leslye Peer, MD;  Location: [facility] ENDOSCOPY;  Service: Cardiopulmonary;;   CHOLECYSTECTOMY     CYSTOSCOPY N/A 10/04/2017   Procedure: Bluford Kaufmann;  Surgeon: Gerald Leitz, MD;  Location: [facility] ORS;  Service: Gynecology;  Laterality: N/A;   FOOT SURGERY Left    big toe bunion removed   LAPAROSCOPIC BILATERAL SALPINGO OOPHERECTOMY Bilateral 10/04/2017   Procedure: LAPAROSCOPIC BILATERAL SALPINGO OOPHORECTOMY, PELVIC WASHINGS;  Surgeon: Gerald Leitz, MD;  Location: [facility] ORS;  Service: Gynecology;  Laterality: Bilateral;   SACRAL COLPOPEXY ROBOTIC, posterior repair, cystoscopy  07/04/2019   @ [facility]   TUBAL LIGATION     VIDEO BRONCHOSCOPY N/A 08/30/2021   Procedure: VIDEO BRONCHOSCOPY WITHOUT FLUORO;  Surgeon: Leslye Peer, MD;  Location: [facility] ENDOSCOPY;  Service: Cardiopulmonary;  Laterality: N/A;   WISDOM TOOTH EXTRACTION     Family History  Problem Relation Age of Onset   Breast cancer Sister    Diabetes Sister    Glaucoma Sister    Cataracts Sister    Hypertension Sister    Anuerysm Mother 31   Pancreatic cancer Father    Retinal detachment Brother    Cataracts Sister    Breast cancer Cousin    Anuerysm Cousin    Breast cancer Niece    Esophageal cancer Nephew    Developmental delay Nephew     Cerebral palsy Son        mild   Colon polyps Neg Hx    Prostate cancer Neg Hx    Rectal cancer Neg Hx    Stomach cancer Neg Hx    Uterine cancer Neg Hx    Social History   Socioeconomic History   Marital status: Single    Spouse name: Not on file   Number of children: 2   Years of education: 14   Highest education level: Associate degree: academic program  Occupational History   Occupation: Disability  Tobacco Use   Smoking status: Never   Smokeless tobacco: Never  Vaping Use   Vaping Use: Never used  Substance and Sexual Activity   Alcohol use: No    Alcohol/week: 0.0 standard drinks of alcohol   Drug use: No   Sexual activity: Not on  file    Comment: Hysterectomy  Other Topics Concern   Not on file  Social History Narrative   No regular exercise      Pt is right handed, she occasionally drinks tea, walks QOD. She lives with her 44 yr old son, he has mild cerebral palsy.   Social Determinants of Health   Financial Resource Strain: Low Risk  (05/12/2022)   Overall Financial Resource Strain (CARDIA)    Difficulty of Paying Living Expenses: Not hard at all  Food Insecurity: No Food Insecurity (05/12/2022)   Hunger Vital Sign    Worried About Running Out of Food in the Last Year: Never true    Ran Out of Food in the Last Year: Never true  Transportation Needs: No Transportation Needs (05/12/2022)   PRAPARE - Administrator, Civil Service (Medical): No    Lack of Transportation (Non-Medical): No  Physical Activity: Sufficiently Active (05/12/2022)   Exercise Vital Sign    Days of Exercise per Week: 5 days    Minutes of Exercise per Session: 30 min  Recent Concern: Physical Activity - Inactive (05/12/2022)   Exercise Vital Sign    Days of Exercise per Week: 0 days    Minutes of Exercise per Session: 0 min  Stress: No Stress Concern Present (05/12/2022)   [employer] of Occupational Health - Occupational Stress Questionnaire    Feeling of Stress :  Not at all  Social Connections: Moderately Integrated (05/12/2022)   Social Connection and Isolation Panel [NHANES]    Frequency of Communication with Friends and Family: More than three times a week    Frequency of Social Gatherings with Friends and Family: More than three times a week    Attends Religious Services: More than 4 times per year    Active Member of [employer] or Organizations: Yes    Attends Engineer, structural: More than 4 times per year    Marital Status: Never married    Tobacco Counseling Counseling given: Not Answered   Clinical Intake:  Pre-visit preparation completed: Yes  Pain : No/denies pain Pain Score: 0-No pain     BMI - recorded: 32.3 Nutritional Status: BMI > 30  Obese Nutritional Risks: None Diabetes: No  How often do you need to have someone help you when you read instructions, pamphlets, or other written materials from your doctor or pharmacy?: 1 - Never What is the last grade level you completed in school?: HSG; ASSOCIATE'S DEGREE  Diabetic? NO  Interpreter Needed?: No  Information entered by :: Susie Cassette, LPN.   Activities of Daily Living    05/12/2022    9:43 AM  In your present state of health, do you have any difficulty performing the following activities:  Hearing? 0  Vision? 0  Difficulty concentrating or making decisions? 0  Walking or climbing stairs? 0  Dressing or bathing? 0  Doing errands, shopping? 0  Preparing Food and eating ? N  Using the Toilet? N  In the past six months, have you accidently leaked urine? N  Do you have problems with loss of bowel control? N  Managing your Medications? N  Managing your Finances? N  Housekeeping or managing your Housekeeping? N    Patient Care Team: Pincus Sanes, MD as PCP - General (Internal Medicine) Parke Poisson, MD as PCP - Cardiology (Cardiology) Drema Dallas, DO as Consulting Physician (Neurology) Hurshel Party, OD as Consulting Physician  (Optometry)  Indicate any recent Medical Services  you may have received from other than [facility] providers in the past year (date may be approximate).     Assessment:   This is a routine wellness examination for Osiris.  Hearing/Vision screen Hearing Screening - Comments:: Denies hearing difficulties   Vision Screening - Comments:: Wears rx glasses - up to date with routine eye exams with Cephus Richer, OD.   Dietary issues and exercise activities discussed: Current Exercise Habits: Home exercise routine, Type of exercise: walking;treadmill;stretching;strength training/weights;exercise ball;calisthenics (PATIENT JOINED GYM), Time (Minutes): 30, Frequency (Times/Week): 5, Weekly Exercise (Minutes/Week): 150, Intensity: Moderate, Exercise limited by: respiratory conditions(s)   Goals Addressed             This Visit's Progress    Continue to lose weight and eat healthy.        Depression Screen    05/12/2022    9:36 AM 12/21/2021    9:39 AM 05/06/2021    9:50 AM 11/03/2020   10:15 AM 06/07/2017    9:58 AM  PHQ 2/9 Scores  PHQ - 2 Score 0 0 0 0 0  PHQ- 9 Score    4     Fall Risk    05/12/2022    9:43 AM 12/21/2021    9:38 AM 12/15/2021    9:34 AM 05/06/2021    9:36 AM 11/06/2020    8:54 AM  Fall Risk   Falls in the past year? 0 0 0 0 0  Number falls in past yr: 0 0 0 0 0  Injury with Fall? 0 0 0 0 0  Risk for fall due to : No Fall Risks No Fall Risks  No Fall Risks   Follow up Falls prevention discussed Falls evaluation completed  Falls evaluation completed     FALL RISK PREVENTION PERTAINING TO THE HOME:  Any stairs in or around the home? No  If so, are there any without handrails? No  Home free of loose throw rugs in walkways, pet beds, electrical cords, etc? Yes  Adequate lighting in your home to reduce risk of falls? Yes   ASSISTIVE DEVICES UTILIZED TO PREVENT FALLS:  Life alert? No  Use of a cane, walker or w/c? No  Grab bars in the bathroom? No  Shower chair or  bench in shower? No  Elevated toilet seat or a handicapped toilet? No   TIMED UP AND GO:  Was the test performed? Yes .  Length of time to ambulate 10 feet: 6 sec.   Gait steady and fast without use of assistive device  Cognitive Function:      12/26/2017    1:00 PM  Montreal Cognitive Assessment   Visuospatial/ Executive (0/5) 3  Naming (0/3) 3  Attention: Read list of digits (0/2) 0  Attention: Read list of letters (0/1) 1  Attention: Serial 7 subtraction starting at 100 (0/3) 1  Language: Repeat phrase (0/2) 0  Language : Fluency (0/1) 0  Abstraction (0/2) 1  Delayed Recall (0/5) 0  Orientation (0/6) 5  Total 14  Adjusted Score (based on education) 14      05/12/2022    9:43 AM  6CIT Screen  What Year? 0 points  What month? 0 points  What time? 0 points  Count back from 20 0 points  Months in reverse 0 points  Repeat phrase 0 points  Total Score 0 points    Immunizations Immunization History  Administered Date(s) Administered   PFIZER(Purple Top)SARS-COV-2 Vaccination 04/11/2020, 06/16/2020    TDAP status: Due,  Education has been provided regarding the importance of this vaccine. Advised may receive this vaccine at local pharmacy or Health Dept. Aware to provide a copy of the vaccination record if obtained from local pharmacy or Health Dept. Verbalized acceptance and understanding.  Flu Vaccine status: Declined, Education has been provided regarding the importance of this vaccine but patient still declined. Advised may receive this vaccine at local pharmacy or Health Dept. Aware to provide a copy of the vaccination record if obtained from local pharmacy or Health Dept. Verbalized acceptance and understanding.  Pneumococcal vaccine status: Declined,  Education has been provided regarding the importance of this vaccine but patient still declined. Advised may receive this vaccine at local pharmacy or Health Dept. Aware to provide a copy of the vaccination record if  obtained from local pharmacy or Health Dept. Verbalized acceptance and understanding.   Covid-19 vaccine status: Completed vaccines  Qualifies for Shingles Vaccine? Yes   Zostavax completed No   Shingrix Completed?: No.    Education has been provided regarding the importance of this vaccine. Patient has been advised to call insurance company to determine out of pocket expense if they have not yet received this vaccine. Advised may also receive vaccine at local pharmacy or Health Dept. Verbalized acceptance and understanding.  Screening Tests Health Maintenance  Topic Date Due   Zoster Vaccines- Shingrix (1 of 2) Never done   COVID-19 Vaccine (3 - Pfizer series) 08/11/2020   INFLUENZA VACCINE  Never done   TETANUS/TDAP  12/22/2022 (Originally 12/13/1984)   MAMMOGRAM  08/18/2022   COLONOSCOPY (Pts [id number] Insurance coverage will need to be confirmed)  04/06/2025   Hepatitis C Screening  Completed   HIV Screening  Completed   HPV VACCINES  Aged Out    Health Maintenance  Health Maintenance Due  Topic Date Due   Zoster Vaccines- Shingrix (1 of 2) Never done   COVID-19 Vaccine (3 - Pfizer series) 08/11/2020   INFLUENZA VACCINE  Never done    Colorectal cancer screening: Type of screening: Colonoscopy. Completed 04/07/2015. Repeat every 10 years  Mammogram status: Completed 08/18/2021. Repeat every year  Bone Density status: never done  Lung Cancer Screening: (Low Dose CT Chest recommended if Age 79-80 years, 30 pack-year currently smoking OR have quit w/in 15years.) does not qualify.   Lung Cancer Screening Referral: no  Additional Screening:  Hepatitis C Screening: does qualify; Completed 11/03/2020  Vision Screening: Recommended annual ophthalmology exams for early detection of glaucoma and other disorders of the eye. Is the patient up to date with their annual eye exam?  Yes  Who is the provider or what is the name of the office in which the patient attends annual eye  exams? Cephus RicherJoseph Perez, OD. If pt is not established with a provider, would they like to be referred to a provider to establish care? No .   Dental Screening: Recommended annual dental exams for proper oral hygiene  Community Resource Referral / Chronic Care Management: CRR required this visit?  No   CCM required this visit?  No      Plan:     I have personally reviewed and noted the following in the patient's chart:   Medical and social history Use of alcohol, tobacco or illicit drugs  Current medications and supplements including opioid prescriptions. Patient is not currently taking opioid prescriptions. Functional ability and status Nutritional status Physical activity Advanced directives List of other physicians Hospitalizations, surgeries, and ER visits in previous 12 months Vitals Screenings to  include cognitive, depression, and falls Referrals and appointments  In addition, I have reviewed and discussed with patient certain preventive protocols, quality metrics, and best practice recommendations. A written personalized care plan for preventive services as well as general preventive health recommendations were provided to patient.     Mickeal Needy, LPN   09/09/1476   Nurse Notes: N/A

## 2022-05-12 NOTE — Addendum Note (Signed)
Addended by: Binnie Rail on: 05/12/2022 06:01 PM   Modules accepted: Orders

## 2022-05-12 NOTE — Patient Instructions (Signed)
Kathleen Solis , Thank you for taking time to come for your Medicare Wellness Visit. I appreciate your ongoing commitment to your health goals. Please review the following plan we discussed and let me know if I can assist you in the future.   These are the goals we discussed:  Goals       Continue to lose weight and eat healthy.      Patient Stated (pt-stated)      My goal is to lose 20 pounds.        This is a list of the screening recommended for you and due dates:  Health Maintenance  Topic Date Due   Zoster (Shingles) Vaccine (1 of 2) Never done   COVID-19 Vaccine (3 - Pfizer series) 08/11/2020   Flu Shot  Never done   Tetanus Vaccine  12/22/2022*   Mammogram  08/18/2022   Colon Cancer Screening  04/06/2025   Hepatitis C Screening: USPSTF Recommendation to screen - Ages 18-79 yo.  Completed   HIV Screening  Completed   HPV Vaccine  Aged Out  *Topic was postponed. The date shown is not the original due date.    Advanced directives: No; Advance directive discussed with you today. Even though you declined this today please call our office should you change your mind and we can give you the proper paperwork for you to fill out.  Conditions/risks identified: Yes  Next appointment: Follow up in one year for your annual wellness visit.   Preventive Care 40-64 Years, Female Preventive care refers to lifestyle choices and visits with your health care provider that can promote health and wellness. What does preventive care include? A yearly physical exam. This is also called an annual well check. Dental exams once or twice a year. Routine eye exams. Ask your health care provider how often you should have your eyes checked. Personal lifestyle choices, including: Daily care of your teeth and gums. Regular physical activity. Eating a healthy diet. Avoiding tobacco and drug use. Limiting alcohol use. Practicing safe sex. Taking low-dose aspirin daily starting at age 51. Taking  vitamin and mineral supplements as recommended by your health care provider. What happens during an annual well check? The services and screenings done by your health care provider during your annual well check will depend on your age, overall health, lifestyle risk factors, and family history of disease. Counseling  Your health care provider may ask you questions about your: Alcohol use. Tobacco use. Drug use. Emotional well-being. Home and relationship well-being. Sexual activity. Eating habits. Work and work Statistician. Method of birth control. Menstrual cycle. Pregnancy history. Screening  You may have the following tests or measurements: Height, weight, and BMI. Blood pressure. Lipid and cholesterol levels. These may be checked every 5 years, or more frequently if you are over 41 years old. Skin check. Lung cancer screening. You may have this screening every year starting at age 64 if you have a 30-pack-year history of smoking and currently smoke or have quit within the past 15 years. Fecal occult blood test (FOBT) of the stool. You may have this test every year starting at age 71. Flexible sigmoidoscopy or colonoscopy. You may have a sigmoidoscopy every 5 years or a colonoscopy every 10 years starting at age 46. Hepatitis C blood test. Hepatitis B blood test. Sexually transmitted disease (STD) testing. Diabetes screening. This is done by checking your blood sugar (glucose) after you have not eaten for a while (fasting). You may have this done every  1-3 years. Mammogram. This may be done every 1-2 years. Talk to your health care provider about when you should start having regular mammograms. This may depend on whether you have a family history of breast cancer. BRCA-related cancer screening. This may be done if you have a family history of breast, ovarian, tubal, or peritoneal cancers. Pelvic exam and Pap test. This may be done every 3 years starting at age 70. Starting at age  47, this may be done every 5 years if you have a Pap test in combination with an HPV test. Bone density scan. This is done to screen for osteoporosis. You may have this scan if you are at high risk for osteoporosis. Discuss your test results, treatment options, and if necessary, the need for more tests with your health care provider. Vaccines  Your health care provider may recommend certain vaccines, such as: Influenza vaccine. This is recommended every year. Tetanus, diphtheria, and acellular pertussis (Tdap, Td) vaccine. You may need a Td booster every 10 years. Zoster vaccine. You may need this after age 55. Pneumococcal 13-valent conjugate (PCV13) vaccine. You may need this if you have certain conditions and were not previously vaccinated. Pneumococcal polysaccharide (PPSV23) vaccine. You may need one or two doses if you smoke cigarettes or if you have certain conditions. Talk to your health care provider about which screenings and vaccines you need and how often you need them. This information is not intended to replace advice given to you by your health care provider. Make sure you discuss any questions you have with your health care provider. Document Released: 09/04/2015 Document Revised: 04/27/2016 Document Reviewed: 06/09/2015 Elsevier Interactive Patient Education  2017 [place] Prevention in the Home Falls can cause injuries. They can happen to people of all ages. There are many things you can do to make your home safe and to help prevent falls. What can I do on the outside of my home? Regularly fix the edges of walkways and driveways and fix any cracks. Remove anything that might make you trip as you walk through a door, such as a raised step or threshold. Trim any bushes or trees on the path to your home. Use bright outdoor lighting. Clear any walking paths of anything that might make someone trip, such as rocks or tools. Regularly check to see if handrails are  loose or broken. Make sure that both sides of any steps have handrails. Any raised decks and porches should have guardrails on the edges. Have any leaves, snow, or ice cleared regularly. Use sand or salt on walking paths during winter. Clean up any spills in your garage right away. This includes oil or grease spills. What can I do in the bathroom? Use night lights. Install grab bars by the toilet and in the tub and shower. Do not use towel bars as grab bars. Use non-skid mats or decals in the tub or shower. If you need to sit down in the shower, use a plastic, non-slip stool. Keep the floor dry. Clean up any water that spills on the floor as soon as it happens. Remove soap buildup in the tub or shower regularly. Attach bath mats securely with double-sided non-slip rug tape. Do not have throw rugs and other things on the floor that can make you trip. What can I do in the bedroom? Use night lights. Make sure that you have a light by your bed that is easy to reach. Do not use any sheets or  blankets that are too big for your bed. They should not hang down onto the floor. Have a firm chair that has side arms. You can use this for support while you get dressed. Do not have throw rugs and other things on the floor that can make you trip. What can I do in the kitchen? Clean up any spills right away. Avoid walking on wet floors. Keep items that you use a lot in easy-to-reach places. If you need to reach something above you, use a strong step stool that has a grab bar. Keep electrical cords out of the way. Do not use floor polish or wax that makes floors slippery. If you must use wax, use non-skid floor wax. Do not have throw rugs and other things on the floor that can make you trip. What can I do with my stairs? Do not leave any items on the stairs. Make sure that there are handrails on both sides of the stairs and use them. Fix handrails that are broken or loose. Make sure that handrails are as  long as the stairways. Check any carpeting to make sure that it is firmly attached to the stairs. Fix any carpet that is loose or worn. Avoid having throw rugs at the top or bottom of the stairs. If you do have throw rugs, attach them to the floor with carpet tape. Make sure that you have a light switch at the top of the stairs and the bottom of the stairs. If you do not have them, ask someone to add them for you. What else can I do to help prevent falls? Wear shoes that: Do not have high heels. Have rubber bottoms. Are comfortable and fit you well. Are closed at the toe. Do not wear sandals. If you use a stepladder: Make sure that it is fully opened. Do not climb a closed stepladder. Make sure that both sides of the stepladder are locked into place. Ask someone to hold it for you, if possible. Clearly mark and make sure that you can see: Any grab bars or handrails. First and last steps. Where the edge of each step is. Use tools that help you move around (mobility aids) if they are needed. These include: Canes. Walkers. Scooters. Crutches. Turn on the lights when you go into a dark area. Replace any light bulbs as soon as they burn out. Set up your furniture so you have a clear path. Avoid moving your furniture around. If any of your floors are uneven, fix them. If there are any pets around you, be aware of where they are. Review your medicines with your doctor. Some medicines can make you feel dizzy. This can increase your chance of falling. Ask your doctor what other things that you can do to help prevent falls. This information is not intended to replace advice given to you by your health care provider. Make sure you discuss any questions you have with your health care provider. Document Released: 06/04/2009 Document Revised: 01/14/2016 Document Reviewed: 09/12/2014 Elsevier Interactive Patient Education  2017 [employer]  

## 2022-05-17 ENCOUNTER — Ambulatory Visit (INDEPENDENT_AMBULATORY_CARE_PROVIDER_SITE_OTHER): Payer: Medicare Other | Admitting: Family Medicine

## 2022-05-17 ENCOUNTER — Ambulatory Visit (INDEPENDENT_AMBULATORY_CARE_PROVIDER_SITE_OTHER): Payer: Medicare Other

## 2022-05-17 ENCOUNTER — Encounter: Payer: Self-pay | Admitting: Family Medicine

## 2022-05-17 VITALS — BP 110/80 | HR 65 | Temp 98.5°F | Wt 186.0 lb

## 2022-05-17 DIAGNOSIS — M25572 Pain in left ankle and joints of left foot: Secondary | ICD-10-CM

## 2022-05-17 MED ORDER — COLCHICINE 0.6 MG PO TABS: 0.6000 mg | ORAL_TABLET | Freq: Four times a day (QID) | ORAL | 0 refills | Status: DC | PRN

## 2022-05-17 NOTE — Progress Notes (Signed)
   Subjective:    Patient ID: Kathleen Solis, female    DOB: [birth year], 56 y.o.   MRN: [id number]  HPI Here for the sudden onset of swelling and severe pain in the left ankle yesterday. No recent trauma. She has never had anything like this before. The pain involves both sides of the ankle. She has taken Tylenol and applied ice and Icy Hot with no relief. Of note, her younger brother has gout.    Review of Systems  Constitutional: Negative.   Respiratory: Negative.    Cardiovascular: Negative.   Musculoskeletal:  Positive for arthralgias.       Objective:   Physical Exam Constitutional:      Comments: Walks with a limp   Cardiovascular:     Rate and Rhythm: Normal rate and regular rhythm.     Pulses: Normal pulses.     Heart sounds: Normal heart sounds.  Pulmonary:     Effort: Pulmonary effort is normal.     Breath sounds: Normal breath sounds.  Musculoskeletal:     Comments: The left ankle is swollen around both the medial and lateral sides. These areas are very tender and ROM of the ankle is limited by pain. These areas are also warm but not red   Neurological:     Mental Status: She is alert.           Assessment & Plan:  This is likely a bout of gout. We will get Xrays of the left ankle. She will try Colchicine 0.6 mg every 6 hours as needed, and she can combine Tylenol with this. She will follow up with Dr. Quay Burow as needed. Alysia Penna, MD

## 2022-05-19 ENCOUNTER — Telehealth: Payer: Self-pay | Admitting: Emergency Medicine

## 2022-05-19 ENCOUNTER — Telehealth: Payer: Self-pay

## 2022-05-19 MED ORDER — PROMETHAZINE-DM 6.25-15 MG/5ML PO SYRP: 5.0000 mL | ORAL_SOLUTION | Freq: Four times a day (QID) | ORAL | 0 refills | Status: DC | PRN

## 2022-05-19 NOTE — Telephone Encounter (Signed)
You can schedule TB test on nurse schedule.Marland KitchenMarland Kitchen

## 2022-05-19 NOTE — Telephone Encounter (Signed)
I sent the script.

## 2022-05-19 NOTE — Telephone Encounter (Signed)
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

## 2022-05-19 NOTE — Telephone Encounter (Signed)
May we send in refill for pt of PROMETHAZINE-DM. She is scheduled for OV on 06/01/22. Dr. Lamonte Sakai please advise.

## 2022-05-19 NOTE — Telephone Encounter (Signed)
Notified pt. Nothing further needed at this time.

## 2022-05-20 NOTE — Telephone Encounter (Signed)
Patient is scheduled for Monday.

## 2022-05-23 ENCOUNTER — Telehealth: Payer: Self-pay

## 2022-05-23 ENCOUNTER — Ambulatory Visit (INDEPENDENT_AMBULATORY_CARE_PROVIDER_SITE_OTHER): Payer: Medicare Other

## 2022-05-23 DIAGNOSIS — Z111 Encounter for screening for respiratory tuberculosis: Secondary | ICD-10-CM | POA: Diagnosis not present

## 2022-05-23 DIAGNOSIS — Z23 Encounter for immunization: Secondary | ICD-10-CM

## 2022-05-23 NOTE — Telephone Encounter (Signed)
Received a health examination form from patient, placed in mailbox upfront.

## 2022-05-23 NOTE — Progress Notes (Signed)
Please advise cosign for Pt

## 2022-05-23 NOTE — Telephone Encounter (Signed)
Form placed in folder to be signed

## 2022-05-25 ENCOUNTER — Ambulatory Visit: Payer: Medicare Other

## 2022-05-25 LAB — TB SKIN TEST
Induration: 0 mm
TB Skin Test: NEGATIVE

## 2022-05-25 NOTE — Progress Notes (Signed)
Tuberculin skin test applied to left ventral forearm. Test was read, with negative result and 0 mm induration.

## 2022-05-25 NOTE — Telephone Encounter (Signed)
Patient picked up form tomorrow.

## 2022-06-01 ENCOUNTER — Encounter: Payer: Self-pay | Admitting: Emergency Medicine

## 2022-06-01 ENCOUNTER — Other Ambulatory Visit: Payer: Self-pay | Admitting: Emergency Medicine

## 2022-06-01 ENCOUNTER — Ambulatory Visit (INDEPENDENT_AMBULATORY_CARE_PROVIDER_SITE_OTHER): Payer: Medicare Other | Admitting: Emergency Medicine

## 2022-06-01 DIAGNOSIS — R053 Chronic cough: Secondary | ICD-10-CM | POA: Diagnosis not present

## 2022-06-01 DIAGNOSIS — G4733 Obstructive sleep apnea (adult) (pediatric): Secondary | ICD-10-CM | POA: Diagnosis not present

## 2022-06-01 DIAGNOSIS — J4520 Mild intermittent asthma, uncomplicated: Secondary | ICD-10-CM | POA: Diagnosis not present

## 2022-06-01 DIAGNOSIS — J309 Allergic rhinitis, unspecified: Secondary | ICD-10-CM | POA: Diagnosis not present

## 2022-06-01 NOTE — Assessment & Plan Note (Signed)
Mild asthma by pulmonary function testing.  She had a significant flare in August/September, lasted for an extended period time but she was unable to take corticosteroids.  She is improved but has daily cough.  We will repeat her pulmonary function testing, determine based on symptoms and her PFT whether to put her on ICS/LABA.  I would choose a nonpowdered formulation, get her off the Advair.

## 2022-06-01 NOTE — Assessment & Plan Note (Signed)
Likely contributor to both her cough and her asthma.  Restart fluticasone nasal spray, restart loratadine

## 2022-06-01 NOTE — Assessment & Plan Note (Signed)
Treating contributing factors as above.  Okay to continue to refill her promethazine/DM as needed

## 2022-06-01 NOTE — Patient Instructions (Addendum)
Agree with stopping Advair We will arrange for repeat pulmonary function testing Keep albuterol available to use 2 puffs if needed for shortness of breath, chest tightness, wheezing, spells of coughing Restart your fluticasone nasal spray, 2 sprays each nostril once daily Restart loratadine 10 mg (generic Claritin) once daily. Follow with Dr. Lamonte Sakai next available after your pulmonary function testing so we can review the results together.

## 2022-06-01 NOTE — Assessment & Plan Note (Signed)
Unable to tolerate CPAP 

## 2022-06-01 NOTE — Progress Notes (Signed)
Subjective:    Patient ID: Kathleen Solis, female    DOB: [birth year], 56 y.o.   MRN: [id number]  HPI  ROV 09/15/21 --Ms. Bisceglia follows up today for her chronic cough.  She has mild obstructive lung disease with spirometry consistent with mild intermittent asthma.  Also with allergic rhinitis, GERD which are contributors.  We performed a split-night sleep study 08/12/2021 for fatigue and suspected OSA. She underwent bronchoscopy on 08/30/2021.  The airway inspection was significant for redundant upper airway tissue and inflammation consistent with LPR.  She reports that her cough is about the same. She has used prometh-DM about bid. On flonase. She continues to have some daytime   Cell count 08/30/2021 shows intra and extracellular organisms.  Culture with Prevotella Malanagenica that is beta-lactamase positive.  Fungal stain and culture negative.  AFB stain negative  Split-night sleep study 08/12/2021 showed an AHI of 6.4/hour, 9.9/h in rem sleep, 85.7 while supine.  Consistent with mild OSA and mild oxygen desaturation. She did not meet split night criteria  ROV 06/01/22 --56 year old woman who follows up today for chronic cough, mild intermittent asthma with rhinitis and GERD.  Also mild obstructive sleep apnea, was unable to tolerate CPAP. BAL 08/30/2021 with Prevotella, beta-lactamase positive, other studies negative Has been treated with fluticasone nasal spray, loratadine, promethazine/DM cough syrup.  Not currently on reflux medication-she has never benefited from this. She was dealing with flaring cough and UA noise / wheeze in August / September. Was treated with abx, cannot tolerate prednisone. She was also started on Advair, stopped quickly due to UA irritation. Not feeling any reflux. She stopped flonase, loratdine during her flare-up.       Review of Systems  Constitutional:  Negative for fever and unexpected weight change.  HENT:  Negative for congestion, dental problem, ear pain,  nosebleeds, postnasal drip, rhinorrhea, sinus pressure, sneezing, sore throat and trouble swallowing.   Eyes:  Negative for redness and itching.  Respiratory:  Positive for cough. Negative for chest tightness, shortness of breath and wheezing.   Cardiovascular:  Negative for palpitations and leg swelling.  Gastrointestinal:  Negative for nausea and vomiting.  Genitourinary:  Negative for dysuria.  Musculoskeletal:  Negative for joint swelling.  Skin:  Negative for rash.  Neurological:  Negative for headaches.  Hematological:  Does not bruise/bleed easily.  Psychiatric/Behavioral:  Negative for dysphoric mood. The patient is not nervous/anxious.      Past Medical History:  Diagnosis Date   Allergic rhinitis 08/16/2016   Benign essential hypertension 03/03/2015   Bone spur of right foot 02/08/2016   Bunion of left foot 02/08/2016   Carotid arterial disease 04/10/2016   03/2016: right 40-59%, left < 40%; 10/2020 carotid US: No evidence of B ICA stenosis   Chest pain 02/14/2017   Cyst of left ovary 03/03/2015   Diverticulitis of colon without hemorrhage 02/25/2016   Epigastric pain 05/06/2020   Episodic tension-type headache 01/12/2016   Taking motrin 200 mg bid   GERD (gastroesophageal reflux disease)    History of cerebrovascular accident (CVA) with residual deficit 2001   Continue aspirin 81 mg daily, statin and current blood pressure medications; Blood pressure has been well controlled and cholesterol has been at goal   Hyperglycemia 02/14/2017   Chronic; encouraged low sugar/carbohydrate diet and regular exercise   Hyperlipidemia    diet controlled - no med   Leg neuralgia, right    Related to CVA    Lumbar back pain 05/10/2018   Lumbar radiculopathy  05/15/2018   Pain seems consistent with radiculopathy   MCI (mild cognitive impairment) with memory loss 12/31/2020   Midline cystocele    Mild asthma 08/27/2020   no inhaler   Onychomycosis 02/21/2018   Toenails; Ciclopirox  prescribed   Sciatica    right   Sinus infection 03/22/2019   Sleep apnea    recently dx, appt on 09/15/21 with MD to discuss results/CPAP   Stroke [facility])    Mini strokes per patient   SVD (spontaneous vaginal delivery)    x 2   Tinnitus of right ear 11/16/2016   no current problem as of 08/26/21 per patient   Vitamin B12 deficiency 02/23/2018     Family History  Problem Relation Age of Onset   Breast cancer Sister    Diabetes Sister    Glaucoma Sister    Cataracts Sister    Hypertension Sister    Anuerysm Mother 29   Pancreatic cancer Father    Retinal detachment Brother    Cataracts Sister    Breast cancer Cousin    Anuerysm Cousin    Breast cancer Niece    Esophageal cancer Nephew    Developmental delay Nephew    Cerebral palsy Son        mild   Colon polyps Neg Hx    Prostate cancer Neg Hx    Rectal cancer Neg Hx    Stomach cancer Neg Hx    Uterine cancer Neg Hx      Social History   Socioeconomic History   Marital status: Single    Spouse name: Not on file   Number of children: 2   Years of education: 14   Highest education level: Associate degree: academic program  Occupational History   Occupation: Disability  Tobacco Use   Smoking status: Never   Smokeless tobacco: Never  Vaping Use   Vaping Use: Never used  Substance and Sexual Activity   Alcohol use: No    Alcohol/week: 0.0 standard drinks of alcohol   Drug use: No   Sexual activity: Not on file    Comment: Hysterectomy  Other Topics Concern   Not on file  Social History Narrative   No regular exercise      Pt is right handed, she occasionally drinks tea, walks QOD. She lives with her 35 yr old son, he has mild cerebral palsy.   Social Determinants of Health   Financial Resource Strain: Low Risk  (05/12/2022)   Overall Financial Resource Strain (CARDIA)    Difficulty of Paying Living Expenses: Not hard at all  Food Insecurity: No Food Insecurity (05/12/2022)   Hunger Vital Sign    Worried  About Running Out of Food in the Last Year: Never true    Ran Out of Food in the Last Year: Never true  Transportation Needs: No Transportation Needs (05/12/2022)   PRAPARE - Hydrologist (Medical): No    Lack of Transportation (Non-Medical): No  Physical Activity: Sufficiently Active (05/12/2022)   Exercise Vital Sign    Days of Exercise per Week: 5 days    Minutes of Exercise per Session: 30 min  Recent Concern: Physical Activity - Inactive (05/12/2022)   Exercise Vital Sign    Days of Exercise per Week: 0 days    Minutes of Exercise per Session: 0 min  Stress: No Stress Concern Present (05/12/2022)   [place]    Feeling of Stress :  Not at all  Social Connections: Moderately Integrated (05/12/2022)   Social Connection and Isolation Panel [NHANES]    Frequency of Communication with Friends and Family: More than three times a week    Frequency of Social Gatherings with Friends and Family: More than three times a week    Attends Religious Services: More than 4 times per year    Active Member of [employer] or Organizations: Yes    Attends Archivist Meetings: More than 4 times per year    Marital Status: Never married  Intimate Partner Violence: Not At Risk (05/12/2022)   Humiliation, Afraid, Rape, and Kick questionnaire    Fear of Current or Ex-Partner: No    Emotionally Abused: No    Physically Abused: No    Sexually Abused: No  has worked office work, 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 [place]  Allergies  Allergen Reactions   Benzonatate Other (See Comments)    Headaches   Darvon [Propoxyphene] Other (See Comments)    Gi problems   Ibuprofen     tinnitus   Morphine And Related Itching   Prednisone Other (See Comments)    Make her head hurt   Sulfa Antibiotics Hives   Tussionex Pennkinetic Er [Hydrocod Poli-Chlorphe Poli Er] Other (See Comments)    Nausea, vomiting   Buprenorphine Hcl Itching    Ivp Dye [Iodinated Contrast Media] Nausea And Vomiting   Prochlorperazine Edisylate Itching and Other (See Comments)    "Acts weird: Hallucinations"     Outpatient Medications Prior to Visit  Medication Sig Dispense Refill   acetaminophen (TYLENOL) 500 MG tablet Take 500 mg by mouth every 6 (six) hours as needed.     aspirin EC 81 MG tablet Take 1 tablet (81 mg total) by mouth in the morning. Swallow whole.     atorvastatin (LIPITOR) 10 MG tablet TAKE ONE TABLET BY MOUTH AT BEDTIME 90 tablet 1   colchicine 0.6 MG tablet Take 1 tablet (0.6 mg total) by mouth every 6 (six) hours as needed (gout pain). 60 tablet 0   hydrochlorothiazide (HYDRODIURIL) 25 MG tablet TAKE ONE TABLET BY MOUTH DAILY (Patient taking differently: Take 25 mg by mouth daily.) 90 tablet 2   metoprolol succinate (TOPROL-XL) 100 MG 24 hr tablet TAKE ONE TABLET BY MOUTH AT BEDTIME 90 tablet 1   promethazine-dextromethorphan (PROMETHAZINE-DM) 6.25-15 MG/5ML syrup Take 5 mLs by mouth 4 (four) times daily as needed for cough. 240 mL 0   fluconazole (DIFLUCAN) 150 MG tablet Take 1 tablet (150 mg total) by mouth every 3 (three) days. (Patient not taking: Reported on 06/01/2022) 3 tablet 0   No facility-administered medications prior to visit.        Objective:   Physical Exam Vitals:   06/01/22 1458  BP: 126/80  Pulse: 62  Temp: 97.8 F (36.6 C)  TempSrc: Oral  SpO2: 99%  Weight: 188 lb 9.6 oz (85.5 kg)  Height: _0  (1.651 m)   Gen: Pleasant, well-nourished, in no distress,  normal affect  ENT: No lesions,  mouth clear,  oropharynx clear, no postnasal drip, strong voice, no throat clearing  Neck: No JVD, no stridor  Lungs: No use of accessory muscles, no crackles or wheezing on normal respiration, no wheeze on forced expiration  Cardiovascular: RRR, heart sounds normal, no murmur or gallops, no peripheral edema  Musculoskeletal: No deformities, no cyanosis or clubbing  Neuro: alert, awake, non focal  Skin:  Warm, no lesions or rash      Assessment &  Plan:  Mild asthma - Dr Lamonte Sakai Mild asthma by pulmonary function testing.  She had a significant flare in August/September, lasted for an extended period time but she was unable to take corticosteroids.  She is improved but has daily cough.  We will repeat her pulmonary function testing, determine based on symptoms and her PFT whether to put her on ICS/LABA.  I would choose a nonpowdered formulation, get her off the Advair.  Obstructive sleep apnea Unable to tolerate CPAP  Allergic rhinitis Likely contributor to both her cough and her asthma.  Restart fluticasone nasal spray, restart loratadine  Chronic cough Treating contributing factors as above.  Okay to continue to refill her promethazine/DM as needed  Baltazar Apo, MD, PhD 06/01/2022, 3:21 PM [facility] [phone] or if no answer [phone]

## 2022-06-13 ENCOUNTER — Telehealth: Payer: Self-pay | Admitting: Emergency Medicine

## 2022-06-13 MED ORDER — PROMETHAZINE-DM 6.25-15 MG/5ML PO SYRP: 5.0000 mL | ORAL_SOLUTION | Freq: Four times a day (QID) | ORAL | 0 refills | Status: DC | PRN

## 2022-06-13 MED ORDER — ALBUTEROL SULFATE HFA 108 (90 BASE) MCG/ACT IN AERS: 2.0000 | INHALATION_SPRAY | Freq: Four times a day (QID) | RESPIRATORY_TRACT | 6 refills | Status: DC | PRN

## 2022-06-13 MED ORDER — LORATADINE 10 MG PO TABS: 10.0000 mg | ORAL_TABLET | Freq: Every day | ORAL | 2 refills | Status: DC

## 2022-06-13 MED ORDER — FLUTICASONE PROPIONATE 50 MCG/ACT NA SUSP
2.0000 | Freq: Every day | NASAL | 2 refills | Status: DC
Start: 2022-06-13 — End: 2022-12-28

## 2022-06-13 NOTE — Telephone Encounter (Signed)
Spoke with pt and placed orders for fluticasone, albuterol HFA, loratadine and promethazine / DM. Pt verified pharmacy as [facility] on [address]. Nothing further needed at this time.

## 2022-06-14 NOTE — Progress Notes (Unsigned)
NEUROLOGY FOLLOW UP OFFICE NOTE  Kathleen Solis [id number]  Assessment/Plan:     Mild neurocognitive disorder- unclear etiology but may be vascular due to lack of alternative conditions. Chronic tension type headache History of left hemispheric stroke Obstructive sleep apnea     For headache - Limit use of pain relievers (such as tylenol) to no more than 2 days out of week to prevent rebound headache Secondary stroke prevention as managed by PCP:             - ASA 81mg  daily             - LDL goal less than 70             - Hgb A1c goal less than 7             - Normotensive blood pressure Weight loss to treat OSA Repreat neuropsychological evaluation in 6 months.. Follow up after repeat neurocognitive testing.      Subjective:  Kathleen Solis is a 56 year old right-handed woman with hypertension, hyperlipidemia, carotid artery disease, tension type headache and history of stroke who follows up for cognitive deficits.   UPDATE: ***   Current NSAIDS:  She stopped her ASA.  She is concerned about possible side effects.  Current analgesics:  Takes Extra-Strength Tylenol for the headaches. Current triptans:  none Current ergotamine:  none Current anti-emetic:  none Current muscle relaxants:  none Current anti-anxiolytic:  none Current sleep aide:  none Current Antihypertensive medications:  Toprol XL; HCTZ Current Antidepressant medications:  none Current Anticonvulsant medications:  none Current anti-CGRP:  none Current Vitamins/Herbal/Supplements:  none Current Antihistamines/Decongestants:  Flonase Other therapy:  rest  Sleep:  ***.  Has OSA.  Cannot tolerate CPAP.  Currently trying to lose weight.       HISTORY: In 2001, she had a stroke while pregnant with her son.  She was told it was due to elevated blood pressure.  She presented with slurred speech and right leg weakness.  As a result, her son was born with cerebral palsy.  She has been followed by multiple  neurologists.   Since the stroke, she has been on ASA 81mg  daily.  Non-contrast MRI brain report from 02/15/14 demonstrated minimal scattered T2 hyperintensities in the left periventricular white matter regions suggesting chronic microvascular degenerative disease but no acute infarcts, mass lesions or demyelinating disease.  This was also noted on prior MRI from 2012.   She presented to [facility] on 11/24/15 for acute onset right sided headache and numbness with trouble walking.  CT of head was personally reviewed and revealed no acute findings.  MRI of brain was ordered but she left AMA.  She followed up with neurologist, Dr. Chales Salmon the following month.  MRI of brain with and without contrast performed on 01/19/16 again demonstrated patchy nonspecific cerebral white matter signal changes in left frontal lobe and periatrial white matter, possibly indicative of remote ischemic stroke.  Abnormal flow in the left cervical ICA noted as well.  Carotid doppler from 03/16/16 reportedly showed no hemodynamically significant stenosis.  A repeat MRI of the brain from 11/30/16 was personally reviewed to evaluate tinnitus in the right ear and again demonstrated chronic white matter changes, left greater than right, in watershed distribution, but no acute findings.  Carotid ultrasound on 11/13/2020 showed no hemodynamically significant ICA stenosis.  Echocardiogram on 12/30/2020 showed EF 72% with no regional wall motion abnormalities or other abnormalities.     She was  treated for episodic tension type headaches as well.  The are moderate intensity non-throbbing headache on the top of her head.  They last 15 to 20 minutes and occur daily.  There are no associated symptoms such as nausea, photophobia, phonophobia or unilateral numbness or weakness.  She was always hesitant about starting a preventative.  She treated them with ibuprofen until she stopped because it was found to be the cause of her tinnitus.    She was previously on atorvastatin 40mg  but stopped due to leg pain.   She has been on disability since 2007.  She continues to have right leg weakness and trouble with speech.  She has memory deficits.  She is overall independent.  On one occasion, she got lost while driving on familiar route.  She usually uses a GPS.    Keeps losing her phone.  She is forgetting recent conversations.  B12 in March 2022 was 171.  She was advised to start April 2022 daily.  MRI of brain without contrast on 11/28/2020 showed chronic left frontoparietal infarct but no acute intracranial abnormality.  She underwent neuropsychological evaluation in May 2022 showed evidence of mild neurocognitive disorder with variability across visuospatial abilities.  Etiology unclear - not completely convincing for vascular etiology but  at this time most likely given lack of alternatives (does not exhibit the profile of either Alzheimer's or Lewy Body dementia and does not exhibit evidence of demyelinating disease on MRI).       Since her stroke, she has insomnia.  She previously had a sleep study that demonstrated mild OSA not requiring CPAP.   She denies depression.   Past medications:  gabapentin  PAST MEDICAL HISTORY: Past Medical History:  Diagnosis Date   Allergic rhinitis 08/16/2016   Benign essential hypertension 03/03/2015   Bone spur of right foot 02/08/2016   Bunion of left foot 02/08/2016   Carotid arterial disease 04/10/2016   03/2016: right 40-59%, left < 40%; 10/2020 carotid 11/2020: No evidence of B ICA stenosis   Chest pain 02/14/2017   Cyst of left ovary 03/03/2015   Diverticulitis of colon without hemorrhage 02/25/2016   Epigastric pain 05/06/2020   Episodic tension-type headache 01/12/2016   Taking motrin 200 mg bid   GERD (gastroesophageal reflux disease)    History of cerebrovascular accident (CVA) with residual deficit 2001   Continue aspirin 81 mg daily, statin and current blood pressure medications; Blood  pressure has been well controlled and cholesterol has been at goal   Hyperglycemia 02/14/2017   Chronic; encouraged low sugar/carbohydrate diet and regular exercise   Hyperlipidemia    diet controlled - no med   Leg neuralgia, right    Related to CVA    Lumbar back pain 05/10/2018   Lumbar radiculopathy 05/15/2018   Pain seems consistent with radiculopathy   MCI (mild cognitive impairment) with memory loss 12/31/2020   Midline cystocele    Mild asthma 08/27/2020   no inhaler   Onychomycosis 02/21/2018   Toenails; Ciclopirox prescribed   Sciatica    right   Sinus infection 03/22/2019   Sleep apnea    recently dx, appt on 09/15/21 with MD to discuss results/CPAP   Stroke [facility])    Mini strokes per patient   SVD (spontaneous vaginal delivery)    x 2   Tinnitus of right ear 11/16/2016   no current problem as of 08/26/21 per patient   Vitamin B12 deficiency 02/23/2018    MEDICATIONS: Current Outpatient Medications on File Prior to Visit  Medication Sig Dispense Refill   acetaminophen (TYLENOL) 500 MG tablet Take 500 mg by mouth every 6 (six) hours as needed.     albuterol (VENTOLIN HFA) 108 (90 Base) MCG/ACT inhaler Inhale 2 puffs into the lungs every 6 (six) hours as needed for wheezing or shortness of breath. 8 g 6   aspirin EC 81 MG tablet Take 1 tablet (81 mg total) by mouth in the morning. Swallow whole.     atorvastatin (LIPITOR) 10 MG tablet TAKE ONE TABLET BY MOUTH AT BEDTIME 90 tablet 1   colchicine 0.6 MG tablet Take 1 tablet (0.6 mg total) by mouth every 6 (six) hours as needed (gout pain). 60 tablet 0   fluconazole (DIFLUCAN) 150 MG tablet Take 1 tablet (150 mg total) by mouth every 3 (three) days. (Patient not taking: Reported on 06/01/2022) 3 tablet 0   fluticasone (FLONASE) 50 MCG/ACT nasal spray Place 2 sprays into both nostrils daily. 16 g 2   hydrochlorothiazide (HYDRODIURIL) 25 MG tablet TAKE ONE TABLET BY MOUTH DAILY (Patient taking differently: Take 25 mg by mouth  daily.) 90 tablet 2   loratadine (CLARITIN) 10 MG tablet Take 1 tablet (10 mg total) by mouth daily. 30 tablet 2   metoprolol succinate (TOPROL-XL) 100 MG 24 hr tablet TAKE ONE TABLET BY MOUTH AT BEDTIME 90 tablet 1   promethazine-dextromethorphan (PROMETHAZINE-DM) 6.25-15 MG/5ML syrup Take 5 mLs by mouth 4 (four) times daily as needed for cough. 240 mL 0   No current facility-administered medications on file prior to visit.    ALLERGIES: Allergies  Allergen Reactions   Benzonatate Other (See Comments)    Headaches   Darvon [Propoxyphene] Other (See Comments)    Gi problems   Ibuprofen     tinnitus   Morphine And Related Itching   Prednisone Other (See Comments)    Make her head hurt   Sulfa Antibiotics Hives   Tussionex Pennkinetic Er [Hydrocod Poli-Chlorphe Poli Er] Other (See Comments)    Nausea, vomiting   Buprenorphine Hcl Itching   Ivp Dye [Iodinated Contrast Media] Nausea And Vomiting   Prochlorperazine Edisylate Itching and Other (See Comments)    "Acts weird: Hallucinations"    FAMILY HISTORY: Family History  Problem Relation Age of Onset   Breast cancer Sister    Diabetes Sister    Glaucoma Sister    Cataracts Sister    Hypertension Sister    Anuerysm Mother 76   Pancreatic cancer Father    Retinal detachment Brother    Cataracts Sister    Breast cancer Cousin    Anuerysm Cousin    Breast cancer Niece    Esophageal cancer Nephew    Developmental delay Nephew    Cerebral palsy Son        mild   Colon polyps Neg Hx    Prostate cancer Neg Hx    Rectal cancer Neg Hx    Stomach cancer Neg Hx    Uterine cancer Neg Hx       Objective:  *** General: No acute distress.  Patient appears well-groomed.   Head:  Normocephalic/atraumatic Eyes:  Fundi examined but not visualized Neck: supple, no paraspinal tenderness, full range of motion Heart:  Regular rate and rhythm Neurological Exam: alert and oriented to person, place, and time.  Speech fluent and not  dysarthric, language intact.  CN II-XII intact. Bulk and tone normal, muscle strength 5/5 throughout.  Sensation to light touch intact.  Deep tendon reflexes 2+ throughout, toes downgoing.  Finger to nose testing intact.  Gait normal, Romberg negative.   Shon Millet, DO  CC: Cheryll Cockayne, MD

## 2022-06-15 ENCOUNTER — Encounter: Payer: Self-pay | Admitting: Neurology

## 2022-06-15 ENCOUNTER — Ambulatory Visit (INDEPENDENT_AMBULATORY_CARE_PROVIDER_SITE_OTHER): Payer: Medicare Other | Admitting: Neurology

## 2022-06-15 VITALS — BP 124/83 | HR 62 | Resp 18 | Ht 65.5 in | Wt 189.0 lb

## 2022-06-15 DIAGNOSIS — I6930 Unspecified sequelae of cerebral infarction: Secondary | ICD-10-CM | POA: Diagnosis not present

## 2022-06-15 DIAGNOSIS — G4733 Obstructive sleep apnea (adult) (pediatric): Secondary | ICD-10-CM

## 2022-06-15 DIAGNOSIS — G44219 Episodic tension-type headache, not intractable: Secondary | ICD-10-CM

## 2022-06-15 DIAGNOSIS — G3184 Mild cognitive impairment, so stated: Secondary | ICD-10-CM | POA: Diagnosis not present

## 2022-06-15 NOTE — Addendum Note (Signed)
Addended by: Venetia Night on: 06/15/2022 09:59 AM   Modules accepted: Orders

## 2022-06-15 NOTE — Patient Instructions (Signed)
Repeat neuropsychological evaluation Continue aspirin 81mg  daily Limit use of pain relievers to no more than 2 days out of week to prevent risk of rebound or medication-overuse headache. Follow up after repeat testing.

## 2022-06-16 ENCOUNTER — Ambulatory Visit: Payer: Medicare PPO | Admitting: Neurology

## 2022-06-22 NOTE — Progress Notes (Unsigned)
      Subjective:    Patient ID: Kathleen Solis, female    DOB: Feb 11, 1966, 56 y.o.   MRN: [id number]     HPI Dorothy is here for follow up of her chronic medical problems, including htn, hld, hyperglycemia  She is taking all of her medications as prescribed.    Medications and allergies reviewed with patient and updated if appropriate.  Current Outpatient Medications on File Prior to Visit  Medication Sig Dispense Refill   acetaminophen (TYLENOL) 500 MG tablet Take 500 mg by mouth every 6 (six) hours as needed.     albuterol (VENTOLIN HFA) 108 (90 Base) MCG/ACT inhaler Inhale 2 puffs into the lungs every 6 (six) hours as needed for wheezing or shortness of breath. 8 g 6   aspirin EC 81 MG tablet Take 1 tablet (81 mg total) by mouth in the morning. Swallow whole.     atorvastatin (LIPITOR) 10 MG tablet TAKE ONE TABLET BY MOUTH AT BEDTIME 90 tablet 1   colchicine 0.6 MG tablet Take 1 tablet (0.6 mg total) by mouth every 6 (six) hours as needed (gout pain). 60 tablet 0   fluconazole (DIFLUCAN) 150 MG tablet Take 1 tablet (150 mg total) by mouth every 3 (three) days. 3 tablet 0   fluticasone (FLONASE) 50 MCG/ACT nasal spray Place 2 sprays into both nostrils daily. 16 g 2   hydrochlorothiazide (HYDRODIURIL) 25 MG tablet TAKE ONE TABLET BY MOUTH DAILY (Patient taking differently: Take 25 mg by mouth daily.) 90 tablet 2   loratadine (CLARITIN) 10 MG tablet Take 1 tablet (10 mg total) by mouth daily. 30 tablet 2   metoprolol succinate (TOPROL-XL) 100 MG 24 hr tablet TAKE ONE TABLET BY MOUTH AT BEDTIME 90 tablet 1   promethazine-dextromethorphan (PROMETHAZINE-DM) 6.25-15 MG/5ML syrup Take 5 mLs by mouth 4 (four) times daily as needed for cough. 240 mL 0   No current facility-administered medications on file prior to visit.     Review of Systems     Objective:  There were no vitals filed for this visit. BP Readings from Last 3 Encounters:  06/15/22 124/83  06/01/22 126/80  05/17/22  110/80   Wt Readings from Last 3 Encounters:  06/15/22 189 lb (85.7 kg)  06/01/22 188 lb 9.6 oz (85.5 kg)  05/17/22 186 lb (84.4 kg)   There is no height or weight on file to calculate BMI.    Physical Exam     Lab Results  Component Value Date   WBC 7.3 04/17/2022   HGB 13.8 04/17/2022   HCT 38.1 04/17/2022   PLT 203 04/17/2022   GLUCOSE 90 04/17/2022   CHOL 148 12/21/2021   TRIG 78.0 12/21/2021   HDL 54.20 12/21/2021   LDLCALC 78 12/21/2021   ALT 21 12/21/2021   AST 26 12/21/2021   NA 141 04/17/2022   K 3.5 04/17/2022   CL 103 04/17/2022   CREATININE 0.82 04/17/2022   BUN 11 04/17/2022   CO2 28 04/17/2022   TSH 2.02 12/21/2021   INR 1.18 11/24/2015   HGBA1C 5.1 12/21/2021     Assessment & Plan:    See Problem List for Assessment and Plan of chronic medical problems.

## 2022-06-23 ENCOUNTER — Encounter: Payer: Self-pay | Admitting: Internal Medicine

## 2022-06-23 ENCOUNTER — Ambulatory Visit (INDEPENDENT_AMBULATORY_CARE_PROVIDER_SITE_OTHER): Payer: Medicare Other | Admitting: Internal Medicine

## 2022-06-23 VITALS — BP 120/78 | HR 62 | Temp 98.6°F | Ht 65.5 in | Wt 188.2 lb

## 2022-06-23 DIAGNOSIS — I6930 Unspecified sequelae of cerebral infarction: Secondary | ICD-10-CM

## 2022-06-23 DIAGNOSIS — R739 Hyperglycemia, unspecified: Secondary | ICD-10-CM

## 2022-06-23 DIAGNOSIS — Z8739 Personal history of other diseases of the musculoskeletal system and connective tissue: Secondary | ICD-10-CM

## 2022-06-23 DIAGNOSIS — E538 Deficiency of other specified B group vitamins: Secondary | ICD-10-CM | POA: Diagnosis not present

## 2022-06-23 DIAGNOSIS — E782 Mixed hyperlipidemia: Secondary | ICD-10-CM

## 2022-06-23 DIAGNOSIS — I10 Essential (primary) hypertension: Secondary | ICD-10-CM

## 2022-06-23 HISTORY — DX: Personal history of other diseases of the musculoskeletal system and connective tissue: Z87.39

## 2022-06-23 LAB — COMPREHENSIVE METABOLIC PANEL
ALT: 13 U/L (ref 0–35)
AST: 20 U/L (ref 0–37)
Albumin: 4.6 g/dL (ref 3.5–5.2)
Alkaline Phosphatase: 86 U/L (ref 39–117)
BUN: 14 mg/dL (ref 6–23)
CO2: 33 mEq/L — ABNORMAL HIGH (ref 19–32)
Calcium: 10.4 mg/dL (ref 8.4–10.5)
Chloride: 99 mEq/L (ref 96–112)
Creatinine, Ser: 0.79 mg/dL (ref 0.40–1.20)
GFR: 84.15 mL/min (ref >60.00)
Glucose, Bld: 83 mg/dL (ref 70–99)
Potassium: 3.5 mEq/L (ref 3.5–5.1)
Sodium: 142 mEq/L (ref 135–145)
Total Bilirubin: 1 mg/dL (ref 0.2–1.2)
Total Protein: 8.2 g/dL (ref 6.0–8.3)

## 2022-06-23 LAB — HEMOGLOBIN A1C: Hgb A1c MFr Bld: 5 % (ref 4.6–6.5)

## 2022-06-23 LAB — VITAMIN B12: Vitamin B-12: 286 pg/mL (ref 211–911)

## 2022-06-23 LAB — LIPID PANEL
Cholesterol: 162 mg/dL (ref 0–200)
HDL: 64.9 mg/dL (ref >39.00)
LDL Cholesterol: 77 mg/dL (ref 0–99)
NonHDL: 96.97
Total CHOL/HDL Ratio: 2
Triglycerides: 100 mg/dL (ref 0.0–149.0)
VLDL: 20 mg/dL (ref 0.0–40.0)

## 2022-06-23 LAB — URIC ACID: Uric Acid, Serum: 6.5 mg/dL (ref 2.4–7.0)

## 2022-06-23 NOTE — Assessment & Plan Note (Signed)
Chronic Blood pressure well controlled CMP Continue HCTZ 25 mg daily, metoprolol XL 100 mg daily

## 2022-06-23 NOTE — Assessment & Plan Note (Signed)
Chronic Not currently taking B12 Check B12 level 

## 2022-06-23 NOTE — Assessment & Plan Note (Signed)
?   Episode of gout in left ankle 04/2022 Check uric acid

## 2022-06-23 NOTE — Assessment & Plan Note (Signed)
H/o CVA Continue ASA 81 mg daily, atorvastatin 10 mg daily BP controlled

## 2022-06-23 NOTE — Patient Instructions (Addendum)
      Blood work was ordered.   The lab is on the first floor.    Medications changes include :   none      Return in about 6 months (around 12/22/2022) for Physical Exam.

## 2022-06-23 NOTE — Assessment & Plan Note (Addendum)
Chronic Regular exercise and healthy diet encouraged Check lipid panel  Continue atorvastatin 10 mg daily 

## 2022-06-23 NOTE — Assessment & Plan Note (Signed)
Chronic Check a1c Low sugar / carb diet Stressed regular exercise  

## 2022-06-27 ENCOUNTER — Telehealth: Payer: Self-pay | Admitting: Internal Medicine

## 2022-06-27 NOTE — Telephone Encounter (Signed)
PT visits today with a Handicap Parking Placard form to be filled out by Dr.Burns. PT stated that she would be happy to talk to Dr.Burns and staff regarding the reasoning for this Handicap Placard request.  PT stated that she had seen two different providers in the past about her sciatica and is currently experiencing another flare up.  Form has been placed in Dr.Burns' mailbox.   CB: [phone]

## 2022-06-27 NOTE — Telephone Encounter (Signed)
Form given to Dr. Quay Burow to complete

## 2022-06-28 NOTE — Telephone Encounter (Signed)
Spoke with patient.  Form left upfront for pick up.

## 2022-07-11 ENCOUNTER — Other Ambulatory Visit: Payer: Self-pay | Admitting: Obstetrics and Gynecology

## 2022-07-11 DIAGNOSIS — Z1231 Encounter for screening mammogram for malignant neoplasm of breast: Secondary | ICD-10-CM

## 2022-07-21 ENCOUNTER — Ambulatory Visit: Payer: Medicare Other | Admitting: Emergency Medicine

## 2022-07-21 ENCOUNTER — Telehealth: Payer: Self-pay | Admitting: Emergency Medicine

## 2022-07-22 MED ORDER — PROMETHAZINE-DM 6.25-15 MG/5ML PO SYRP
5.0000 mL | ORAL_SOLUTION | Freq: Four times a day (QID) | ORAL | 0 refills | Status: DC | PRN
Start: 2022-07-22 — End: 2022-08-11

## 2022-07-22 NOTE — Telephone Encounter (Signed)
Pt is requesting refill on PROMETHAZINE-DM. Dr. Delton Coombes please advise.

## 2022-07-22 NOTE — Telephone Encounter (Signed)
[facility] left detailed VM that refill had been called in. Nothing further needed at this time.

## 2022-07-22 NOTE — Telephone Encounter (Signed)
I refilled for her 

## 2022-07-28 ENCOUNTER — Ambulatory Visit (INDEPENDENT_AMBULATORY_CARE_PROVIDER_SITE_OTHER): Payer: Medicare Other | Admitting: Emergency Medicine

## 2022-07-28 ENCOUNTER — Encounter: Payer: Self-pay | Admitting: Emergency Medicine

## 2022-07-28 VITALS — BP 136/78 | HR 68 | Temp 98.1°F | Ht 65.5 in | Wt 191.0 lb

## 2022-07-28 DIAGNOSIS — J309 Allergic rhinitis, unspecified: Secondary | ICD-10-CM

## 2022-07-28 DIAGNOSIS — R053 Chronic cough: Secondary | ICD-10-CM | POA: Diagnosis not present

## 2022-07-28 DIAGNOSIS — J4520 Mild intermittent asthma, uncomplicated: Secondary | ICD-10-CM

## 2022-07-28 LAB — PULMONARY FUNCTION TEST
DL/VA % pred: 101 %
DL/VA: 4.28 ml/min/mmHg/L
DLCO cor % pred: 86 %
DLCO cor: 18.82 ml/min/mmHg
DLCO unc % pred: 86 %
DLCO unc: 18.82 ml/min/mmHg
FEF 25-75 Post: 2.97 L/sec
FEF 25-75 Pre: 2.73 L/sec
FEF2575-%Change-Post: 8 %
FEF2575-%Pred-Post: 111 %
FEF2575-%Pred-Pre: 102 %
FEV1-%Change-Post: 1 %
FEV1-%Pred-Post: 85 %
FEV1-%Pred-Pre: 84 %
FEV1-Post: 2.45 L
FEV1-Pre: 2.42 L
FEV1FVC-%Change-Post: 4 %
FEV1FVC-%Pred-Pre: 104 %
FEV6-%Change-Post: -2 %
FEV6-%Pred-Post: 80 %
FEV6-%Pred-Pre: 82 %
FEV6-Post: 2.85 L
FEV6-Pre: 2.92 L
FEV6FVC-%Change-Post: 0 %
FEV6FVC-%Pred-Post: 103 %
FEV6FVC-%Pred-Pre: 102 %
FVC-%Change-Post: -2 %
FVC-%Pred-Post: 78 %
FVC-%Pred-Pre: 80 %
FVC-Post: 2.85 L
FVC-Pre: 2.92 L
Post FEV1/FVC ratio: 86 %
Post FEV6/FVC ratio: 100 %
Pre FEV1/FVC ratio: 83 %
Pre FEV6/FVC Ratio: 100 %
RV % pred: 154 %
RV: 3.06 L
TLC % pred: 109 %
TLC: 5.84 L

## 2022-07-28 NOTE — Assessment & Plan Note (Signed)
Pulmonary function testing stable with only hyperinflation noted.  FEV1 actually improved compared with 3 years ago.  We talked about possibly starting ICS/LABA and in the end she would like to hold off.  She will keep albuterol available to use if needed

## 2022-07-28 NOTE — Progress Notes (Signed)
Subjective:    Patient ID: Kathleen Solis, female    DOB: [birth year], 56 y.o.   MRN: [id number]  HPI   ROV 06/01/22 --56 year old woman who follows up today for chronic cough, mild intermittent asthma with rhinitis and GERD.  Also mild obstructive sleep apnea, was unable to tolerate CPAP. BAL 08/30/2021 with Prevotella, beta-lactamase positive, other studies negative Has been treated with fluticasone nasal spray, loratadine, promethazine/DM cough syrup.  Not currently on reflux medication-she has never benefited from this. She was dealing with flaring cough and UA noise / wheeze in August / September. Was treated with abx, cannot tolerate prednisone. She was also started on Advair, stopped quickly due to UA irritation. Not feeling any reflux. She stopped flonase, loratdine during her flare-up.   ROV 07/28/2022 --56 year old woman with a history of mild intermittent asthma.  She has allergic rhinitis and GERD which impact both.  She also has mild obstructive sleep apnea, not on CPAP.  She is undergoing bronchoscopy 08/2021.  She is on fluticasone nasal spray, loratadine, promethazine/DM cough syrup.  She has never benefited from GERD medication.  Still deals with chronic cough.  She underwent pulmonary function testing today as below. She reports that her cough is still happening daily. Non-productive. Voice can be labile. Rare albuterol use. She notes a mold exposure in her apartment - just noticed in her apartment. She has a sensitivity to mold on prior allergy testing.   Pulmonary function testing performed today and reviewed by me show normal airflows without a bronchodilator response, hyperinflated lung volumes based on RV, normal diffusion capacity.  The FEV1 is improved compared with 3 years ago.   Review of Systems  Constitutional:  Negative for fever and unexpected weight change.  HENT:  Negative for congestion, dental problem, ear pain, nosebleeds, postnasal drip, rhinorrhea, sinus  pressure, sneezing, sore throat and trouble swallowing.   Eyes:  Negative for redness and itching.  Respiratory:  Positive for cough. Negative for chest tightness, shortness of breath and wheezing.   Cardiovascular:  Negative for palpitations and leg swelling.  Gastrointestinal:  Negative for nausea and vomiting.  Genitourinary:  Negative for dysuria.  Musculoskeletal:  Negative for joint swelling.  Skin:  Negative for rash.  Neurological:  Negative for headaches.  Hematological:  Does not bruise/bleed easily.  Psychiatric/Behavioral:  Negative for dysphoric mood. The patient is not nervous/anxious.         Objective:   Physical Exam Vitals:   07/28/22 1104  BP: 136/78  Pulse: 68  Temp: 98.1 F (36.7 C)  TempSrc: Oral  SpO2: 98%  Weight: 191 lb (86.6 kg)  Height: 5' 5.5" (1.664 m)   Gen: Pleasant, well-nourished, in no distress,  normal affect  ENT: No lesions,  mouth clear,  oropharynx clear, no postnasal drip, strong voice, no throat clearing  Neck: No JVD, no stridor  Lungs: No use of accessory muscles, no crackles or wheezing on normal respiration, no wheeze on forced expiration  Cardiovascular: RRR, heart sounds normal, no murmur or gallops, no peripheral edema  Musculoskeletal: No deformities, no cyanosis or clubbing  Neuro: alert, awake, non focal  Skin: Warm, no lesions or rash      Assessment & Plan:  Mild asthma - Dr Lamonte Sakai Pulmonary function testing stable with only hyperinflation noted.  FEV1 actually improved compared with 3 years ago.  We talked about possibly starting ICS/LABA and in the end she would like to hold off.  She will keep albuterol available to use if needed  Allergic rhinitis Continue current regimen, loratadine and fluticasone nasal spray  Chronic cough Driven more by her upper airway irritation than her asthma.  Treating allergic rhinitis.  She uses promethazine/DM cough suppression.  She has never benefited from GERD therapy and is  not on this currently.  She has albuterol available to use for any asthmatic component  Baltazar Apo, MD, PhD 07/28/2022, 11:58 AM [facility] [phone] or if no answer [phone]

## 2022-07-28 NOTE — Patient Instructions (Signed)
We reviewed your pulmonary function testing today. We will hold off on starting any scheduled inhaler medication for now. Keep your albuterol available to use 2 puffs when needed for shortness of breath, chest tightness, wheezing. Continue your fluticasone nasal spray and your loratadine as you have been using them. Okay to continue to use promethazine/DM cough syrup up to every 6 hours if needed for cough suppression. Agree with working hard to avoid mold exposure.  Your previous allergy testing shows a sensitivity to mold. Follow with Dr. Delton Coombes in 12 months or sooner if you have any problems.

## 2022-07-28 NOTE — Assessment & Plan Note (Signed)
Continue current regimen, loratadine and fluticasone nasal spray

## 2022-07-28 NOTE — Progress Notes (Signed)
PFT done today. 

## 2022-07-28 NOTE — Assessment & Plan Note (Signed)
Driven more by her upper airway irritation than her asthma.  Treating allergic rhinitis.  She uses promethazine/DM cough suppression.  She has never benefited from GERD therapy and is not on this currently.  She has albuterol available to use for any asthmatic component

## 2022-07-31 ENCOUNTER — Other Ambulatory Visit: Payer: Self-pay | Admitting: Internal Medicine

## 2022-08-06 DIAGNOSIS — J40 Bronchitis, not specified as acute or chronic: Secondary | ICD-10-CM | POA: Diagnosis not present

## 2022-08-11 ENCOUNTER — Telehealth: Payer: Self-pay | Admitting: Emergency Medicine

## 2022-08-11 MED ORDER — PROMETHAZINE-DM 6.25-15 MG/5ML PO SYRP
5.0000 mL | ORAL_SOLUTION | Freq: Four times a day (QID) | ORAL | 0 refills | Status: DC | PRN
Start: 2022-08-11 — End: 2022-09-05

## 2022-08-11 NOTE — Telephone Encounter (Signed)
I will refill it for her, order sent

## 2022-08-11 NOTE — Telephone Encounter (Signed)
Called patient this morning and she states she tested positive for the flu last week and is still coughing a lot. She is asking for her promethazine-dextromethorphan cough syrup to be refilled.   Please advise sir

## 2022-08-11 NOTE — Telephone Encounter (Signed)
PT calling to see if her cough syrup be called in?  Ph[facility]: [facility] [address]  [phone] is her CB

## 2022-08-12 NOTE — Telephone Encounter (Signed)
Called and spoke to patient and let her know that Dr Delton Coombes refilled her cough medication that she requested yesterday. Nothing further needed

## 2022-09-05 ENCOUNTER — Telehealth: Payer: Self-pay | Admitting: Emergency Medicine

## 2022-09-05 MED ORDER — PROMETHAZINE-DM 6.25-15 MG/5ML PO SYRP: 5.0000 mL | ORAL_SOLUTION | Freq: Four times a day (QID) | ORAL | 0 refills | Status: DC | PRN

## 2022-09-05 NOTE — Telephone Encounter (Signed)
Called and spoke with patient. She stated that she was seen by RB on 12/07 and was told to call back when she needed a refill on her promethazine DM cough syrup. She is completely out. She stated that she tested positive for influenza A on 12/10 at an urgent care office with [facility] and was told that the cough could last for 6-10 weeks.   The phlegm she is coughing up is thick and clear. She did see some yellowish phlegm after having the flu.   She is still using her albuterol as needed.  She denied any increased SOB, wheezing, fever or chest pain.   [facility] [address].   She wanted to know if RB would refill the cough syrup and if he had any other recommendations for the cough.   RB, can you please advise? Thanks!

## 2022-09-05 NOTE — Telephone Encounter (Signed)
I sent refill order to her pharmacy for her cough med

## 2022-09-06 ENCOUNTER — Ambulatory Visit
Admission: RE | Admit: 2022-09-06 | Discharge: 2022-09-06 | Disposition: A | Discharge status: Home / Self Care | Payer: 59 | Source: Ambulatory Visit | Attending: Obstetrics and Gynecology | Admitting: Obstetrics and Gynecology

## 2022-09-06 DIAGNOSIS — Z1231 Encounter for screening mammogram for malignant neoplasm of breast: Secondary | ICD-10-CM

## 2022-09-28 ENCOUNTER — Ambulatory Visit (INDEPENDENT_AMBULATORY_CARE_PROVIDER_SITE_OTHER): Payer: 59 | Admitting: Family Medicine

## 2022-09-28 ENCOUNTER — Encounter: Payer: Self-pay | Admitting: Family Medicine

## 2022-09-28 VITALS — BP 116/82 | HR 63 | Temp 97.6°F | Wt 189.0 lb

## 2022-09-28 DIAGNOSIS — J029 Acute pharyngitis, unspecified: Secondary | ICD-10-CM

## 2022-09-28 DIAGNOSIS — J019 Acute sinusitis, unspecified: Secondary | ICD-10-CM | POA: Diagnosis not present

## 2022-09-28 DIAGNOSIS — R059 Cough, unspecified: Secondary | ICD-10-CM | POA: Diagnosis not present

## 2022-09-28 LAB — POCT RAPID STREP A (OFFICE): Rapid Strep A Screen: NEGATIVE

## 2022-09-28 LAB — POC COVID19 BINAXNOW: SARS Coronavirus 2 Ag: NEGATIVE

## 2022-09-28 MED ORDER — AMOXICILLIN-POT CLAVULANATE 875-125 MG PO TABS: 1.0000 | ORAL_TABLET | Freq: Two times a day (BID) | ORAL | 0 refills | Status: DC

## 2022-09-28 NOTE — Progress Notes (Signed)
   Subjective:    Patient ID: Kathleen Solis, female    DOB: 06-Jun-1966, 57 y.o.   MRN: [id number]  HPI Here for one week of symptoms that began with sinus congestion, right ear pain, headache, and body aches. Then over the past 2 days she has developed a dry cough and a ST, especially on the left side of the throat. No fever. Drinking fluids and using Flonase and Tylenol.    Review of Systems  Constitutional: Negative.   HENT:  Positive for congestion, ear pain, postnasal drip, sinus pressure and sore throat.   Eyes: Negative.   Respiratory:  Positive for cough. Negative for shortness of breath and wheezing.        Objective:   Physical Exam Constitutional:      General: She is not in acute distress.    Appearance: Normal appearance.  HENT:     Right Ear: Tympanic membrane, ear canal and external ear normal.     Left Ear: Tympanic membrane, ear canal and external ear normal.     Nose: Nose normal.     Mouth/Throat:     Pharynx: Oropharynx is clear.  Eyes:     Conjunctiva/sclera: Conjunctivae normal.  Neck:     Comments: There are some tender AC nodes on the left side  Pulmonary:     Effort: Pulmonary effort is normal.     Breath sounds: Normal breath sounds.  Neurological:     Mental Status: She is alert.           Assessment & Plan:  Sinusitis, treat with 10 days of Augmentin. Alysia Penna, MD

## 2022-09-28 NOTE — Addendum Note (Signed)
Addended by: Wyvonne Lenz on: 09/28/2022 10:48 AM   Modules accepted: Orders

## 2022-10-13 ENCOUNTER — Telehealth: Payer: Self-pay | Admitting: Emergency Medicine

## 2022-10-13 NOTE — Telephone Encounter (Signed)
Patient called to request a refill for her cough medication asap. Please confirm and call patient once it has been called in.  CB# [phone]

## 2022-10-14 NOTE — Telephone Encounter (Signed)
Attempted to call pt but unable to reach. Left message to return call.  

## 2022-10-14 NOTE — Telephone Encounter (Signed)
Dr. Lamonte Sakai, please advise if you are ok with refilling her promethazine cough syrup. Thanks!

## 2022-10-14 NOTE — Telephone Encounter (Signed)
promethazine-dextromethorphan (PROMETHAZINE-DM) pt. Want med called in to [facility] on [address] in [place] she said Dr. Lamonte Sakai would call in if she was to request it he could fill it for her

## 2022-10-14 NOTE — Telephone Encounter (Signed)
Yes I am okay refilling this

## 2022-10-17 MED ORDER — PROMETHAZINE-DM 6.25-15 MG/5ML PO SYRP: 5.0000 mL | ORAL_SOLUTION | Freq: Four times a day (QID) | ORAL | 0 refills | Status: DC | PRN

## 2022-10-17 NOTE — Telephone Encounter (Signed)
Called and spoke with patient. Patient verbalized understanding. Patient verified pharmacy. Rx has been sent to the pharmacy.   Nothing further needed.

## 2022-10-18 DIAGNOSIS — Z20822 Contact with and (suspected) exposure to covid-19: Secondary | ICD-10-CM | POA: Diagnosis not present

## 2022-10-18 DIAGNOSIS — R519 Headache, unspecified: Secondary | ICD-10-CM | POA: Diagnosis not present

## 2022-10-20 ENCOUNTER — Ambulatory Visit (INDEPENDENT_AMBULATORY_CARE_PROVIDER_SITE_OTHER): Payer: 59 | Admitting: Emergency Medicine

## 2022-10-20 ENCOUNTER — Encounter: Payer: Self-pay | Admitting: Emergency Medicine

## 2022-10-20 VITALS — BP 130/82 | HR 77 | Temp 99.2°F | Ht 65.5 in | Wt 188.0 lb

## 2022-10-20 DIAGNOSIS — R6889 Other general symptoms and signs: Secondary | ICD-10-CM | POA: Insufficient documentation

## 2022-10-20 DIAGNOSIS — U071 COVID-19: Secondary | ICD-10-CM | POA: Diagnosis not present

## 2022-10-20 DIAGNOSIS — B9789 Other viral agents as the cause of diseases classified elsewhere: Secondary | ICD-10-CM

## 2022-10-20 DIAGNOSIS — J22 Unspecified acute lower respiratory infection: Secondary | ICD-10-CM

## 2022-10-20 DIAGNOSIS — R053 Chronic cough: Secondary | ICD-10-CM

## 2022-10-20 DIAGNOSIS — J988 Other specified respiratory disorders: Secondary | ICD-10-CM | POA: Diagnosis not present

## 2022-10-20 LAB — POCT INFLUENZA A/B
Influenza A, POC: NEGATIVE
Influenza B, POC: NEGATIVE

## 2022-10-20 LAB — POC COVID19 BINAXNOW: SARS Coronavirus 2 Ag: POSITIVE — AB

## 2022-10-20 LAB — POCT RAPID STREP A (OFFICE): Rapid Strep A Screen: NEGATIVE

## 2022-10-20 MED ORDER — AZITHROMYCIN 250 MG PO TABS: ORAL_TABLET | ORAL | 0 refills | Status: DC

## 2022-10-20 MED ORDER — PROMETHAZINE-CODEINE 6.25-10 MG/5ML PO SOLN: 5.0000 mL | Freq: Four times a day (QID) | ORAL | 0 refills | Status: DC | PRN

## 2022-10-20 NOTE — Assessment & Plan Note (Signed)
Continue over-the-counter Mucinex DM and can also take Phenergan with codeine as needed

## 2022-10-20 NOTE — Patient Instructions (Signed)
COVID-19 COVID-19 is an infection caused by a virus called SARS-CoV-2. Most people who get COVID-19 have mild to moderate symptoms. Some have little to no symptoms. In others, the virus may cause a severe infection. What are the causes? COVID-19 is caused by a coronavirus. The virus may be in the air as droplets or as tiny specks of fluid (aerosols). It may also be on surfaces. You may catch the virus if you: Breathe in droplets when a person with COVID-19 breathes, speaks, sings, coughs, or sneezes. Touch something that has the virus on it and then touch your mouth, nose, or eyes. What increases the risk? Risk for infection: You are more likely to get COVID-19 if: You are within 6 ft (1.8 m) of a person who has COVID-19 for 15 minutes or longer. You provide care to a person who has COVID-19. You are in close contact with others. This includes hugging, kissing, or sharing utensils. Risk for serious illness caused by COVID-19: You are more likely to get very ill from COVID-19 if: You have cancer. You have a long-term (chronic) disease. This may be: A chronic lung disease, such as pulmonary embolism, chronic obstructive pulmonary disease (COPD), or cystic fibrosis. A disease that affects your body's defense system (immune system). If you have a weak immune system, you are said to be immunocompromised. A serious heart condition, such as heart failure, coronary artery disease, or cardiomyopathy. Diabetes. Chronic kidney disease. A liver disease, such as cirrhosis, nonalcoholic fatty liver disease, alcoholic liver disease, or autoimmune hepatitis. You are obese. You are pregnant or were just pregnant. You have sickle cell disease. What are the signs or symptoms? Symptoms of COVID-19 can range from mild to severe. They may appear any time from 2 to 14 days after you are exposed. They include: Fever or chills. Shortness of breath or trouble breathing. Feeling tired. Headaches, body aches, or  muscle aches. A runny or stuffy nose. Sneezing, coughing, or a sore throat. New loss of taste or smell. You may also have stomach problems, such as nausea, vomiting, or diarrhea. In some cases, you may not have any symptoms. How is this diagnosed? COVID-19 may be diagnosed by testing a sample to check for the virus. The most common tests are the PCR test and the antigen test. Tests may be done in the lab or at home. They include: Using a swab to take a sample of fluid from your nose. Testing a sample of saliva from your mouth. Testing a sample of mucus from your lungs (sputum). How is this treated? Treatment for COVID-19 depends on how severe your condition is. Mild symptoms can be treated at home. You should rest, drink fluids, and take over-the-counter medicine. If you have symptoms and risk factors, you may be prescribed a medicine that fights viruses (antiviral). Severe symptoms may be treated in a hospital intensive care unit (ICU). Treatment may include: Extra oxygen given through a tube in the nose, a face mask, or a hood. Medicines. These may include: Antivirals, such as remdesivir. Anti-inflammatories, such as corticosteroids. These help reduce inflammation. Antithrombotics. These help prevent or treat blood clots. Convalescent plasma. This helps boost your immune system. Prone positioning. This is when you are laid on your stomach to help oxygen get into your lungs. Infection control measures. If you are at risk for a more serious illness, your health care provider may prescribe two medicines to help your immune system protect you. These are called long-acting monoclonal antibodies. They are given together  every 6 months. How is this prevented? To protect yourself: Get the vaccine or vaccine series if you meet the guidelines. You can even get the vaccine while you are pregnant or making breast milk (lactating). Get an added dose of the vaccine if you are immunocompromised. This  applies if you have had an organ transplant or if you have a condition that affects your immune system. You should get the added dose 4 weeks after you got the first one. If you get an mRNA vaccine, you will need to get 3 doses. Talk to your provider about getting experimental monoclonal antibodies. This treatment can help prevent severe illness. It may be given to you if: You are immunocompromised. You cannot get the vaccine. You may not get the vaccine if you have a severe allergic reaction to it or to what it is made of. You are not fully vaccinated. You are in a place where there is COVID-19 and: You are in close contact with someone who has COVID-19. You are at high risk of being exposed. You are at risk of illness from new variants of the virus. To protect others: If you have symptoms of COVID-19, take steps to stop the virus from spreading. Stay home. Leave your house only to get medical care. Do not use public transit. Do not travel while you are sick. Wash your hands often with soap and water for at least 20 seconds. If soap and water are not available, use alcohol-based hand sanitizer. Make sure that all people in your household wash their hands well and often. Cough or sneeze into a tissue or your sleeve or elbow. Do not cough or sneeze into your hand or into the air. Where to find more information Centers for Disease Control and Prevention (CDC): [employer]cy World Health Organization [facility]): http://curry.org/ Get help right away if: You have trouble breathing. You have pain or pressure in your chest. You are confused. Your lips or fingernails turn blue. You have trouble waking from sleep. Your symptoms get worse. These symptoms may be an emergency. Get help right away. Call 911. Do not wait to see if the symptoms will go away. Do not drive yourself to the hospital. This information is not intended to replace advice given to you by your health care provider. Make sure you discuss any  questions you have with your health care provider. Document Revised: 04/22/2022 Document Reviewed: 04/22/2022 Elsevier Patient Education  [place].

## 2022-10-20 NOTE — Progress Notes (Signed)
Kathleen Solis 57 y.o.   Chief Complaint  Patient presents with   Acute Visit    Poss sinus infection, body aches, chill, headache    HISTORY OF PRESENT ILLNESS: This is a 57 y.o. female complaining of flulike symptoms that started about 4 weeks ago.  Was seen earlier this month and started on Augmentin 875 mg which she took twice a day for 10 days.  No significant difference.  Still having some sinus headache, dry cough and bodyaches. Able to eat and drink.  Denies nausea or vomiting.  Denies abdominal pain or diarrhea.  Denies difficulty breathing or chest pain. No other associated symptoms. No other complaints or medical concerns today.  HPI   Prior to Admission medications   Medication Sig Start Date End Date Taking? Authorizing Provider  acetaminophen (TYLENOL) 500 MG tablet Take 500 mg by mouth every 6 (six) hours as needed.   Yes [provider]  albuterol (VENTOLIN HFA) 108 (90 Base) MCG/ACT inhaler Inhale 2 puffs into the lungs every 6 (six) hours as needed for wheezing or shortness of breath. 06/13/22  Yes Collene Gobble, MD  aspirin EC 81 MG tablet Take 1 tablet (81 mg total) by mouth in the morning. Swallow whole. 08/30/21  Yes Collene Gobble, MD  atorvastatin (LIPITOR) 10 MG tablet TAKE ONE TABLET BY MOUTH AT BEDTIME 05/04/22  Yes Burns, Claudina Lick, MD  colchicine 0.6 MG tablet Take 1 tablet (0.6 mg total) by mouth every 6 (six) hours as needed (gout pain). 05/17/22  Yes Laurey Morale, MD  fluticasone (FLONASE) 50 MCG/ACT nasal spray Place 2 sprays into both nostrils daily. 06/13/22  Yes Collene Gobble, MD  hydrochlorothiazide (HYDRODIURIL) 25 MG tablet TAKE ONE TABLET BY MOUTH DAILY 08/01/22  Yes Burns, Claudina Lick, MD  metoprolol succinate (TOPROL-XL) 100 MG 24 hr tablet TAKE ONE TABLET BY MOUTH AT BEDTIME 05/04/22  Yes Burns, Claudina Lick, MD  promethazine-dextromethorphan (PROMETHAZINE-DM) 6.25-15 MG/5ML syrup Take 5 mLs by mouth 4 (four) times daily as needed for cough.  10/17/22  Yes Collene Gobble, MD    Allergies  Allergen Reactions   Benzonatate Other (See Comments)    Headaches   Darvon [Propoxyphene] Other (See Comments)    Gi problems   Ibuprofen     tinnitus   Morphine And Related Itching   Prednisone Other (See Comments)    Make her head hurt   Sulfa Antibiotics Hives   Tussionex Pennkinetic Er [Hydrocod Poli-Chlorphe Poli Er] Other (See Comments)    Nausea, vomiting   Buprenorphine Hcl Itching   Ivp Dye [Iodinated Contrast Media] Nausea And Vomiting   Prochlorperazine Edisylate Itching and Other (See Comments)    "Acts weird: Hallucinations"    Patient Active Problem List   Diagnosis Date Noted   History of gout 06/23/2022   Obstructive sleep apnea 08/06/2021   Urinary hesitancy 12/31/2020   MCI (mild cognitive impairment) with memory loss - Dr Tomi Likens 12/31/2020   Midline cystocele    Mild asthma - Dr Lamonte Sakai 08/27/2020   Chronic cough 03/17/2019   Lumbar radiculopathy 05/15/2018   Vitamin B12 deficiency 02/23/2018   Onychomycosis 02/21/2018   Hyperlipidemia 02/14/2017   Hyperglycemia 02/14/2017   Oropharyngeal dysphagia 08/16/2016   Allergic rhinitis 08/16/2016   Carotid arterial disease 04/10/2016   Diverticulitis of colon without hemorrhage 02/25/2016   Bone spur of right foot 02/08/2016   History of cerebrovascular accident (CVA) with residual deficit    Benign essential hypertension 03/03/2015  Cyst of left ovary 03/03/2015    Past Medical History:  Diagnosis Date   Allergic rhinitis 08/16/2016   Benign essential hypertension 03/03/2015   Bone spur of right foot 02/08/2016   Bunion of left foot 02/08/2016   Carotid arterial disease 04/10/2016   03/2016: right 40-59%, left < 40%; 10/2020 carotid US: No evidence of B ICA stenosis   Chest pain 02/14/2017   Cyst of left ovary 03/03/2015   Diverticulitis of colon without hemorrhage 02/25/2016   Epigastric pain 05/06/2020   Episodic tension-type headache 01/12/2016    Taking motrin 200 mg bid   GERD (gastroesophageal reflux disease)    History of cerebrovascular accident (CVA) with residual deficit 2001   Continue aspirin 81 mg daily, statin and current blood pressure medications; Blood pressure has been well controlled and cholesterol has been at goal   Hyperglycemia 02/14/2017   Chronic; encouraged low sugar/carbohydrate diet and regular exercise   Hyperlipidemia    diet controlled - no med   Leg neuralgia, right    Related to CVA    Lumbar back pain 05/10/2018   Lumbar radiculopathy 05/15/2018   Pain seems consistent with radiculopathy   MCI (mild cognitive impairment) with memory loss 12/31/2020   Midline cystocele    Mild asthma 08/27/2020   no inhaler   Onychomycosis 02/21/2018   Toenails; Ciclopirox prescribed   Sciatica    right   Sinus infection 03/22/2019   Sleep apnea    recently dx, appt on 09/15/21 with MD to discuss results/CPAP   Stroke [facility])    Mini strokes per patient   SVD (spontaneous vaginal delivery)    x 2   Tinnitus of right ear 11/16/2016   no current problem as of 08/26/21 per patient   Vitamin B12 deficiency 02/23/2018    Past Surgical History:  Procedure Laterality Date   ABDOMINAL HYSTERECTOMY     partial   ANAL FISSURE REPAIR     APPENDECTOMY     BRONCHIAL WASHINGS  08/30/2021   Procedure: BRONCHIAL WASHINGS;  Surgeon: Collene Gobble, MD;  Location: [facility] ENDOSCOPY;  Service: Cardiopulmonary;;   CHOLECYSTECTOMY     CYSTOSCOPY N/A 10/04/2017   Procedure: Consuela Mimes;  Surgeon: Christophe Louis, MD;  Location: [place] [facility];  Service: Gynecology;  Laterality: N/A;   FOOT SURGERY Left    big toe bunion removed   LAPAROSCOPIC BILATERAL SALPINGO OOPHERECTOMY Bilateral 10/04/2017   Procedure: LAPAROSCOPIC BILATERAL SALPINGO OOPHORECTOMY, PELVIC WASHINGS;  Surgeon: Christophe Louis, MD;  Location: [place] [facility];  Service: Gynecology;  Laterality: Bilateral;   SACRAL COLPOPEXY ROBOTIC, posterior repair, cystoscopy  07/04/2019   @ [facility]   TUBAL  LIGATION     VIDEO BRONCHOSCOPY N/A 08/30/2021   Procedure: VIDEO BRONCHOSCOPY WITHOUT FLUORO;  Surgeon: Collene Gobble, MD;  Location: [place];  Service: Cardiopulmonary;  Laterality: N/A;   WISDOM TOOTH EXTRACTION      Social History   Socioeconomic History   Marital status: Single    Spouse name: Not on file   Number of children: 2   Years of education: 14   Highest education level: Associate degree: academic program  Occupational History   Occupation: Disability  Tobacco Use   Smoking status: Never   Smokeless tobacco: Never  Vaping Use   Vaping Use: Never used  Substance and Sexual Activity   Alcohol use: No    Alcohol/week: 0.0 standard drinks of alcohol   Drug use: No   Sexual activity: Not on file    Comment: Hysterectomy  Other Topics Concern   Not on file  Social History Narrative   No regular exercise      Pt is right handed, she occasionally drinks tea, walks QOD. She lives with her 57 yr old son, he has mild cerebral palsy.   Social Determinants of Health   Financial Resource Strain: Low Risk  (05/12/2022)   Overall Financial Resource Strain (CARDIA)    Difficulty of Paying Living Expenses: Not hard at all  Food Insecurity: No Food Insecurity (05/12/2022)   Hunger Vital Sign    Worried About Running Out of Food in the Last Year: Never true    Ran Out of Food in the Last Year: Never true  Transportation Needs: No Transportation Needs (05/12/2022)   PRAPARE - Hydrologist (Medical): No    Lack of Transportation (Non-Medical): No  Physical Activity: Sufficiently Active (05/12/2022)   Exercise Vital Sign    Days of Exercise per Week: 5 days    Minutes of Exercise per Session: 30 min  Recent Concern: Physical Activity - Inactive (05/12/2022)   Exercise Vital Sign    Days of Exercise per Week: 0 days    Minutes of Exercise per Session: 0 min  Stress: No Stress Concern Present (05/12/2022)   [place]    Feeling of Stress : Not at all  Social Connections: Moderately Integrated (05/12/2022)   Social Connection and Isolation Panel [NHANES]    Frequency of Communication with Friends and Family: More than three times a week    Frequency of Social Gatherings with Friends and Family: More than three times a week    Attends Religious Services: More than 4 times per year    Active Member of [employer] or Organizations: Yes    Attends Archivist Meetings: More than 4 times per year    Marital Status: Never married  Intimate Partner Violence: Not At Risk (05/12/2022)   Humiliation, Afraid, Rape, and Kick questionnaire    Fear of Current or Ex-Partner: No    Emotionally Abused: No    Physically Abused: No    Sexually Abused: No    Family History  Problem Relation Age of Onset   Breast cancer Sister    Diabetes Sister    Glaucoma Sister    Cataracts Sister    Hypertension Sister    Anuerysm Mother 45   Pancreatic cancer Father    Retinal detachment Brother    Cataracts Sister    Breast cancer Cousin    Anuerysm Cousin    Breast cancer Niece    Esophageal cancer Nephew    Developmental delay Nephew    Cerebral palsy Son        mild   Colon polyps Neg Hx    Prostate cancer Neg Hx    Rectal cancer Neg Hx    Stomach cancer Neg Hx    Uterine cancer Neg Hx      Review of Systems  Constitutional:  Positive for chills. Negative for fever.  HENT:  Positive for congestion and sore throat. Negative for ear discharge and ear pain.   Respiratory:  Positive for cough. Negative for sputum production, shortness of breath and wheezing.   Cardiovascular: Negative.  Negative for chest pain and palpitations.  Gastrointestinal:  Negative for abdominal pain, constipation, diarrhea, nausea and vomiting.  Musculoskeletal:  Positive for myalgias.  Skin: Negative.  Negative for rash.  Neurological:  Positive for headaches.  All other systems reviewed  and are negative.  Today's Vitals   10/20/22 1306  BP: 130/82  Pulse: 77  Temp: 99.2 F (37.3 C)  TempSrc: Oral  SpO2: 96%  Weight: 188 lb (85.3 kg)  Height: 5' 5.5" (1.664 m)   Body mass index is 30.81 kg/m.   Physical Exam Vitals reviewed.  Constitutional:      Appearance: Normal appearance.  HENT:     Head: Normocephalic.     Right Ear: Tympanic membrane, ear canal and external ear normal.     Left Ear: Tympanic membrane, ear canal and external ear normal.     Mouth/Throat:     Mouth: Mucous membranes are moist.     Pharynx: Oropharynx is clear. Posterior oropharyngeal erythema present. No oropharyngeal exudate.  Eyes:     Extraocular Movements: Extraocular movements intact.  Cardiovascular:     Rate and Rhythm: Normal rate and regular rhythm.     Pulses: Normal pulses.     Heart sounds: Normal heart sounds.  Pulmonary:     Effort: Pulmonary effort is normal.     Breath sounds: Normal breath sounds.  Musculoskeletal:     Cervical back: No tenderness.  Lymphadenopathy:     Cervical: No cervical adenopathy.  Skin:    General: Skin is warm and dry.  Neurological:     General: No focal deficit present.     Mental Status: She is alert and oriented to person, place, and time.  Psychiatric:        Mood and Affect: Mood normal.        Behavior: Behavior normal.    Results for orders placed or performed in visit on 10/20/22 (from the past 24 hour(s))  POCT Influenza A/B     Status: Normal   Collection Time: 10/20/22  3:35 PM  Result Value Ref Range   Influenza A, POC Negative Negative   Influenza B, POC Negative Negative  POC COVID-19     Status: Abnormal   Collection Time: 10/20/22  3:36 PM  Result Value Ref Range   SARS Coronavirus 2 Ag Positive (A) Negative  POCT rapid strep A     Status: Normal   Collection Time: 10/20/22  3:36 PM  Result Value Ref Range   Rapid Strep A Screen Negative Negative     ASSESSMENT & PLAN: A total of 42 minutes was spent  with the patient and counseling/coordination of care regarding preparing for this visit, review of most recent office visit notes, review of multiple chronic medical conditions under management, review of all medications, diagnosis of COVID infection and management, cough management, ED precautions, prognosis, documentation and need for follow-up if no better or worse during the next several days  Problem List Items Addressed This Visit       Respiratory   Lower respiratory infection    Secondary to viral respiratory infection.  Will benefit from daily azithromycin for 5 days.      Relevant Medications   azithromycin (ZITHROMAX) 250 MG tablet   Viral respiratory infection   Relevant Medications   azithromycin (ZITHROMAX) 250 MG tablet     Other   Persistent cough    Continue over-the-counter Mucinex DM and can also take Phenergan with codeine as needed      Relevant Medications   Promethazine-Codeine 6.25-10 MG/5ML SOLN   COVID-19 virus infection - Primary    Running its course without complications. Still very symptomatic. Recommend no work.  Stay home and rest. Advised to stay  well-hydrated and take cough medicine as needed. ED precautions given COVID instructions given      Relevant Medications   azithromycin (ZITHROMAX) 250 MG tablet   Flu-like symptoms    Advised to stay home and rest Advised to stay well-hydrated.      Relevant Orders   POCT Influenza A/B   POCT rapid strep A   POC COVID-19   Patient Instructions  COVID-19 COVID-19 is an infection caused by a virus called SARS-CoV-2. Most people who get COVID-19 have mild to moderate symptoms. Some have little to no symptoms. In others, the virus may cause a severe infection. What are the causes? COVID-19 is caused by a coronavirus. The virus may be in the air as droplets or as tiny specks of fluid (aerosols). It may also be on surfaces. You may catch the virus if you: Breathe in droplets when a person with  COVID-19 breathes, speaks, sings, coughs, or sneezes. Touch something that has the virus on it and then touch your mouth, nose, or eyes. What increases the risk? Risk for infection: You are more likely to get COVID-19 if: You are within 6 ft (1.8 m) of a person who has COVID-19 for 15 minutes or longer. You provide care to a person who has COVID-19. You are in close contact with others. This includes hugging, kissing, or sharing utensils. Risk for serious illness caused by COVID-19: You are more likely to get very ill from COVID-19 if: You have cancer. You have a long-term (chronic) disease. This may be: A chronic lung disease, such as pulmonary embolism, chronic obstructive pulmonary disease (COPD), or cystic fibrosis. A disease that affects your body's defense system (immune system). If you have a weak immune system, you are said to be immunocompromised. A serious heart condition, such as heart failure, coronary artery disease, or cardiomyopathy. Diabetes. Chronic kidney disease. A liver disease, such as cirrhosis, nonalcoholic fatty liver disease, alcoholic liver disease, or autoimmune hepatitis. You are obese. You are pregnant or were just pregnant. You have sickle cell disease. What are the signs or symptoms? Symptoms of COVID-19 can range from mild to severe. They may appear any time from 2 to 14 days after you are exposed. They include: Fever or chills. Shortness of breath or trouble breathing. Feeling tired. Headaches, body aches, or muscle aches. A runny or stuffy nose. Sneezing, coughing, or a sore throat. New loss of taste or smell. You may also have stomach problems, such as nausea, vomiting, or diarrhea. In some cases, you may not have any symptoms. How is this diagnosed? COVID-19 may be diagnosed by testing a sample to check for the virus. The most common tests are the PCR test and the antigen test. Tests may be done in the lab or at home. They include: Using a swab to  take a sample of fluid from your nose. Testing a sample of saliva from your mouth. Testing a sample of mucus from your lungs (sputum). How is this treated? Treatment for COVID-19 depends on how severe your condition is. Mild symptoms can be treated at home. You should rest, drink fluids, and take over-the-counter medicine. If you have symptoms and risk factors, you may be prescribed a medicine that fights viruses (antiviral). Severe symptoms may be treated in a hospital intensive care unit (ICU). Treatment may include: Extra oxygen given through a tube in the nose, a face mask, or a hood. Medicines. These may include: Antivirals, such as remdesivir. Anti-inflammatories, such as corticosteroids. These help reduce inflammation.  Antithrombotics. These help prevent or treat blood clots. Convalescent plasma. This helps boost your immune system. Prone positioning. This is when you are laid on your stomach to help oxygen get into your lungs. Infection control measures. If you are at risk for a more serious illness, your health care provider may prescribe two medicines to help your immune system protect you. These are called long-acting monoclonal antibodies. They are given together every 6 months. How is this prevented? To protect yourself: Get the vaccine or vaccine series if you meet the guidelines. You can even get the vaccine while you are pregnant or making breast milk (lactating). Get an added dose of the vaccine if you are immunocompromised. This applies if you have had an organ transplant or if you have a condition that affects your immune system. You should get the added dose 4 weeks after you got the first one. If you get an mRNA vaccine, you will need to get 3 doses. Talk to your provider about getting experimental monoclonal antibodies. This treatment can help prevent severe illness. It may be given to you if: You are immunocompromised. You cannot get the vaccine. You may not get the  vaccine if you have a severe allergic reaction to it or to what it is made of. You are not fully vaccinated. You are in a place where there is COVID-19 and: You are in close contact with someone who has COVID-19. You are at high risk of being exposed. You are at risk of illness from new variants of the virus. To protect others: If you have symptoms of COVID-19, take steps to stop the virus from spreading. Stay home. Leave your house only to get medical care. Do not use public transit. Do not travel while you are sick. Wash your hands often with soap and water for at least 20 seconds. If soap and water are not available, use alcohol-based hand sanitizer. Make sure that all people in your household wash their hands well and often. Cough or sneeze into a tissue or your sleeve or elbow. Do not cough or sneeze into your hand or into the air. Where to find more information Centers for Disease Control and Prevention (CDC): [employer].com.cy World Health Organization [facility]): http://curry.org/ Get help right away if: You have trouble breathing. You have pain or pressure in your chest. You are confused. Your lips or fingernails turn blue. You have trouble waking from sleep. Your symptoms get worse. These symptoms may be an emergency. Get help right away. Call 911. Do not wait to see if the symptoms will go away. Do not drive yourself to the hospital. This information is not intended to replace advice given to you by your health care provider. Make sure you discuss any questions you have with your health care provider. Document Revised: 04/22/2022 Document Reviewed: 04/22/2022 Elsevier Patient Education  [place], MD [facility]

## 2022-10-20 NOTE — Assessment & Plan Note (Signed)
Secondary to viral respiratory infection.  Will benefit from daily azithromycin for 5 days.

## 2022-10-20 NOTE — Assessment & Plan Note (Signed)
Advised to stay home and rest Advised to stay well-hydrated.

## 2022-10-20 NOTE — Assessment & Plan Note (Signed)
Running its course without complications. Still very symptomatic. Recommend no work.  Stay home and rest. Advised to stay well-hydrated and take cough medicine as needed. ED precautions given COVID instructions given

## 2022-10-27 ENCOUNTER — Telehealth: Payer: Self-pay | Admitting: Internal Medicine

## 2022-10-27 MED ORDER — FLUCONAZOLE 150 MG PO TABS: 150.0000 mg | ORAL_TABLET | Freq: Once | ORAL | 0 refills | Status: AC

## 2022-10-27 NOTE — Telephone Encounter (Signed)
Sent to pof 

## 2022-10-27 NOTE — Telephone Encounter (Signed)
Pt called wanted to know if she can get something for a yeast infection due to the antibiotic that was given to her previously.  Please call pt with update.  Nice CV:[id number] [address] [place], [place] [address] [place], [place] [address]

## 2022-10-27 NOTE — Telephone Encounter (Signed)
Called Pt and let her know.

## 2022-10-30 ENCOUNTER — Other Ambulatory Visit: Payer: Self-pay | Admitting: Internal Medicine

## 2022-10-30 DIAGNOSIS — E782 Mixed hyperlipidemia: Secondary | ICD-10-CM

## 2022-11-08 ENCOUNTER — Telehealth: Payer: Self-pay | Admitting: Internal Medicine

## 2022-11-08 NOTE — Telephone Encounter (Signed)
Patient called and said her stool is green.  She does not want to make an appointment - she just wants someone to call her back.  Number:  [phone]

## 2022-11-08 NOTE — Telephone Encounter (Signed)
Spoke with patient today. 

## 2022-11-22 DIAGNOSIS — M549 Dorsalgia, unspecified: Secondary | ICD-10-CM | POA: Diagnosis not present

## 2022-11-29 ENCOUNTER — Telehealth: Payer: Self-pay | Admitting: Emergency Medicine

## 2022-11-29 DIAGNOSIS — R053 Chronic cough: Secondary | ICD-10-CM

## 2022-11-29 MED ORDER — PROMETHAZINE-CODEINE 6.25-10 MG/5ML PO SOLN: 5.0000 mL | Freq: Four times a day (QID) | ORAL | 0 refills | Status: DC | PRN

## 2022-11-29 NOTE — Telephone Encounter (Signed)
Called and spoke with pt who states she needs a refill of her cough syrup. States due to the pollen, she has begun coughing again and she states the promethazine-DM is the only medication that help with her cough. Pt said she takes this at night so she can be able to get some sleep.  States occasionally she will take this during the day but mainly it is at night that she has to take this.  Dr. Delton Coombes, please advise on this for pt.

## 2022-11-29 NOTE — Telephone Encounter (Signed)
Refill sent.

## 2022-11-29 NOTE — Telephone Encounter (Signed)
Patient states needs refill for Promethazine cough syrup. [facility]. Patient phone number is [phone].

## 2022-11-29 NOTE — Telephone Encounter (Signed)
Patient called to inform the nurse that the wrong medication was sent in for her cough.  She wanted the promethazine-dextromethorphan (PROMETHAZINE-DM) 6.25-15 MG/5ML syrup and not the one with the codeine.  Please send the correct medication.  If there are any questions, please call at [phone]

## 2022-11-30 MED ORDER — PROMETHAZINE-DM 6.25-15 MG/5ML PO SYRP: 5.0000 mL | ORAL_SOLUTION | Freq: Four times a day (QID) | ORAL | 0 refills | Status: DC | PRN

## 2022-12-01 ENCOUNTER — Telehealth: Payer: Self-pay | Admitting: Internal Medicine

## 2022-12-01 NOTE — Telephone Encounter (Signed)
Form placed in Dr. Lawerance Bach folder to sign.

## 2022-12-01 NOTE — Telephone Encounter (Signed)
Patient dropped off document Handicap Placard, to be filled out by provider. Patient requested to send it via Call Patient to pick up within 7-days. Document is located in providers tray at front office.Please advise at Mobile [phone] (mobile) , Patient stated her current one expires at the end of the month so she is trying to have the application in before then.

## 2022-12-05 NOTE — Telephone Encounter (Signed)
Spoke with patient today and form left up front for pick up.

## 2022-12-15 DIAGNOSIS — R519 Headache, unspecified: Secondary | ICD-10-CM | POA: Diagnosis not present

## 2022-12-15 HISTORY — DX: Headache, unspecified: R51.9

## 2022-12-19 ENCOUNTER — Encounter: Payer: Self-pay | Admitting: Psychology

## 2022-12-19 DIAGNOSIS — R1012 Left upper quadrant pain: Secondary | ICD-10-CM | POA: Insufficient documentation

## 2022-12-19 DIAGNOSIS — R0781 Pleurodynia: Secondary | ICD-10-CM | POA: Insufficient documentation

## 2022-12-20 ENCOUNTER — Encounter: Payer: Self-pay | Admitting: Psychology

## 2022-12-20 ENCOUNTER — Ambulatory Visit: Payer: 59 | Admitting: Psychology

## 2022-12-20 ENCOUNTER — Ambulatory Visit: Payer: Medicare PPO

## 2022-12-20 DIAGNOSIS — G4733 Obstructive sleep apnea (adult) (pediatric): Secondary | ICD-10-CM

## 2022-12-20 DIAGNOSIS — G3184 Mild cognitive impairment, so stated: Secondary | ICD-10-CM

## 2022-12-20 DIAGNOSIS — Z8673 Personal history of transient ischemic attack (TIA), and cerebral infarction without residual deficits: Secondary | ICD-10-CM | POA: Diagnosis not present

## 2022-12-20 DIAGNOSIS — R519 Headache, unspecified: Secondary | ICD-10-CM | POA: Diagnosis not present

## 2022-12-20 DIAGNOSIS — I639 Cerebral infarction, unspecified: Secondary | ICD-10-CM

## 2022-12-20 DIAGNOSIS — R4189 Other symptoms and signs involving cognitive functions and awareness: Secondary | ICD-10-CM

## 2022-12-20 NOTE — Progress Notes (Signed)
   Psychometrician Note   Cognitive testing was administered to [name] by Shan Levans, B.S. (psychometrist) under the supervision of Dr. Newman Nickels, Ph.D., licensed psychologist on 12/20/2022. [name] did not appear overtly distressed by the testing session per behavioral observation or responses across self-report questionnaires. Rest breaks were offered.    The battery of tests administered was selected by Dr. Newman Nickels, Ph.D. with consideration to [name]'s current level of functioning, the nature of her symptoms, emotional and behavioral responses during interview, level of literacy, observed level of motivation/effort, and the nature of the referral question. This battery was communicated to the psychometrist. Communication between Dr. Newman Nickels, Ph.D. and the psychometrist was ongoing throughout the evaluation and Dr. Newman Nickels, Ph.D. was immediately accessible at all times. Dr. Newman Nickels, Ph.D. provided supervision to the psychometrist on the date of this service to the extent necessary to assure the quality of all services provided.    [name] will return within approximately 1-2 weeks for an interactive feedback session with Dr. Milbert Coulter at which time her test performances, clinical impressions, and treatment recommendations will be reviewed in detail. [name] understands she can contact our office should she require our assistance before this time.  A total of 150 minutes of billable time were spent face-to-face with [name] by the psychometrist. This includes both test administration and scoring time. Billing for these services is reflected in the clinical report generated by Dr. Newman Nickels, Ph.D.  This note reflects time spent with the psychometrician and does not include test scores or any clinical interpretations made by Dr. Milbert Coulter. The full report will follow in a separate note.

## 2022-12-20 NOTE — Progress Notes (Signed)
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 speech was fluent and word finding difficulties were not observed during the clinical interview. Thought processes were coherent, organized, and normal in content. Insight into her cognitive difficulties appeared adequate.   During testing, sustained attention was appropriate. Task engagement was adequate and she persisted when challenged. Overall, Ms. Hattabaugh was cooperative with the clinical interview and subsequent testing procedures.   Adequacy of Effort: The validity of neuropsychological testing is limited by the extent to which the individual being tested may be assumed to have exerted adequate effort during testing. Ms. Klimas expressed her intention to perform to the best of her abilities and exhibited adequate task engagement and persistence. Performances across stand-alone and embedded performance validity measures were variable, with all stand-alone measures scoring at-chance or otherwise below clinical cut-offs. As such, the results of the current evaluation should be interpreted with caution and should not be taken at face value as there remains the likelihood that obtained scores underestimate true  abilities to an unknown degree.  Test Results: Ms. Peaden was fully oriented at the time of the current evaluation.  Intellectual abilities based upon educational and vocational attainment were estimated to be in the average range. Premorbid abilities were estimated to be within the below average range based upon a single-word reading test.   Processing speed was largely below average. She did perform in the exceptionally low range across a rapid color naming task. Basic attention was average. More complex attention (e.g., working memory) was below average to average. Executive functioning was variable, ranging from the exceptionally low to average normative ranges.  Assessed receptive language abilities were average. Likewise, Ms. Knisely did not exhibit any difficulties comprehending task instructions and answered all questions asked of her appropriately. Assessed expressive language was variable, ranging from the well below average to well above average normative ranges.     Assessed visuospatial/visuoconstructional abilities were variable, ranging from the well below average to average normative ranges.    Learning (i.e., encoding) of novel verbal information was exceptionally low to well below average. Spontaneous delayed recall (i.e., retrieval) of previously learned information was exceptionally low across a list learning task but below average across story and figure tasks. Performance across recognition tasks was again exceptionally low across a list learning task but below average across story and figure tasks, suggesting  some evidence for information consolidation.  Simple motor speed was average bilaterally. Fine motor coordination and speed was below average bilaterally.   Results of emotional screening instruments suggested that recent symptoms of generalized anxiety were in the minimal range, while symptoms of depression were within normal limits. A screening instrument assessing  recent sleep quality suggested the presence of mild sleep dysfunction.  Tables of Scores:   Note: This summary of test scores accompanies the interpretive report and should not be considered in isolation without reference to the appropriate sections in the text. Descriptors are based on appropriate normative data and may be adjusted based on clinical judgment. Terms such as "Within Normal Limits" and "Outside Normal Limits" are used when a more specific description of the test score cannot be determined.       Percentile - Normative Descriptor > 98 - Exceptionally High 91-97 - Well Above Average 75-90 - Above Average 25-74 - Average 9-24 - Below Average 2-8 - Well Below Average < 2 - Exceptionally Low       Validity:   DESCRIPTOR       ACS Word Choice: --- --- Outside Normal Limits  Dot Counting Test: --- --- Outside Normal Limits  RBANS Effort Index: --- --- Within Normal Limits  WAIS-IV Reliable Digit Span: --- --- Within Normal Limits  D-KEFS Color Word Effort Index: --- --- Within Normal Limits       Orientation:      Raw Score Percentile   NAB Orientation, Form 1 29/29 --- ---       Cognitive Screening:      Raw Score Percentile   SLUMS: 18/30 --- ---       RBANS, Form A: Standard Score/ Scaled Score Percentile   Total Score 69 2 Well Below Average  Immediate Memory 57 <1 Exceptionally Low    List Learning 2 <1 Exceptionally Low    Story Memory 4 2 Well Below Average  Visuospatial/Constructional 87 19 Below Average    Figure Copy 11 63 Average    Line Orientation 10/20 3-9 Well Below Average  Language 82 12 Below Average    Picture Naming 10/10 51-75 Average    Semantic Fluency 4 2 Well Below Average  Attention 85 16 Below Average    Digit Span 8 25 Average    Coding 7 16 Below Average  Delayed Memory 68 2 Well Below Average    List Recall 1/10 <2 Exceptionally Low    List Recognition 15/20 <2 Exceptionally Low    Story Recall 6 9 Below Average    Story  Recognition 8/12 8-15 Below Average    Figure Recall 6 9 Below Average    Figure Recognition 4/8 9-20 Below Average        Intellectual Functioning:      Standard Score Percentile   Test of Premorbid Functioning: 81 10 Below Average       Attention/Executive Function:     Trail Making Test (TMT): Raw Score (T Score) Percentile     Part A 51 secs.,  0 errors (40) 16 Below Average    Part B 181 secs.,  5 errors (36) 8 Well Below Average         Scaled Score Percentile   WAIS-IV Digit Span: 7 16 Below Average    Forward 8 25 Average    Backward 8 25 Average    Sequencing 7 16 Below Average       D-KEFS Color-Word Interference Test: Raw Score (Scaled Score)  Percentile     Color Naming 47 secs. (3) 1 Exceptionally Low    Word Reading 28 secs. (7) 16 Below Average    Inhibition 82 secs. (6) 9 Below Average      Total Errors 0 errors (12) 75 Above Average    Inhibition/Switching 80 secs. (9) 37 Average      Total Errors 2 errors (10) 50 Average       D-KEFS Verbal Fluency Test: Raw Score (Scaled Score) Percentile     Letter Total Correct 23 (6) 9 Below Average    Category Total Correct 23 (4) 2 Well Below Average    Category Switching Total Correct 6 (2) <1 Exceptionally Low    Category Switching Accuracy 4 (2) <1 Exceptionally Low      Total Set Loss Errors 2 (10) 50 Average      Total Repetition Errors 2 (11) 63 Average       Language:     Verbal Fluency Test: Raw Score (T Score) Percentile     Phonemic Fluency (FAS) 23 (38) 12 Below Average    Animal Fluency 13 (40) 16 Below Average        NAB Language Module, Form 1: T Score Percentile     Oral Production 63 91 Well Above Average    Auditory Comprehension 56 73 Average    Naming 29/31 (42) 21 Below Average       Visuospatial/Visuoconstruction:      Raw Score Percentile   Clock Drawing: 10/10 --- Within Normal Limits        Scaled Score Percentile   WAIS-IV Block Design: 7 16 Below Average       Sensory-Motor:      [place] Grooved Pegboard Test: Raw Score Percentile     Dominant Hand 110 secs.,  3 drops  9 Below Average    Non-Dominant Hand 129 secs.,  1 drop 12 Below Average       Finger Tapping Test: Mean Percentile     Dominant Hand 41 34 Average    Non-Dominant Hand 38 42 Average       Mood and Personality:      Raw Score Percentile   Beck Depression Inventory - II: 3 --- Within Normal Limits  PROMIS Anxiety Questionnaire: 14 --- None to Slight       Additional Questionnaires:      Raw Score Percentile   PROMIS Sleep Disturbance Questionnaire: 25 --- Mild   Informed Consent and Coding/Compliance:   The current evaluation represents a clinical evaluation for the purposes previously outlined by the referral source and is in no way reflective of a forensic evaluation.   Ms. Moxley was provided with a verbal description of the nature and purpose of the present neuropsychological evaluation. Also reviewed were the foreseeable risks and/or discomforts and benefits of the procedure, limits of confidentiality, and mandatory reporting requirements of this provider. The patient was given the opportunity to ask questions and receive answers about the evaluation. Oral consent to participate was provided by the patient.   This evaluation was conducted by Newman Nickels, Ph.D., ABPP-CN, board certified clinical neuropsychologist. Ms. Broyles completed a clinical interview with Dr. Milbert Coulter, billed as one unit [phone], and 150 minutes of cognitive testing and scoring, billed as one unit [phone] and four additional units [id number]. Psychometrist Shan Levans, B.S., assisted Dr. Milbert Coulter with test administration and scoring procedures. As a separate and discrete service, one unit [id number] and two units [phone] were billed for Dr. Tammy Sours  time spent in interpretation and report writing.

## 2022-12-22 ENCOUNTER — Encounter: Payer: Medicare Other | Admitting: Internal Medicine

## 2022-12-27 ENCOUNTER — Ambulatory Visit (INDEPENDENT_AMBULATORY_CARE_PROVIDER_SITE_OTHER): Payer: 59 | Admitting: Psychology

## 2022-12-27 DIAGNOSIS — I639 Cerebral infarction, unspecified: Secondary | ICD-10-CM

## 2022-12-27 DIAGNOSIS — G3184 Mild cognitive impairment, so stated: Secondary | ICD-10-CM

## 2022-12-27 NOTE — Progress Notes (Unsigned)
Subjective:    Patient ID: Kathleen Solis, female    DOB: 1966/02/18, 57 y.o.   MRN: [id number]      HPI Mccartney is here for a Physical exam and her chronic medical problems.    Still coughing no change.  Medications and allergies reviewed with patient and updated if appropriate.  Current Outpatient Medications on File Prior to Visit  Medication Sig Dispense Refill   acetaminophen (TYLENOL) 500 MG tablet Take 500 mg by mouth every 6 (six) hours as needed.     albuterol (VENTOLIN HFA) 108 (90 Base) MCG/ACT inhaler Inhale 2 puffs into the lungs every 6 (six) hours as needed for wheezing or shortness of breath. 8 g 6   aspirin EC 81 MG tablet Take 1 tablet (81 mg total) by mouth in the morning. Swallow whole.     colchicine 0.6 MG tablet Take 1 tablet (0.6 mg total) by mouth every 6 (six) hours as needed (gout pain). 60 tablet 0   hydrochlorothiazide (HYDRODIURIL) 25 MG tablet TAKE ONE TABLET BY MOUTH DAILY 90 tablet 2   metoprolol succinate (TOPROL-XL) 100 MG 24 hr tablet TAKE 1 TABLET BY MOUTH AT BEDTIME 90 tablet 1   Promethazine-Codeine 6.25-10 MG/5ML SOLN Take 5 mLs by mouth every 6 (six) hours as needed. 240 mL 0   promethazine-dextromethorphan (PROMETHAZINE-DM) 6.25-15 MG/5ML syrup Take 5 mLs by mouth 4 (four) times daily as needed for cough. 240 mL 0   No current facility-administered medications on file prior to visit.    Review of Systems  Constitutional:  Negative for fever.  HENT:  Positive for postnasal drip and sinus pressure.   Eyes:  Negative for visual disturbance.  Respiratory:  Positive for cough. Negative for shortness of breath and wheezing.   Cardiovascular:  Positive for palpitations. Negative for chest pain and leg swelling.  Gastrointestinal:  Negative for abdominal pain, blood in stool, constipation and diarrhea.       No gerd  Genitourinary:  Negative for dysuria.  Musculoskeletal:  Positive for back pain (same). Negative for arthralgias.  Skin:   Negative for rash.  Neurological:  Positive for headaches. Negative for light-headedness.  Psychiatric/Behavioral:  Negative for dysphoric mood. The patient is not nervous/anxious.        Objective:   Vitals:   12/28/22 0911  BP: 130/74  Pulse: 62  Temp: 98.6 F (37 C)  SpO2: 96%   Filed Weights   12/28/22 0911  Weight: 188 lb (85.3 kg)   Body mass index is 30.81 kg/m.  BP Readings from Last 3 Encounters:  12/28/22 130/74  10/20/22 130/82  09/28/22 116/82    Wt Readings from Last 3 Encounters:  12/28/22 188 lb (85.3 kg)  10/20/22 188 lb (85.3 kg)  09/28/22 189 lb (85.7 kg)       Physical Exam Constitutional: She appears well-developed and well-nourished. No distress.  HENT:  Head: Normocephalic and atraumatic.  Right Ear: External ear normal. Normal ear canal and TM Left Ear: External ear normal.  Normal ear canal and TM Mouth/Throat: Oropharynx is clear and moist.  Eyes: Conjunctivae normal.  Neck: Neck supple. No tracheal deviation present. No thyromegaly present.  No carotid bruit  Cardiovascular: Normal rate, regular rhythm and normal heart sounds.   No murmur heard.  No edema. Pulmonary/Chest: Effort normal and breath sounds normal. No respiratory distress. She has no wheezes. She has no rales.  Breast: deferred   Abdominal: Soft. She exhibits no distension. There is no tenderness.  Lymphadenopathy: She has no cervical adenopathy.  Skin: Skin is warm and dry. She is not diaphoretic. Area of hair on posterior head Psychiatric: She has a normal mood and affect. Her behavior is normal.     Lab Results  Component Value Date   WBC 7.3 04/17/2022   HGB 13.8 04/17/2022   HCT 38.1 04/17/2022   PLT 203 04/17/2022   GLUCOSE 83 06/23/2022   CHOL 162 06/23/2022   TRIG 100.0 06/23/2022   HDL 64.90 06/23/2022   LDLCALC 77 06/23/2022   ALT 13 06/23/2022   AST 20 06/23/2022   NA 142 06/23/2022   K 3.5 06/23/2022   CL 99 06/23/2022   CREATININE 0.79  06/23/2022   BUN 14 06/23/2022   CO2 33 (H) 06/23/2022   TSH 2.02 12/21/2021   INR 1.18 11/24/2015   HGBA1C 5.0 06/23/2022         Assessment & Plan:   Physical exam: Screening blood work  ordered Exercise  walking a lot at work - will go to gym over the summer-stressed regular exercise Weight  encouraged weight loss Substance abuse  none   Reviewed recommended immunizations.   Health Maintenance  Topic Date Due   DTaP/Tdap/Td (1 - Tdap) Never done   Zoster Vaccines- Shingrix (1 of 2) Never done   COVID-19 Vaccine (3 - 2023-24 season) 01/13/2023 (Originally 04/22/2022)   INFLUENZA VACCINE  03/23/2023   Medicare Annual Wellness (AWV)  05/13/2023   MAMMOGRAM  09/07/2023   COLONOSCOPY (Pts 45-39yrs Insurance coverage will need to be confirmed)  04/06/2025   Hepatitis C Screening  Completed   HIV Screening  Completed   HPV VACCINES  Aged Out          See Problem List for Assessment and Plan of chronic medical problems.

## 2022-12-27 NOTE — Progress Notes (Signed)
   Neuropsychology Feedback Session Kathleen Solis. [facility] Department of Neurology  Reason for Referral:   Kathleen Solis is a 57 y.o. 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   Feedback:   Kathleen Solis completed a comprehensive neuropsychological evaluation on 12/20/2022. Please refer to that encounter for the full report and recommendations. Briefly, performances across stand-alone and embedded performance validity measures were variable, with all stand-alone measures scoring at-chance or otherwise below clinical cut-offs. As such, the results of the current evaluation should be interpreted with caution as there are underlying validity concerns. Broadly speaking, weaknesses across testing were exhibited across learning/memory, with some variability noted across processing speed, executive functioning, semantic fluency, and visuospatial abilities. However, as stated above, the true extent of impairment cannot be determined. Previous results in May 2022 (which did not demonstrate any validity concerns) were perhaps most consistent with a primary vascular etiology. Broadly speaking, testing patterns continue to be a reasonable finding in a primary vascular etiology. Difficulties would be worsened by frequent headaches and untreated sleep apnea.   Kathleen Solis was unaccompanied during the current feedback session. Content of the current session focused on the results of her neuropsychological evaluation. Kathleen Solis was given the opportunity to ask questions and her questions were answered. She was encouraged to reach out should additional questions arise. A copy of her report was provided at the  conclusion of the visit.      One unit [phone] was billed for Dr. Tammy Sours time spent preparing for, conducting, and documenting the current feedback session with Kathleen Solis.

## 2022-12-27 NOTE — Patient Instructions (Addendum)
Try  claritin during the day    Blood work was ordered.   The lab is on the first floor.    Medications changes include :   Increase atorvastatin to 20 mg daily    Return in about 6 months (around 06/30/2023) for follow up.    Health Maintenance, Female Adopting a healthy lifestyle and getting preventive care are important in promoting health and wellness. Ask your health care provider about: The right schedule for you to have regular tests and exams. Things you can do on your own to prevent diseases and keep yourself healthy. What should I know about diet, weight, and exercise? Eat a healthy diet  Eat a diet that includes plenty of vegetables, fruits, low-fat dairy products, and lean protein. Do not eat a lot of foods that are high in solid fats, added sugars, or sodium. Maintain a healthy weight Body mass index (BMI) is used to identify weight problems. It estimates body fat based on height and weight. Your health care provider can help determine your BMI and help you achieve or maintain a healthy weight. Get regular exercise Get regular exercise. This is one of the most important things you can do for your health. Most adults should: Exercise for at least 150 minutes each week. The exercise should increase your heart rate and make you sweat (moderate-intensity exercise). Do strengthening exercises at least twice a week. This is in addition to the moderate-intensity exercise. Spend less time sitting. Even light physical activity can be beneficial. Watch cholesterol and blood lipids Have your blood tested for lipids and cholesterol at 56 years of age, then have this test every 5 years. Have your cholesterol levels checked more often if: Your lipid or cholesterol levels are high. You are older than 56 years of age. You are at high risk for heart disease. What should I know about cancer screening? Depending on your health history and family history, you may need to have  cancer screening at various ages. This may include screening for: Breast cancer. Cervical cancer. Colorectal cancer. Skin cancer. Lung cancer. What should I know about heart disease, diabetes, and high blood pressure? Blood pressure and heart disease High blood pressure causes heart disease and increases the risk of stroke. This is more likely to develop in people who have high blood pressure readings or are overweight. Have your blood pressure checked: Every 3-5 years if you are 64-68 years of age. Every year if you are 72 years old or older. Diabetes Have regular diabetes screenings. This checks your fasting blood sugar level. Have the screening done: Once every three years after age 44 if you are at a normal weight and have a low risk for diabetes. More often and at a younger age if you are overweight or have a high risk for diabetes. What should I know about preventing infection? Hepatitis B If you have a higher risk for hepatitis B, you should be screened for this virus. Talk with your health care provider to find out if you are at risk for hepatitis B infection. Hepatitis C Testing is recommended for: Everyone born from [birth year] through 1964. Anyone with known risk factors for hepatitis C. Sexually transmitted infections (STIs) Get screened for STIs, including gonorrhea and chlamydia, if: You are sexually active and are younger than 56 years of age. You are older than 56 years of age and your health care provider tells you that you are at risk for this type of infection. Your  sexual activity has changed since you were last screened, and you are at increased risk for chlamydia or gonorrhea. Ask your health care provider if you are at risk. Ask your health care provider about whether you are at high risk for HIV. Your health care provider may recommend a prescription medicine to help prevent HIV infection. If you choose to take medicine to prevent HIV, you should first get tested for HIV.  You should then be tested every 3 months for as long as you are taking the medicine. Pregnancy If you are about to stop having your period (premenopausal) and you may become pregnant, seek counseling before you get pregnant. Take 400 to 800 micrograms (mcg) of folic acid every day if you become pregnant. Ask for birth control (contraception) if you want to prevent pregnancy. Osteoporosis and menopause Osteoporosis is a disease in which the bones lose minerals and strength with aging. This can result in bone fractures. If you are 64 years old or older, or if you are at risk for osteoporosis and fractures, ask your health care provider if you should: Be screened for bone loss. Take a calcium or vitamin D supplement to lower your risk of fractures. Be given hormone replacement therapy (HRT) to treat symptoms of menopause. Follow these instructions at home: Alcohol use Do not drink alcohol if: Your health care provider tells you not to drink. You are pregnant, may be pregnant, or are planning to become pregnant. If you drink alcohol: Limit how much you have to: 0-1 drink a day. Know how much alcohol is in your drink. In the [place] one drink equals one 12 oz bottle of beer (355 mL), one 5 oz glass of wine (148 mL), or one 1 oz glass of hard liquor (44 mL). Lifestyle Do not use any products that contain nicotine or tobacco. These products include cigarettes, chewing tobacco, and vaping devices, such as e-cigarettes. If you need help quitting, ask your health care provider. Do not use street drugs. Do not share needles. Ask your health care provider for help if you need support or information about quitting drugs. General instructions Schedule regular health, dental, and eye exams. Stay current with your vaccines. Tell your health care provider if: You often feel depressed. You have ever been abused or do not feel safe at home. Summary Adopting a healthy lifestyle and getting preventive care  are important in promoting health and wellness. Follow your health care provider's instructions about healthy diet, exercising, and getting tested or screened for diseases. Follow your health care provider's instructions on monitoring your cholesterol and blood pressure. This information is not intended to replace advice given to you by your health care provider. Make sure you discuss any questions you have with your health care provider. Document Revised: 12/28/2020 Document Reviewed: 12/28/2020 Elsevier Patient Education  2023 [employer].

## 2022-12-28 ENCOUNTER — Ambulatory Visit (INDEPENDENT_AMBULATORY_CARE_PROVIDER_SITE_OTHER): Payer: 59 | Admitting: Internal Medicine

## 2022-12-28 ENCOUNTER — Encounter: Payer: Self-pay | Admitting: Internal Medicine

## 2022-12-28 VITALS — BP 130/74 | HR 62 | Temp 98.6°F | Ht 65.5 in | Wt 188.0 lb

## 2022-12-28 DIAGNOSIS — Z8739 Personal history of other diseases of the musculoskeletal system and connective tissue: Secondary | ICD-10-CM | POA: Diagnosis not present

## 2022-12-28 DIAGNOSIS — Z0000 Encounter for general adult medical examination without abnormal findings: Secondary | ICD-10-CM

## 2022-12-28 DIAGNOSIS — L659 Nonscarring hair loss, unspecified: Secondary | ICD-10-CM | POA: Insufficient documentation

## 2022-12-28 DIAGNOSIS — J309 Allergic rhinitis, unspecified: Secondary | ICD-10-CM | POA: Diagnosis not present

## 2022-12-28 DIAGNOSIS — Z8673 Personal history of transient ischemic attack (TIA), and cerebral infarction without residual deficits: Secondary | ICD-10-CM

## 2022-12-28 DIAGNOSIS — E782 Mixed hyperlipidemia: Secondary | ICD-10-CM

## 2022-12-28 DIAGNOSIS — E538 Deficiency of other specified B group vitamins: Secondary | ICD-10-CM

## 2022-12-28 DIAGNOSIS — R739 Hyperglycemia, unspecified: Secondary | ICD-10-CM | POA: Diagnosis not present

## 2022-12-28 DIAGNOSIS — I10 Essential (primary) hypertension: Secondary | ICD-10-CM

## 2022-12-28 LAB — CBC WITH DIFFERENTIAL/PLATELET
Basophils Absolute: 0 10*3/uL (ref 0.0–0.1)
Basophils Relative: 0.7 % (ref 0.0–3.0)
Eosinophils Absolute: 0.1 10*3/uL (ref 0.0–0.7)
Eosinophils Relative: 1.3 % (ref 0.0–5.0)
HCT: 40.3 % (ref 36.0–46.0)
Hemoglobin: 14.3 g/dL (ref 12.0–15.0)
Lymphocytes Relative: 24.5 % (ref 12.0–46.0)
Lymphs Abs: 1.6 10*3/uL (ref 0.7–4.0)
MCHC: 35.4 g/dL (ref 30.0–36.0)
MCV: 92.7 fl (ref 78.0–100.0)
Monocytes Absolute: 0.5 10*3/uL (ref 0.1–1.0)
Monocytes Relative: 7.1 % (ref 3.0–12.0)
Neutro Abs: 4.3 10*3/uL (ref 1.4–7.7)
Neutrophils Relative %: 66.4 % (ref 43.0–77.0)
Platelets: 226 10*3/uL (ref 150.0–400.0)
RBC: 4.34 Mil/uL (ref 3.87–5.11)
RDW: 14 % (ref 11.5–15.5)
WBC: 6.5 10*3/uL (ref 4.0–10.5)

## 2022-12-28 LAB — COMPREHENSIVE METABOLIC PANEL
ALT: 13 U/L (ref 0–35)
AST: 20 U/L (ref 0–37)
Albumin: 4.3 g/dL (ref 3.5–5.2)
Alkaline Phosphatase: 85 U/L (ref 39–117)
BUN: 16 mg/dL (ref 6–23)
CO2: 33 mEq/L — ABNORMAL HIGH (ref 19–32)
Calcium: 9.9 mg/dL (ref 8.4–10.5)
Chloride: 97 mEq/L (ref 96–112)
Creatinine, Ser: 0.75 mg/dL (ref 0.40–1.20)
GFR: 89.24 mL/min (ref >60.00)
Glucose, Bld: 109 mg/dL — ABNORMAL HIGH (ref 70–99)
Potassium: 3.5 mEq/L (ref 3.5–5.1)
Sodium: 139 mEq/L (ref 135–145)
Total Bilirubin: 0.9 mg/dL (ref 0.2–1.2)
Total Protein: 7.7 g/dL (ref 6.0–8.3)

## 2022-12-28 LAB — LIPID PANEL
Cholesterol: 158 mg/dL (ref 0–200)
HDL: 66.8 mg/dL (ref >39.00)
LDL Cholesterol: 69 mg/dL (ref 0–99)
NonHDL: 91.08
Total CHOL/HDL Ratio: 2
Triglycerides: 111 mg/dL (ref 0.0–149.0)
VLDL: 22.2 mg/dL (ref 0.0–40.0)

## 2022-12-28 LAB — TSH: TSH: 1.26 u[IU]/mL (ref 0.35–5.50)

## 2022-12-28 LAB — HEMOGLOBIN A1C: Hgb A1c MFr Bld: 5 % (ref 4.6–6.5)

## 2022-12-28 LAB — VITAMIN B12: Vitamin B-12: 859 pg/mL (ref 211–911)

## 2022-12-28 MED ORDER — ATORVASTATIN CALCIUM 20 MG PO TABS
20.0000 mg | ORAL_TABLET | Freq: Every day | ORAL | 3 refills | Status: DC
Start: 2022-12-28 — End: 2024-07-08

## 2022-12-28 MED ORDER — FLUTICASONE PROPIONATE 50 MCG/ACT NA SUSP: 2.0000 | Freq: Every day | NASAL | 8 refills | Status: DC

## 2022-12-28 NOTE — Assessment & Plan Note (Signed)
Chronic Regular exercise and healthy diet encouraged LDL has not been at goal Check lipid panel today, CMP, TSH Increase atorvastatin to 20 mg daily

## 2022-12-28 NOTE — Assessment & Plan Note (Signed)
Chronic Had a flare a couple of months ago Has had 2 flares Will hold off on allopurinol for now unless she keeps getting flares Continue colchicine as needed

## 2022-12-28 NOTE — Assessment & Plan Note (Signed)
New The area of focal hair loss posterior upper head ?  Alopecia areata Deferred dermatology referral today She will monitor

## 2022-12-28 NOTE — Assessment & Plan Note (Signed)
Chronic Blood pressure well controlled CMP, CBC Continue HCTZ 25 mg daily, metoprolol XL 100 mg daily

## 2022-12-28 NOTE — Assessment & Plan Note (Signed)
Chronic Currently taking Flonase and that is helpful-continue-refilled Not currently taking it over-the-counter antihistamine-advised to restart Claritin 10 mg daily

## 2022-12-28 NOTE — Assessment & Plan Note (Addendum)
H/o CVA Continue ASA 81 mg daily, increase it to the statin to 20 mg daily  BP controlled Encouraged healthy diet, regular exercise

## 2022-12-28 NOTE — Assessment & Plan Note (Signed)
Chronic ?Check B12 level ?

## 2022-12-28 NOTE — Assessment & Plan Note (Signed)
Chronic Check a1c Low sugar / carb diet Stressed regular exercise  

## 2022-12-29 ENCOUNTER — Encounter: Payer: Self-pay | Admitting: Internal Medicine

## 2022-12-29 ENCOUNTER — Telehealth: Payer: Self-pay | Admitting: Internal Medicine

## 2022-12-29 NOTE — Telephone Encounter (Signed)
CO2 is one pont above normal - this is insignificant.  Not related to headaches.

## 2022-12-29 NOTE — Telephone Encounter (Signed)
Pt called wanted the nurse to give her a call so she can go over her test results because is having some concerns she like to discuss.

## 2022-12-30 NOTE — Telephone Encounter (Signed)
Spoke with patient today. 

## 2023-01-02 ENCOUNTER — Telehealth: Payer: Self-pay | Admitting: Emergency Medicine

## 2023-01-02 NOTE — Telephone Encounter (Signed)
Pt calling in bc she wants a refill for her PROMETHAZINE-DM  Pharmacy: [facility] on [address]

## 2023-01-05 ENCOUNTER — Encounter: Payer: Self-pay | Admitting: Emergency Medicine

## 2023-01-05 ENCOUNTER — Ambulatory Visit (INDEPENDENT_AMBULATORY_CARE_PROVIDER_SITE_OTHER): Payer: 59 | Admitting: Emergency Medicine

## 2023-01-05 VITALS — BP 120/68 | HR 64 | Temp 98.4°F | Ht 65.5 in | Wt 191.4 lb

## 2023-01-05 DIAGNOSIS — R053 Chronic cough: Secondary | ICD-10-CM

## 2023-01-05 DIAGNOSIS — R519 Headache, unspecified: Secondary | ICD-10-CM | POA: Diagnosis not present

## 2023-01-05 DIAGNOSIS — G8929 Other chronic pain: Secondary | ICD-10-CM

## 2023-01-05 MED ORDER — PROMETHAZINE-DM 6.25-15 MG/5ML PO SYRP: 5.0000 mL | ORAL_SOLUTION | Freq: Four times a day (QID) | ORAL | 0 refills | Status: DC | PRN

## 2023-01-05 NOTE — Patient Instructions (Signed)
We will arrange for a CT scan of the chest Please continue your fluticasone nasal spray and your loratadine as you have been taking them Please restart pantoprazole 40 mg once daily.  Take this medication 1 hour around food.  Stay on it until our next visit so that we can see if it has any impact We will refill your promethazine-DM cough syrup.  You can use this up to every 6 hours if needed for cough suppression Agree with neurology evaluation We discussed your sleep apnea and the benefits of CPAP.  This may help your headaches.  We should consider restarting.  We can discuss further after your neurology appointment Follow with Dr Delton Coombes in 1 month or next available

## 2023-01-05 NOTE — Progress Notes (Signed)
Subjective:    Patient ID: Kathleen Solis, female    DOB: 1966-06-15, 57 y.o.   MRN: [id number]  HPI  ROV 07/28/2022 --57 year old woman with a history of mild intermittent asthma.  She has allergic rhinitis and GERD which impact both.  She also has mild obstructive sleep apnea, not on CPAP.  She is undergoing bronchoscopy 08/2021.  She is on fluticasone nasal spray, loratadine, promethazine/DM cough syrup.  She has never benefited from GERD medication.  Still deals with chronic cough.  She underwent pulmonary function testing today as below. She reports that her cough is still happening daily. Non-productive. Voice can be labile. Rare albuterol use. She notes a mold exposure in her apartment - just noticed in her apartment. She has a sensitivity to mold on prior allergy testing.   Pulmonary function testing performed today and reviewed by me show normal airflows without a bronchodilator response, hyperinflated lung volumes based on RV, normal diffusion capacity.  The FEV1 is improved compared with 3 years ago.  Acute office visit 01/05/2023 --[name] is 57 with a history of chronic cough and upper airway irritation syndrome.  She has possible mild intermittent asthma as well based on clinical findings.  Her spirometry has been normal. Contributing factors include chronic rhinitis, GERD (has never benefited from PPI).  Bronchoscopy 08/30/2021 was reassuring but showed Prevotella (beta-lactamase positive).  She is on fluticasone nasal spray, uses albuterol approximately.  She has been on ICS/LABA in the past, unclear whether she got significant benefit.  She has used and benefits from Promethazine DM cough syrup. Also w OSA, not on CPAP.  Her HA's are progressive. Sinus eval was reassuring. She is planning to see Neurology about this. She is on flonase, just restarted loratadine.    Review of Systems  Constitutional:  Negative for fever and unexpected weight change.  HENT:  Negative for congestion,  dental problem, ear pain, nosebleeds, postnasal drip, rhinorrhea, sinus pressure, sneezing, sore throat and trouble swallowing.   Eyes:  Negative for redness and itching.  Respiratory:  Positive for cough. Negative for chest tightness, shortness of breath and wheezing.   Cardiovascular:  Negative for palpitations and leg swelling.  Gastrointestinal:  Negative for nausea and vomiting.  Genitourinary:  Negative for dysuria.  Musculoskeletal:  Negative for joint swelling.  Skin:  Negative for rash.  Neurological:  Negative for headaches.  Hematological:  Does not bruise/bleed easily.  Psychiatric/Behavioral:  Negative for dysphoric mood. The patient is not nervous/anxious.         Objective:   Physical Exam Vitals:   01/05/23 1049  BP: 120/68  Pulse: 64  Temp: 98.4 F (36.9 C)  TempSrc: Oral  SpO2: 96%  Weight: 191 lb 6.4 oz (86.8 kg)  Height: 5' 5.5" (1.664 m)   Gen: Pleasant, well-nourished, in no distress,  normal affect  ENT: No lesions,  mouth clear,  oropharynx clear, no postnasal drip, strong voice, no throat clearing  Neck: No JVD, no stridor  Lungs: No use of accessory muscles, no crackles or wheezing on normal respiration, no wheeze on forced expiration  Cardiovascular: RRR, heart sounds normal, no murmur or gallops, no peripheral edema  Musculoskeletal: No deformities, no cyanosis or clubbing  Neuro: alert, awake, non focal  Skin: Warm, no lesions or rash      Assessment & Plan:  Chronic cough Persistent dry cough that sounds most consistent with upper airway irritation syndrome.  She has reassuring spirometry but does carry diagnosis possible mild intermittent asthma as  well.  She is on fluticasone nasal spray, just added back loratadine.  I will empirically add back PPI although she has not really benefited from acid suppression in the past.  She had a bronchoscopy with normal anatomy just over a year ago.  I will perform a CT scan of the chest now to further  evaluate.  Will refill her promethazine-DM.  Chronic headaches Persistent headaches.  Her sinus evaluation has been reassuring.  She has seen neurology for memory loss but has not discussed the headaches with them yet.  She follows up with them on 6/3.  Did discuss the potential impact of her sleep apnea on headaches.  She is not using CPAP.  I think she probably needs to retry.  We can revisit after she sees neurology  Levy Pupa, MD, PhD 01/05/2023, 11:16 AM [facility] [phone] or if no answer [phone]

## 2023-01-05 NOTE — Assessment & Plan Note (Signed)
Persistent headaches.  Her sinus evaluation has been reassuring.  She has seen neurology for memory loss but has not discussed the headaches with them yet.  She follows up with them on 6/3.  Did discuss the potential impact of her sleep apnea on headaches.  She is not using CPAP.  I think she probably needs to retry.  We can revisit after she sees neurology

## 2023-01-05 NOTE — Assessment & Plan Note (Addendum)
Persistent dry cough that sounds most consistent with upper airway irritation syndrome.  She has reassuring spirometry but does carry diagnosis possible mild intermittent asthma as well.  She is on fluticasone nasal spray, just added back loratadine.  I will empirically add back PPI although she has not really benefited from acid suppression in the past.  She had a bronchoscopy with normal anatomy just over a year ago.  I will perform a CT scan of the chest now to further evaluate.  Will refill her promethazine-DM.

## 2023-01-05 NOTE — Telephone Encounter (Signed)
Spoke with the Kathleen Solis and and scheduled appt with Dr Delton Coombes today for eval of her cough Advised he can discuss tx at this visit  Nothing further needed

## 2023-01-19 NOTE — Progress Notes (Signed)
NEUROLOGY FOLLOW UP OFFICE NOTE  Kathleen Solis [id number]  Assessment/Plan:     Mild vascular neurocognitive disorder Chronic daily headaches History of left hemispheric stroke Obstructive sleep apnea     For headache - from my standpoint, I recommend starting a medication such as nortriptyline or a muscle relaxant.  She would like to avoid medications.  Therefore, I recommended treatment of her sleep apnea.  Also recommended routine exercise, physical therapy for neck, Biofeedback or cognitive behavioral therapy.  She will contact me if she would like a referral for another treatment modality. Secondary stroke prevention as managed by PCP:             - ASA 81mg  daily             - LDL goal less than 70             - Hgb A1c goal less than 7             - Normotensive blood pressure Weight loss to treat OSA Recommended repeat neuropsychological evaluation in 18-24 months Follow up if she would like to start a medication.       Subjective:  Kathleen Solis is a 57 year old right-handed woman with hypertension, hyperlipidemia, carotid artery disease, tension type headache and history of stroke who follows up for cognitive deficits.   UPDATE: Reports Underwent repeat neuropsychological evaluation on 4/30 again demonstrated findings consistent with mild neurocognitive disorder likely vascular with worsening difficulties related to headaches and untreated sleep apnea..     Current NSAIDS:  ASA 81mg  daily Current analgesics:  Takes Extra-Strength Tylenol for the headaches. Current triptans:  none Current ergotamine:  none Current anti-emetic:  none Current muscle relaxants:  none Current anti-anxiolytic:  none Current sleep aide:  none Current Antihypertensive medications:  Toprol XL; HCTZ Current Antidepressant medications:  none Current Anticonvulsant medications:  none Current anti-CGRP:  none Current Vitamins/Herbal/Supplements:  none Current  Antihistamines/Decongestants:  Flonase Other therapy:  rest  Sleep: Has OSA.  Cannot tolerate CPAP.  Currently trying to lose weight.       HISTORY: In 2001, she had a stroke while pregnant with her son.  She was told it was due to elevated blood pressure.  She presented with slurred speech and right leg weakness.  As a result, her son was born with cerebral palsy.  She has been followed by multiple neurologists.   Since the stroke, she has been on ASA 81mg  daily.  Non-contrast MRI brain report from 02/15/14 demonstrated minimal scattered T2 hyperintensities in the left periventricular white matter regions suggesting chronic microvascular degenerative disease but no acute infarcts, mass lesions or demyelinating disease.  This was also noted on prior MRI from 2012.   She presented to [facility] on 11/24/15 for acute onset right sided headache and numbness with trouble walking.  CT of head was personally reviewed and revealed no acute findings.  MRI of brain was ordered but she left AMA.  She followed up with neurologist, Dr. Stacy Gardner the following month.  MRI of brain with and without contrast performed on 01/19/16 again demonstrated patchy nonspecific cerebral white matter signal changes in left frontal lobe and periatrial white matter, possibly indicative of remote ischemic stroke.  Abnormal flow in the left cervical ICA noted as well.  Carotid doppler from 03/16/16 reportedly showed no hemodynamically significant stenosis.  A repeat MRI of the brain from 11/30/16 was personally reviewed to evaluate tinnitus in the right ear and again demonstrated  chronic white matter changes, left greater than right, in watershed distribution, but no acute findings.  Carotid ultrasound on 11/13/2020 showed no hemodynamically significant ICA stenosis.  Echocardiogram on 12/30/2020 showed EF 72% with no regional wall motion abnormalities or other abnormalities.     She was treated for episodic tension type  headaches as well.  The are moderate intensity non-throbbing headache on the top of her head.  They last 15 to 20 minutes and occur daily.  There are no associated symptoms such as nausea, photophobia, phonophobia or unilateral numbness or weakness.  She was always hesitant about starting a preventative.  She treated them with ibuprofen until she stopped because it was found to be the cause of her tinnitus.   She was previously on atorvastatin 40mg  but stopped due to leg pain.   She has been on disability since 2007.  She continues to have right leg weakness and trouble with speech.  She has memory deficits.  She is overall independent.  On one occasion, she got lost while driving on familiar route.  She usually uses a GPS.    Keeps losing her phone.  She is forgetting recent conversations.  B12 in March 2022 was 171.  She was advised to start daily.  MRI of brain without contrast on 11/28/2020 showed chronic left frontoparietal infarct but no acute intracranial abnormality.  She underwent neuropsychological evaluation in May 2022 showed evidence of mild neurocognitive disorder with variability across visuospatial abilities.  Etiology unclear - not completely convincing for vascular etiology but  at this time most likely given lack of alternatives (does not exhibit the profile of either Alzheimer's or Lewy Body dementia and does not exhibit evidence of demyelinating disease on MRI).       Since her stroke, she has insomnia.  She previously had a sleep study that demonstrated OSA.   Past medications:  gabapentin  PAST MEDICAL HISTORY: Past Medical History:  Diagnosis Date   Allergic rhinitis 08/16/2016   Benign essential hypertension 03/03/2015   Bone spur of right foot 02/08/2016   Bunion of left foot 02/08/2016   Carotid arterial disease 04/10/2016   03/2016: right 40-59%, left < 40%; 10/2020 carotid US: No evidence of B ICA stenosis   Cerebrovascular accident (CVA)    MRI - Chronic  hyperintensity left frontal parietal white matter unchanged likely due to chronic ischemia.   Chest pain 02/14/2017   Cyst of left ovary 03/03/2015   Diverticulitis of colon without hemorrhage 02/25/2016   Epigastric pain 05/06/2020   Episodic tension-type headache 01/12/2016   Taking motrin 200 mg bid   GERD (gastroesophageal reflux disease)    High-tone pelvic floor dysfunction 10/27/2021   History of COVID-19 05/18/2021   History of gout 06/23/2022   Hyperglycemia 02/14/2017   Chronic; encouraged low sugar/carbohydrate diet and regular exercise   Hyperlipidemia    diet controlled - no med   Left upper quadrant pain    Leg neuralgia, right    Related to CVA    Lower respiratory infection 07/18/2018   Lumbar back pain 05/10/2018   Lumbar radiculopathy 05/15/2018   Pain seems consistent with radiculopathy   MCI (mild cognitive impairment) with memory loss 12/31/2020   Midline cystocele    Mild asthma 08/27/2020   no inhaler   Obstructive sleep apnea 08/06/2021   Intolerant of cpap   Onychomycosis 02/21/2018   Toenails; Ciclopirox prescribed   Oropharyngeal dysphagia 08/16/2016   Persistent cough 03/17/2019   Primary stress urinary incontinence 10/27/2021  Rib pain    Sciatica    right   Sinus headache 12/15/2022   Sinus infection 03/22/2019   SVD (spontaneous vaginal delivery)    x 2   Tinnitus of right ear 11/16/2016   no current problem as of 08/26/21 per patient   Urinary hesitancy 12/31/2020   Viral respiratory infection 12/04/2019   Vitamin B12 deficiency 02/23/2018    MEDICATIONS: Current Outpatient Medications on File Prior to Visit  Medication Sig Dispense Refill   acetaminophen (TYLENOL) 500 MG tablet Take 500 mg by mouth every 6 (six) hours as needed.     albuterol (VENTOLIN HFA) 108 (90 Base) MCG/ACT inhaler Inhale 2 puffs into the lungs every 6 (six) hours as needed for wheezing or shortness of breath. 8 g 6   aspirin EC 81 MG tablet Take 1 tablet (81 mg  total) by mouth in the morning. Swallow whole.     atorvastatin (LIPITOR) 20 MG tablet Take 1 tablet (20 mg total) by mouth daily. 90 tablet 3   colchicine 0.6 MG tablet Take 1 tablet (0.6 mg total) by mouth every 6 (six) hours as needed (gout pain). 60 tablet 0   fluticasone (FLONASE) 50 MCG/ACT nasal spray Place 2 sprays into both nostrils daily. 16 g 8   hydrochlorothiazide (HYDRODIURIL) 25 MG tablet TAKE ONE TABLET BY MOUTH DAILY 90 tablet 2   metoprolol succinate (TOPROL-XL) 100 MG 24 hr tablet TAKE 1 TABLET BY MOUTH AT BEDTIME 90 tablet 1   Promethazine-Codeine 6.25-10 MG/5ML SOLN Take 5 mLs by mouth every 6 (six) hours as needed. (Patient not taking: Reported on 01/05/2023) 240 mL 0   promethazine-dextromethorphan (PROMETHAZINE-DM) 6.25-15 MG/5ML syrup Take 5 mLs by mouth 4 (four) times daily as needed for cough. 240 mL 0   No current facility-administered medications on file prior to visit.    ALLERGIES: Allergies  Allergen Reactions   Benzonatate Other (See Comments)    Headaches   Darvon [Propoxyphene] Other (See Comments)    Gi problems   Ibuprofen     tinnitus   Morphine And Codeine Itching   Prednisone Other (See Comments)    Make her head hurt   Sulfa Antibiotics Hives   Tussionex Pennkinetic Er [Hydrocod Poli-Chlorphe Poli Er] Other (See Comments)    Nausea, vomiting   Buprenorphine Hcl Itching   Ivp Dye [Iodinated Contrast Media] Nausea And Vomiting   Prochlorperazine Edisylate Itching and Other (See Comments)    "Acts weird: Hallucinations"    FAMILY HISTORY: Family History  Problem Relation Age of Onset   Breast cancer Sister    Diabetes Sister    Glaucoma Sister    Cataracts Sister    Hypertension Sister    Anuerysm Mother 59   Pancreatic cancer Father    Retinal detachment Brother    Cataracts Sister    Breast cancer Cousin    Anuerysm Cousin    Breast cancer Niece    Esophageal cancer Nephew    Developmental delay Nephew    Cerebral palsy Son         mild   Colon polyps Neg Hx    Prostate cancer Neg Hx    Rectal cancer Neg Hx    Stomach cancer Neg Hx    Uterine cancer Neg Hx       Objective:  Blood pressure 133/79, pulse 64, height 5\' 5"  (1.651 m), weight 193 lb (87.5 kg), SpO2 96 %. General: No acute distress.  Patient appears well-groomed.   Head:  Normocephalic/atraumatic  Eyes:  Fundi examined but not visualized Neck: supple, right paraspinal tenderness, full range of motion Heart:  Regular rate and rhythm Neurological Exam: alert and oriented.  Speech fluent and not dysarthric, language intact.  CN II-XII intact. Bulk and tone normal, muscle strength 5/5 throughout.  Sensation to light touch intact.  Deep tendon reflexes 2+ throughout.  Finger to nose testing intact.  Gait normal, Romberg negative.   Shon Millet, DO  CC: Cheryll Cockayne, MD

## 2023-01-23 ENCOUNTER — Encounter: Payer: Self-pay | Admitting: Neurology

## 2023-01-23 ENCOUNTER — Ambulatory Visit (INDEPENDENT_AMBULATORY_CARE_PROVIDER_SITE_OTHER): Payer: 59 | Admitting: Neurology

## 2023-01-23 VITALS — BP 133/79 | HR 64 | Ht 65.0 in | Wt 193.0 lb

## 2023-01-23 DIAGNOSIS — G3184 Mild cognitive impairment, so stated: Secondary | ICD-10-CM

## 2023-01-23 DIAGNOSIS — G4733 Obstructive sleep apnea (adult) (pediatric): Secondary | ICD-10-CM | POA: Diagnosis not present

## 2023-01-23 DIAGNOSIS — R519 Headache, unspecified: Secondary | ICD-10-CM | POA: Diagnosis not present

## 2023-01-23 NOTE — Patient Instructions (Signed)
Treatment for headaches: Medication (we often use antidepressants or muscle relaxants) Treatment of sleep apnea Consider physical therapy for neck pain, biofeedback or cognitive behavioral therapy I would definitely treat the sleep apnea.    Would recommend repeating memory testing in 18 to 24 months.

## 2023-01-31 ENCOUNTER — Ambulatory Visit (HOSPITAL_COMMUNITY)
Admission: RE | Admit: 2023-01-31 | Discharge: 2023-01-31 | Disposition: A | Discharge status: Home / Self Care | Payer: 59 | Source: Ambulatory Visit | Attending: Emergency Medicine | Admitting: Emergency Medicine

## 2023-01-31 DIAGNOSIS — R053 Chronic cough: Secondary | ICD-10-CM | POA: Insufficient documentation

## 2023-02-03 ENCOUNTER — Ambulatory Visit (INDEPENDENT_AMBULATORY_CARE_PROVIDER_SITE_OTHER): Payer: 59 | Admitting: Emergency Medicine

## 2023-02-03 ENCOUNTER — Encounter: Payer: Self-pay | Admitting: Emergency Medicine

## 2023-02-03 VITALS — BP 124/76 | HR 62 | Temp 97.9°F | Ht 65.5 in | Wt 192.4 lb

## 2023-02-03 DIAGNOSIS — R053 Chronic cough: Secondary | ICD-10-CM | POA: Diagnosis not present

## 2023-02-03 DIAGNOSIS — G4733 Obstructive sleep apnea (adult) (pediatric): Secondary | ICD-10-CM | POA: Diagnosis not present

## 2023-02-03 NOTE — Assessment & Plan Note (Signed)
These date back several years but have progressed in severity.  She is been on CPAP before and the headaches did not change.  She is willing to have a repeat sleep study, try to get back on CPAP to see if this benefits her.  I have encouraged her to do this.

## 2023-02-03 NOTE — Progress Notes (Signed)
Subjective:    Patient ID: Kathleen Solis, female    DOB: 20-Sep-1965, 57 y.o.   MRN: [id number]  HPI  ROV 02/03/23 --57 year old woman with a history of untreated OSA, upper airway irritation syndrome, chronic cough, possible mild mitten asthma with normal airflows on pulmonary function testing and hyperinflation on volumes.  She has underlying GERD, has not benefited from PPI in the past.  Bronchoscopy 08/2021 was reassuring but showed Prevotella (beta-lactamase positive).  Had been using fluticasone nasal spray.  Benefits from Promethazine DM cough syrup as well. She was having flaring cough when I saw her 1 month ago.  We added back loratadine to her fluticasone nasal spray, also tried empiric PPI again.  A CT chest was performed as below She reports today that her cough has not changed, was not impacted by restarting pantoprazole (she took for a week) or loratadine. Can happen at any time. Non-productive. She continues to have HA, saw Dr Everlena Cooper w Neurology. She has tried CPAP before but she continued to have HA even back then.   CT chest 01/31/2023 reviewed by me shows some subtle groundglass changes in the anterior medial left upper lobe. No significant mediastinal or hilar adenopathy final read is pending   Review of Systems  Constitutional:  Negative for fever and unexpected weight change.  HENT:  Negative for congestion, dental problem, ear pain, nosebleeds, postnasal drip, rhinorrhea, sinus pressure, sneezing, sore throat and trouble swallowing.   Eyes:  Negative for redness and itching.  Respiratory:  Positive for cough. Negative for chest tightness, shortness of breath and wheezing.   Cardiovascular:  Negative for palpitations and leg swelling.  Gastrointestinal:  Negative for nausea and vomiting.  Genitourinary:  Negative for dysuria.  Musculoskeletal:  Negative for joint swelling.  Skin:  Negative for rash.  Neurological:  Negative for headaches.  Hematological:  Does not  bruise/bleed easily.  Psychiatric/Behavioral:  Negative for dysphoric mood. The patient is not nervous/anxious.        Objective:   Physical Exam Vitals:   02/03/23 0827  BP: 124/76  Pulse: 62  Temp: 97.9 F (36.6 C)  TempSrc: Oral  SpO2: 98%  Weight: 192 lb 6.4 oz (87.3 kg)  Height: 5' 5.5" (1.664 m)   Gen: Pleasant, well-nourished, in no distress,  normal affect  ENT: No lesions,  mouth clear,  oropharynx clear, no postnasal drip, strong voice, no throat clearing  Neck: No JVD, no stridor  Lungs: No use of accessory muscles, no crackles or wheezing on normal respiration, no wheeze on forced expiration  Cardiovascular: RRR, heart sounds normal, no murmur or gallops, no peripheral edema  Musculoskeletal: No deformities, no cyanosis or clubbing  Neuro: alert, awake, non focal  Skin: Warm, no lesions or rash      Assessment & Plan:  Chronic cough Chronic cough with upper airway irritation syndrome.  She took pantoprazole for 1 week, then stopped it.  Remains on fluticasone nasal spray, loratadine.  She is using Promethazine DM nightly if needed.  Her CT chest showed some subtle patchy infiltrate in the anterior medial left upper lobe, unclear whether the this is significant or related to her symptoms.  She had a reassuring bronchoscopy in the past but was colonized with Prevotella.  We will plan to repeat her CT chest in 6 months, determine whether this infiltrate or any other evolving infiltrates are present.  If so she may merit repeat bronchoscopy, other workup.  Chronic headaches These date back several years but have  progressed in severity.  She is been on CPAP before and the headaches did not change.  She is willing to have a repeat sleep study, try to get back on CPAP to see if this benefits her.  I have encouraged her to do this.  Levy Pupa, MD, PhD 02/03/2023, 8:55 AM [facility] [phone] or if no answer [phone]

## 2023-02-03 NOTE — Assessment & Plan Note (Signed)
Chronic cough with upper airway irritation syndrome.  She took pantoprazole for 1 week, then stopped it.  Remains on fluticasone nasal spray, loratadine.  She is using Promethazine DM nightly if needed.  Her CT chest showed some subtle patchy infiltrate in the anterior medial left upper lobe, unclear whether the this is significant or related to her symptoms.  She had a reassuring bronchoscopy in the past but was colonized with Prevotella.  We will plan to repeat her CT chest in 6 months, determine whether this infiltrate or any other evolving infiltrates are present.  If so she may merit repeat bronchoscopy, other workup.

## 2023-02-03 NOTE — Patient Instructions (Addendum)
We will repeat your sleep study so we can consider restarting CPAP We will repeat your CT scan of the chest in December 2024 Please continue your fluticasone nasal spray and loratadine as you have been taking them Okay to stop pantoprazole Okay to use your Promethazine DM cough syrup 5 cc before bedtime for cough suppression Follow Dr. Delton Coombes in 3 months

## 2023-02-07 ENCOUNTER — Telehealth: Payer: Self-pay | Admitting: Emergency Medicine

## 2023-02-07 NOTE — Telephone Encounter (Signed)
promethazine-dextromethorphan (PROMETHAZINE-DM) 6.25-15 MG/5ML syrup [[id number]]  pt. Needs refills sent to pharmacy was just seen by Dr. Delton Coombes

## 2023-02-08 MED ORDER — PROMETHAZINE-DM 6.25-15 MG/5ML PO SYRP: 5.0000 mL | ORAL_SOLUTION | Freq: Four times a day (QID) | ORAL | 0 refills | Status: DC | PRN

## 2023-02-08 NOTE — Telephone Encounter (Signed)
ATC LVMTCB x 1  

## 2023-02-09 NOTE — Telephone Encounter (Signed)
Called pt. Back let her know the med got sent to pharmacy closing encounter

## 2023-02-10 ENCOUNTER — Ambulatory Visit (INDEPENDENT_AMBULATORY_CARE_PROVIDER_SITE_OTHER): Payer: 59 | Admitting: Family Medicine

## 2023-02-10 ENCOUNTER — Emergency Department (HOSPITAL_COMMUNITY): Payer: 59

## 2023-02-10 ENCOUNTER — Other Ambulatory Visit: Payer: Self-pay

## 2023-02-10 ENCOUNTER — Encounter (HOSPITAL_COMMUNITY): Payer: Self-pay

## 2023-02-10 ENCOUNTER — Emergency Department (HOSPITAL_COMMUNITY)
Admission: EM | Admit: 2023-02-10 | Discharge: 2023-02-10 | Disposition: A | Discharge status: Home / Self Care | Payer: 59 | Attending: Emergency Medicine | Admitting: Emergency Medicine

## 2023-02-10 ENCOUNTER — Encounter: Payer: Self-pay | Admitting: Family Medicine

## 2023-02-10 VITALS — BP 113/74 | HR 58 | Wt 194.0 lb

## 2023-02-10 DIAGNOSIS — R519 Headache, unspecified: Secondary | ICD-10-CM

## 2023-02-10 DIAGNOSIS — Z743 Need for continuous supervision: Secondary | ICD-10-CM | POA: Diagnosis not present

## 2023-02-10 DIAGNOSIS — R531 Weakness: Secondary | ICD-10-CM | POA: Diagnosis not present

## 2023-02-10 DIAGNOSIS — G8929 Other chronic pain: Secondary | ICD-10-CM | POA: Diagnosis not present

## 2023-02-10 DIAGNOSIS — Z7982 Long term (current) use of aspirin: Secondary | ICD-10-CM | POA: Diagnosis not present

## 2023-02-10 DIAGNOSIS — Z8673 Personal history of transient ischemic attack (TIA), and cerebral infarction without residual deficits: Secondary | ICD-10-CM | POA: Diagnosis not present

## 2023-02-10 DIAGNOSIS — I10 Essential (primary) hypertension: Secondary | ICD-10-CM | POA: Insufficient documentation

## 2023-02-10 DIAGNOSIS — I679 Cerebrovascular disease, unspecified: Secondary | ICD-10-CM

## 2023-02-10 DIAGNOSIS — Z79899 Other long term (current) drug therapy: Secondary | ICD-10-CM | POA: Diagnosis not present

## 2023-02-10 DIAGNOSIS — R200 Anesthesia of skin: Secondary | ICD-10-CM | POA: Diagnosis not present

## 2023-02-10 DIAGNOSIS — R202 Paresthesia of skin: Secondary | ICD-10-CM | POA: Insufficient documentation

## 2023-02-10 DIAGNOSIS — R29818 Other symptoms and signs involving the nervous system: Secondary | ICD-10-CM | POA: Diagnosis not present

## 2023-02-10 DIAGNOSIS — G4489 Other headache syndrome: Secondary | ICD-10-CM | POA: Diagnosis not present

## 2023-02-10 LAB — CBC WITH DIFFERENTIAL/PLATELET
Abs Immature Granulocytes: 0.03 10*3/uL (ref 0.00–0.07)
Basophils Absolute: 0.1 10*3/uL (ref 0.0–0.1)
Basophils Relative: 1 %
Eosinophils Absolute: 0.1 10*3/uL (ref 0.0–0.5)
Eosinophils Relative: 2 %
HCT: 38.4 % (ref 36.0–46.0)
Hemoglobin: 13.6 g/dL (ref 12.0–15.0)
Immature Granulocytes: 0 %
Lymphocytes Relative: 21 %
Lymphs Abs: 2 10*3/uL (ref 0.7–4.0)
MCH: 32.2 pg (ref 26.0–34.0)
MCHC: 35.4 g/dL (ref 30.0–36.0)
MCV: 91 fL (ref 80.0–100.0)
Monocytes Absolute: 0.6 10*3/uL (ref 0.1–1.0)
Monocytes Relative: 6 %
Neutro Abs: 6.8 10*3/uL (ref 1.7–7.7)
Neutrophils Relative %: 70 %
Platelets: 228 10*3/uL (ref 150–400)
RBC: 4.22 MIL/uL (ref 3.87–5.11)
RDW: 14.3 % (ref 11.5–15.5)
WBC: 9.6 10*3/uL (ref 4.0–10.5)
nRBC: 0 % (ref 0.0–0.2)

## 2023-02-10 LAB — COMPREHENSIVE METABOLIC PANEL
ALT: 22 U/L (ref 0–44)
AST: 27 U/L (ref 15–41)
Albumin: 3.9 g/dL (ref 3.5–5.0)
Alkaline Phosphatase: 88 U/L (ref 38–126)
Anion gap: 16 — ABNORMAL HIGH (ref 5–15)
BUN: 12 mg/dL (ref 6–20)
CO2: 26 mmol/L (ref 22–32)
Calcium: 9.6 mg/dL (ref 8.9–10.3)
Chloride: 100 mmol/L (ref 98–111)
Creatinine, Ser: 0.82 mg/dL (ref 0.44–1.00)
GFR, Estimated: >60 mL/min (ref >60)
Glucose, Bld: 90 mg/dL (ref 70–99)
Potassium: 3.3 mmol/L — ABNORMAL LOW (ref 3.5–5.1)
Sodium: 142 mmol/L (ref 135–145)
Total Bilirubin: 1.2 mg/dL (ref 0.3–1.2)
Total Protein: 7.2 g/dL (ref 6.5–8.1)

## 2023-02-10 LAB — URINALYSIS, ROUTINE W REFLEX MICROSCOPIC
Bilirubin Urine: NEGATIVE
Glucose, UA: NEGATIVE mg/dL
Hgb urine dipstick: NEGATIVE
Ketones, ur: NEGATIVE mg/dL
Leukocytes,Ua: NEGATIVE
Nitrite: NEGATIVE
Protein, ur: NEGATIVE mg/dL
Specific Gravity, Urine: 1.012 (ref 1.005–1.030)
pH: 7 (ref 5.0–8.0)

## 2023-02-10 LAB — CBG MONITORING, ED: Glucose-Capillary: 88 mg/dL (ref 70–99)

## 2023-02-10 MED ORDER — DIPHENHYDRAMINE HCL 50 MG/ML IJ SOLN: 12.5000 mg | Freq: Once | INTRAMUSCULAR | Status: DC

## 2023-02-10 MED ORDER — LORAZEPAM 2 MG/ML IJ SOLN: 0.5000 mg | Freq: Once | INTRAMUSCULAR | Status: DC

## 2023-02-10 MED ORDER — ACETAMINOPHEN 325 MG PO TABS
650.0000 mg | ORAL_TABLET | Freq: Once | ORAL | Status: AC
  Administered 2023-02-10: 650 mg via ORAL
  Filled 2023-02-10: qty 2

## 2023-02-10 MED ORDER — METOCLOPRAMIDE HCL 5 MG/ML IJ SOLN: 5.0000 mg | Freq: Once | INTRAMUSCULAR | Status: DC

## 2023-02-10 MED ORDER — ONDANSETRON HCL 4 MG/2ML IJ SOLN
4.0000 mg | Freq: Once | INTRAMUSCULAR | Status: AC
  Administered 2023-02-10: 4 mg via INTRAVENOUS
  Filled 2023-02-10: qty 2

## 2023-02-10 NOTE — Progress Notes (Signed)
OFFICE VISIT  02/10/2023  CC:  Chief Complaint  Patient presents with   Leg Pain    Left thigh extending down to the left foot. Experiencing numbness as well. Started noticing pain yesterday. Has hx of mini strokes.     Patient is a 57 y.o. female who presents for left leg numbness.  HPI: Onset yesterday, entire left leg feels numb and cold, hard to walk w/out limp.  Sx's wax and wane in intensity. No pain in back or leg at all. Has been having generalized headache for a couple months or so.  No n/v, no photophobia.  No hx of migraines with aura. Takes ASA every day, statin, and antihypertensives.  Hx of cva w/out residual deficit (L .    Past Medical History:  Diagnosis Date   Allergic rhinitis 08/16/2016   Benign essential hypertension 03/03/2015   Bone spur of right foot 02/08/2016   Bunion of left foot 02/08/2016   Carotid arterial disease 04/10/2016   03/2016: right 40-59%, left < 40%; 10/2020 carotid US: No evidence of B ICA stenosis   Cerebrovascular accident (CVA)    MRI - Chronic hyperintensity left frontal parietal white matter unchanged likely due to chronic ischemia.   Chest pain 02/14/2017   Cyst of left ovary 03/03/2015   Diverticulitis of colon without hemorrhage 02/25/2016   Epigastric pain 05/06/2020   Episodic tension-type headache 01/12/2016   Taking motrin 200 mg bid   GERD (gastroesophageal reflux disease)    High-tone pelvic floor dysfunction 10/27/2021   History of COVID-19 05/18/2021   History of gout 06/23/2022   Hyperglycemia 02/14/2017   Chronic; encouraged low sugar/carbohydrate diet and regular exercise   Hyperlipidemia    diet controlled - no med   Left upper quadrant pain    Leg neuralgia, right    Related to CVA    Lower respiratory infection 07/18/2018   Lumbar back pain 05/10/2018   Lumbar radiculopathy 05/15/2018   Pain seems consistent with radiculopathy   MCI (mild cognitive impairment) with memory loss 12/31/2020   Midline  cystocele    Mild asthma 08/27/2020   no inhaler   Obstructive sleep apnea 08/06/2021   Intolerant of cpap   Onychomycosis 02/21/2018   Toenails; Ciclopirox prescribed   Oropharyngeal dysphagia 08/16/2016   Persistent cough 03/17/2019   Primary stress urinary incontinence 10/27/2021   Rib pain    Sciatica    right   Sinus headache 12/15/2022   Sinus infection 03/22/2019   SVD (spontaneous vaginal delivery)    x 2   Tinnitus of right ear 11/16/2016   no current problem as of 08/26/21 per patient   Urinary hesitancy 12/31/2020   Viral respiratory infection 12/04/2019   Vitamin B12 deficiency 02/23/2018    Past Surgical History:  Procedure Laterality Date   ABDOMINAL HYSTERECTOMY     partial   ANAL FISSURE REPAIR     APPENDECTOMY     BRONCHIAL WASHINGS  08/30/2021   Procedure: BRONCHIAL WASHINGS;  Surgeon: Leslye Peer, MD;  Location: [facility] ENDOSCOPY;  Service: Cardiopulmonary;;   CHOLECYSTECTOMY     CYSTOSCOPY N/A 10/04/2017   Procedure: Bluford Kaufmann;  Surgeon: Gerald Leitz, MD;  Location: [facility] ORS;  Service: Gynecology;  Laterality: N/A;   FOOT SURGERY Left    big toe bunion removed   LAPAROSCOPIC BILATERAL SALPINGO OOPHERECTOMY Bilateral 10/04/2017   Procedure: LAPAROSCOPIC BILATERAL SALPINGO OOPHORECTOMY, PELVIC WASHINGS;  Surgeon: Gerald Leitz, MD;  Location: [facility] ORS;  Service: Gynecology;  Laterality: Bilateral;   SACRAL  COLPOPEXY ROBOTIC, posterior repair, cystoscopy  07/04/2019   @ [facility]   TUBAL LIGATION     VIDEO BRONCHOSCOPY N/A 08/30/2021   Procedure: VIDEO BRONCHOSCOPY WITHOUT FLUORO;  Surgeon: Leslye Peer, MD;  Location: [facility] ENDOSCOPY;  Service: Cardiopulmonary;  Laterality: N/A;   WISDOM TOOTH EXTRACTION      Outpatient Medications Prior to Visit  Medication Sig Dispense Refill   albuterol (VENTOLIN HFA) 108 (90 Base) MCG/ACT inhaler Inhale 2 puffs into the lungs every 6 (six) hours as needed for wheezing or shortness of breath. 8 g 6   aspirin EC 81 MG tablet Take 1  tablet (81 mg total) by mouth in the morning. Swallow whole.     atorvastatin (LIPITOR) 20 MG tablet Take 1 tablet (20 mg total) by mouth daily. 90 tablet 3   hydrochlorothiazide (HYDRODIURIL) 25 MG tablet TAKE ONE TABLET BY MOUTH DAILY 90 tablet 2   metoprolol succinate (TOPROL-XL) 100 MG 24 hr tablet TAKE 1 TABLET BY MOUTH AT BEDTIME 90 tablet 1   promethazine-dextromethorphan (PROMETHAZINE-DM) 6.25-15 MG/5ML syrup Take 5 mLs by mouth 4 (four) times daily as needed for cough. 240 mL 0   vitamin B-12 (CYANOCOBALAMIN) 50 MCG tablet Take 50 mcg by mouth daily.     acetaminophen (TYLENOL) 500 MG tablet Take 500 mg by mouth every 6 (six) hours as needed. (Patient not taking: Reported on 02/10/2023)     colchicine 0.6 MG tablet Take 1 tablet (0.6 mg total) by mouth every 6 (six) hours as needed (gout pain). (Patient not taking: Reported on 01/23/2023) 60 tablet 0   fluticasone (FLONASE) 50 MCG/ACT nasal spray Place 2 sprays into both nostrils daily. (Patient not taking: Reported on 02/10/2023) 16 g 8   No facility-administered medications prior to visit.    Allergies  Allergen Reactions   Benzonatate Other (See Comments)    Headaches   Darvon [Propoxyphene] Other (See Comments)    Gi problems   Ibuprofen     tinnitus   Morphine And Codeine Itching   Prednisone Other (See Comments)    Make her head hurt   Sulfa Antibiotics Hives   Tussionex Pennkinetic Er [Hydrocod Poli-Chlorphe Poli Er] Other (See Comments)    Nausea, vomiting   Buprenorphine Hcl Itching   Ivp Dye [Iodinated Contrast Media] Nausea And Vomiting   Prochlorperazine Edisylate Itching and Other (See Comments)    "Acts weird: Hallucinations"    Review of Systems  As per HPI  PE:    02/10/2023    4:06 PM 02/03/2023    8:27 AM 01/23/2023   10:10 AM  Vitals with BMI  Height  5' 5.5" 5\' 5"   Weight 194 lbs 192 lbs 6 oz 193 lbs  BMI 31.78 31.52 32.12  Systolic 113 124 161  Diastolic 74 76 79  Pulse 58 62 64   Physical  Exam  Gen: Alert, well appearing.  Patient is oriented to person, place, time, and situation. AFFECT: pleasant, lucid thought and speech. CV: RRR Neuro: CN 2-12 intact bilaterally although there is very slight ptosis on the left--> unsure if acute or chronic.  Strength 5/5 in proximal and distal upper extremities.  Right leg 5 out of 5 strength proximal and distal. Left leg 4 out of 5 strength proximal and distal.   Mild decreased fine touch sensation in left leg. No tremor.   Antalgic gait.  Upper extremity and lower extremity DTRs symmetric.  No pronator drift.  LABS:  Last CBC Lab Results  Component Value Date  WBC 6.5 12/28/2022   HGB 14.3 12/28/2022   HCT 40.3 12/28/2022   MCV 92.7 12/28/2022   MCH 32.4 04/17/2022   RDW 14.0 12/28/2022   PLT 226.0 12/28/2022   Last metabolic panel Lab Results  Component Value Date   GLUCOSE 109 (H) 12/28/2022   NA 139 12/28/2022   K 3.5 12/28/2022   CL 97 12/28/2022   CO2 33 (H) 12/28/2022   BUN 16 12/28/2022   CREATININE 0.75 12/28/2022   GFRNONAA >60 04/17/2022   CALCIUM 9.9 12/28/2022   PROT 7.7 12/28/2022   ALBUMIN 4.3 12/28/2022   BILITOT 0.9 12/28/2022   ALKPHOS 85 12/28/2022   AST 20 12/28/2022   ALT 13 12/28/2022   ANIONGAP 10 04/17/2022   Last hemoglobin A1c Lab Results  Component Value Date   HGBA1C 5.0 12/28/2022   IMPRESSION AND PLAN:  Acute left leg numbness and weakness (approx 24-36h). Daily headaches. Cerebrovascular disease --> remote history of CVA without residual deficit. Needs emergent head imaging. EMS contacted and will have patient transported to the emergency department.  Spent 45 min with pt today reviewing HPI, reviewing relevant past history, doing exam, reviewing and discussing lab and imaging data, and formulating plans.  An After Visit Summary was printed and given to the patient.  FOLLOW UP: No follow-ups on file.  Signed:  Santiago Bumpers, MD           02/10/2023

## 2023-02-10 NOTE — ED Notes (Signed)
Patient transported to MRI 

## 2023-02-10 NOTE — ED Notes (Signed)
Patient returned from MRI. Stretcher locked in lowest position, call bell in reach.

## 2023-02-10 NOTE — ED Notes (Signed)
Pt ambulated from EMS stretcher to bed with steady gait.

## 2023-02-10 NOTE — ED Triage Notes (Signed)
Pt BIBEMS from pcp. Pt states cc is left arm/leg numbness that started at 8pm last night and ha continued intermittently throughout today. Pt also complaining of ongoing HA and states she has significant hx for migraines and sees a neurologist for migraine treatment. Pt also has hx of TIA's, denies being on blood thinner. Denies and vision or speech changes.

## 2023-02-10 NOTE — ED Provider Notes (Signed)
[facility]GENC[facility] DEPARTMENT AT [facility] Provider Note   CSN: [id number] Arrival date & time: 02/10/23  1805     History  Chief Complaint  Patient presents with   Weakness    Kathleen Solis is a 57 y.o. female.  Patient here with headache, numbness to her left lower leg.  History of migraines but has not had issues like this before the headaches.  She has a history of high cholesterol, hypertension and she says many stroke/strokes in the past.  She says she started to notice numbness in the leg last night about 24 hours ago.  She has not had any weakness or speech changes or vision loss.  She has had a headache for the last few days.  Denies any chest pain or shortness of breath.  The history is provided by the patient.       Home Medications Prior to Admission medications   Medication Sig Start Date End Date Taking? Authorizing Provider  acetaminophen (TYLENOL) 500 MG tablet Take 500 mg by mouth every 6 (six) hours as needed. Patient not taking: Reported on 02/10/2023    [provider]  albuterol (VENTOLIN HFA) 108 (90 Base) MCG/ACT inhaler Inhale 2 puffs into the lungs every 6 (six) hours as needed for wheezing or shortness of breath. 06/13/22   Leslye Peer, MD  aspirin EC 81 MG tablet Take 1 tablet (81 mg total) by mouth in the morning. Swallow whole. 08/30/21   Leslye Peer, MD  atorvastatin (LIPITOR) 20 MG tablet Take 1 tablet (20 mg total) by mouth daily. 12/28/22   Pincus Sanes, MD  colchicine 0.6 MG tablet Take 1 tablet (0.6 mg total) by mouth every 6 (six) hours as needed (gout pain). Patient not taking: Reported on 01/23/2023 05/17/22   Nelwyn Salisbury, MD  fluticasone [facility]) 50 MCG/ACT nasal spray Place 2 sprays into both nostrils daily. Patient not taking: Reported on 02/10/2023 12/28/22   Pincus Sanes, MD  hydrochlorothiazide (HYDRODIURIL) 25 MG tablet TAKE ONE TABLET BY MOUTH DAILY 08/01/22   Pincus Sanes, MD  metoprolol succinate  (TOPROL-XL) 100 MG 24 hr tablet TAKE 1 TABLET BY MOUTH AT BEDTIME 10/31/22   Pincus Sanes, MD  promethazine-dextromethorphan (PROMETHAZINE-DM) 6.25-15 MG/5ML syrup Take 5 mLs by mouth 4 (four) times daily as needed for cough. 02/08/23   Leslye Peer, MD  vitamin B-12 (CYANOCOBALAMIN) 50 MCG tablet Take 50 mcg by mouth daily.    [provider]      Allergies    Benzonatate, Darvon [propoxyphene], Ibuprofen, Morphine and codeine, Prednisone, Sulfa antibiotics, Tussionex pennkinetic er [hydrocod poli-chlorphe poli er], Buprenorphine hcl, Ivp dye [iodinated contrast media], and Prochlorperazine edisylate    Review of Systems   Review of Systems  Physical Exam Updated Vital Signs BP 115/76   Pulse (!) 53   Temp 98.1 F (36.7 C) (Oral)   Resp 12   Ht 5' 5.5" (1.664 m)   Wt 88 kg   SpO2 96%   BMI 31.79 kg/m  Physical Exam Vitals and nursing note reviewed.  Constitutional:      General: She is not in acute distress.    Appearance: She is well-developed. She is not ill-appearing.  HENT:     Head: Normocephalic and atraumatic.     Nose: Nose normal.     Mouth/Throat:     Mouth: Mucous membranes are moist.  Eyes:     Extraocular Movements: Extraocular movements intact.  Conjunctiva/sclera: Conjunctivae normal.     Pupils: Pupils are equal, round, and reactive to light.  Cardiovascular:     Rate and Rhythm: Normal rate and regular rhythm.     Pulses: Normal pulses.     Heart sounds: Normal heart sounds. No murmur heard. Pulmonary:     Effort: Pulmonary effort is normal. No respiratory distress.     Breath sounds: Normal breath sounds.  Abdominal:     General: Abdomen is flat.     Palpations: Abdomen is soft.     Tenderness: There is no abdominal tenderness.  Musculoskeletal:        General: No swelling.     Cervical back: Normal range of motion and neck supple.  Skin:    General: Skin is warm and dry.     Capillary Refill: Capillary refill takes less than 2  seconds.  Neurological:     General: No focal deficit present.     Mental Status: She is alert and oriented to person, place, and time.     Cranial Nerves: No cranial nerve deficit.     Motor: No weakness.     Gait: Gait normal.     Comments: 5+ out of 5 strength throughout, decreased sensation to left lower extremity but otherwise sensation intact throughout, normal finger-nose-finger, normal speech, normal visual fields  Psychiatric:        Mood and Affect: Mood normal.     ED Results / Procedures / Treatments   Labs (all labs ordered are listed, but only abnormal results are displayed) Labs Reviewed  COMPREHENSIVE METABOLIC PANEL - Abnormal; Notable for the following components:      Result Value   Potassium 3.3 (*)    Anion gap 16 (*)    All other components within normal limits  CBC WITH DIFFERENTIAL/PLATELET  URINALYSIS, ROUTINE W REFLEX MICROSCOPIC  CBG MONITORING, ED    EKG EKG Interpretation  Date/Time:  Friday February 10 2023 20:58:20 EDT Ventricular Rate:  56 PR Interval:  163 QRS Duration: 95 QT Interval:  459 QTC Calculation: 443 R Axis:   47 Text Interpretation: Sinus rhythm Confirmed by Virgina Norfolk ([id number]) on 02/10/2023 9:00:21 PM  Radiology MR BRAIN WO CONTRAST  Result Date: 02/10/2023 CLINICAL DATA:  Acute neurologic deficit EXAM: MRI HEAD WITHOUT CONTRAST TECHNIQUE: Multiplanar, multiecho pulse sequences of the brain and surrounding structures were obtained without intravenous contrast. COMPARISON:  11/28/2020 FINDINGS: Brain: No acute infarct, mass effect or extra-axial collection. No acute or chronic hemorrhage. There is multifocal hyperintense T2-weighted signal within the white matter. Parenchymal volume and CSF spaces are normal. The midline structures are normal. Vascular: Major flow voids are preserved. Skull and upper cervical spine: Normal calvarium and skull base. Visualized upper cervical spine and soft tissues are normal. Sinuses/Orbits:No  paranasal sinus fluid levels or advanced mucosal thickening. No mastoid or middle ear effusion. Normal orbits. IMPRESSION: 1. No acute intracranial abnormality. 2. Multifocal hyperintense T2-weighted signal within the white matter, nonspecific but most commonly seen in the setting of chronic small vessel ischemia. Electronically Signed   By: Deatra Robinson M.D.   On: 02/10/2023 21:12   CT Head Wo Contrast  Result Date: 02/10/2023 CLINICAL DATA:  Neuro deficit, numbness in left arm and left leg, headaches EXAM: CT HEAD WITHOUT CONTRAST TECHNIQUE: Contiguous axial images were obtained from the base of the skull through the vertex without intravenous contrast. RADIATION DOSE REDUCTION: This exam was performed according to the departmental dose-optimization program which includes automated exposure control, adjustment  of the mA and/or kV according to patient size and/or use of iterative reconstruction technique. COMPARISON:  Previous MR brain done on 11/28/2020, maxillofacial CT done on 12/20/2022 FINDINGS: Brain: No acute intracranial findings are seen. There are no signs of bleeding within the cranium. There is no focal edema or mass effect. Ventricles are not dilated. Vascular: Unremarkable. Skull: No acute findings are seen in calvarium. Sinuses/Orbits: Unremarkable. Other: None. IMPRESSION: No acute intracranial findings are seen in noncontrast CT brain. Electronically Signed   By: Ernie Avena M.D.   On: 02/10/2023 19:46    Procedures Procedures    Medications Ordered in ED Medications  acetaminophen (TYLENOL) tablet 650 mg (650 mg Oral Given 02/10/23 1952)  ondansetron (ZOFRAN) injection 4 mg (4 mg Intravenous Given 02/10/23 1952)    ED Course/ Medical Decision Making/ A&P                             Medical Decision Making Amount and/or Complexity of Data Reviewed Labs: ordered. Radiology: ordered.  Risk OTC drugs. Prescription drug management.   Aleiya Zackery is here with  headache and left leg numbness.  History of migraine, stroke, high cholesterol.  She denies any back pain or weakness or speech changes or vision changes.  She is got subjective numbness to the left leg compared to the right but otherwise neurological exam is normal.  Normal finger-nose-finger, normal speech.  She had a mild headache the last few days as well.  I offered her multiple headache medicines which is only comfortable with Tylenol.  She is concerned she had a stroke.  No concern for LVO.  Somewhat atypical story for stroke but will get MRI and evaluate for stroke.  I suspect may be complex migraine versus less likely stroke.  Will check basic labs reevaluate after CT and MRI.  Lab work for my review and interpretation unremarkable.  No significant anemia or electrolyte abnormality or kidney injury or leukocytosis.  EKG shows sinus rhythm.  No ischemic changes.  Head CT and MRI per radiology report unremarkable.  Overall I suspect complex migraine.  Will have her follow-up with neurology.  Discharged in good condition.  This chart was dictated using voice recognition software.  Despite best efforts to proofread,  errors can occur which can change the documentation meaning.         Final Clinical Impression(s) / ED Diagnoses Final diagnoses:  Numbness  Nonintractable headache, unspecified chronicity pattern, unspecified headache type    Rx / DC Orders ED Discharge Orders     None         Virgina Norfolk, DO 02/10/23 2215

## 2023-02-13 ENCOUNTER — Telehealth: Payer: Self-pay | Admitting: Internal Medicine

## 2023-02-13 ENCOUNTER — Encounter: Payer: Self-pay | Admitting: Internal Medicine

## 2023-02-13 NOTE — Progress Notes (Unsigned)
Subjective:    Patient ID: Kathleen Solis, female    DOB: [birth year], 57 y.o.   MRN: [id number]     HPI Kathleen Solis is here for follow up from the hospital.    ED 6/21 for weakness.  She saw Dr Milinda Cave initially because of her symptoms that were strokelike he recommended further evaluation in the ED.  She was experiencing headache, numbness in her left lower leg.  She had not had any weakness, speech changes or vision changes.  She had a headache for a few days.  The numbness had started approximately 24 hours prior to going to the ED.  Cbc,cmp, ua  unremarkable except slightly low K.  EKG, CT head  and MRI brain negative except chronic small vessel ischemia.  On exam in ED - no weakness.  Decreased sensation in LLE.  CN intact.   Medications and allergies reviewed with patient and updated if appropriate.  Current Outpatient Medications on File Prior to Visit  Medication Sig Dispense Refill   acetaminophen (TYLENOL) 500 MG tablet Take 500 mg by mouth every 6 (six) hours as needed. (Patient not taking: Reported on 02/10/2023)     albuterol (VENTOLIN HFA) 108 (90 Base) MCG/ACT inhaler Inhale 2 puffs into the lungs every 6 (six) hours as needed for wheezing or shortness of breath. 8 g 6   aspirin EC 81 MG tablet Take 1 tablet (81 mg total) by mouth in the morning. Swallow whole.     atorvastatin (LIPITOR) 20 MG tablet Take 1 tablet (20 mg total) by mouth daily. 90 tablet 3   colchicine 0.6 MG tablet Take 1 tablet (0.6 mg total) by mouth every 6 (six) hours as needed (gout pain). (Patient not taking: Reported on 01/23/2023) 60 tablet 0   fluticasone (FLONASE) 50 MCG/ACT nasal spray Place 2 sprays into both nostrils daily. (Patient not taking: Reported on 02/10/2023) 16 g 8   hydrochlorothiazide (HYDRODIURIL) 25 MG tablet TAKE ONE TABLET BY MOUTH DAILY 90 tablet 2   metoprolol succinate (TOPROL-XL) 100 MG 24 hr tablet TAKE 1 TABLET BY MOUTH AT BEDTIME 90 tablet 1    promethazine-dextromethorphan (PROMETHAZINE-DM) 6.25-15 MG/5ML syrup Take 5 mLs by mouth 4 (four) times daily as needed for cough. 240 mL 0   vitamin B-12 (CYANOCOBALAMIN) 50 MCG tablet Take 50 mcg by mouth daily.     No current facility-administered medications on file prior to visit.     Review of Systems     Objective:  There were no vitals filed for this visit. BP Readings from Last 3 Encounters:  02/10/23 115/76  02/10/23 113/74  02/03/23 124/76   Wt Readings from Last 3 Encounters:  02/10/23 194 lb 0.1 oz (88 kg)  02/10/23 194 lb (88 kg)  02/03/23 192 lb 6.4 oz (87.3 kg)   There is no height or weight on file to calculate BMI.    Physical Exam     Lab Results  Component Value Date   WBC 9.6 02/10/2023   HGB 13.6 02/10/2023   HCT 38.4 02/10/2023   PLT 228 02/10/2023   GLUCOSE 90 02/10/2023   CHOL 158 12/28/2022   TRIG 111.0 12/28/2022   HDL 66.80 12/28/2022   LDLCALC 69 12/28/2022   ALT 22 02/10/2023   AST 27 02/10/2023   NA 142 02/10/2023   K 3.3 (L) 02/10/2023   CL 100 02/10/2023   CREATININE 0.82 02/10/2023   BUN 12 02/10/2023   CO2 26 02/10/2023   TSH 1.26  12/28/2022   INR 1.18 11/24/2015   HGBA1C 5.0 12/28/2022     Assessment & Plan:    See Problem List for Assessment and Plan of chronic medical problems.

## 2023-02-13 NOTE — Telephone Encounter (Signed)
Pt was seen at ER this week and she stated that some of her blood work results was not good. Pt wanted to speak with nurse about this situation. Please advise

## 2023-02-13 NOTE — Patient Instructions (Signed)
      Blood work was ordered.   The lab is on the first floor.    Medications changes include :       A referral was ordered and someone will call you to schedule an appointment.     No follow-ups on file.  

## 2023-02-13 NOTE — Telephone Encounter (Signed)
We will discuss at her appointment.

## 2023-02-14 ENCOUNTER — Ambulatory Visit (INDEPENDENT_AMBULATORY_CARE_PROVIDER_SITE_OTHER): Payer: 59 | Admitting: Internal Medicine

## 2023-02-14 VITALS — BP 128/70 | HR 62 | Temp 98.3°F | Ht 65.5 in | Wt 194.0 lb

## 2023-02-14 DIAGNOSIS — E876 Hypokalemia: Secondary | ICD-10-CM | POA: Insufficient documentation

## 2023-02-14 DIAGNOSIS — R200 Anesthesia of skin: Secondary | ICD-10-CM | POA: Diagnosis not present

## 2023-02-14 DIAGNOSIS — I10 Essential (primary) hypertension: Secondary | ICD-10-CM

## 2023-02-14 DIAGNOSIS — G8929 Other chronic pain: Secondary | ICD-10-CM | POA: Diagnosis not present

## 2023-02-14 DIAGNOSIS — R519 Headache, unspecified: Secondary | ICD-10-CM

## 2023-02-14 MED ORDER — POTASSIUM CHLORIDE CRYS ER 20 MEQ PO TBCR
20.0000 meq | EXTENDED_RELEASE_TABLET | Freq: Every day | ORAL | 3 refills | Status: DC
Start: 2023-02-14 — End: 2024-05-16

## 2023-02-14 NOTE — Assessment & Plan Note (Signed)
Chronic Blood pressure well controlled Continue HCTZ 25 mg daily, metoprolol XL 100 mg daily

## 2023-02-14 NOTE — Assessment & Plan Note (Signed)
Subacute Potassium has been low-normal saline and was slightly low in the ED Secondary to diuretic use Start potassium chloride 20 mill equivalent daily

## 2023-02-14 NOTE — Assessment & Plan Note (Signed)
Chronic Having daily headaches Following with neurology-Dr. Everlena Cooper.  Workup negative.  At his last visit he recommended trying nortriptyline or a muscle relaxer which she did not want to do History of OSA-did not tolerate CPAP in the past Will be having another sleep study next month to reevaluate OSA possible cause of chronic headaches and hopefully reevaluating and getting on treatment will improve headaches She deferred any daily medication at this time She is taking Excedrin Migraine twice a day every other day-advised not to take this more than twice a week to avoid rebound headaches

## 2023-02-14 NOTE — Assessment & Plan Note (Addendum)
Acute Symptoms started last week Has associated posterior leg pain suggestive of sciatica No leg weakness No lower back pain Possible nerve pain though the numbness in the entire leg does not make complete sense Discussed options such as sports medicine evaluation, physical therapy, medication-she is not interested in seeing anyone else or taking any other medication She will treat conservatively, and let me know if it does not improve

## 2023-03-07 ENCOUNTER — Ambulatory Visit (HOSPITAL_BASED_OUTPATIENT_CLINIC_OR_DEPARTMENT_OTHER): Payer: 59 | Attending: Emergency Medicine | Admitting: Pulmonary Disease

## 2023-03-07 VITALS — Ht 65.5 in | Wt 194.0 lb

## 2023-03-07 DIAGNOSIS — G4733 Obstructive sleep apnea (adult) (pediatric): Secondary | ICD-10-CM

## 2023-03-08 ENCOUNTER — Telehealth: Payer: Self-pay | Admitting: Emergency Medicine

## 2023-03-08 DIAGNOSIS — G4733 Obstructive sleep apnea (adult) (pediatric): Secondary | ICD-10-CM | POA: Diagnosis not present

## 2023-03-08 NOTE — Procedures (Signed)
     Patient Name: Kathleen Solis, Kathleen Solis Date: 03/07/2023 Gender: Female D.O.B: 06/06/1966 Age (years): 57 Referring Provider: Levy Pupa Height (inches): 66 Interpreting Physician: Coralyn Helling MD, ABSM Weight (lbs): 194 RPSGT: Shelah Lewandowsky BMI: 32 MRN: [id number] Neck Size: 15.00  CLINICAL INFORMATION Sleep Study Type: NPSG  Indication for sleep study: Fatigue, Hypertension, Morning Headaches, Obesity, Snoring  Epworth Sleepiness Score: 3  Most recent polysomnogram dated 08/12/2021 revealed an AHI of 6.4/h and RDI of 7.7/h.  SLEEP STUDY TECHNIQUE As per the AASM Manual for the Scoring of Sleep and Associated Events v2.3 (April 2016) with a hypopnea requiring 4% desaturations.  The channels recorded and monitored were frontal, central and occipital EEG, electrooculogram (EOG), submentalis EMG (chin), nasal and oral airflow, thoracic and abdominal wall motion, anterior tibialis EMG, snore microphone, electrocardiogram, and pulse oximetry.  MEDICATIONS Medications self-administered by patient taken the night of the study : ATORVASTATIN, HYDROCHLOROTHIAZIDE, Metoproplol, PROMETHAZINE DM SYRUP, TYLENOL  SLEEP ARCHITECTURE The study was initiated at 10:51:17 PM and ended at 4:56:39 AM.  Sleep onset time was 13.5 minutes and the sleep efficiency was 85.7%. The total sleep time was 313 minutes.  Stage REM latency was 202.5 minutes.  The patient spent 7.3% of the night in stage N1 sleep, 75.6% in stage N2 sleep, 0.0% in stage N3 and 17.1% in REM.  Alpha intrusion was absent.  Supine sleep was 51.76%.  RESPIRATORY PARAMETERS The overall apnea/hypopnea index (AHI) was 7.1 per hour. There were 8 total apneas, including 7 obstructive, 0 central and 1 mixed apneas. There were 29 hypopneas and 28 RERAs.  The AHI during Stage REM sleep was 22.4 per hour.  AHI while supine was 11.9 per hour.  The mean oxygen saturation was 92.3%. The minimum SpO2 during sleep was  73.0%.  moderate snoring was noted during this study.  CARDIAC DATA The 2 lead EKG demonstrated sinus rhythm. The mean heart rate was 57.6 beats per minute. Other EKG findings include: PVCs.  LEG MOVEMENT DATA The total PLMS were 0 with a resulting PLMS index of 0.0. Associated arousal with leg movement index was 0.0 .  IMPRESSIONS - Mild obstructive sleep apnea with an overall AHI of 7.1 and SpO2 low of 73%.  She had a significant REM effect to her respiratory events. - Supplemental oxygen was not used during this study. - The patient snored with moderate snoring volume.  DIAGNOSIS - Obstructive Sleep Apnea (G47.33)  RECOMMENDATIONS - Additional therapies include CPAP, oral appliance, or surgical assessment. - Avoid alcohol, sedatives and other CNS depressants that may worsen sleep apnea and disrupt normal sleep architecture. - Sleep hygiene should be reviewed to assess factors that may improve sleep quality. - Weight management and regular exercise should be initiated or continued if appropriate.  [Electronically signed] 03/08/2023 01:44 PM  Coralyn Helling MD, ABSM Diplomate, American Board of Sleep Medicine NPI: [id number]  [facility] PH: [phone]   FX: [phone] ACCREDITED BY THE AMERICAN ACADEMY OF SLEEP MEDICINE

## 2023-03-15 MED ORDER — PROMETHAZINE-DM 6.25-15 MG/5ML PO SYRP: 5.0000 mL | ORAL_SOLUTION | Freq: Four times a day (QID) | ORAL | 0 refills | Status: DC | PRN

## 2023-03-15 NOTE — Telephone Encounter (Signed)
Received a call from patient. She was calling for a refill on her promethazine cough syrup to be sent to [facility].   I advised her that I would go ahead and send in the refill for her. She verbalized understanding.   Nothing further needed at time of call.

## 2023-04-11 ENCOUNTER — Other Ambulatory Visit: Payer: Self-pay | Admitting: Emergency Medicine

## 2023-04-11 NOTE — Telephone Encounter (Signed)
Patient is calling wanting a refill of her promethozine DM cough syrup.

## 2023-04-13 NOTE — Telephone Encounter (Signed)
Pt still hasn't got her refill for  promethazine-dextromethorphan (PROMETHAZINE-DM) 6.25-15 MG/5ML syrup

## 2023-04-13 NOTE — Telephone Encounter (Signed)
Last refill 03/15/23 no refills  Please advise, thank you!

## 2023-04-14 ENCOUNTER — Telehealth: Payer: Self-pay | Admitting: Emergency Medicine

## 2023-04-14 MED ORDER — PROMETHAZINE-DM 6.25-15 MG/5ML PO SYRP: 5.0000 mL | ORAL_SOLUTION | Freq: Four times a day (QID) | ORAL | 0 refills | Status: DC | PRN

## 2023-04-14 NOTE — Telephone Encounter (Signed)
Please let her know I refilled

## 2023-04-14 NOTE — Telephone Encounter (Signed)
Patient is calling for a medication refill of her promethazine DM cough syrup. [facility]acy [facility] [address]

## 2023-04-14 NOTE — Telephone Encounter (Signed)
Spoke with patient. Advised Cough syrup has been refilled. NFN

## 2023-04-16 DIAGNOSIS — R519 Headache, unspecified: Secondary | ICD-10-CM | POA: Diagnosis not present

## 2023-04-16 DIAGNOSIS — R109 Unspecified abdominal pain: Secondary | ICD-10-CM | POA: Diagnosis not present

## 2023-04-16 DIAGNOSIS — H66003 Acute suppurative otitis media without spontaneous rupture of ear drum, bilateral: Secondary | ICD-10-CM | POA: Diagnosis not present

## 2023-04-16 DIAGNOSIS — J0110 Acute frontal sinusitis, unspecified: Secondary | ICD-10-CM | POA: Diagnosis not present

## 2023-04-27 NOTE — Patient Instructions (Addendum)
     Have blood work done.     Medications changes include :   none     Return if symptoms worsen or fail to improve.

## 2023-04-27 NOTE — Progress Notes (Signed)
Subjective:    Patient ID: Kathleen Solis, female    DOB: 01-Sep-1965, 57 y.o.   MRN: [id number]      HPI Kathleen Solis is here for  Chief Complaint  Patient presents with   Sinus Problem    Went to UC last Sunday and was treated for double ear infection and sinus infection; Congestion, not energy; Right ear pain, pain on right side of back    She is here for an acute visit for cold symptoms.  She was seen 8/25 at urgent care - dx with b/l otitis media and sinus infection and placed on augmentin x 10 days, flonase.  Covid, flu, strep negative.   Her symptoms started around 8/23.  She does not feel any better since taking the Augmentin.  She is experiencing decreased appetite, fatigue, nasal congestion, right sided ear pain, PND, Sinus pressure, cough -  dry, N/D, headaches and lightheadedness.  She states the cough is not that bad.  She is mostly concerned about the headache.  She will take 1 Tylenol for the headache couple of times a day.  She has tried taking  flonase, tylenol for HA   Covid test here today negative   Medications and allergies reviewed with patient and updated if appropriate.  Current Outpatient Medications on File Prior to Visit  Medication Sig Dispense Refill   acetaminophen (TYLENOL) 500 MG tablet Take 500 mg by mouth every 6 (six) hours as needed.     albuterol (VENTOLIN HFA) 108 (90 Base) MCG/ACT inhaler Inhale 2 puffs into the lungs every 6 (six) hours as needed for wheezing or shortness of breath. 8 g 6   aspirin EC 81 MG tablet Take 1 tablet (81 mg total) by mouth in the morning. Swallow whole.     atorvastatin (LIPITOR) 20 MG tablet Take 1 tablet (20 mg total) by mouth daily. 90 tablet 3   hydrochlorothiazide (HYDRODIURIL) 25 MG tablet TAKE ONE TABLET BY MOUTH DAILY 90 tablet 2   metoprolol succinate (TOPROL-XL) 100 MG 24 hr tablet TAKE 1 TABLET BY MOUTH AT BEDTIME 90 tablet 1   potassium chloride SA (KLOR-CON M) 20 MEQ tablet Take 1 tablet (20 mEq  total) by mouth daily. 90 tablet 3   promethazine-dextromethorphan (PROMETHAZINE-DM) 6.25-15 MG/5ML syrup Take 5 mLs by mouth 4 (four) times daily as needed for cough. 240 mL 0   vitamin B-12 (CYANOCOBALAMIN) 50 MCG tablet Take 50 mcg by mouth daily.     colchicine 0.6 MG tablet Take 1 tablet (0.6 mg total) by mouth every 6 (six) hours as needed (gout pain). (Patient not taking: Reported on 04/28/2023) 60 tablet 0   fluticasone (FLONASE) 50 MCG/ACT nasal spray Place 2 sprays into both nostrils daily. (Patient not taking: Reported on 04/28/2023) 16 g 8   No current facility-administered medications on file prior to visit.    Review of Systems  Constitutional:  Positive for appetite change (dec) and fatigue. Negative for chills and fever.  HENT:  Positive for congestion, ear pain (right ear pain), postnasal drip, sinus pressure and sore throat (PND).   Respiratory:  Positive for cough (dry). Negative for choking, shortness of breath and wheezing.   Cardiovascular:  Negative for chest pain.  Gastrointestinal:  Positive for diarrhea and nausea.  Musculoskeletal:  Positive for back pain (right lower back pain). Negative for myalgias.  Neurological:  Positive for light-headedness and headaches. Negative for dizziness.       Objective:   Vitals:   04/28/23 1322  BP: 116/78  Pulse: (!) 58  Temp: 98.4 F (36.9 C)  SpO2: 95%   BP Readings from Last 3 Encounters:  04/28/23 116/78  02/14/23 128/70  02/10/23 115/76   Wt Readings from Last 3 Encounters:  04/28/23 192 lb (87.1 kg)  03/07/23 194 lb (88 kg)  02/14/23 194 lb (88 kg)   Body mass index is 31.46 kg/m.    Physical Exam Constitutional:      General: She is not in acute distress.    Appearance: Normal appearance. She is not ill-appearing.  HENT:     Head: Normocephalic and atraumatic.     Right Ear: Tympanic membrane, ear canal and external ear normal.     Left Ear: Tympanic membrane, ear canal and external ear normal.      Mouth/Throat:     Mouth: Mucous membranes are moist.     Pharynx: No oropharyngeal exudate or posterior oropharyngeal erythema.  Eyes:     Conjunctiva/sclera: Conjunctivae normal.  Cardiovascular:     Rate and Rhythm: Normal rate and regular rhythm.  Pulmonary:     Effort: Pulmonary effort is normal. No respiratory distress.     Breath sounds: Normal breath sounds. No wheezing or rales.  Musculoskeletal:     Cervical back: Neck supple. No tenderness.  Lymphadenopathy:     Cervical: No cervical adenopathy.  Skin:    General: Skin is warm and dry.  Neurological:     Mental Status: She is alert.            Assessment & Plan:    See Problem List for Assessment and Plan of chronic medical problems.

## 2023-04-28 ENCOUNTER — Ambulatory Visit (INDEPENDENT_AMBULATORY_CARE_PROVIDER_SITE_OTHER): Payer: Medicare Other | Admitting: Internal Medicine

## 2023-04-28 ENCOUNTER — Encounter: Payer: Self-pay | Admitting: Internal Medicine

## 2023-04-28 VITALS — BP 116/78 | HR 58 | Temp 98.4°F | Ht 65.5 in | Wt 192.0 lb

## 2023-04-28 DIAGNOSIS — I10 Essential (primary) hypertension: Secondary | ICD-10-CM

## 2023-04-28 DIAGNOSIS — R519 Headache, unspecified: Secondary | ICD-10-CM

## 2023-04-28 DIAGNOSIS — J069 Acute upper respiratory infection, unspecified: Secondary | ICD-10-CM | POA: Diagnosis not present

## 2023-04-28 DIAGNOSIS — G4486 Cervicogenic headache: Secondary | ICD-10-CM | POA: Insufficient documentation

## 2023-04-28 DIAGNOSIS — R5383 Other fatigue: Secondary | ICD-10-CM | POA: Diagnosis not present

## 2023-04-28 LAB — CBC WITH DIFFERENTIAL/PLATELET
Basophils Absolute: 0.1 10*3/uL (ref 0.0–0.1)
Basophils Relative: 0.6 % (ref 0.0–3.0)
Eosinophils Absolute: 0.1 10*3/uL (ref 0.0–0.7)
Eosinophils Relative: 0.9 % (ref 0.0–5.0)
HCT: 42.5 % (ref 36.0–46.0)
Hemoglobin: 14.7 g/dL (ref 12.0–15.0)
Lymphocytes Relative: 24.4 % (ref 12.0–46.0)
Lymphs Abs: 2.2 10*3/uL (ref 0.7–4.0)
MCHC: 34.6 g/dL (ref 30.0–36.0)
MCV: 93.8 fl (ref 78.0–100.0)
Monocytes Absolute: 0.5 10*3/uL (ref 0.1–1.0)
Monocytes Relative: 5.3 % (ref 3.0–12.0)
Neutro Abs: 6.2 10*3/uL (ref 1.4–7.7)
Neutrophils Relative %: 68.8 % (ref 43.0–77.0)
Platelets: 220 10*3/uL (ref 150.0–400.0)
RBC: 4.54 Mil/uL (ref 3.87–5.11)
RDW: 13.8 % (ref 11.5–15.5)
WBC: 9 10*3/uL (ref 4.0–10.5)

## 2023-04-28 LAB — COMPREHENSIVE METABOLIC PANEL
ALT: 15 U/L (ref 0–35)
AST: 19 U/L (ref 0–37)
Albumin: 4.2 g/dL (ref 3.5–5.2)
Alkaline Phosphatase: 86 U/L (ref 39–117)
BUN: 12 mg/dL (ref 6–23)
CO2: 34 meq/L — ABNORMAL HIGH (ref 19–32)
Calcium: 9.8 mg/dL (ref 8.4–10.5)
Chloride: 98 meq/L (ref 96–112)
Creatinine, Ser: 0.75 mg/dL (ref 0.40–1.20)
GFR: 89.03 mL/min (ref >60.00)
Glucose, Bld: 89 mg/dL (ref 70–99)
Potassium: 3.7 meq/L (ref 3.5–5.1)
Sodium: 140 meq/L (ref 135–145)
Total Bilirubin: 1.2 mg/dL (ref 0.2–1.2)
Total Protein: 7.8 g/dL (ref 6.0–8.3)

## 2023-04-28 LAB — TSH: TSH: 1.58 u[IU]/mL (ref 0.35–5.50)

## 2023-04-28 MED ORDER — METOPROLOL SUCCINATE ER 100 MG PO TB24: 100.0000 mg | ORAL_TABLET | Freq: Every day | ORAL | 1 refills | Status: DC

## 2023-04-28 MED ORDER — HYDROCHLOROTHIAZIDE 25 MG PO TABS: 25.0000 mg | ORAL_TABLET | Freq: Every day | ORAL | 2 refills | Status: DC

## 2023-04-28 NOTE — Assessment & Plan Note (Signed)
Acute Symptoms consistent with viral URI Do not think she needs additional antibiotics at this time Discussed symptomatic treatment Continue Tylenol, Flonase Discussed that she can try Coricidin cold products COVID, flu and strep test negative at urgent care and COVID test negative here today

## 2023-04-28 NOTE — Assessment & Plan Note (Signed)
Acute Secondary to viral URI Currently taking 1 Tylenol twice daily Does not tolerate NSAIDs or steroids Advised to avoid Tylenol on a regular basis, but when she does take it to take a higher dose which may help with the headache more Continue increase fluids and symptomatic treatment for URI

## 2023-04-28 NOTE — Assessment & Plan Note (Signed)
Acute Related to current infection, but also has some element of chronic fatigue Will check CBC, CMP and TSH-expect this to be normal Continue supportive care for viral infection, continue increased fluids, rest, but encouraged her to start moving around more as she is starting to feel better

## 2023-04-28 NOTE — Assessment & Plan Note (Signed)
Chronic Blood pressure well controlled Continue HCTZ 25 mg daily, metoprolol XL 100 mg daily

## 2023-04-29 ENCOUNTER — Other Ambulatory Visit: Payer: Self-pay | Admitting: Internal Medicine

## 2023-05-10 ENCOUNTER — Telehealth: Payer: Self-pay | Admitting: Emergency Medicine

## 2023-05-10 NOTE — Telephone Encounter (Signed)
Patient needs a refill on cough syrup and Flonase.  Ph[facility]acy [facility] on [address]

## 2023-05-12 ENCOUNTER — Telehealth: Payer: Self-pay | Admitting: Emergency Medicine

## 2023-05-12 MED ORDER — FLUTICASONE PROPIONATE 50 MCG/ACT NA SUSP: 2.0000 | Freq: Every day | NASAL | 8 refills | Status: DC

## 2023-05-12 MED ORDER — PROMETHAZINE-DM 6.25-15 MG/5ML PO SYRP: 5.0000 mL | ORAL_SOLUTION | Freq: Four times a day (QID) | ORAL | 0 refills | Status: DC | PRN

## 2023-05-12 NOTE — Telephone Encounter (Signed)
Attempted to call pt but unable to reach. Left pt a detailed message that request will be sent over to [facility] for approval.  Dr. Delton Coombes, please advise if you are okay refilling pt's promethazine-DM cough syrup.  Pharmacy where the cough syrup was last filled at was [facility].

## 2023-05-12 NOTE — Telephone Encounter (Signed)
I sent refills for her

## 2023-05-12 NOTE — Telephone Encounter (Signed)
Patient is completely out of her medication cough syrup and flonase.

## 2023-05-12 NOTE — Telephone Encounter (Signed)
Message has already been sent to Dr. Delton Coombes about approval to have cough med refilled. Refill of flonase has been sent to pharmacy.

## 2023-05-13 NOTE — Telephone Encounter (Signed)
Called and spoke with patient. She is aware that the cough syrup has been sent.   Nothing further needed at time of call.

## 2023-06-08 ENCOUNTER — Encounter: Payer: Self-pay | Admitting: Emergency Medicine

## 2023-06-08 ENCOUNTER — Ambulatory Visit: Payer: 59 | Admitting: Emergency Medicine

## 2023-06-08 ENCOUNTER — Ambulatory Visit: Payer: 59

## 2023-06-08 VITALS — BP 132/90 | HR 66 | Ht 65.5 in | Wt 195.6 lb

## 2023-06-08 VITALS — BP 118/70 | HR 66 | Temp 98.1°F | Resp 16 | Ht 65.5 in | Wt 195.0 lb

## 2023-06-08 DIAGNOSIS — R053 Chronic cough: Secondary | ICD-10-CM

## 2023-06-08 DIAGNOSIS — Z0000 Encounter for general adult medical examination without abnormal findings: Secondary | ICD-10-CM

## 2023-06-08 DIAGNOSIS — G4733 Obstructive sleep apnea (adult) (pediatric): Secondary | ICD-10-CM | POA: Diagnosis not present

## 2023-06-08 DIAGNOSIS — R9389 Abnormal findings on diagnostic imaging of other specified body structures: Secondary | ICD-10-CM | POA: Diagnosis not present

## 2023-06-08 DIAGNOSIS — J4520 Mild intermittent asthma, uncomplicated: Secondary | ICD-10-CM

## 2023-06-08 NOTE — Patient Instructions (Addendum)
Kathleen Solis , Thank you for taking time to come for your Medicare Wellness Visit. I appreciate your ongoing commitment to your health goals. Please review the following plan we discussed and let me know if I can assist you in the future.   Referrals/Orders/Follow-Ups/Clinician Recommendations: NO  This is a list of the screening recommended for you and due dates:  Health Maintenance  Topic Date Due   DTaP/Tdap/Td vaccine (1 - Tdap) Never done   Zoster (Shingles) Vaccine (1 of 2) Never done   COVID-19 Vaccine (3 - 2023-24 season) 04/23/2023   Medicare Annual Wellness Visit  05/13/2023   Flu Shot  11/20/2023*   Mammogram  09/07/2023   Colon Cancer Screening  04/06/2025   Hepatitis C Screening  Completed   HIV Screening  Completed   HPV Vaccine  Aged Out  *Topic was postponed. The date shown is not the original due date.    Advanced directives: (Declined) Advance directive discussed with you today. Even though you declined this today, please call our office should you change your mind, and we can give you the proper paperwork for you to fill out.  Next Medicare Annual Wellness Visit scheduled for next year: Yes

## 2023-06-08 NOTE — Patient Instructions (Signed)
We will order CPAP and CPAP supplies for you.  Will also arrange for a CPAP mask fitting session to determine the best mask for you to wear We will plan to repeat your CT scan of the chest in December Continue your fluticasone nasal spray.  Increase to 2 sprays each nostril once daily Continue your promethazine/DM cough syrup when needed for cough suppression.  You can call us when you need a refill Follow Dr. Delton Coombes in December after your CT chest so we can review.  We will also review your CPAP compliance and status at that time.

## 2023-06-08 NOTE — Progress Notes (Signed)
Subjective:   Kathleen Solis is a 57 y.o. female who presents for Medicare Annual (Subsequent) preventive examination.  Visit Complete: In person  Cardiac Risk Factors include: advanced age (>59men, >46 women);family history of premature cardiovascular disease;hypertension;obesity (BMI >30kg/m2)     Objective:    Today's Vitals   06/08/23 1048  BP: 118/70  Pulse: 66  Resp: 16  Temp: 98.1 F (36.7 C)  TempSrc: Temporal  SpO2: 97%  Weight: 195 lb (88.5 kg)  Height: 5' 5.5" (1.664 m)  PainSc: 0-No pain   Body mass index is 31.96 kg/m.     06/08/2023   11:00 AM 03/07/2023    9:09 PM 02/10/2023    6:15 PM 01/23/2023    9:44 AM 05/12/2022   10:15 AM 04/17/2022   12:20 AM 12/15/2021    9:34 AM  Advanced Directives  Does Patient Have a Medical Advance Directive? No No No No No No No  Would patient like information on creating a medical advance directive? No - Patient declined No - Patient declined No - Patient declined  No - Patient declined      Current Medications (verified) Outpatient Encounter Medications as of 06/08/2023  Medication Sig   acetaminophen (TYLENOL) 500 MG tablet Take 500 mg by mouth every 6 (six) hours as needed.   albuterol (VENTOLIN HFA) 108 (90 Base) MCG/ACT inhaler Inhale 2 puffs into the lungs every 6 (six) hours as needed for wheezing or shortness of breath.   aspirin EC 81 MG tablet Take 1 tablet (81 mg total) by mouth in the morning. Swallow whole.   atorvastatin (LIPITOR) 20 MG tablet Take 1 tablet (20 mg total) by mouth daily.   fluticasone (FLONASE) 50 MCG/ACT nasal spray Place 2 sprays into both nostrils daily.   hydrochlorothiazide (HYDRODIURIL) 25 MG tablet Take 1 tablet (25 mg total) by mouth daily.   metoprolol succinate (TOPROL-XL) 100 MG 24 hr tablet Take 1 tablet (100 mg total) by mouth at bedtime.   promethazine-dextromethorphan (PROMETHAZINE-DM) 6.25-15 MG/5ML syrup Take 5 mLs by mouth 4 (four) times daily as needed for cough.   vitamin  B-12 (CYANOCOBALAMIN) 50 MCG tablet Take 50 mcg by mouth daily.   colchicine 0.6 MG tablet Take 1 tablet (0.6 mg total) by mouth every 6 (six) hours as needed (gout pain). (Patient not taking: Reported on 04/28/2023)   potassium chloride SA (KLOR-CON M) 20 MEQ tablet Take 1 tablet (20 mEq total) by mouth daily. (Patient not taking: Reported on 06/08/2023)   No facility-administered encounter medications on file as of 06/08/2023.    Allergies (verified) Benzonatate, Darvon [propoxyphene], Ibuprofen, Morphine and codeine, Prednisone, Sulfa antibiotics, Tussionex pennkinetic er [hydrocod poli-chlorphe poli er], Buprenorphine hcl, Ivp dye [iodinated contrast media], and Prochlorperazine edisylate   History: Past Medical History:  Diagnosis Date   Allergic rhinitis 08/16/2016   Benign essential hypertension 03/03/2015   Bone spur of right foot 02/08/2016   Bunion of left foot 02/08/2016   Carotid arterial disease 04/10/2016   03/2016: right 40-59%, left < 40%; 10/2020 carotid US: No evidence of B ICA stenosis   Cerebrovascular accident (CVA)    MRI - Chronic hyperintensity left frontal parietal white matter unchanged likely due to chronic ischemia.   Chest pain 02/14/2017   Cyst of left ovary 03/03/2015   Diverticulitis of colon without hemorrhage 02/25/2016   Epigastric pain 05/06/2020   Episodic tension-type headache 01/12/2016   Taking motrin 200 mg bid   GERD (gastroesophageal reflux disease)    High-tone pelvic  floor dysfunction 10/27/2021   History of COVID-19 05/18/2021   History of gout 06/23/2022   Hyperglycemia 02/14/2017   Chronic; encouraged low sugar/carbohydrate diet and regular exercise   Hyperlipidemia    diet controlled - no med   Left upper quadrant pain    Leg neuralgia, right    Related to CVA    Lower respiratory infection 07/18/2018   Lumbar back pain 05/10/2018   Lumbar radiculopathy 05/15/2018   Pain seems consistent with radiculopathy   MCI (mild cognitive  impairment) with memory loss 12/31/2020   Midline cystocele    Mild asthma 08/27/2020   no inhaler   Obstructive sleep apnea 08/06/2021   Intolerant of cpap   Onychomycosis 02/21/2018   Toenails; Ciclopirox prescribed   Oropharyngeal dysphagia 08/16/2016   Persistent cough 03/17/2019   Primary stress urinary incontinence 10/27/2021   Rib pain    Sciatica    right   Sinus headache 12/15/2022   Sinus infection 03/22/2019   SVD (spontaneous vaginal delivery)    x 2   Tinnitus of right ear 11/16/2016   no current problem as of 08/26/21 per patient   Urinary hesitancy 12/31/2020   Viral respiratory infection 12/04/2019   Vitamin B12 deficiency 02/23/2018   Past Surgical History:  Procedure Laterality Date   ABDOMINAL HYSTERECTOMY     partial   ANAL FISSURE REPAIR     APPENDECTOMY     BRONCHIAL WASHINGS  08/30/2021   Procedure: BRONCHIAL WASHINGS;  Surgeon: Leslye Peer, MD;  [facility] ENDOSCOPY;  Service: Cardiopulmonary;;   CHOLECYSTECTOMY     CYSTOSCOPY N/A 10/04/2017   Procedure: Bluford Kaufmann;  Surgeon: Gerald Leitz, MD;  Location: [facility] ORS;  Service: Gynecology;  Laterality: N/A;   FOOT SURGERY Left    big toe bunion removed   LAPAROSCOPIC BILATERAL SALPINGO OOPHERECTOMY Bilateral 10/04/2017   Procedure: LAPAROSCOPIC BILATERAL SALPINGO OOPHORECTOMY, PELVIC WASHINGS;  Surgeon: Gerald Leitz, MD;  Location: [facility] ORS;  Service: Gynecology;  Laterality: Bilateral;   SACRAL COLPOPEXY ROBOTIC, posterior repair, cystoscopy  07/04/2019   @ [facility]   TUBAL LIGATION     VIDEO BRONCHOSCOPY N/A 08/30/2021   Procedure: VIDEO BRONCHOSCOPY WITHOUT FLUORO;  Surgeon: Leslye Peer, MD;  Location: [facility] ENDOSCOPY;  Service: Cardiopulmonary;  Laterality: N/A;   WISDOM TOOTH EXTRACTION     Family History  Problem Relation Age of Onset   Breast cancer Sister    Diabetes Sister    Glaucoma Sister    Cataracts Sister    Hypertension Sister    Anuerysm Mother 64   Pancreatic cancer Father    Retinal  detachment Brother    Cataracts Sister    Breast cancer Cousin    Anuerysm Cousin    Breast cancer Niece    Esophageal cancer Nephew    Developmental delay Nephew    Cerebral palsy Son        mild   Colon polyps Neg Hx    Prostate cancer Neg Hx    Rectal cancer Neg Hx    Stomach cancer Neg Hx    Uterine cancer Neg Hx    Social History   Socioeconomic History   Marital status: Single    Spouse name: Not on file   Number of children: 2   Years of education: 14   Highest education level: Associate degree: academic program  Occupational History   Occupation: Disability  Tobacco Use   Smoking status: Never   Smokeless tobacco: Never  Vaping Use   Vaping status:  Never Used  Substance and Sexual Activity   Alcohol use: No    Alcohol/week: 0.0 standard drinks of alcohol   Drug use: No   Sexual activity: Not on file    Comment: Hysterectomy  Other Topics Concern   Not on file  Social History Narrative   Yes to regular exercise at [facility]      Pt is right handed, she occasionally drinks tea, walks QOD. She lives with her 32 yr old son, he has mild cerebral palsy.   Social Determinants of Health   Financial Resource Strain: Low Risk  (06/08/2023)   Overall Financial Resource Strain (CARDIA)    Difficulty of Paying Living Expenses: Not hard at all  Food Insecurity: No Food Insecurity (06/08/2023)   Hunger Vital Sign    Worried About Running Out of Food in the Last Year: Never true    Ran Out of Food in the Last Year: Never true  Transportation Needs: No Transportation Needs (06/08/2023)   PRAPARE - Administrator, Civil Service (Medical): No    Lack of Transportation (Non-Medical): No  Physical Activity: Sufficiently Active (06/08/2023)   Exercise Vital Sign    Days of Exercise per Week: 5 days    Minutes of Exercise per Session: 30 min  Stress: No Stress Concern Present (06/08/2023)   [employer] of Occupational Health - Occupational Stress  Questionnaire    Feeling of Stress : Not at all  Social Connections: Moderately Integrated (06/08/2023)   Social Connection and Isolation Panel [NHANES]    Frequency of Communication with Friends and Family: More than three times a week    Frequency of Social Gatherings with Friends and Family: More than three times a week    Attends Religious Services: More than 4 times per year    Active Member of [employer] or Organizations: Yes    Attends Engineer, structural: More than 4 times per year    Marital Status: Never married    Tobacco Counseling Counseling given: Not Answered   Clinical Intake:  Pre-visit preparation completed: Yes  Pain : No/denies pain Pain Score: 0-No pain     BMI - recorded: 31.96 Nutritional Status: BMI > 30  Obese Nutritional Risks: None Diabetes: No  How often do you need to have someone help you when you read instructions, pamphlets, or other written materials from your doctor or pharmacy?: 1 - Never What is the last grade level you completed in school?: College Graduate  Interpreter Needed?: No  Information entered by :: Adeline Petitfrere N. Yulia Ulrich, LPN.   Activities of Daily Living    06/08/2023   11:01 AM  In your present state of health, do you have any difficulty performing the following activities:  Hearing? 0  Vision? 0  Difficulty concentrating or making decisions? 0  Walking or climbing stairs? 0  Dressing or bathing? 0  Doing errands, shopping? 0  Preparing Food and eating ? N  Using the Toilet? N  In the past six months, have you accidently leaked urine? Y  Do you have problems with loss of bowel control? N  Managing your Medications? N  Managing your Finances? N  Housekeeping or managing your Housekeeping? N    Patient Care Team: Pincus Sanes, MD as PCP - General (Internal Medicine) Parke Poisson, MD as PCP - Cardiology (Cardiology) Drema Dallas, DO as Consulting Physician (Neurology) Hurshel Party, OD as  Consulting Physician (Optometry)  Indicate any recent Medical Services you [phone]  have received from other than [facility] providers in the past year (date may be approximate).     Assessment:   This is a routine wellness examination for Sherlon.  Hearing/Vision screen Hearing Screening - Comments:: Patient denied any hearing difficulty.   No hearing aids.   Vision Screening - Comments:: Patient does wear corrective lenses/contacts.  Annual eye exam done by: Cephus Richer, OD.     Goals Addressed             This Visit's Progress    Patient Stated       To lose 20 pounds by staying physically active.      Depression Screen    06/08/2023   11:05 AM 02/14/2023    8:07 AM 02/10/2023    4:09 PM 10/20/2022    1:07 PM 05/17/2022   11:29 AM 05/12/2022    9:36 AM 12/21/2021    9:39 AM  PHQ 2/9 Scores  PHQ - 2 Score 0 0 0 0 2 0 0  PHQ- 9 Score 4 0 0  10      Fall Risk    06/08/2023   11:00 AM 02/14/2023    8:06 AM 02/10/2023    4:08 PM 01/23/2023    9:43 AM 10/20/2022    1:07 PM  Fall Risk   Falls in the past year? 0 0 0 0 0  Number falls in past yr: 0 0  0 0  Injury with Fall? 0 0  0 0  Risk for fall due to : No Fall Risks No Fall Risks   No Fall Risks  Follow up Falls prevention discussed Falls evaluation completed Falls evaluation completed Falls evaluation completed Falls evaluation completed    MEDICARE RISK AT HOME: Medicare Risk at Home Any stairs in or around the home?: No If so, are there any without handrails?: No Home free of loose throw rugs in walkways, pet beds, electrical cords, etc?: Yes Adequate lighting in your home to reduce risk of falls?: Yes Life alert?: No Use of a cane, walker or w/c?: No Grab bars in the bathroom?: No Shower chair or bench in shower?: No Elevated toilet seat or a handicapped toilet?: No  TIMED UP AND GO:  Was the test performed?  Yes  Length of time to ambulate 10 feet: 8 sec Gait steady and fast without use of assistive device     Cognitive Function:    06/08/2023   11:09 AM  MMSE - Mini Mental State Exam  Orientation to time 5  Orientation to Place 5  Registration 3  Attention/ Calculation 5  Recall 3  Language- name 2 objects 2  Language- repeat 1  Language- follow 3 step command 3  Language- read & follow direction 1  Write a sentence 1  Copy design 1  Total score 30      12/26/2017    1:00 PM  Montreal Cognitive Assessment   Visuospatial/ Executive (0/5) 3  Naming (0/3) 3  Attention: Read list of digits (0/2) 0  Attention: Read list of letters (0/1) 1  Attention: Serial 7 subtraction starting at 100 (0/3) 1  Language: Repeat phrase (0/2) 0  Language : Fluency (0/1) 0  Abstraction (0/2) 1  Delayed Recall (0/5) 0  Orientation (0/6) 5  Total 14  Adjusted Score (based on education) 14      06/08/2023   11:01 AM 05/12/2022    9:43 AM  6CIT Screen  What Year? 0 points 0 points  What month?  0 points 0 points  What time? 0 points 0 points  Count back from 20 0 points 0 points  Months in reverse 0 points 0 points  Repeat phrase 0 points 0 points  Total Score 0 points 0 points    Immunizations Immunization History  Administered Date(s) Administered   PFIZER(Purple Top)SARS-COV-2 Vaccination 04/11/2020, 06/16/2020   PPD Test 05/23/2022    TDAP status: Due, Education has been provided regarding the importance of this vaccine. Advised may receive this vaccine at local pharmacy or Health Dept. Aware to provide a copy of the vaccination record if obtained from local pharmacy or Health Dept. Verbalized acceptance and understanding.  Flu Vaccine status: Declined, Education has been provided regarding the importance of this vaccine but patient still declined. Advised may receive this vaccine at local pharmacy or Health Dept. Aware to provide a copy of the vaccination record if obtained from local pharmacy or Health Dept. Verbalized acceptance and understanding.  Pneumococcal vaccine status:  Declined,  Education has been provided regarding the importance of this vaccine but patient still declined. Advised may receive this vaccine at local pharmacy or Health Dept. Aware to provide a copy of the vaccination record if obtained from local pharmacy or Health Dept. Verbalized acceptance and understanding.   Covid-19 vaccine status: Declined, Education has been provided regarding the importance of this vaccine but patient still declined. Advised may receive this vaccine at local pharmacy or Health Dept.or vaccine clinic. Aware to provide a copy of the vaccination record if obtained from local pharmacy or Health Dept. Verbalized acceptance and understanding.  Qualifies for Shingles Vaccine? Yes   Zostavax completed No   Shingrix Completed?: No.    Education has been provided regarding the importance of this vaccine. Patient has been advised to call insurance company to determine out of pocket expense if they have not yet received this vaccine. Advised may also receive vaccine at local pharmacy or Health Dept. Verbalized acceptance and understanding.  Screening Tests Health Maintenance  Topic Date Due   DTaP/Tdap/Td (1 - Tdap) Never done   Zoster Vaccines- Shingrix (1 of 2) Never done   COVID-19 Vaccine (3 - 2023-24 season) 04/23/2023   INFLUENZA VACCINE  11/20/2023 (Originally 03/23/2023)   MAMMOGRAM  09/07/2023   Medicare Annual Wellness (AWV)  06/07/2024   Colonoscopy  04/06/2025   Hepatitis C Screening  Completed   HIV Screening  Completed   HPV VACCINES  Aged Out    Health Maintenance  Health Maintenance Due  Topic Date Due   DTaP/Tdap/Td (1 - Tdap) Never done   Zoster Vaccines- Shingrix (1 of 2) Never done   COVID-19 Vaccine (3 - 2023-24 season) 04/23/2023    Colorectal cancer screening: Type of screening: Colonoscopy. Completed 04/07/2015. Repeat every 10 years  Mammogram status: Completed 09/06/2022. Repeat every year  Bone Density scan: Never done.  Lung Cancer  Screening: (Low Dose CT Chest recommended if Age 30-80 years, 20 pack-year currently smoking OR have quit w/in 15years.) does not qualify.   Lung Cancer Screening Referral: no  Additional Screening:  Hepatitis C Screening: does qualify; Completed 11/03/2020  Vision Screening: Recommended annual ophthalmology exams for early detection of glaucoma and other disorders of the eye. Is the patient up to date with their annual eye exam?  Yes  Who is the provider or what is the name of the office in which the patient attends annual eye exams? Cephus Richer, OD. If pt is not established with a provider, would they like to be  referred to a provider to establish care? No .   Dental Screening: Recommended annual dental exams for proper oral hygiene  Diabetic Foot Exam: N/A  Community Resource Referral / Chronic Care Management: CRR required this visit?  No   CCM required this visit?  No     Plan:     I have personally reviewed and noted the following in the patient's chart:   Medical and social history Use of alcohol, tobacco or illicit drugs  Current medications and supplements including opioid prescriptions. Patient is not currently taking opioid prescriptions. Functional ability and status Nutritional status Physical activity Advanced directives List of other physicians Hospitalizations, surgeries, and ER visits in previous 12 months Vitals Screenings to include cognitive, depression, and falls Referrals and appointments  In addition, I have reviewed and discussed with patient certain preventive protocols, quality metrics, and best practice recommendations. A written personalized care plan for preventive services as well as general preventive health recommendations were provided to patient.     Mickeal Needy, LPN   16/05/9603   After Visit Summary: (In Person-Printed) AVS printed and given to the patient  Nurse Notes: Normal cognitive status assessed by direct observation  by this Nurse Health Advisor. No abnormalities found.

## 2023-06-08 NOTE — Assessment & Plan Note (Signed)
Her cough is principally upper airway in nature.  She does have albuterol available to use as needed.

## 2023-06-08 NOTE — Progress Notes (Signed)
Subjective:    Patient ID: Kathleen Solis, female    DOB: [birth year], 57 y.o.   MRN: [id number]  HPI  ROV 02/03/23 --57 year old woman with a history of untreated OSA, upper airway irritation syndrome, chronic cough, possible mild mitten asthma with normal airflows on pulmonary function testing and hyperinflation on volumes.  She has underlying GERD, has not benefited from PPI in the past.  Bronchoscopy 08/2021 was reassuring but showed Prevotella (beta-lactamase positive).  Had been using fluticasone nasal spray.  Benefits from Promethazine DM cough syrup as well. She was having flaring cough when I saw her 1 month ago.  We added back loratadine to her fluticasone nasal spray, also tried empiric PPI again.  A CT chest was performed as below She reports today that her cough has not changed, was not impacted by restarting pantoprazole (she took for a week) or loratadine. Can happen at any time. Non-productive. She continues to have HA, saw Dr Everlena Cooper w Neurology. She has tried CPAP before but she continued to have HA even back then.   CT chest 01/31/2023 reviewed by me shows some subtle groundglass changes in the anterior medial left upper lobe. No significant mediastinal or hilar adenopathy final read is pending  ROV 06/08/23 --follow-up visit 57 year old woman with a history of prior diagnosis of OSA.  I follow her for this as well as chronic cough in the setting of upper airway irritation syndrome and possible mild intermittent asthma with normal airflows on her PFT but hyperinflation on volumes.  She has GERD that has not responded to PPI, allergic rhinitis.  Currently on Flonase, has albuterol available.  She has benefited some from Promethazine DM nightly.  She has some subtle patchy groundglass anterior medial left upper lobe infiltrate of unclear significance.  Colonized with Prevotella (beta-lactamase positive) on bronchoscopy 08/2021.  We decided to repeat her CT in 6 months which would be  December.  She underwent a home sleep study because she was willing to consider going back on CPAP.  Home sleep study 03/07/2023 shows AHI of 7.1/h, 22.4/h during REM sleep (AHI was 6.4/h in 2022)   Review of Systems  Constitutional:  Negative for fever and unexpected weight change.  HENT:  Negative for congestion, dental problem, ear pain, nosebleeds, postnasal drip, rhinorrhea, sinus pressure, sneezing, sore throat and trouble swallowing.   Eyes:  Negative for redness and itching.  Respiratory:  Positive for cough. Negative for chest tightness, shortness of breath and wheezing.   Cardiovascular:  Negative for palpitations and leg swelling.  Gastrointestinal:  Negative for nausea and vomiting.  Genitourinary:  Negative for dysuria.  Musculoskeletal:  Negative for joint swelling.  Skin:  Negative for rash.  Neurological:  Negative for headaches.  Hematological:  Does not bruise/bleed easily.  Psychiatric/Behavioral:  Negative for dysphoric mood. The patient is not nervous/anxious.        Objective:   Physical Exam Vitals:   06/08/23 0844  BP: (!) 132/90  Pulse: 66  SpO2: 97%  Weight: 195 lb 9.6 oz (88.7 kg)  Height: 5' 5.5" (1.664 m)    Gen: Pleasant, well-nourished, in no distress,  normal affect  ENT: No lesions,  mouth clear,  oropharynx clear, no postnasal drip, strong voice, no throat clearing  Neck: No JVD, no stridor  Lungs: No use of accessory muscles, no crackles or wheezing on normal respiration, no wheeze on forced expiration  Cardiovascular: RRR, heart sounds normal, no murmur or gallops, no peripheral edema  Musculoskeletal: No deformities, no  cyanosis or clubbing  Neuro: alert, awake, non focal  Skin: Warm, no lesions or rash      Assessment & Plan:  Obstructive sleep apnea She has mild OSA, documented again on this most recent split-night sleep study.  They did not get enough information to put CPAP on her in the night.  I will try AutoSet CPAP 5-20  cmH2O with heated humidity.  She needs a best fit mask evaluation and we will order this as well.  Follow-up in about 2 months to assess compliance and clinical benefit.  Mild asthma Her cough is principally upper airway in nature.  She does have albuterol available to use as needed.  Chronic cough With upper airway irritation.  She believes that her congestion, sinus fullness, headache ties into this.  She is currently on fluticasone nasal spray, 1 spray each nostril once daily.  Will increase this to 2 sprays.  She has benefited from the Promethazine DM, is fairly dependent on at least at night.  We will continue to fill for now  Abnormal CT of the chest Some subtle anterior groundglass noted.  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  Suspect that the cough is more upper airway in nature.  We're going to do a surveillance CT in December.   Levy Pupa, MD, PhD 06/08/2023, 9:08 AM [facility] [phone] or if no answer [phone]

## 2023-06-08 NOTE — Assessment & Plan Note (Addendum)
With upper airway irritation.  She believes that her congestion, sinus fullness, headache ties into this.  She is currently on fluticasone nasal spray, 1 spray each nostril once daily.  Will increase this to 2 sprays.  She has benefited from the Promethazine DM, is fairly dependent on at least at night.  We will continue to fill for now

## 2023-06-08 NOTE — Assessment & Plan Note (Addendum)
Some subtle anterior groundglass noted.  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  Suspect that the cough is more upper airway in nature.  We're going to do a surveillance CT in December.

## 2023-06-08 NOTE — Assessment & Plan Note (Signed)
She has mild OSA, documented again on this most recent split-night sleep study.  They did not get enough information to put CPAP on her in the night.  I will try AutoSet CPAP 5-20 cmH2O with heated humidity.  She needs a best fit mask evaluation and we will order this as well.  Follow-up in about 2 months to assess compliance and clinical benefit.

## 2023-06-15 ENCOUNTER — Telehealth: Payer: Self-pay | Admitting: Emergency Medicine

## 2023-06-15 ENCOUNTER — Telehealth: Payer: Self-pay | Admitting: Gastroenterology

## 2023-06-15 MED ORDER — FLUTICASONE PROPIONATE 50 MCG/ACT NA SUSP: 2.0000 | Freq: Every day | NASAL | 8 refills | Status: DC

## 2023-06-15 MED ORDER — PROMETHAZINE-DM 6.25-15 MG/5ML PO SYRP: 5.0000 mL | ORAL_SOLUTION | Freq: Four times a day (QID) | ORAL | 0 refills | Status: DC | PRN

## 2023-06-15 NOTE — Telephone Encounter (Signed)
PT states she needs refill of Flonase and cough syrup  [facility] is [facility] [address]  Dr. Delton Coombes on last visit said for her when needed.

## 2023-06-15 NOTE — Telephone Encounter (Signed)
Called and spoke to patient. She is requesting refill on flonase and Promethazine. I have refilled Flonase.  Dr. Delton Coombes, please advise on Promethazine refill. [facility]

## 2023-06-15 NOTE — Telephone Encounter (Signed)
Inbound call from patient stating for the past 2 weeks she has been having gurgling sounds in her stomach and she has not been feeling well. Patient requesting a call to discuss further. Please advise, thank you.

## 2023-06-15 NOTE — Telephone Encounter (Signed)
I have spoken to the pt and she states she has recently developed "gurgling" in her stomach. She has not been seen since 2021 and is not a new pt.  She was a patient of Dr Orvan Falconer. She has an appt for Jan with Hyacinth Meeker PA. She will keep that but also have an appt with her PCP to discuss. She will let us know if the PCP thinks she needs sooner appt.

## 2023-06-15 NOTE — Telephone Encounter (Signed)
Lm x1 for patient.  

## 2023-06-15 NOTE — Telephone Encounter (Signed)
I filled for her

## 2023-06-15 NOTE — Telephone Encounter (Signed)
PT ret Margie's call. Please try again.

## 2023-06-15 NOTE — Telephone Encounter (Signed)
Please call PT when you call it in at [phone]

## 2023-06-19 DIAGNOSIS — R1013 Epigastric pain: Secondary | ICD-10-CM | POA: Diagnosis not present

## 2023-06-19 DIAGNOSIS — K5909 Other constipation: Secondary | ICD-10-CM | POA: Diagnosis not present

## 2023-06-19 DIAGNOSIS — R198 Other specified symptoms and signs involving the digestive system and abdomen: Secondary | ICD-10-CM | POA: Diagnosis not present

## 2023-06-19 DIAGNOSIS — R110 Nausea: Secondary | ICD-10-CM | POA: Diagnosis not present

## 2023-06-20 DIAGNOSIS — G4733 Obstructive sleep apnea (adult) (pediatric): Secondary | ICD-10-CM | POA: Diagnosis not present

## 2023-06-23 DIAGNOSIS — G459 Transient cerebral ischemic attack, unspecified: Secondary | ICD-10-CM | POA: Diagnosis not present

## 2023-06-26 DIAGNOSIS — G459 Transient cerebral ischemic attack, unspecified: Secondary | ICD-10-CM | POA: Diagnosis not present

## 2023-06-30 ENCOUNTER — Ambulatory Visit: Payer: 59 | Admitting: Internal Medicine

## 2023-07-02 ENCOUNTER — Encounter: Payer: Self-pay | Admitting: Internal Medicine

## 2023-07-02 NOTE — Progress Notes (Unsigned)
Subjective:    Patient ID: Kathleen Solis, female    DOB: [birth year], 57 y.o.   MRN: [id number]     HPI Kathleen Solis is here for follow up of her chronic medical problems.  Headaches - often wake up with it - left posterior head.  Chronic.  Still getting them. Did see neuro.   She is working part time  Medications and allergies reviewed with patient and updated if appropriate.  Current Outpatient Medications on File Prior to Visit  Medication Sig Dispense Refill   acetaminophen (TYLENOL) 500 MG tablet Take 500 mg by mouth every 6 (six) hours as needed.     albuterol (VENTOLIN HFA) 108 (90 Base) MCG/ACT inhaler Inhale 2 puffs into the lungs every 6 (six) hours as needed for wheezing or shortness of breath. 8 g 6   aspirin EC 81 MG tablet Take 1 tablet (81 mg total) by mouth in the morning. Swallow whole.     atorvastatin (LIPITOR) 20 MG tablet Take 1 tablet (20 mg total) by mouth daily. 90 tablet 3   fluticasone (FLONASE) 50 MCG/ACT nasal spray Place 2 sprays into both nostrils daily. 16 g 8   hydrochlorothiazide (HYDRODIURIL) 25 MG tablet Take 1 tablet (25 mg total) by mouth daily. 90 tablet 2   metoprolol succinate (TOPROL-XL) 100 MG 24 hr tablet Take 1 tablet (100 mg total) by mouth at bedtime. 90 tablet 1   potassium chloride SA (KLOR-CON M) 20 MEQ tablet Take 1 tablet (20 mEq total) by mouth daily. 90 tablet 3   promethazine-dextromethorphan (PROMETHAZINE-DM) 6.25-15 MG/5ML syrup Take 5 mLs by mouth 4 (four) times daily as needed for cough. 240 mL 0   vitamin B-12 (CYANOCOBALAMIN) 50 MCG tablet Take 50 mcg by mouth daily.     colchicine 0.6 MG tablet Take 1 tablet (0.6 mg total) by mouth every 6 (six) hours as needed (gout pain). (Patient not taking: Reported on 07/03/2023) 60 tablet 0   No current facility-administered medications on file prior to visit.     Review of Systems  Constitutional:  Negative for fever.  Respiratory:  Positive for cough (chronic - dry). Negative  for shortness of breath and wheezing.   Cardiovascular:  Negative for chest pain, palpitations and leg swelling.  Neurological:  Positive for headaches (chronic). Negative for dizziness and light-headedness.       Objective:   Vitals:   07/03/23 0754  BP: 124/86  Pulse: 63  Temp: 98 F (36.7 C)  SpO2: 94%   BP Readings from Last 3 Encounters:  07/03/23 124/86  06/08/23 118/70  06/08/23 (!) 132/90   Wt Readings from Last 3 Encounters:  07/03/23 191 lb (86.6 kg)  06/08/23 195 lb (88.5 kg)  06/08/23 195 lb 9.6 oz (88.7 kg)   Body mass index is 31.3 kg/m.    Physical Exam Constitutional:      General: She is not in acute distress.    Appearance: Normal appearance.  HENT:     Head: Normocephalic and atraumatic.  Eyes:     Conjunctiva/sclera: Conjunctivae normal.  Cardiovascular:     Rate and Rhythm: Normal rate and regular rhythm.     Heart sounds: Normal heart sounds.  Pulmonary:     Effort: Pulmonary effort is normal. No respiratory distress.     Breath sounds: Normal breath sounds. No wheezing.  Musculoskeletal:     Cervical back: Neck supple.     Right lower leg: No edema.     Left  lower leg: No edema.  Lymphadenopathy:     Cervical: No cervical adenopathy.  Skin:    General: Skin is warm and dry.     Findings: No rash.  Neurological:     Mental Status: She is alert. Mental status is at baseline.  Psychiatric:        Mood and Affect: Mood normal.        Behavior: Behavior normal.        Lab Results  Component Value Date   WBC 9.0 04/28/2023   HGB 14.7 04/28/2023   HCT 42.5 04/28/2023   PLT 220.0 04/28/2023   GLUCOSE 89 04/28/2023   CHOL 158 12/28/2022   TRIG 111.0 12/28/2022   HDL 66.80 12/28/2022   LDLCALC 69 12/28/2022   ALT 15 04/28/2023   AST 19 04/28/2023   NA 140 04/28/2023   K 3.7 04/28/2023   CL 98 04/28/2023   CREATININE 0.75 04/28/2023   BUN 12 04/28/2023   CO2 34 (H) 04/28/2023   TSH 1.58 04/28/2023   INR 1.18 11/24/2015    HGBA1C 5.0 12/28/2022     Assessment & Plan:    See Problem List for Assessment and Plan of chronic medical problems.

## 2023-07-02 NOTE — Patient Instructions (Addendum)
       Medications changes include :   none     Return in about 6 months (around 12/31/2023) for Physical Exam.

## 2023-07-03 ENCOUNTER — Ambulatory Visit: Payer: 59 | Admitting: Internal Medicine

## 2023-07-03 ENCOUNTER — Ambulatory Visit (INDEPENDENT_AMBULATORY_CARE_PROVIDER_SITE_OTHER): Payer: 59 | Admitting: Internal Medicine

## 2023-07-03 VITALS — BP 124/86 | HR 63 | Temp 98.0°F | Ht 65.5 in | Wt 191.0 lb

## 2023-07-03 DIAGNOSIS — R519 Headache, unspecified: Secondary | ICD-10-CM

## 2023-07-03 DIAGNOSIS — Z8673 Personal history of transient ischemic attack (TIA), and cerebral infarction without residual deficits: Secondary | ICD-10-CM

## 2023-07-03 DIAGNOSIS — E876 Hypokalemia: Secondary | ICD-10-CM | POA: Diagnosis not present

## 2023-07-03 DIAGNOSIS — G8929 Other chronic pain: Secondary | ICD-10-CM | POA: Diagnosis not present

## 2023-07-03 DIAGNOSIS — E782 Mixed hyperlipidemia: Secondary | ICD-10-CM | POA: Diagnosis not present

## 2023-07-03 DIAGNOSIS — I6523 Occlusion and stenosis of bilateral carotid arteries: Secondary | ICD-10-CM | POA: Diagnosis not present

## 2023-07-03 DIAGNOSIS — Z8739 Personal history of other diseases of the musculoskeletal system and connective tissue: Secondary | ICD-10-CM | POA: Diagnosis not present

## 2023-07-03 DIAGNOSIS — I10 Essential (primary) hypertension: Secondary | ICD-10-CM | POA: Diagnosis not present

## 2023-07-03 LAB — LIPID PANEL
Cholesterol: 213 mg/dL — ABNORMAL HIGH (ref 0–200)
HDL: 53.5 mg/dL (ref >39.00)
LDL Cholesterol: 137 mg/dL — ABNORMAL HIGH (ref 0–99)
NonHDL: 159.92
Total CHOL/HDL Ratio: 4
Triglycerides: 113 mg/dL (ref 0.0–149.0)
VLDL: 22.6 mg/dL (ref 0.0–40.0)

## 2023-07-03 LAB — COMPREHENSIVE METABOLIC PANEL
ALT: 12 U/L (ref 0–35)
AST: 18 U/L (ref 0–37)
Albumin: 4.2 g/dL (ref 3.5–5.2)
Alkaline Phosphatase: 86 U/L (ref 39–117)
BUN: 15 mg/dL (ref 6–23)
CO2: 32 meq/L (ref 19–32)
Calcium: 9.8 mg/dL (ref 8.4–10.5)
Chloride: 98 meq/L (ref 96–112)
Creatinine, Ser: 0.82 mg/dL (ref 0.40–1.20)
GFR: 79.89 mL/min (ref >60.00)
Glucose, Bld: 104 mg/dL — ABNORMAL HIGH (ref 70–99)
Potassium: 3.5 meq/L (ref 3.5–5.1)
Sodium: 140 meq/L (ref 135–145)
Total Bilirubin: 0.8 mg/dL (ref 0.2–1.2)
Total Protein: 7.7 g/dL (ref 6.0–8.3)

## 2023-07-03 LAB — URIC ACID: Uric Acid, Serum: 7.3 mg/dL — ABNORMAL HIGH (ref 2.4–7.0)

## 2023-07-03 NOTE — Assessment & Plan Note (Signed)
Chronic Last [place] of carotid arteries were near normal Continue atorvastatin 20 mg daily

## 2023-07-03 NOTE — Assessment & Plan Note (Addendum)
Chronic Regular exercise and healthy diet encouraged Lab Results  Component Value Date   LDLCALC 69 12/28/2022   LDL at goal Check lipids Continue atorvastatin 20 mg daily

## 2023-07-03 NOTE — Assessment & Plan Note (Signed)
Chronic Left posterior head Did see Dr Everlena Cooper Imaging negative Neuro discussed considering nortriptyline or muscle relaxer She deferred above Continue tylenol as needed

## 2023-07-03 NOTE — Assessment & Plan Note (Signed)
Chronic Related to hydrochlorothiazide Not taking potassium currently Cmp - will see if she needs to restart KCL

## 2023-07-03 NOTE — Assessment & Plan Note (Addendum)
Chronic cmp Blood pressure well controlled Continue HCTZ 25 mg daily, metoprolol XL 100 mg daily

## 2023-07-03 NOTE — Assessment & Plan Note (Signed)
H/o CVA Continue ASA 81 mg daily, atorvastatin 20 mg daily  BP controlled Encouraged healthy diet, regular exercise

## 2023-07-03 NOTE — Assessment & Plan Note (Signed)
Chronic Had a flare a couple of months ago Has had 2 flares Will hold off on allopurinol for now unless she keeps getting flares Continue colchicine as needed

## 2023-07-04 ENCOUNTER — Telehealth: Payer: Self-pay | Admitting: Emergency Medicine

## 2023-07-04 NOTE — Telephone Encounter (Signed)
Cpap is causing patient Congestion

## 2023-07-06 NOTE — Telephone Encounter (Signed)
Cpap is causing patient Congestion , pls advise

## 2023-07-06 NOTE — Telephone Encounter (Signed)
Pt states her cpap is still giving her congestion, Dr. B please advise

## 2023-07-07 NOTE — Telephone Encounter (Signed)
Have her try adding loratadine 10mg  every day to her flonase She could try nasal saline as needed.  Also, find out if she is using humidity in her CPAP - may need to adjust this to see if it helps the congestion.

## 2023-07-19 ENCOUNTER — Telehealth: Payer: Self-pay | Admitting: Emergency Medicine

## 2023-07-19 NOTE — Telephone Encounter (Signed)
promethazine-dextromethorphan (PROMETHAZINE-DM) 6.25-15 MG/5ML syrup  [facility] [id number] - [place], [place] - [address]

## 2023-07-21 NOTE — Telephone Encounter (Signed)
Dr. Byrum, please advise. 

## 2023-07-23 DIAGNOSIS — G4733 Obstructive sleep apnea (adult) (pediatric): Secondary | ICD-10-CM | POA: Diagnosis not present

## 2023-07-24 ENCOUNTER — Other Ambulatory Visit: Payer: Self-pay | Admitting: Obstetrics and Gynecology

## 2023-07-24 DIAGNOSIS — Z1231 Encounter for screening mammogram for malignant neoplasm of breast: Secondary | ICD-10-CM

## 2023-07-24 NOTE — Telephone Encounter (Signed)
Okay to refill? 

## 2023-07-25 MED ORDER — PROMETHAZINE-DM 6.25-15 MG/5ML PO SYRP: 5.0000 mL | ORAL_SOLUTION | Freq: Four times a day (QID) | ORAL | 0 refills | Status: DC | PRN

## 2023-07-25 NOTE — Telephone Encounter (Signed)
Med has been refilled, pt has been contacted. She verbalized understanding nfn

## 2023-07-25 NOTE — Telephone Encounter (Signed)
Patient checking on message for cough syrup. Patient phone number is [phone].

## 2023-07-27 ENCOUNTER — Ambulatory Visit
Admission: RE | Admit: 2023-07-27 | Discharge: 2023-07-27 | Disposition: A | Discharge status: Home / Self Care | Payer: 59 | Source: Ambulatory Visit | Attending: Emergency Medicine | Admitting: Emergency Medicine

## 2023-07-27 DIAGNOSIS — R053 Chronic cough: Secondary | ICD-10-CM | POA: Diagnosis not present

## 2023-08-02 ENCOUNTER — Encounter: Payer: Self-pay | Admitting: Emergency Medicine

## 2023-08-02 ENCOUNTER — Ambulatory Visit: Payer: 59 | Admitting: Emergency Medicine

## 2023-08-02 VITALS — BP 126/84 | HR 68 | Temp 98.2°F | Ht 65.5 in | Wt 193.2 lb

## 2023-08-02 DIAGNOSIS — R053 Chronic cough: Secondary | ICD-10-CM | POA: Diagnosis not present

## 2023-08-02 DIAGNOSIS — J309 Allergic rhinitis, unspecified: Secondary | ICD-10-CM | POA: Diagnosis not present

## 2023-08-02 DIAGNOSIS — R040 Epistaxis: Secondary | ICD-10-CM | POA: Diagnosis not present

## 2023-08-02 DIAGNOSIS — G4733 Obstructive sleep apnea (adult) (pediatric): Secondary | ICD-10-CM

## 2023-08-02 DIAGNOSIS — R9389 Abnormal findings on diagnostic imaging of other specified body structures: Secondary | ICD-10-CM

## 2023-08-02 NOTE — Assessment & Plan Note (Signed)
Epistaxis Recent onset, possibly related to Flonase use. -Discontinue Flonase for 2-3 days to assess for improvement.

## 2023-08-02 NOTE — Assessment & Plan Note (Signed)
Obstructive Sleep Apnea Mild sleep apnea diagnosed by home sleep study (AHI 7.1/hr). Patient experienced nasal congestion and discomfort with auto-titration CPAP therapy. -Discontinue CPAP therapy at this time. Plan to revisit if symptoms worsen or patient's condition changes.

## 2023-08-02 NOTE — Progress Notes (Signed)
Subjective:    Patient ID: Jonna Munro, female    DOB: August 05, 1966, 57 y.o.   MRN: [id number]  HPI  ROV 08/02/2023 --  Ms Altherr, a 57 year old patient with a history of mild intermittent asthma, upper airway irritation syndrome, GERD, allergic rhinitis, and obstructive sleep apnea, presents for follow-up. The patient has been managed with Flonase for allergic rhinitis and promethazine DM for chronic cough, which she reports as being persistent. She also has albuterol available for use as needed.  She has a history of subtle patchy intermedial left upper lobe granulitis infiltrate, for which she underwent bronchoscopy in January 2023. The bronchoscopy revealed colonization with beta-lactamase positive Prevotella. A repeat CT scan of the chest was performed in December 2024 to monitor for interval stability.  In July, she had a home sleep study that showed an AHI of 7.1 per hour, higher during REM sleep. She was started on auto titration CPAP in October. However, she reports that the CPAP machine caused her to feel congested and stopped using it after a couple of days.  She also reports recent episodes of nose bleeding, which she has never experienced before. She is currently using Flonase, which she suspects might be causing the nose bleeding.  She is scheduled for an MRA at [facility] for neurology due to headaches. She believes these headaches might be related to mini strokes.   RADIOLOGY Chest CT: Subtle ground glass infiltrate in the intermedial left upper lobe has cleared; subtle basilar subpleural ground glass possibly reflecting atelectasis (07/27/2023)  DIAGNOSTIC Home sleep study: AHI of 7.1 per hour, higher during REM sleep (02/2023)  PATHOLOGY Bronchoscopy: Colonized with beta-lactamase positive Prevotella (08/2021)   Review of Systems  Constitutional:  Negative for fever and unexpected weight change.  HENT:  Negative for congestion, dental problem, ear pain, nosebleeds,  postnasal drip, rhinorrhea, sinus pressure, sneezing, sore throat and trouble swallowing.   Eyes:  Negative for redness and itching.  Respiratory:  Positive for cough. Negative for chest tightness, shortness of breath and wheezing.   Cardiovascular:  Negative for palpitations and leg swelling.  Gastrointestinal:  Negative for nausea and vomiting.  Genitourinary:  Negative for dysuria.  Musculoskeletal:  Negative for joint swelling.  Skin:  Negative for rash.  Neurological:  Negative for headaches.  Hematological:  Does not bruise/bleed easily.  Psychiatric/Behavioral:  Negative for dysphoric mood. The patient is not nervous/anxious.        Objective:   Physical Exam Vitals:   08/02/23 1323  BP: 126/84  Pulse: 68  Temp: 98.2 F (36.8 C)  TempSrc: Oral  SpO2: 94%  Weight: 193 lb 3.2 oz (87.6 kg)  Height: 5' 5.5" (1.664 m)    Gen: Pleasant, well-nourished, in no distress,  normal affect  ENT: No lesions,  mouth clear,  oropharynx clear, no postnasal drip, strong voice, no throat clearing  Neck: No JVD, no stridor  Lungs: No use of accessory muscles, no crackles or wheezing on normal respiration, no wheeze on forced expiration  Cardiovascular: RRR, heart sounds normal, no murmur or gallops, no peripheral edema  Musculoskeletal: No deformities, no cyanosis or clubbing  Neuro: alert, awake, non focal  Skin: Warm, no lesions or rash      Assessment & Plan:  Chronic cough Chronic Cough Persistent despite use of Flonase and promethazine DM as needed. -Continue promethazine DM as needed for cough. -Check expiration of albuterol and replace if necessary.    Epistaxis Epistaxis Recent onset, possibly related to Flonase use. -Discontinue  Flonase for 2-3 days to assess for improvement.  Allergic rhinitis Allergic Rhinitis Managed with Flonase, which will be temporarily discontinued due to recent epistaxis. -Reevaluate need for Flonase after 2-3 day discontinuation  period.  Obstructive sleep apnea Obstructive Sleep Apnea Mild sleep apnea diagnosed by home sleep study (AHI 7.1/hr). Patient experienced nasal congestion and discomfort with auto-titration CPAP therapy. -Discontinue CPAP therapy at this time. Plan to revisit if symptoms worsen or patient's condition changes.  Abnormal CT of the chest Pulmonary Infiltrate Resolved on recent CT scan (07/27/2023). -No further follow-up imaging required unless symptoms change.    Levy Pupa, MD, PhD 08/02/2023, 5:05 PM [facility] [phone] or if no answer [phone]

## 2023-08-02 NOTE — Assessment & Plan Note (Signed)
Pulmonary Infiltrate Resolved on recent CT scan (07/27/2023). -No further follow-up imaging required unless symptoms change.

## 2023-08-02 NOTE — Assessment & Plan Note (Signed)
Chronic Cough Persistent despite use of Flonase and promethazine DM as needed. -Continue promethazine DM as needed for cough. -Check expiration of albuterol and replace if necessary.

## 2023-08-02 NOTE — Assessment & Plan Note (Signed)
Allergic Rhinitis Managed with Flonase, which will be temporarily discontinued due to recent epistaxis. -Reevaluate need for Flonase after 2-3 day discontinuation period.

## 2023-08-02 NOTE — Patient Instructions (Signed)
  YOUR PLAN:  -EPISTAXIS: Epistaxis means nose bleeding. You should discontinue Flonase for 2-3 days to see if the nose bleeding improves.  -OBSTRUCTIVE SLEEP APNEA: Obstructive sleep apnea is a condition where breathing repeatedly stops and starts during sleep. Since you experienced nasal congestion and discomfort with the CPAP machine, you should discontinue its use for now. We will revisit this if your symptoms worsen or your condition changes.  -CHRONIC COUGH: Chronic cough is a cough that persists over a long period. Continue using promethazine DM as needed for your cough. Also, check the expiration date of your albuterol and replace it if necessary.  -PULMONARY INFILTRATE: Pulmonary infiltrate refers to a substance denser than air, such as pus, blood, or protein, which lingers within the lung tissues. Your recent CT scan showed that this issue has resolved, so no further imaging is required unless your symptoms change.  -ALLERGIC RHINITIS: Allergic rhinitis is an allergic reaction that causes sneezing, congestion, and a runny nose. Since you have experienced nose bleeding, temporarily discontinue Flonase for 2-3 days and then reevaluate its necessity.  INSTRUCTIONS:  Discontinue Flonase for 2-3 days to see if the nose bleeding improves. Continue using promethazine DM as needed for your cough and check the expiration date of your albuterol. Discontinue CPAP therapy for now and we will revisit this if your symptoms worsen or your condition changes. No further follow-up imaging is required for the pulmonary infiltrate unless your symptoms change. You are scheduled for an MRA at [facility] for your headaches.

## 2023-08-07 ENCOUNTER — Telehealth: Payer: Self-pay | Admitting: Emergency Medicine

## 2023-08-08 DIAGNOSIS — Z1152 Encounter for screening for COVID-19: Secondary | ICD-10-CM | POA: Diagnosis not present

## 2023-08-08 DIAGNOSIS — J029 Acute pharyngitis, unspecified: Secondary | ICD-10-CM | POA: Diagnosis not present

## 2023-08-08 DIAGNOSIS — R051 Acute cough: Secondary | ICD-10-CM | POA: Diagnosis not present

## 2023-08-08 DIAGNOSIS — R0981 Nasal congestion: Secondary | ICD-10-CM | POA: Diagnosis not present

## 2023-08-15 DIAGNOSIS — G459 Transient cerebral ischemic attack, unspecified: Secondary | ICD-10-CM | POA: Diagnosis not present

## 2023-08-15 DIAGNOSIS — G379 Demyelinating disease of central nervous system, unspecified: Secondary | ICD-10-CM | POA: Diagnosis not present

## 2023-08-15 DIAGNOSIS — I6522 Occlusion and stenosis of left carotid artery: Secondary | ICD-10-CM | POA: Diagnosis not present

## 2023-08-21 NOTE — Telephone Encounter (Signed)
I called the patient to review her CT scan, answer her questions.  No answer.  Will try to call her again.

## 2023-08-28 ENCOUNTER — Telehealth: Payer: Self-pay | Admitting: Emergency Medicine

## 2023-08-28 MED ORDER — PROMETHAZINE-DM 6.25-15 MG/5ML PO SYRP: 5.0000 mL | ORAL_SOLUTION | Freq: Four times a day (QID) | ORAL | 0 refills | Status: DC | PRN

## 2023-08-28 NOTE — Telephone Encounter (Signed)
 I called pt informing her of med refill. Pt is scheduled for f/u with Dr Delton Coombes. Pt verbalized understanding,. NFN

## 2023-08-28 NOTE — Telephone Encounter (Signed)
 I called pt to verify rx request. Pt needs a refill on the Promethazine  cough syrup. Per lov on 08-02-2023, pt can continue rx as needed, I told the pt I would send in 1 refill. Pt verbalized understanding. Pt also states that Dr Shelah tried to call her in regards to her CT results. I told the pt I would relay a message for Dr Shelah to call pt when he is in office. Pt needs a 6 month f/u with By[facility]. Forwarding to front desk for scheduling. Pt verbalized understanding. Nothing else needed.

## 2023-08-28 NOTE — Telephone Encounter (Signed)
 Patient states needs refill for cough syrup. [facility]. Patient phone number is [phone].

## 2023-08-28 NOTE — Telephone Encounter (Signed)
 Please see last signed encounter. PT would like call back. Confused about something. Her # is [phone]

## 2023-08-28 NOTE — Telephone Encounter (Signed)
 Called patient and left detailed VM (DPR) that Dr. Delton Coombes had sent refill tp pharmacy.  Call back number given for any questions and concerns.  Nothing further at this time.

## 2023-08-28 NOTE — Telephone Encounter (Signed)
 I refilled it for her.  Agree she needs to follow in 6 months after her most recent OV.  Thanks

## 2023-08-28 NOTE — Telephone Encounter (Signed)
 I called pt back. The medication, Promethazine, was marked as discontinued. Sending to Dr Delton Coombes to view and determine refill or not. Pt verbalized understanding. NFN.

## 2023-08-29 NOTE — Telephone Encounter (Signed)
 LMTCB X1

## 2023-09-08 ENCOUNTER — Ambulatory Visit
Admission: RE | Admit: 2023-09-08 | Discharge: 2023-09-08 | Disposition: A | Discharge status: Home / Self Care | Payer: Medicare PPO | Source: Ambulatory Visit | Attending: Obstetrics and Gynecology | Admitting: Obstetrics and Gynecology

## 2023-09-08 DIAGNOSIS — Z1231 Encounter for screening mammogram for malignant neoplasm of breast: Secondary | ICD-10-CM

## 2023-09-15 NOTE — Telephone Encounter (Signed)
NFN

## 2023-09-15 NOTE — Telephone Encounter (Signed)
Spoke with a nurse 1/6 at 430.

## 2023-09-20 DIAGNOSIS — K2950 Unspecified chronic gastritis without bleeding: Secondary | ICD-10-CM | POA: Diagnosis not present

## 2023-09-20 DIAGNOSIS — K3189 Other diseases of stomach and duodenum: Secondary | ICD-10-CM | POA: Diagnosis not present

## 2023-09-20 DIAGNOSIS — I2510 Atherosclerotic heart disease of native coronary artery without angina pectoris: Secondary | ICD-10-CM | POA: Diagnosis not present

## 2023-09-20 DIAGNOSIS — K259 Gastric ulcer, unspecified as acute or chronic, without hemorrhage or perforation: Secondary | ICD-10-CM | POA: Diagnosis not present

## 2023-09-20 DIAGNOSIS — K209 Esophagitis, unspecified without bleeding: Secondary | ICD-10-CM | POA: Diagnosis not present

## 2023-09-20 DIAGNOSIS — R1013 Epigastric pain: Secondary | ICD-10-CM | POA: Diagnosis not present

## 2023-09-21 ENCOUNTER — Ambulatory Visit: Payer: 59 | Admitting: Physician Assistant

## 2023-09-24 ENCOUNTER — Other Ambulatory Visit: Payer: Self-pay | Admitting: Internal Medicine

## 2023-10-01 DIAGNOSIS — R0989 Other specified symptoms and signs involving the circulatory and respiratory systems: Secondary | ICD-10-CM | POA: Diagnosis not present

## 2023-10-01 DIAGNOSIS — R059 Cough, unspecified: Secondary | ICD-10-CM | POA: Diagnosis not present

## 2023-10-01 DIAGNOSIS — R0789 Other chest pain: Secondary | ICD-10-CM | POA: Diagnosis not present

## 2023-10-01 DIAGNOSIS — R519 Headache, unspecified: Secondary | ICD-10-CM | POA: Diagnosis not present

## 2023-10-02 ENCOUNTER — Ambulatory Visit: Payer: Self-pay | Admitting: Internal Medicine

## 2023-10-02 DIAGNOSIS — R1013 Epigastric pain: Secondary | ICD-10-CM | POA: Diagnosis not present

## 2023-10-02 DIAGNOSIS — K219 Gastro-esophageal reflux disease without esophagitis: Secondary | ICD-10-CM | POA: Diagnosis not present

## 2023-10-02 DIAGNOSIS — R0981 Nasal congestion: Secondary | ICD-10-CM | POA: Diagnosis not present

## 2023-10-02 DIAGNOSIS — R110 Nausea: Secondary | ICD-10-CM | POA: Diagnosis not present

## 2023-10-02 DIAGNOSIS — Z1211 Encounter for screening for malignant neoplasm of colon: Secondary | ICD-10-CM | POA: Diagnosis not present

## 2023-10-02 DIAGNOSIS — R509 Fever, unspecified: Secondary | ICD-10-CM | POA: Diagnosis not present

## 2023-10-02 DIAGNOSIS — K5909 Other constipation: Secondary | ICD-10-CM | POA: Diagnosis not present

## 2023-10-02 DIAGNOSIS — K2970 Gastritis, unspecified, without bleeding: Secondary | ICD-10-CM | POA: Diagnosis not present

## 2023-10-02 DIAGNOSIS — R051 Acute cough: Secondary | ICD-10-CM | POA: Diagnosis not present

## 2023-10-02 NOTE — Telephone Encounter (Signed)
 Copied from CRM [id number]. Topic: Clinical - Red Word Triage >> Oct 02, 2023 11:05 AM Kathleen Solis wrote: Red Word that prompted transfer to Nurse Triage: Patient is having flu like symptoms: cough, congestion, fever, runny nose body aches and chills. It all started yesterday   Chief Complaint: flu-like symptoms Symptoms: chest pain possibly due to coughing, runny nose, body aches, dry cough Frequency: yesterday Pertinent Negatives: Patient denies shortness of breath or difficulty breathing  Disposition: [] ED /[] Urgent Care (no appt availability in office) / [x] Appointment(In office/virtual)/ []  [facility]/ [] Home Care/ [x] Refused Recommended Disposition /[] [facility]/ []  Follow-up with PCP Additional Notes: The patient reported that beginning yesterday morning, she has had a runny nose, headache at the back of her head, body aches, cold sweats, nausea and chest pain that feels like pressure that she thinks is related to her ongoing cough. She has had an ongoing dry cough for 1 week that her pulmonologist prescribed cough medicine for however, the cough has not resolved.  She has a history of asthma but denies breathing difficulty.  She was offered a same day appointment however she did not prefer the appointment time and opted to go to urgent care for earlier assessment. Reason for Disposition  [1] Continuous (nonstop) coughing interferes with work or school AND [2] no improvement using cough treatment per Care Advice  Answer Assessment - Initial Assessment Questions 1. LOCATION: "Where does it hurt?"       Middle  2. RADIATION: "Does the pain go anywhere else?" (e.g., into neck, jaw, arms, back)     None  3. ONSET: "When did the chest pain begin?" (Minutes, hours or days)      Yesterday morning  4. PATTERN: "Does the pain come and go, or has it been constant since it started?"  "Does it get worse with exertion?"      Constant  5. DURATION: "How long does it last" (e.g.,  seconds, minutes, hours)      6. SEVERITY: "How bad is the pain?"  (e.g., Scale 1-10; mild, moderate, or severe)    - MILD (1-3): doesn't interfere with normal activities     - MODERATE (4-7): interferes with normal activities or awakens from sleep    - SEVERE (8-10): excruciating pain, unable to do any normal activities       Pressure possibly due to coughing  7. CARDIAC RISK FACTORS: "Do you have any history of heart problems or risk factors for heart disease?" (e.g., angina, prior heart attack; diabetes, high blood pressure, high cholesterol, smoker, or strong family history of heart disease)     TIA 8. PULMONARY RISK FACTORS: "Do you have any history of lung disease?"  (e.g., blood clots in lung, asthma, emphysema, birth control pills)     Asthma  9. CAUSE: "What do you think is causing the chest pain?"     Possibly due to coughing  10. OTHER SYMPTOMS: "Do you have any other symptoms?" (e.g., dizziness, nausea, vomiting, sweating, fever, difficulty breathing, cough)       Chest hurting, headache, cold sweats, runny nose, nausea  Answer Assessment - Initial Assessment Questions 1. ONSET: "When did the cough begin?"      Ongoing 1 week 2. SEVERITY: "How bad is the cough today?"      Worsened  3. SPUTUM: "Describe the color of your sputum" (none, dry cough; clear, white, yellow, green)     None  4. HEMOPTYSIS: "Are you coughing up any blood?" If so  ask: "How much?" (flecks, streaks, tablespoons, etc.)     None  5. DIFFICULTY BREATHING: "Are you having difficulty breathing?" If Yes, ask: "How bad is it?" (e.g., mild, moderate, severe)    - MILD: No SOB at rest, mild SOB with walking, speaks normally in sentences, can lie down, no retractions, pulse < 100.    - MODERATE: SOB at rest, SOB with minimal exertion and prefers to sit, cannot lie down flat, speaks in phrases, mild retractions, audible wheezing, pulse 100-120.    - SEVERE: Very SOB at rest, speaks in single words, struggling to  breathe, sitting hunched forward, retractions, pulse > 120      No 6. FEVER: "Do you have a fever?" If Yes, ask: "What is your temperature, how was it measured, and when did it start?"     No 7. CARDIAC HISTORY: "Do you have any history of heart disease?" (e.g., heart attack, congestive heart failure)      TIA 8. LUNG HISTORY: "Do you have any history of lung disease?"  (e.g., pulmonary embolus, asthma, emphysema)     asthma10. OTHER SYMPTOMS: "Do you have any other symptoms?" (e.g., runny nose, wheezing, chest pain)       Chest hurting possibly due to cough, headache, cold sweats, runny nose, nausea  Protocols used: Cough - Acute Non-Productive-A-AH, Chest Pain-A-AH

## 2023-10-10 ENCOUNTER — Telehealth: Payer: Self-pay | Admitting: Emergency Medicine

## 2023-10-10 NOTE — Telephone Encounter (Signed)
Patient needs cough syrup refilled.   [facility]

## 2023-10-13 MED ORDER — PROMETHAZINE-DM 6.25-15 MG/5ML PO SYRP: 5.0000 mL | ORAL_SOLUTION | Freq: Four times a day (QID) | ORAL | 0 refills | Status: DC | PRN

## 2023-10-13 NOTE — Telephone Encounter (Signed)
Called and spoke to patient. She is requesting refill on promethazine DM for persistent dry cough. Last refilled 08/28/2023 #[id number]. Denied SOB, wheezing, f/c/s or additional sx.  [facility]erred pharmacy [facility] [address] [facility] [address].   RB, please advise. Thanks

## 2023-10-13 NOTE — Telephone Encounter (Signed)
 Lm for patient.

## 2023-10-13 NOTE — Telephone Encounter (Signed)
 I refilled this for her.

## 2023-10-13 NOTE — Telephone Encounter (Signed)
 Lm x1 for patient.

## 2023-10-13 NOTE — Telephone Encounter (Signed)
ATC patient x2.  Left detailed message per DPR regarding refill of cough medication.  Advised to call back with any questions.

## 2023-10-15 NOTE — Telephone Encounter (Signed)
Due to multiple attempts trying to reach pt without being able to do so, per protocol encounter will be closed. 

## 2023-10-16 ENCOUNTER — Ambulatory Visit: Payer: Self-pay | Admitting: Internal Medicine

## 2023-10-16 NOTE — Telephone Encounter (Signed)
  Chief Complaint: L leg burning Symptoms: burning, numbness Frequency: 4 days Pertinent Negatives: Patient denies CP, SOB, swelling, weakness Disposition: [] ED /[] Urgent Care (no appt availability in office) / [x] Appointment(In office/virtual)/ []  [facility]/ [] Home Care/ [x] Refused Recommended Disposition /[] [facility]/ []  Follow-up with PCP Additional Notes: Patient calls reporting a burning sensation and numbness of L leg. States she has had numbness previously, but never burning sensation, described as "hot like I am peeing down my leg". Per protocol, patient to be evaluated within 4 hours. First available appointment with PCP is outside of guideline. Patient offered appt in alternate clinic, requesting to see PCP only. Care advice reviewed, patient verbalized understanding, but is requesting callback from PCP and appt tmw. Alerting PCP for review and follow up.    Reason for Disposition  [1] Thigh or calf pain AND [2] only 1 side AND [3] present > 1 hour (Exception: Chronic unchanged pain.)  Answer Assessment - Initial Assessment Questions 1. ONSET: "When did the pain start?"      4 days 2. LOCATION: "Where is the pain located?"      L leg into L foot, burning sensation, worse when standing- inner thigh down 3. PAIN: "How bad is the pain?"    (Scale 1-10; or mild, moderate, severe)   -  MILD (1-3): doesn't interfere with normal activities    -  MODERATE (4-7): interferes with normal activities (e.g., work or school) or awakens from sleep, limping    -  SEVERE (8-10): excruciating pain, unable to do any normal activities, unable to walk     Denies 4. WORK OR EXERCISE: "Has there been any recent work or exercise that involved this part of the body?"      Denies 5. CAUSE: "What do you think is causing the leg pain?"     Unsure if it is related to back pain 6. OTHER SYMPTOMS: "Do you have any other symptoms?" (e.g., chest pain, back pain, breathing difficulty,  swelling, rash, fever, numbness, weakness)     Feels like peeing down leg, warm feeling down leg.  Protocols used: Leg Pain-A-AH

## 2023-10-17 ENCOUNTER — Ambulatory Visit (INDEPENDENT_AMBULATORY_CARE_PROVIDER_SITE_OTHER): Payer: 59 | Admitting: Internal Medicine

## 2023-10-17 ENCOUNTER — Encounter: Payer: Self-pay | Admitting: Internal Medicine

## 2023-10-17 ENCOUNTER — Ambulatory Visit (INDEPENDENT_AMBULATORY_CARE_PROVIDER_SITE_OTHER)
Admission: RE | Admit: 2023-10-17 | Discharge: 2023-10-17 | Disposition: A | Discharge status: Home / Self Care | Payer: 59 | Source: Ambulatory Visit | Attending: Internal Medicine | Admitting: Internal Medicine

## 2023-10-17 VITALS — BP 106/72 | HR 69 | Temp 98.6°F | Ht 65.5 in | Wt 195.4 lb

## 2023-10-17 DIAGNOSIS — R202 Paresthesia of skin: Secondary | ICD-10-CM | POA: Diagnosis not present

## 2023-10-17 DIAGNOSIS — M545 Low back pain, unspecified: Secondary | ICD-10-CM | POA: Diagnosis not present

## 2023-10-17 DIAGNOSIS — R200 Anesthesia of skin: Secondary | ICD-10-CM

## 2023-10-17 DIAGNOSIS — M47816 Spondylosis without myelopathy or radiculopathy, lumbar region: Secondary | ICD-10-CM | POA: Diagnosis not present

## 2023-10-17 DIAGNOSIS — J069 Acute upper respiratory infection, unspecified: Secondary | ICD-10-CM

## 2023-10-17 DIAGNOSIS — M549 Dorsalgia, unspecified: Secondary | ICD-10-CM | POA: Diagnosis not present

## 2023-10-17 LAB — POCT INFLUENZA A/B
Influenza A, POC: NEGATIVE
Influenza B, POC: NEGATIVE

## 2023-10-17 LAB — POC COVID19 BINAXNOW: SARS Coronavirus 2 Ag: NEGATIVE

## 2023-10-17 NOTE — Progress Notes (Signed)
 [facility] [address] [place] [address] Dept: [phone] Dept Fax: [phone]  Acute Care Office Visit  Subjective:   Kathleen Solis 02-Sep-1965 10/17/2023  Chief Complaint  Patient presents with   Back Pain    Moving towards feet started Friday    Headache   Sore Throat    Noticed last night     HPI: Discussed the use of AI scribe software for clinical note transcription with the patient, who gave verbal consent to proceed.  History of Present Illness   The patient, with a history of TIAs, lumbar radiculopathy, and degenerative disc disease in the cervical spine (C5, C6), presents with a new "warm sensation" in the left leg and lower back pain.  The sensation, extends from the thigh down to the foot. The patient first noticed the sensation while standing at work 5 days ago and it has persisted despite movement and rest. Has not worsened in intensity. Denies any recent injuries or falls. She denies any associated bowel/bladder incontinence, saddle paresthesia, numbness in lower extremities, slurred speech, vision changes, motor weakness. She has chronic back pain, and reports pain is not worse than her normal pain.   In addition to the leg sensation, the patient reports a headache, runny nose, and throat discomfort that started the previous night. She has been working in a public setting and is concerned about potential exposure to illness. She also reports a chronic cough managed by a pulmonologist for asthma. She states her ears feel "full."       The following portions of the patient's history were reviewed and updated as appropriate: past medical history, past surgical history, family history, social history, allergies, medications, and problem list.   Patient Active Problem List   Diagnosis Date Noted   Epistaxis 08/02/2023   Abnormal CT of the chest 06/08/2023   Intractable headache 04/28/2023   Fatigue  04/28/2023   Left leg numbness 02/14/2023   Hypokalemia 02/14/2023   Hair loss 12/28/2022   Chronic headaches 12/15/2022   History of gout 06/23/2022   High-tone pelvic floor dysfunction 10/27/2021   Primary stress urinary incontinence 10/27/2021   Obstructive sleep apnea 08/06/2021   History of COVID-19 05/18/2021   Urinary hesitancy 12/31/2020   MCI (mild cognitive impairment) with memory loss 12/31/2020   Midline cystocele    Mild asthma 08/27/2020   Lumbar radiculopathy 05/15/2018   Chronic cough 05/08/2018   Vitamin B12 deficiency 02/23/2018   Onychomycosis 02/21/2018   Hyperlipidemia 02/14/2017   Hyperglycemia 02/14/2017   Allergic rhinitis 08/16/2016   Carotid arterial disease 04/10/2016   Diverticulitis of colon without hemorrhage 02/25/2016   H/O: CVA (cerebrovascular accident)    Benign essential hypertension 03/03/2015   Cyst of left ovary 03/03/2015   Past Medical History:  Diagnosis Date   Allergic rhinitis 08/16/2016   Benign essential hypertension 03/03/2015   Bone spur of right foot 02/08/2016   Bunion of left foot 02/08/2016   Carotid arterial disease 04/10/2016   03/2016: right 40-59%, left < 40%; 10/2020 carotid US: No evidence of B ICA stenosis   Cerebrovascular accident (CVA)    MRI - Chronic hyperintensity left frontal parietal white matter unchanged likely due to chronic ischemia.   Chest pain 02/14/2017   Cyst of left ovary 03/03/2015   Diverticulitis of colon without hemorrhage 02/25/2016   Epigastric pain 05/06/2020   Episodic tension-type headache 01/12/2016   Taking motrin 200 mg bid   GERD (gastroesophageal reflux disease)  High-tone pelvic floor dysfunction 10/27/2021   History of COVID-19 05/18/2021   History of gout 06/23/2022   Hyperglycemia 02/14/2017   Chronic; encouraged low sugar/carbohydrate diet and regular exercise   Hyperlipidemia    diet controlled - no med   Left upper quadrant pain    Leg neuralgia, right    Related to  CVA    Lower respiratory infection 07/18/2018   Lumbar back pain 05/10/2018   Lumbar radiculopathy 05/15/2018   Pain seems consistent with radiculopathy   MCI (mild cognitive impairment) with memory loss 12/31/2020   Midline cystocele    Mild asthma 08/27/2020   no inhaler   Obstructive sleep apnea 08/06/2021   Intolerant of cpap   Onychomycosis 02/21/2018   Toenails; Ciclopirox prescribed   Oropharyngeal dysphagia 08/16/2016   Persistent cough 03/17/2019   Primary stress urinary incontinence 10/27/2021   Rib pain    Sciatica    right   Sinus headache 12/15/2022   Sinus infection 03/22/2019   SVD (spontaneous vaginal delivery)    x 2   Tinnitus of right ear 11/16/2016   no current problem as of 08/26/21 per patient   Urinary hesitancy 12/31/2020   Viral respiratory infection 12/04/2019   Vitamin B12 deficiency 02/23/2018   Past Surgical History:  Procedure Laterality Date   ABDOMINAL HYSTERECTOMY     partial   ANAL FISSURE REPAIR     APPENDECTOMY     BRONCHIAL WASHINGS  08/30/2021   Procedure: BRONCHIAL WASHINGS;  Surgeon: Leslye Peer, MD;  Location: [facility] END[facility]COPY;  Service: Cardiopulmonary;;   CHOLECYSTECTOMY     CYSTOSCOPY N/A 10/04/2017   Procedure: Bluford Kaufmann;  Surgeon: Gerald Leitz, MD;  Location: [facility] ORS;  Service: Gynecology;  Laterality: N/A;   FOOT SURGERY Left    big toe bunion removed   LAPAROSCOPIC BILATERAL SALPINGO OOPHERECTOMY Bilateral 10/04/2017   Procedure: LAPAROSCOPIC BILATERAL SALPINGO OOPHORECTOMY, PELVIC WASHINGS;  Surgeon: Gerald Leitz, MD;  Location: [facility];  Service: Gynecology;  Laterality: Bilateral;   SACRAL COLPOPEXY ROBOTIC, posterior repair, cystoscopy  07/04/2019   @ [facility]   TUBAL LIGATION     VIDEO BRONCHOSCOPY N/A 08/30/2021   Procedure: VIDEO BRONCHOSCOPY WITHOUT FLUORO;  Surgeon: Leslye Peer, MD;  Location: [facility] ENDOSCOPY;  Service: Cardiopulmonary;  Laterality: N/A;   WISDOM TOOTH EXTRACTION     Family History  Problem Relation Age of  Onset   Breast cancer Sister    Diabetes Sister    Glaucoma Sister    Cataracts Sister    Hypertension Sister    Anuerysm Mother 82   Pancreatic cancer Father    Retinal detachment Brother    Cataracts Sister    Breast cancer Cousin    Anuerysm Cousin    Breast cancer Niece    Esophageal cancer Nephew    Developmental delay Nephew    Cerebral palsy Son        mild   Colon polyps Neg Hx    Prostate cancer Neg Hx    Rectal cancer Neg Hx    Stomach cancer Neg Hx    Uterine cancer Neg Hx     Current Outpatient Medications:    acetaminophen (TYLENOL) 500 MG tablet, Take 500 mg by mouth every 6 (six) hours as needed., Disp: , Rfl:    albuterol (VENTOLIN HFA) 108 (90 Base) MCG/ACT inhaler, Inhale 2 puffs into the lungs every 6 (six) hours as needed for wheezing or shortness of breath., Disp: 8 g, Rfl: 6   aspirin EC 81  MG tablet, Take 1 tablet (81 mg total) by mouth in the morning. Swallow whole., Disp: , Rfl:    atorvastatin (LIPITOR) 20 MG tablet, Take 1 tablet (20 mg total) by mouth daily., Disp: 90 tablet, Rfl: 3   fluticasone (FLONASE) 50 MCG/ACT nasal spray, Place 2 sprays into both nostrils daily., Disp: 16 g, Rfl: 8   hydrochlorothiazide (HYDRODIURIL) 25 MG tablet, Take 1 tablet (25 mg total) by mouth daily., Disp: 90 tablet, Rfl: 2   metoprolol succinate (TOPROL-XL) 100 MG 24 hr tablet, TAKE 1 TABLET BY MOUTH AT BEDTIME, Disp: 90 tablet, Rfl: 1   potassium chloride SA (KLOR-CON M) 20 MEQ tablet, Take 1 tablet (20 mEq total) by mouth daily., Disp: 90 tablet, Rfl: 3   promethazine-dextromethorphan (PROMETHAZINE-DM) 6.25-15 MG/5ML syrup, Take 5 mLs by mouth 4 (four) times daily as needed for cough., Disp: 240 mL, Rfl: 0   vitamin B-12 (CYANOCOBALAMIN) 50 MCG tablet, Take 50 mcg by mouth daily., Disp: , Rfl:    colchicine 0.6 MG tablet, Take 1 tablet (0.6 mg total) by mouth every 6 (six) hours as needed (gout pain). (Patient not taking: Reported on 10/17/2023), Disp: 60 tablet, Rfl:  0 Allergies  Allergen Reactions   Benzonatate Other (See Comments)    Headaches   Darvon [Propoxyphene] Other (See Comments)    Gi problems   Ibuprofen     tinnitus   Morphine And Codeine Itching   Prednisone Other (See Comments)    Make her head hurt   Sulfa Antibiotics Hives   Tussionex Pennkinetic Er [Hydrocod Poli-Chlorphe Poli Er] Other (See Comments)    Nausea, vomiting   Buprenorphine Hcl Itching   Ivp Dye [Iodinated Contrast Media] Nausea And Vomiting   Prochlorperazine Edisylate Itching and Other (See Comments)    "Acts weird: Hallucinations"     ROS: A complete ROS was performed with pertinent positives/negatives noted in the HPI. The remainder of the ROS are negative.    Objective:   Today's Vitals   10/17/23 1403  BP: 106/72  Pulse: 69  Temp: 98.6 F (37 C)  TempSrc: Temporal  SpO2: 98%  Weight: 195 lb 6.4 oz (88.6 kg)  Height: 5' 5.5" (1.664 m)    GENERAL: Well-appearing, in NAD. Well nourished.  SKIN: Pink, warm and dry. No rash, lesion, ulceration, or ecchymoses.  HEENT:    HEAD: Normocephalic, non-traumatic.  EYES: Conjunctive pink without exudate.  EARS: External ear w/o redness, swelling, masses, or lesions. EAC clear. TM's intact, translucent w/o bulging, appropriate landmarks visualized.  NOSE: Septum midline w/o deformity. Nares patent, mucosa pink and inflamed with clear drainage. No sinus tenderness.  THROAT: Uvula midline. Oropharynx clear. Tonsils non-inflamed w/o exudate. Mucus membranes pink and moist.  NECK: Trachea midline. Full ROM w/o pain or tenderness. No lymphadenopathy.  RESPIRATORY: Chest wall symmetrical. Respirations even and non-labored. Breath sounds clear to auscultation bilaterally.  CARDIAC: S1, S2 present, regular rate and rhythm. Peripheral pulses 2+ bilaterally.  EXTREMITIES: Without clubbing, cyanosis, or edema.  NEUROLOGIC: 5/5 upper and lower extremity strength. CN 2-12 intact. No sensory deficits with light touch.  Steady, even gait.  PSYCH/MENTAL STATUS: Alert, oriented x 3. Cooperative, appropriate mood and affect.    Results for orders placed or performed in visit on 10/17/23  POCT Influenza A/B  Result Value Ref Range   Influenza A, POC Negative Negative   Influenza B, POC Negative Negative  POC COVID-19 BinaxNow  Result Value Ref Range   SARS Coronavirus 2 Ag Negative Negative  Assessment & Plan:  Assessment and Plan    Back Pain - ongoing chronic back pain, with new onset of "warm sensation" to left leg  No associated incontinence, numbness, motor weakness. -Order lumbar x-ray to evaluate for possible lumbar spine pathology. -Advise patient to seek emergency care if symptoms worsen or if new symptoms such as incontinence develop.  URI New onset of runny nose, sore throat, and headache. No fever or cough. COVID and flu tests negative. -Recommend over-the-counter supportive care including Tylenol as needed, Coricidin HBP, cough drops, and warm salt water gargles. -Follow up if symptoms worsen or fail to improve.  No orders of the defined types were placed in this encounter.  Orders Placed This Encounter  Procedures   DG Lumbar Spine Complete    Standing Status:   Future    Number of Occurrences:   1    Expiration Date:   10/16/2024    Reason for Exam (SYMPTOM  OR DIAGNOSIS REQUIRED):   lumbar back pain , warm sensation reported by patient to left lower extremity    Is patient pregnant?:   No    Preferred imaging location?:   Internal   POCT Influenza A/B   POC COVID-19 BinaxNow    Previously tested for COVID-19:   No    Resident in a congregate (group) care setting:   No    Employed in healthcare setting:   No    Pregnant:   No   Lab Orders         POCT Influenza A/B         POC COVID-19 BinaxNow     No images are attached to the encounter or orders placed in the encounter.  Return if symptoms worsen or fail to improve.   Salvatore Decent, FNP

## 2023-10-17 NOTE — Telephone Encounter (Signed)
 Spoke with patient yesterday and offered her an appointment to be seen with Judeth Cornfield today.  Patient declined and only wanted to see Dr. Lawerance Bach or an MD.  I did let patient know that Judeth Cornfield was good and quite capable of taking good care of her.  She declined and asked if she could be seen at another practice.  Appointment scheduled for her today.

## 2023-10-17 NOTE — Patient Instructions (Signed)
 Rest, drink plenty of fluids.  Tylenol for fever, body aches.   For cough: Take Mucinex DM or Robitussin-DM over-the-counter.  Follow the instructions in the box.  For nasal and sinus congestion: Use over-the-counter Flonase: 2 nasal sprays on each side of the nose in the morning until you feel better  Use over-the-counter Astepro 2 nasal sprays on each side of the nose twice daily until better  Patients without high blood pressure: can use decongestants such as pseudoephedrine or phenylephrine no more than 3-5 days.   Patients with high blood pressure can use Coricidin HBP for decongestant, it will not raise your blood pressure.   Call if not gradually better over the next  10 days  Call anytime if the symptoms are severe, you have high fever, short of breath, chest pain

## 2023-10-18 ENCOUNTER — Ambulatory Visit: Payer: 59 | Admitting: Family Medicine

## 2023-10-19 ENCOUNTER — Encounter: Payer: Self-pay | Admitting: Internal Medicine

## 2023-10-19 DIAGNOSIS — M722 Plantar fascial fibromatosis: Secondary | ICD-10-CM | POA: Diagnosis not present

## 2023-10-19 DIAGNOSIS — M76822 Posterior tibial tendinitis, left leg: Secondary | ICD-10-CM | POA: Diagnosis not present

## 2023-10-19 DIAGNOSIS — L603 Nail dystrophy: Secondary | ICD-10-CM | POA: Diagnosis not present

## 2023-10-19 DIAGNOSIS — M2142 Flat foot [pes planus] (acquired), left foot: Secondary | ICD-10-CM | POA: Diagnosis not present

## 2023-10-19 DIAGNOSIS — M76821 Posterior tibial tendinitis, right leg: Secondary | ICD-10-CM | POA: Diagnosis not present

## 2023-10-19 DIAGNOSIS — B351 Tinea unguium: Secondary | ICD-10-CM | POA: Diagnosis not present

## 2023-10-24 DIAGNOSIS — G4489 Other headache syndrome: Secondary | ICD-10-CM | POA: Diagnosis not present

## 2023-10-24 DIAGNOSIS — R110 Nausea: Secondary | ICD-10-CM | POA: Diagnosis not present

## 2023-10-24 DIAGNOSIS — J069 Acute upper respiratory infection, unspecified: Secondary | ICD-10-CM | POA: Diagnosis not present

## 2023-10-24 DIAGNOSIS — R519 Headache, unspecified: Secondary | ICD-10-CM | POA: Diagnosis not present

## 2023-10-29 DIAGNOSIS — L603 Nail dystrophy: Secondary | ICD-10-CM | POA: Diagnosis not present

## 2023-10-31 DIAGNOSIS — R519 Headache, unspecified: Secondary | ICD-10-CM | POA: Diagnosis not present

## 2023-10-31 DIAGNOSIS — M545 Low back pain, unspecified: Secondary | ICD-10-CM | POA: Diagnosis not present

## 2023-10-31 DIAGNOSIS — Z8673 Personal history of transient ischemic attack (TIA), and cerebral infarction without residual deficits: Secondary | ICD-10-CM | POA: Diagnosis not present

## 2023-10-31 DIAGNOSIS — M542 Cervicalgia: Secondary | ICD-10-CM | POA: Diagnosis not present

## 2023-11-04 DIAGNOSIS — J069 Acute upper respiratory infection, unspecified: Secondary | ICD-10-CM | POA: Diagnosis not present

## 2023-11-04 DIAGNOSIS — Z20822 Contact with and (suspected) exposure to covid-19: Secondary | ICD-10-CM | POA: Diagnosis not present

## 2023-11-04 DIAGNOSIS — R509 Fever, unspecified: Secondary | ICD-10-CM | POA: Diagnosis not present

## 2023-11-07 DIAGNOSIS — R519 Headache, unspecified: Secondary | ICD-10-CM | POA: Diagnosis not present

## 2023-11-07 DIAGNOSIS — Z8673 Personal history of transient ischemic attack (TIA), and cerebral infarction without residual deficits: Secondary | ICD-10-CM | POA: Diagnosis not present

## 2023-11-07 DIAGNOSIS — R202 Paresthesia of skin: Secondary | ICD-10-CM | POA: Diagnosis not present

## 2023-11-07 DIAGNOSIS — R292 Abnormal reflex: Secondary | ICD-10-CM | POA: Diagnosis not present

## 2023-11-07 DIAGNOSIS — M4802 Spinal stenosis, cervical region: Secondary | ICD-10-CM | POA: Diagnosis not present

## 2023-11-07 DIAGNOSIS — M542 Cervicalgia: Secondary | ICD-10-CM | POA: Diagnosis not present

## 2023-11-16 DIAGNOSIS — M542 Cervicalgia: Secondary | ICD-10-CM | POA: Diagnosis not present

## 2023-11-16 DIAGNOSIS — M545 Low back pain, unspecified: Secondary | ICD-10-CM | POA: Diagnosis not present

## 2023-11-16 DIAGNOSIS — M6281 Muscle weakness (generalized): Secondary | ICD-10-CM | POA: Diagnosis not present

## 2023-11-17 DIAGNOSIS — M722 Plantar fascial fibromatosis: Secondary | ICD-10-CM | POA: Diagnosis not present

## 2023-11-17 DIAGNOSIS — B351 Tinea unguium: Secondary | ICD-10-CM | POA: Diagnosis not present

## 2023-11-17 DIAGNOSIS — M76821 Posterior tibial tendinitis, right leg: Secondary | ICD-10-CM | POA: Diagnosis not present

## 2023-11-17 DIAGNOSIS — M76822 Posterior tibial tendinitis, left leg: Secondary | ICD-10-CM | POA: Diagnosis not present

## 2023-11-20 ENCOUNTER — Telehealth: Payer: Self-pay

## 2023-11-20 NOTE — Telephone Encounter (Signed)
 Form left up front for pick up and patient notified.

## 2023-11-21 ENCOUNTER — Other Ambulatory Visit: Payer: Self-pay | Admitting: Emergency Medicine

## 2023-11-21 ENCOUNTER — Telehealth: Payer: Self-pay | Admitting: *Deleted

## 2023-11-21 DIAGNOSIS — B349 Viral infection, unspecified: Secondary | ICD-10-CM | POA: Diagnosis not present

## 2023-11-21 DIAGNOSIS — M791 Myalgia, unspecified site: Secondary | ICD-10-CM | POA: Diagnosis not present

## 2023-11-21 DIAGNOSIS — R5383 Other fatigue: Secondary | ICD-10-CM | POA: Diagnosis not present

## 2023-11-21 DIAGNOSIS — R110 Nausea: Secondary | ICD-10-CM | POA: Diagnosis not present

## 2023-11-21 MED ORDER — PROMETHAZINE-DM 6.25-15 MG/5ML PO SYRP: 5.0000 mL | ORAL_SOLUTION | Freq: Four times a day (QID) | ORAL | 0 refills | Status: DC | PRN

## 2023-11-21 NOTE — Telephone Encounter (Signed)
 Copied from CRM [id number]. Topic: Clinical - Medication Refill >> Nov 21, 2023  9:39 AM Isabell A wrote: Most Recent Primary Care Visit:  Provider: Salvatore Decent  Department: [facility]  Visit Type: ACUTE  Date: 10/17/2023  Medication: promethazine-dextromethorphan (PROMETHAZINE-DM) 6.25-15 MG/5ML syrup   Has the patient contacted their pharmacy? No (Agent: If no, request that the patient contact the pharmacy for the refill. If patient does not wish to contact the pharmacy document the reason why and proceed with request.) (Agent: If yes, when and what did the pharmacy advise?)  Is this the correct pharmacy for this prescription? Yes If no, delete pharmacy and type the correct one.  This is the patient's preferred pharmacy:  [facility] [id number] - [place], [place] - [address] [place] [address] Phone: [phone] Fax: [phone]    Has the prescription been filled recently? Yes  Is the patient out of the medication? No  Has the patient been seen for an appointment in the last year OR does the patient have an upcoming appointment? Yes  Can we respond through MyChart? Yes  Agent: Please be advised that Rx refills may take up to 3 business days. We ask that you follow-up with your pharmacy.

## 2023-11-21 NOTE — Telephone Encounter (Signed)
 This was stated as discontinued and is no longer in pt's mychart. Please advise refill.

## 2023-11-21 NOTE — Telephone Encounter (Signed)
 Requested Renewals   promethazine-dextromethorphan (PROMETHAZINE-DM) 6.25-15 MG/5ML syrup       Sig: Take 5 mLs by mouth 4 (four) times daily as needed for cough.   Disp: 240 mL    Refills: 0   Start: 11/21/2023   Class: Normal   Non-formulary   Last ordered: 1 month ago (10/13/2023) by Leslye Peer, MD     To be filled at: [facility] [id number] - [place], [place] - [address]       11/21/2023 - Rx Request: Otis Dials and Maricela Curet, RN (Newest Message First)View All Conversations on this Judy Pimple, RN to [facility] Triage Pool      11/21/23  9:51 AM Please see Rx Request.  Most Recent Pulm Visit:  08/02/2023  Patient have an upcoming appointment? Yes   11/21/23  9:40 AM Otis Dials routed this conversation to [id number] Nurse Triage Pool - Pulmonology Otis Dials  [place]   11/21/23  9:40 AM Note Copied from CRM [id number]. Topic: Clinical - Medication Refill >> Nov 21, 2023  9:39 AM Isabell A wrote: Most Recent Primary Care Visit:  Provider: Salvatore Decent  Department: [facility]  Visit Type: ACUTE  Date: 10/17/2023   Medication: promethazine-dextromethorphan (PROMETHAZINE-DM) 6.25-15 MG/5ML syrup    Has the patient contacted their pharmacy? No (Agent: If no, request that the patient contact the pharmacy for the refill. If patient does not wish to contact the pharmacy document the reason why and proceed with request.) (Agent: If yes, when and what did the pharmacy advise?)   Is this the correct pharmacy for this prescription? Yes If no, delete pharmacy and type the correct one.  This is the patient's preferred pharmacy:  [facility] [id number] - [place], [place] - [address] [place] [address] Phone: [phone] Fax: [phone]       Has the prescription been filled recently? Yes   Is the patient out of the medication? No   Has the patient been seen for an appointment in the last year OR does the patient  have an upcoming appointment? Yes   Can we respond through MyChart? Yes   Agent: Please be advised that Rx refills may take up to 3 business days. We ask that you follow-up with your pharmacy.        Dr. Delton Coombes, patient requesting a refill of this cough mediation, please advise if this is something you want refilled on a regular basis.  Thank you.

## 2023-11-23 MED ORDER — PROMETHAZINE-DM 6.25-15 MG/5ML PO SYRP: 5.0000 mL | ORAL_SOLUTION | Freq: Four times a day (QID) | ORAL | 0 refills | Status: DC | PRN

## 2023-11-23 NOTE — Telephone Encounter (Signed)
 NFN

## 2023-11-23 NOTE — Telephone Encounter (Signed)
I refilled for her 

## 2023-11-24 DIAGNOSIS — M542 Cervicalgia: Secondary | ICD-10-CM | POA: Diagnosis not present

## 2023-11-24 DIAGNOSIS — M6281 Muscle weakness (generalized): Secondary | ICD-10-CM | POA: Diagnosis not present

## 2023-11-24 DIAGNOSIS — M545 Low back pain, unspecified: Secondary | ICD-10-CM | POA: Diagnosis not present

## 2023-11-27 DIAGNOSIS — M545 Low back pain, unspecified: Secondary | ICD-10-CM | POA: Diagnosis not present

## 2023-11-27 DIAGNOSIS — M542 Cervicalgia: Secondary | ICD-10-CM | POA: Diagnosis not present

## 2023-11-27 DIAGNOSIS — M6281 Muscle weakness (generalized): Secondary | ICD-10-CM | POA: Diagnosis not present

## 2023-11-30 DIAGNOSIS — R202 Paresthesia of skin: Secondary | ICD-10-CM | POA: Diagnosis not present

## 2023-11-30 DIAGNOSIS — G959 Disease of spinal cord, unspecified: Secondary | ICD-10-CM | POA: Diagnosis not present

## 2023-12-05 DIAGNOSIS — M5442 Lumbago with sciatica, left side: Secondary | ICD-10-CM | POA: Diagnosis not present

## 2023-12-05 DIAGNOSIS — M5416 Radiculopathy, lumbar region: Secondary | ICD-10-CM | POA: Diagnosis not present

## 2023-12-05 DIAGNOSIS — G8929 Other chronic pain: Secondary | ICD-10-CM | POA: Diagnosis not present

## 2023-12-05 DIAGNOSIS — M542 Cervicalgia: Secondary | ICD-10-CM | POA: Diagnosis not present

## 2023-12-28 ENCOUNTER — Encounter: Payer: Self-pay | Admitting: Emergency Medicine

## 2023-12-28 ENCOUNTER — Ambulatory Visit

## 2023-12-28 ENCOUNTER — Ambulatory Visit: Payer: 59 | Admitting: Emergency Medicine

## 2023-12-28 VITALS — BP 111/81 | HR 76 | Temp 98.2°F

## 2023-12-28 DIAGNOSIS — J309 Allergic rhinitis, unspecified: Secondary | ICD-10-CM

## 2023-12-28 DIAGNOSIS — R053 Chronic cough: Secondary | ICD-10-CM | POA: Diagnosis not present

## 2023-12-28 DIAGNOSIS — R059 Cough, unspecified: Secondary | ICD-10-CM

## 2023-12-28 DIAGNOSIS — J4520 Mild intermittent asthma, uncomplicated: Secondary | ICD-10-CM

## 2023-12-28 DIAGNOSIS — J069 Acute upper respiratory infection, unspecified: Secondary | ICD-10-CM | POA: Diagnosis not present

## 2023-12-28 MED ORDER — ALBUTEROL SULFATE HFA 108 (90 BASE) MCG/ACT IN AERS: 2.0000 | INHALATION_SPRAY | Freq: Four times a day (QID) | RESPIRATORY_TRACT | 6 refills | Status: DC | PRN

## 2023-12-28 MED ORDER — FLUTICASONE PROPIONATE 50 MCG/ACT NA SUSP: 2.0000 | Freq: Every day | NASAL | 8 refills | Status: AC

## 2023-12-28 MED ORDER — AZITHROMYCIN 250 MG PO TABS: ORAL_TABLET | ORAL | 0 refills | Status: DC

## 2023-12-28 NOTE — Assessment & Plan Note (Signed)
 Continue Flonase , restart loratadine  at least during the allergy season

## 2023-12-28 NOTE — Progress Notes (Signed)
 Subjective:    Patient ID: Kathleen Solis, female    DOB: 08-18-1966, 58 y.o.   MRN: [id number]  HPI  ROV 12/28/2023 --follow-up visit for 58 year old woman with a history of mild intermittent asthma, cough with upper airway irritation syndrome (with GERD and rhinitis), mild OSA (AHI 7.1/h).  She underwent a bronchoscopy to evaluate left upper lobe groundglass opacity, no evidence of malignancy but there was a culture positive for beta-lactamase positive Prevotella.  CT chest done 07/27/2023 showed resolution.  She has been fairly dependent on Promethazine  DM for cough suppression.  At her last visit we stopped her CPAP due to intolerance.  She is managed on Flonase  but has intermittently had epistaxis with this. She has been experiencing weakness, URI sx, more cough and congestion. Her son was sick  as well. She started Augmentin . Also taking tylenol . She is out of albuterol . She is out of flonase , off loratadine     RADIOLOGY Chest CT: Subtle ground glass infiltrate in the intermedial left upper lobe has cleared; subtle basilar subpleural ground glass possibly reflecting atelectasis (07/27/2023)  DIAGNOSTIC Home sleep study: AHI of 7.1 per hour, higher during REM sleep (02/2023)  PATHOLOGY Bronchoscopy: Colonized with beta-lactamase positive Prevotella (08/2021)   Review of Systems  Constitutional:  Negative for fever and unexpected weight change.  HENT:  Negative for congestion, dental problem, ear pain, nosebleeds, postnasal drip, rhinorrhea, sinus pressure, sneezing, sore throat and trouble swallowing.   Eyes:  Negative for redness and itching.  Respiratory:  Positive for cough. Negative for chest tightness, shortness of breath and wheezing.   Cardiovascular:  Negative for palpitations and leg swelling.  Gastrointestinal:  Negative for nausea and vomiting.  Genitourinary:  Negative for dysuria.  Musculoskeletal:  Negative for joint swelling.  Skin:  Negative for rash.   Neurological:  Negative for headaches.  Hematological:  Does not bruise/bleed easily.  Psychiatric/Behavioral:  Negative for dysphoric mood. The patient is not nervous/anxious.        Objective:   Physical Exam Vitals:   12/28/23 0909  BP: 111/81  Pulse: 76  Temp: 98.2 F (36.8 C)  SpO2: 93%    Gen: Pleasant, well-nourished, in no distress,  normal affect  ENT: No lesions,  mouth clear,  oropharynx clear, no postnasal drip, strong voice, no throat clearing  Neck: No JVD, no stridor  Lungs: No use of accessory muscles, no crackles or wheezing on normal respiration, no wheeze on forced expiration  Cardiovascular: RRR, heart sounds normal, no murmur or gallops, no peripheral edema  Musculoskeletal: No deformities, no cyanosis or clubbing  Neuro: alert, awake, non focal  Skin: Warm, no lesions or rash      Assessment & Plan:  Upper respiratory infection With increased cough, viral symptoms.  Her son was just treated for possible pneumonia.  Considered COVID and flu testing but deferred since she is probably outside the window for antiviral treatment.  She took Augmentin  empirically as soon as she started feeling sick, completed 5 days.  I think we should cover her empirically for atypicals and I will give her a prescription for azithromycin .  Check a chest x-ray today.  Get her back on better allergy control.  Please take azithromycin  as directed until completely gone We will check a chest x-ray today We will refill your albuterol .  Use 2 puffs if you needed for shortness of breath, chest tightness, wheezing. Continue your fluticasone  nasal spray daily.  We will refill this for you today. Restart your loratadine  10  mg (Claritin ) once daily. Continue your promethazine -DM cough syrup in the evening for cough suppression Try using Tylenol  Cold and flu over-the-counter as needed for symptom control. Follow in our office in about 3 weeks to review how you are doing.  Mild  asthma Refill her albuterol  today  Allergic rhinitis Continue Flonase , restart loratadine  at least during the allergy season   Time spent 27 minutes  Racheal Buddle, MD, PhD 12/28/2023, 9:32 AM [facility] [phone] or if no answer [phone]

## 2023-12-28 NOTE — Patient Instructions (Addendum)
 Please take azithromycin  as directed until completely gone We will check a chest x-ray today We will refill your albuterol .  Use 2 puffs if you needed for shortness of breath, chest tightness, wheezing. Continue your fluticasone  nasal spray daily.  We will refill this for you today. Restart your loratadine  10 mg (Claritin ) once daily. Continue your promethazine -DM cough syrup in the evening for cough suppression Try using Tylenol  Cold and flu over-the-counter as needed for symptom control. Follow in our office in about 3 weeks to review how you are doing.

## 2023-12-28 NOTE — Assessment & Plan Note (Addendum)
 With increased cough, viral symptoms.  Her son was just treated for possible pneumonia.  Considered COVID and flu testing but deferred since she is probably outside the window for antiviral treatment.  She took Augmentin  empirically as soon as she started feeling sick, completed 5 days.  I think we should cover her empirically for atypicals and I will give her a prescription for azithromycin .  Check a chest x-ray today.  Get her back on better allergy control.  Please take azithromycin  as directed until completely gone We will check a chest x-ray today We will refill your albuterol .  Use 2 puffs if you needed for shortness of breath, chest tightness, wheezing. Continue your fluticasone  nasal spray daily.  We will refill this for you today. Restart your loratadine  10 mg (Claritin ) once daily. Continue your promethazine -DM cough syrup in the evening for cough suppression Try using Tylenol  Cold and flu over-the-counter as needed for symptom control. Follow in our office in about 3 weeks to review how you are doing.

## 2023-12-28 NOTE — Addendum Note (Signed)
 Addended byGregory Leash, Hazelyn Kallen A on: 12/28/2023 10:44 AM   Modules accepted: Orders

## 2023-12-28 NOTE — Assessment & Plan Note (Signed)
 Refill her albuterol  today

## 2024-01-01 ENCOUNTER — Emergency Department (HOSPITAL_BASED_OUTPATIENT_CLINIC_OR_DEPARTMENT_OTHER)
Admission: EM | Admit: 2024-01-01 | Discharge: 2024-01-01 | Disposition: A | Discharge status: Home / Self Care | Attending: Emergency Medicine | Admitting: Emergency Medicine

## 2024-01-01 ENCOUNTER — Encounter (HOSPITAL_BASED_OUTPATIENT_CLINIC_OR_DEPARTMENT_OTHER): Payer: Self-pay | Admitting: Emergency Medicine

## 2024-01-01 ENCOUNTER — Other Ambulatory Visit: Payer: Self-pay

## 2024-01-01 ENCOUNTER — Emergency Department (HOSPITAL_BASED_OUTPATIENT_CLINIC_OR_DEPARTMENT_OTHER): Admitting: Radiology

## 2024-01-01 ENCOUNTER — Ambulatory Visit: Payer: 59 | Admitting: Internal Medicine

## 2024-01-01 DIAGNOSIS — Z7982 Long term (current) use of aspirin: Secondary | ICD-10-CM | POA: Diagnosis not present

## 2024-01-01 DIAGNOSIS — R059 Cough, unspecified: Secondary | ICD-10-CM | POA: Diagnosis not present

## 2024-01-01 DIAGNOSIS — I10 Essential (primary) hypertension: Secondary | ICD-10-CM | POA: Diagnosis not present

## 2024-01-01 DIAGNOSIS — J069 Acute upper respiratory infection, unspecified: Secondary | ICD-10-CM | POA: Diagnosis not present

## 2024-01-01 DIAGNOSIS — J45909 Unspecified asthma, uncomplicated: Secondary | ICD-10-CM | POA: Diagnosis not present

## 2024-01-01 DIAGNOSIS — Z79899 Other long term (current) drug therapy: Secondary | ICD-10-CM | POA: Insufficient documentation

## 2024-01-01 DIAGNOSIS — Z8616 Personal history of COVID-19: Secondary | ICD-10-CM | POA: Insufficient documentation

## 2024-01-01 DIAGNOSIS — R0789 Other chest pain: Secondary | ICD-10-CM | POA: Diagnosis not present

## 2024-01-01 DIAGNOSIS — R051 Acute cough: Secondary | ICD-10-CM | POA: Diagnosis not present

## 2024-01-01 DIAGNOSIS — I2510 Atherosclerotic heart disease of native coronary artery without angina pectoris: Secondary | ICD-10-CM | POA: Insufficient documentation

## 2024-01-01 NOTE — ED Triage Notes (Signed)
 C/o cough x 1 week. Been taking azithromycin  from PCP. Denies CP but states "rattling in chest". Hx of asthma.

## 2024-01-01 NOTE — ED Notes (Signed)
 Pt given discharge instructions. Opportunities given for questions. Pt verbalizes understanding. Jillyn Hidden, RN

## 2024-01-01 NOTE — ED Provider Notes (Signed)
 [facility] AT [facility] Provider Note   CSN: [id number] Arrival date & time: 01/01/24  0847     History {Add pertinent medical, surgical, social history, OB history to HPI:1} Chief Complaint  Patient presents with   Cough    Kathleen Solis is a 58 y.o. female.  HPI     58 year old female with a history of CVA, carotid arterial disease, hypertension, hyperlipidemia, lumbar back pain, presents with concern for cough.  She had seen pulmonology on the eighth with concern for cough.  They diagnosed her with respiratory infection, prescribed her azithromycin , refilled her albuterol , restarted her loratadine .  Last night coughing and throwing up Hearing a noise when breathing  Son was here a week ago, had pneumonia on both lungs, saw Dr. Baldwin Levee put on abx and cxr Tired, weak, coughing too much Every time breathing hears the noise Coughing up yellow, blowing out clear and yellow from nose No fever, just fatigue Nausea  Past Medical History:  Diagnosis Date   Allergic rhinitis 08/16/2016   Benign essential hypertension 03/03/2015   Bone spur of right foot 02/08/2016   Bunion of left foot 02/08/2016   Carotid arterial disease 04/10/2016   03/2016: right 40-59%, left < 40%; 10/2020 carotid US : No evidence of B ICA stenosis   Cerebrovascular accident (CVA)    MRI - Chronic hyperintensity left frontal parietal white matter unchanged likely due to chronic ischemia.   Chest pain 02/14/2017   Cyst of left ovary 03/03/2015   Diverticulitis of colon without hemorrhage 02/25/2016   Epigastric pain 05/06/2020   Episodic tension-type headache 01/12/2016   Taking motrin  200 mg bid   GERD (gastroesophageal reflux disease)    High-tone pelvic floor dysfunction 10/27/2021   History of COVID-19 05/18/2021   History of gout 06/23/2022   Hyperglycemia 02/14/2017   Chronic; encouraged low sugar/carbohydrate diet and regular exercise   Hyperlipidemia    diet  controlled - no med   Left upper quadrant pain    Leg neuralgia, right    Related to CVA    Lower respiratory infection 07/18/2018   Lumbar back pain 05/10/2018   Lumbar radiculopathy 05/15/2018   Pain seems consistent with radiculopathy   MCI (mild cognitive impairment) with memory loss 12/31/2020   Midline cystocele    Mild asthma 08/27/2020   no inhaler   Obstructive sleep apnea 08/06/2021   Intolerant of cpap   Onychomycosis 02/21/2018   Toenails; Ciclopirox  prescribed   Oropharyngeal dysphagia 08/16/2016   Persistent cough 03/17/2019   Primary stress urinary incontinence 10/27/2021   Rib pain    Sciatica    right   Sinus headache 12/15/2022   Sinus infection 03/22/2019   SVD (spontaneous vaginal delivery)    x 2   Tinnitus of right ear 11/16/2016   no current problem as of 08/26/21 per patient   Upper respiratory infection 11/09/2016   Urinary hesitancy 12/31/2020   Viral respiratory infection 12/04/2019   Vitamin B12 deficiency 02/23/2018    Home Medications Prior to Admission medications   Medication Sig Start Date End Date Taking? Authorizing Provider  acetaminophen  (TYLENOL ) 500 MG tablet Take 500 mg by mouth every 6 (six) hours as needed.    [provider]  albuterol  (VENTOLIN  HFA) 108 (90 Base) MCG/ACT inhaler Inhale 2 puffs into the lungs every 6 (six) hours as needed for wheezing or shortness of breath. 12/28/23   Denson Flake, MD  amoxicillin -clavulanate (AUGMENTIN ) 875-125 MG tablet Take 1 tablet by  mouth 2 (two) times daily. Patient not taking: Reported on 12/28/2023 08/08/23   [provider]  aspirin  EC 81 MG tablet Take 1 tablet (81 mg total) by mouth in the morning. Swallow whole. 08/30/21   Denson Flake, MD  atorvastatin  (LIPITOR) 20 MG tablet Take 1 tablet (20 mg total) by mouth daily. 12/28/22   Colene Dauphin, MD  azithromycin  (ZITHROMAX ) 250 MG tablet Take 2 on the first day, then take 1 daily until completely gone 12/28/23   Byrum,  Delora Ferry, MD  colchicine  0.6 MG tablet Take 1 tablet (0.6 mg total) by mouth every 6 (six) hours as needed (gout pain). Patient not taking: Reported on 12/28/2023 05/17/22   Donley Furth, MD  fluticasone  (FLONASE ) 50 MCG/ACT nasal spray Place 2 sprays into both nostrils daily. 12/28/23   Denson Flake, MD  hydrochlorothiazide  (HYDRODIURIL ) 25 MG tablet Take 1 tablet (25 mg total) by mouth daily. 04/28/23   Colene Dauphin, MD  loratadine  (CLARITIN ) 10 MG tablet TAKE 1 TABLET BY MOUTH DAILY Patient not taking: Reported on 12/28/2023 11/22/23   Byrum, Robert S, MD  metoprolol  succinate (TOPROL -XL) 100 MG 24 hr tablet TAKE 1 TABLET BY MOUTH AT BEDTIME 09/25/23   Burns, Beckey Bourgeois, MD  potassium chloride  SA (KLOR-CON  M) 20 MEQ tablet Take 1 tablet (20 mEq total) by mouth daily. Patient not taking: Reported on 12/28/2023 02/14/23   Colene Dauphin, MD  promethazine -dextromethorphan (PROMETHAZINE -DM) 6.25-15 MG/5ML syrup Take 5 mLs by mouth 4 (four) times daily as needed for cough. 11/23/23   Byrum, Robert S, MD  vitamin B-12 (CYANOCOBALAMIN ) 50 MCG tablet Take 50 mcg by mouth daily.    [provider]      Allergies    Benzonatate , Darvon [propoxyphene], Ibuprofen , Morphine and codeine , Prednisone , Sulfa antibiotics, Tussionex pennkinetic er [hydrocod poli-chlorphe poli er], Buprenorphine hcl, Ivp dye [iodinated contrast media], and Prochlorperazine edisylate    Review of Systems   Review of Systems  Physical Exam Updated Vital Signs BP (!) 123/95 (BP Location: Left Arm)   Pulse 69   Temp 98.2 F (36.8 C) (Oral)   Resp 18   SpO2 94%  Physical Exam Vitals and nursing note reviewed.  Constitutional:      General: She is not in acute distress.    Appearance: She is well-developed. She is not diaphoretic.  HENT:     Head: Normocephalic and atraumatic.  Eyes:     Conjunctiva/sclera: Conjunctivae normal.  Cardiovascular:     Rate and Rhythm: Normal rate and regular rhythm.     Heart sounds: Normal  heart sounds. No murmur heard.    No friction rub. No gallop.  Pulmonary:     Effort: Pulmonary effort is normal. No respiratory distress.     Breath sounds: Normal breath sounds. No wheezing or rales.  Abdominal:     General: There is no distension.     Palpations: Abdomen is soft.     Tenderness: There is no abdominal tenderness. There is no guarding.  Musculoskeletal:        General: No tenderness.     Cervical back: Normal range of motion.  Skin:    General: Skin is warm and dry.     Findings: No erythema or rash.  Neurological:     Mental Status: She is alert and oriented to person, place, and time.     ED Results / Procedures / Treatments   Labs (all labs ordered are listed, but only  abnormal results are displayed) Labs Reviewed - No data to display  EKG None  Radiology No results found.  Procedures Procedures  {Document cardiac monitor, telemetry assessment procedure when appropriate:1}  Medications Ordered in ED Medications - No data to display  ED Course/ Medical Decision Making/ A&P   {   Click here for ABCD2, HEART and other calculatorsREFRESH Note before signing :1}                               58 year old female with a history of CVA, carotid arterial disease, hypertension, hyperlipidemia, lumbar back pain, presents with concern for cough.  EKG completed and personally eval and interpreted by me shows no significant changes in comparison to prior EKGs with nonspecific T wave changes.  Discussed options for care, including other lab work and agree in setting of primary label respiratory symptoms with chest discomfort associated with this and productive cough, suspect likely respiratory etiology of her discomfort and will defer labs.  Clinically, I have a low suspicion for pulmonary embolus, congestive heart failure.  She does not have wheezing to suggest significant asthma exacerbation, although does gust possibility of using steroids to help decrease  inflammation in the setting of her bronchitic cough.  She has had side effects to steroids in the past, and prefers to avoid them unless necessary.  Given continued and worsening cough, chest x-ray was performed which showed***.    {Document critical care time when appropriate:1} {Document review of labs and clinical decision tools ie heart score, Chads2Vasc2 etc:1}  {Document your independent review of radiology images, and any outside records:1} {Document your discussion with family members, caretakers, and with consultants:1} {Document social determinants of health affecting pt's care:1} {Document your decision making why or why not admission, treatments were needed:1} Final Clinical Impression(s) / ED Diagnoses Final diagnoses:  None    Rx / DC Orders ED Discharge Orders     None

## 2024-01-06 ENCOUNTER — Encounter: Payer: Self-pay | Admitting: Internal Medicine

## 2024-01-06 NOTE — Patient Instructions (Addendum)
      Blood work was ordered.       Medications changes include :   None    A referral was ordered and someone will call you to schedule an appointment.     Return in about 6 months (around 07/10/2024) for Physical Exam.

## 2024-01-06 NOTE — Progress Notes (Signed)
      Subjective:    Patient ID: Kathleen Solis, female    DOB: 04/19/1966, 58 y.o.   MRN: [id number]     HPI Kathleen Solis is here for follow up of her chronic medical problems.    Medications and allergies reviewed with patient and updated if appropriate.  Current Outpatient Medications on File Prior to Visit  Medication Sig Dispense Refill  . acetaminophen  (TYLENOL ) 500 MG tablet Take 500 mg by mouth every 6 (six) hours as needed.    . albuterol  (VENTOLIN  HFA) 108 (90 Base) MCG/ACT inhaler Inhale 2 puffs into the lungs every 6 (six) hours as needed for wheezing or shortness of breath. 8 g 6  . aspirin  EC 81 MG tablet Take 1 tablet (81 mg total) by mouth in the morning. Swallow whole.    . atorvastatin  (LIPITOR) 20 MG tablet Take 1 tablet (20 mg total) by mouth daily. 90 tablet 3  . colchicine  0.6 MG tablet Take 1 tablet (0.6 mg total) by mouth every 6 (six) hours as needed (gout pain). (Patient not taking: Reported on 03/01/2024) 60 tablet 0  . fluticasone  (FLONASE ) 50 MCG/ACT nasal spray Place 2 sprays into both nostrils daily. 16 g 8  . loratadine  (CLARITIN ) 10 MG tablet TAKE 1 TABLET BY MOUTH DAILY 30 tablet 2  . metoprolol  succinate (TOPROL -XL) 100 MG 24 hr tablet TAKE 1 TABLET BY MOUTH AT BEDTIME 90 tablet 1  . potassium chloride  SA (KLOR-CON  M) 20 MEQ tablet Take 1 tablet (20 mEq total) by mouth daily. (Patient not taking: Reported on 03/01/2024) 90 tablet 3  . vitamin B-12 (CYANOCOBALAMIN ) 50 MCG tablet Take 50 mcg by mouth daily.     No current facility-administered medications on file prior to visit.     Review of Systems     Objective:  There were no vitals filed for this visit. BP Readings from Last 3 Encounters:  03/01/24 128/88  02/08/24 123/89  02/03/24 (!) 110/90   Wt Readings from Last 3 Encounters:  03/01/24 188 lb 3.2 oz (85.4 kg)  02/08/24 186 lb 3.2 oz (84.5 kg)  01/16/24 191 lb (86.6 kg)   There is no height or weight on file to calculate BMI.     Physical Exam     Lab Results  Component Value Date   WBC 7.7 02/03/2024   HGB 13.1 02/03/2024   HCT 36.1 02/03/2024   PLT 216 02/03/2024   GLUCOSE 101 (H) 02/03/2024   CHOL 128 01/16/2024   TRIG 73.0 01/16/2024   HDL 46.90 01/16/2024   LDLCALC 67 01/16/2024   ALT 16 02/03/2024   AST 26 02/03/2024   NA 141 02/03/2024   K 3.1 (L) 02/03/2024   CL 102 02/03/2024   CREATININE 0.80 02/03/2024   BUN 20 02/03/2024   CO2 27 02/03/2024   TSH 1.58 04/28/2023   INR 1.18 11/24/2015   HGBA1C 5.3 01/16/2024     Assessment & Plan:    See Problem List for Assessment and Plan of chronic medical problems.    This encounter was created in error - please disregard.

## 2024-01-08 ENCOUNTER — Encounter: Admitting: Internal Medicine

## 2024-01-08 DIAGNOSIS — E782 Mixed hyperlipidemia: Secondary | ICD-10-CM

## 2024-01-08 DIAGNOSIS — R739 Hyperglycemia, unspecified: Secondary | ICD-10-CM

## 2024-01-08 DIAGNOSIS — I10 Essential (primary) hypertension: Secondary | ICD-10-CM

## 2024-01-08 DIAGNOSIS — Z8739 Personal history of other diseases of the musculoskeletal system and connective tissue: Secondary | ICD-10-CM

## 2024-01-08 DIAGNOSIS — Z8673 Personal history of transient ischemic attack (TIA), and cerebral infarction without residual deficits: Secondary | ICD-10-CM

## 2024-01-08 NOTE — Assessment & Plan Note (Signed)
 Chronic Lab Results  Component Value Date   HGBA1C 5.0 12/28/2022   Check a1c Low sugar / carb diet Stressed regular exercise

## 2024-01-08 NOTE — Assessment & Plan Note (Signed)
 H/o CVA Continue ASA 81 mg daily, atorvastatin 20 mg daily  BP controlled Encouraged healthy diet, regular exercise

## 2024-01-08 NOTE — Assessment & Plan Note (Signed)
 Chronic Regular exercise and healthy diet encouraged Lab Results  Component Value Date   LDLCALC 137 (H) 07/03/2023   Check lipid panel, CMP Continue atorvastatin  20 mg daily

## 2024-01-08 NOTE — Assessment & Plan Note (Signed)
 Chronic cmp, CBC Blood pressure well controlled Continue HCTZ 25 mg daily, metoprolol  XL 100 mg daily

## 2024-01-08 NOTE — Assessment & Plan Note (Signed)
 Chronic No recent flares Check uric acid level Continue colchicine  as needed

## 2024-01-15 NOTE — Progress Notes (Unsigned)
 Subjective:    Patient ID: Kathleen Solis, female    DOB: [birth year], 58 y.o.   MRN: [id number]     HPI Kathleen Solis is here for follow up of her chronic medical problems.  Still coughing - just saw pulm couple of weeks ago.  Son had PNA and tested pos for parainfluenza  She has not felt well for a couple of weeks - feels fatigued, has cough.  She did take 5 days of Augmentin  and a Z-Pak and neither one of them seem to really help.  She is concerned about developing pneumonia.  No regular exercise   Medications and allergies reviewed with patient and updated if appropriate.  Current Outpatient Medications on File Prior to Visit  Medication Sig Dispense Refill   acetaminophen  (TYLENOL ) 500 MG tablet Take 500 mg by mouth every 6 (six) hours as needed.     albuterol  (VENTOLIN  HFA) 108 (90 Base) MCG/ACT inhaler Inhale 2 puffs into the lungs every 6 (six) hours as needed for wheezing or shortness of breath. 8 g 6   aspirin  EC 81 MG tablet Take 1 tablet (81 mg total) by mouth in the morning. Swallow whole.     atorvastatin  (LIPITOR) 20 MG tablet Take 1 tablet (20 mg total) by mouth daily. 90 tablet 3   azithromycin  (ZITHROMAX ) 250 MG tablet Take 2 on the first day, then take 1 daily until completely gone 6 tablet 0   colchicine  0.6 MG tablet Take 1 tablet (0.6 mg total) by mouth every 6 (six) hours as needed (gout pain). 60 tablet 0   fluticasone  (FLONASE ) 50 MCG/ACT nasal spray Place 2 sprays into both nostrils daily. 16 g 8   hydrochlorothiazide  (HYDRODIURIL ) 25 MG tablet Take 1 tablet (25 mg total) by mouth daily. 90 tablet 2   loratadine  (CLARITIN ) 10 MG tablet TAKE 1 TABLET BY MOUTH DAILY 30 tablet 2   metoprolol  succinate (TOPROL -XL) 100 MG 24 hr tablet TAKE 1 TABLET BY MOUTH AT BEDTIME 90 tablet 1   potassium chloride  SA (KLOR-CON  M) 20 MEQ tablet Take 1 tablet (20 mEq total) by mouth daily. 90 tablet 3   promethazine -dextromethorphan (PROMETHAZINE -DM) 6.25-15 MG/5ML syrup Take  5 mLs by mouth 4 (four) times daily as needed for cough. 240 mL 0   vitamin B-12 (CYANOCOBALAMIN ) 50 MCG tablet Take 50 mcg by mouth daily.     No current facility-administered medications on file prior to visit.     Review of Systems  Constitutional:  Positive for appetite change (decreased), chills, diaphoresis (yesterday) and fatigue. Negative for fever (subjective fever).  HENT:  Negative for congestion, ear pain, sinus pain and sore throat.   Respiratory:  Positive for cough (occ productive). Negative for shortness of breath and wheezing.   Cardiovascular:  Negative for chest pain, palpitations and leg swelling.  Gastrointestinal:  Negative for constipation, diarrhea and nausea.  Musculoskeletal:  Negative for myalgias.  Neurological:  Negative for light-headedness and headaches.       Objective:   Vitals:   01/16/24 1030  BP: 114/70  Pulse: 60  Temp: 98 F (36.7 C)  SpO2: 96%   BP Readings from Last 3 Encounters:  01/16/24 114/70  01/01/24 112/86  12/28/23 111/81   Wt Readings from Last 3 Encounters:  01/16/24 191 lb (86.6 kg)  10/17/23 195 lb 6.4 oz (88.6 kg)  08/02/23 193 lb 3.2 oz (87.6 kg)   Body mass index is 31.3 kg/m.    Physical Exam Constitutional:  General: She is not in acute distress.    Appearance: Normal appearance. She is not ill-appearing.  HENT:     Head: Normocephalic and atraumatic.     Right Ear: Tympanic membrane, ear canal and external ear normal.     Left Ear: Tympanic membrane, ear canal and external ear normal.     Mouth/Throat:     Mouth: Mucous membranes are moist.     Pharynx: No oropharyngeal exudate or posterior oropharyngeal erythema.  Eyes:     Conjunctiva/sclera: Conjunctivae normal.  Cardiovascular:     Rate and Rhythm: Normal rate and regular rhythm.     Heart sounds: Normal heart sounds.  Pulmonary:     Effort: Pulmonary effort is normal. No respiratory distress.     Breath sounds: Normal breath sounds. No  wheezing or rales.  Musculoskeletal:     Cervical back: Neck supple. No tenderness.     Right lower leg: No edema.     Left lower leg: No edema.  Lymphadenopathy:     Cervical: No cervical adenopathy.  Skin:    General: Skin is warm and dry.     Findings: No rash.  Neurological:     Mental Status: She is alert. Mental status is at baseline.  Psychiatric:        Mood and Affect: Mood normal.        Behavior: Behavior normal.        Lab Results  Component Value Date   WBC 9.0 04/28/2023   HGB 14.7 04/28/2023   HCT 42.5 04/28/2023   PLT 220.0 04/28/2023   GLUCOSE 104 (H) 07/03/2023   CHOL 213 (H) 07/03/2023   TRIG 113.0 07/03/2023   HDL 53.50 07/03/2023   LDLCALC 137 (H) 07/03/2023   ALT 12 07/03/2023   AST 18 07/03/2023   NA 140 07/03/2023   K 3.5 07/03/2023   CL 98 07/03/2023   CREATININE 0.82 07/03/2023   BUN 15 07/03/2023   CO2 32 07/03/2023   TSH 1.58 04/28/2023   INR 1.18 11/24/2015   HGBA1C 5.0 12/28/2022     Assessment & Plan:    See Problem List for Assessment and Plan of chronic medical problems.

## 2024-01-15 NOTE — Patient Instructions (Addendum)
      Blood work was ordered.       Medications changes include :   None    A referral was ordered and someone will call you to schedule an appointment.     Return in about 6 months (around 07/18/2024) for Physical Exam.

## 2024-01-16 ENCOUNTER — Ambulatory Visit: Payer: Self-pay | Admitting: Internal Medicine

## 2024-01-16 ENCOUNTER — Ambulatory Visit (INDEPENDENT_AMBULATORY_CARE_PROVIDER_SITE_OTHER): Admitting: Internal Medicine

## 2024-01-16 ENCOUNTER — Encounter: Payer: Self-pay | Admitting: Internal Medicine

## 2024-01-16 VITALS — BP 114/70 | HR 60 | Temp 98.0°F | Ht 65.5 in | Wt 191.0 lb

## 2024-01-16 DIAGNOSIS — E782 Mixed hyperlipidemia: Secondary | ICD-10-CM

## 2024-01-16 DIAGNOSIS — I10 Essential (primary) hypertension: Secondary | ICD-10-CM

## 2024-01-16 DIAGNOSIS — E876 Hypokalemia: Secondary | ICD-10-CM

## 2024-01-16 DIAGNOSIS — J069 Acute upper respiratory infection, unspecified: Secondary | ICD-10-CM

## 2024-01-16 DIAGNOSIS — Z8739 Personal history of other diseases of the musculoskeletal system and connective tissue: Secondary | ICD-10-CM

## 2024-01-16 DIAGNOSIS — R739 Hyperglycemia, unspecified: Secondary | ICD-10-CM

## 2024-01-16 DIAGNOSIS — Z8673 Personal history of transient ischemic attack (TIA), and cerebral infarction without residual deficits: Secondary | ICD-10-CM

## 2024-01-16 LAB — COMPREHENSIVE METABOLIC PANEL WITH GFR
ALT: 12 U/L (ref 0–35)
AST: 17 U/L (ref 0–37)
Albumin: 4.4 g/dL (ref 3.5–5.2)
Alkaline Phosphatase: 84 U/L (ref 39–117)
BUN: 12 mg/dL (ref 6–23)
CO2: 32 meq/L (ref 19–32)
Calcium: 9.7 mg/dL (ref 8.4–10.5)
Chloride: 99 meq/L (ref 96–112)
Creatinine, Ser: 0.69 mg/dL (ref 0.40–1.20)
GFR: 96.56 mL/min (ref >60.00)
Glucose, Bld: 98 mg/dL (ref 70–99)
Potassium: 3.6 meq/L (ref 3.5–5.1)
Sodium: 141 meq/L (ref 135–145)
Total Bilirubin: 1.2 mg/dL (ref 0.2–1.2)
Total Protein: 7.9 g/dL (ref 6.0–8.3)

## 2024-01-16 LAB — LIPID PANEL
Cholesterol: 128 mg/dL (ref 0–200)
HDL: 46.9 mg/dL (ref >39.00)
LDL Cholesterol: 67 mg/dL (ref 0–99)
NonHDL: 81.28
Total CHOL/HDL Ratio: 3
Triglycerides: 73 mg/dL (ref 0.0–149.0)
VLDL: 14.6 mg/dL (ref 0.0–40.0)

## 2024-01-16 LAB — CBC WITH DIFFERENTIAL/PLATELET
Basophils Absolute: 0.1 10*3/uL (ref 0.0–0.1)
Basophils Relative: 1 % (ref 0.0–3.0)
Eosinophils Absolute: 0.1 10*3/uL (ref 0.0–0.7)
Eosinophils Relative: 1.1 % (ref 0.0–5.0)
HCT: 40.8 % (ref 36.0–46.0)
Hemoglobin: 14.3 g/dL (ref 12.0–15.0)
Lymphocytes Relative: 27.2 % (ref 12.0–46.0)
Lymphs Abs: 2.1 10*3/uL (ref 0.7–4.0)
MCHC: 35 g/dL (ref 30.0–36.0)
MCV: 90.9 fl (ref 78.0–100.0)
Monocytes Absolute: 0.4 10*3/uL (ref 0.1–1.0)
Monocytes Relative: 5.5 % (ref 3.0–12.0)
Neutro Abs: 4.9 10*3/uL (ref 1.4–7.7)
Neutrophils Relative %: 65.2 % (ref 43.0–77.0)
Platelets: 224 10*3/uL (ref 150.0–400.0)
RBC: 4.49 Mil/uL (ref 3.87–5.11)
RDW: 13.4 % (ref 11.5–15.5)
WBC: 7.6 10*3/uL (ref 4.0–10.5)

## 2024-01-16 LAB — URIC ACID: Uric Acid, Serum: 6.6 mg/dL (ref 2.4–7.0)

## 2024-01-16 LAB — HEMOGLOBIN A1C: Hgb A1c MFr Bld: 5.3 % (ref 4.6–6.5)

## 2024-01-16 NOTE — Assessment & Plan Note (Signed)
 Chronic cmp, CBC Blood pressure well controlled Continue HCTZ 25 mg daily, metoprolol  XL 100 mg daily

## 2024-01-16 NOTE — Assessment & Plan Note (Signed)
 H/o CVA Continue ASA 81 mg daily, atorvastatin 20 mg daily  BP controlled Encouraged healthy diet, regular exercise

## 2024-01-16 NOTE — Assessment & Plan Note (Signed)
 Chronic Regular exercise and healthy diet encouraged Lab Results  Component Value Date   LDLCALC 137 (H) 07/03/2023   Check lipid panel, CMP Continue atorvastatin  20 mg daily

## 2024-01-16 NOTE — Assessment & Plan Note (Signed)
 Chronic Related to hydrochlorothiazide  Cmp

## 2024-01-16 NOTE — Assessment & Plan Note (Signed)
 Chronic Lab Results  Component Value Date   HGBA1C 5.0 12/28/2022   Check a1c Low sugar / carb diet Stressed regular exercise

## 2024-01-16 NOTE — Assessment & Plan Note (Signed)
 Chronic No recent flares Check uric acid level Continue colchicine  as needed

## 2024-01-16 NOTE — Assessment & Plan Note (Signed)
 Subacute Going on for about 2 weeks Has taken Augmentin , Z-Pak without much improvement Her son had parainfluenza and that probably which she has in addition to a flare of her chronic cough Lungs are clear Doubt pneumonia Continue symptomatic treatment

## 2024-01-20 ENCOUNTER — Other Ambulatory Visit: Payer: Self-pay | Admitting: Internal Medicine

## 2024-01-22 DIAGNOSIS — G459 Transient cerebral ischemic attack, unspecified: Secondary | ICD-10-CM | POA: Diagnosis not present

## 2024-01-22 DIAGNOSIS — Z8673 Personal history of transient ischemic attack (TIA), and cerebral infarction without residual deficits: Secondary | ICD-10-CM | POA: Diagnosis not present

## 2024-01-22 DIAGNOSIS — E782 Mixed hyperlipidemia: Secondary | ICD-10-CM | POA: Diagnosis not present

## 2024-01-22 DIAGNOSIS — I6522 Occlusion and stenosis of left carotid artery: Secondary | ICD-10-CM | POA: Diagnosis not present

## 2024-01-31 ENCOUNTER — Other Ambulatory Visit: Payer: Self-pay | Admitting: Emergency Medicine

## 2024-01-31 NOTE — Telephone Encounter (Unsigned)
 Copied from CRM [id number]. Topic: Clinical - Medication Refill >> Jan 31, 2024  4:32 PM Dyann Glaser G wrote: Medication: promethazine -dextromethorphan (PROMETHAZINE -DM) 6.25-15 MG/5ML syrup  Has the patient contacted their pharmacy? No (Agent: If no, request that the patient contact the pharmacy for the refill. If patient does not wish to contact the pharmacy document the reason why and proceed with request.) (Agent: If yes, when and what did the pharmacy advise?)  This is the patient's preferred pharmacy:  [facility] [id number] - [place], [place] - [address] [place] [address] Phone: [phone] Fax: [phone]  Is this the correct pharmacy for this prescription? Yes If no, delete pharmacy and type the correct one.   Has the prescription been filled recently? Yes  Is the patient out of the medication? No  Has the patient been seen for an appointment in the last year OR does the patient have an upcoming appointment? Yes  Can we respond through MyChart? No  Agent: Please be advised that Rx refills may take up to 3 business days. We ask that you follow-up with your pharmacy.

## 2024-02-01 NOTE — Telephone Encounter (Signed)
 Dr.Byrum can you please advise   Thank you

## 2024-02-02 MED ORDER — PROMETHAZINE-DM 6.25-15 MG/5ML PO SYRP: 5.0000 mL | ORAL_SOLUTION | Freq: Four times a day (QID) | ORAL | 0 refills | Status: DC | PRN

## 2024-02-02 NOTE — Telephone Encounter (Signed)
 I signed for her

## 2024-02-03 ENCOUNTER — Encounter (HOSPITAL_BASED_OUTPATIENT_CLINIC_OR_DEPARTMENT_OTHER): Payer: Self-pay | Admitting: *Deleted

## 2024-02-03 ENCOUNTER — Emergency Department (HOSPITAL_BASED_OUTPATIENT_CLINIC_OR_DEPARTMENT_OTHER)

## 2024-02-03 ENCOUNTER — Other Ambulatory Visit: Payer: Self-pay

## 2024-02-03 ENCOUNTER — Emergency Department (HOSPITAL_BASED_OUTPATIENT_CLINIC_OR_DEPARTMENT_OTHER)
Admission: EM | Admit: 2024-02-03 | Discharge: 2024-02-03 | Disposition: A | Discharge status: Home / Self Care | Attending: Emergency Medicine | Admitting: Emergency Medicine

## 2024-02-03 DIAGNOSIS — I10 Essential (primary) hypertension: Secondary | ICD-10-CM | POA: Insufficient documentation

## 2024-02-03 DIAGNOSIS — R109 Unspecified abdominal pain: Secondary | ICD-10-CM | POA: Diagnosis not present

## 2024-02-03 DIAGNOSIS — Z8616 Personal history of COVID-19: Secondary | ICD-10-CM | POA: Insufficient documentation

## 2024-02-03 DIAGNOSIS — R1032 Left lower quadrant pain: Secondary | ICD-10-CM | POA: Diagnosis not present

## 2024-02-03 DIAGNOSIS — J45909 Unspecified asthma, uncomplicated: Secondary | ICD-10-CM | POA: Insufficient documentation

## 2024-02-03 DIAGNOSIS — K5730 Diverticulosis of large intestine without perforation or abscess without bleeding: Secondary | ICD-10-CM | POA: Diagnosis not present

## 2024-02-03 DIAGNOSIS — R1030 Lower abdominal pain, unspecified: Secondary | ICD-10-CM | POA: Insufficient documentation

## 2024-02-03 DIAGNOSIS — Z9049 Acquired absence of other specified parts of digestive tract: Secondary | ICD-10-CM | POA: Diagnosis not present

## 2024-02-03 LAB — COMPREHENSIVE METABOLIC PANEL WITH GFR
ALT: 16 U/L (ref 0–44)
AST: 26 U/L (ref 15–41)
Albumin: 4.1 g/dL (ref 3.5–5.0)
Alkaline Phosphatase: 104 U/L (ref 38–126)
Anion gap: 12 (ref 5–15)
BUN: 20 mg/dL (ref 6–20)
CO2: 27 mmol/L (ref 22–32)
Calcium: 9.5 mg/dL (ref 8.9–10.3)
Chloride: 102 mmol/L (ref 98–111)
Creatinine, Ser: 0.8 mg/dL (ref 0.44–1.00)
GFR, Estimated: >60 mL/min (ref >60)
Glucose, Bld: 101 mg/dL — ABNORMAL HIGH (ref 70–99)
Potassium: 3.1 mmol/L — ABNORMAL LOW (ref 3.5–5.1)
Sodium: 141 mmol/L (ref 135–145)
Total Bilirubin: 0.9 mg/dL (ref 0.0–1.2)
Total Protein: 7 g/dL (ref 6.5–8.1)

## 2024-02-03 LAB — LIPASE, BLOOD: Lipase: 27 U/L (ref 11–51)

## 2024-02-03 LAB — URINALYSIS, ROUTINE W REFLEX MICROSCOPIC
Bilirubin Urine: NEGATIVE
Glucose, UA: NEGATIVE mg/dL
Ketones, ur: NEGATIVE mg/dL
Nitrite: NEGATIVE
Protein, ur: 30 mg/dL — AB
RBC / HPF: >50 RBC/hpf (ref 0–5)
Specific Gravity, Urine: 1.02 (ref 1.005–1.030)
WBC, UA: >50 WBC/hpf (ref 0–5)
pH: 6.5 (ref 5.0–8.0)

## 2024-02-03 LAB — CBC
HCT: 36.1 % (ref 36.0–46.0)
Hemoglobin: 13.1 g/dL (ref 12.0–15.0)
MCH: 32.3 pg (ref 26.0–34.0)
MCHC: 36.3 g/dL — ABNORMAL HIGH (ref 30.0–36.0)
MCV: 89.1 fL (ref 80.0–100.0)
Platelets: 216 10*3/uL (ref 150–400)
RBC: 4.05 MIL/uL (ref 3.87–5.11)
RDW: 13.7 % (ref 11.5–15.5)
WBC: 7.7 10*3/uL (ref 4.0–10.5)
nRBC: 0 % (ref 0.0–0.2)

## 2024-02-03 MED ORDER — ACETAMINOPHEN 500 MG PO TABS
1000.0000 mg | ORAL_TABLET | Freq: Once | ORAL | Status: AC
  Filled 2024-02-03: qty 2

## 2024-02-03 MED ORDER — CEPHALEXIN 250 MG PO CAPS
500.0000 mg | ORAL_CAPSULE | Freq: Once | ORAL | Status: AC
  Filled 2024-02-03: qty 2

## 2024-02-03 MED ORDER — CEPHALEXIN 500 MG PO CAPS
500.0000 mg | ORAL_CAPSULE | Freq: Four times a day (QID) | ORAL | 0 refills | Status: AC
Start: 2024-02-03 — End: 2024-02-13

## 2024-02-03 MED ORDER — OXYCODONE HCL 5 MG PO TABS: 5.0000 mg | ORAL_TABLET | ORAL | 0 refills | Status: DC | PRN

## 2024-02-03 MED ORDER — CEPHALEXIN 500 MG PO CAPS: 500.0000 mg | ORAL_CAPSULE | Freq: Three times a day (TID) | ORAL | 0 refills | Status: DC

## 2024-02-03 NOTE — ED Provider Notes (Signed)
 [facility]ERGENCY [facility] Emergency Department Provider Note MRN:  [id number]  Arrival date & time: 02/03/24     Chief Complaint   Abdominal Pain and Back Pain   History of Present Illness   Kathleen Solis is a 58 y.o. year-old female with a history of stroke presenting to the ED with chief complaint of abdominal pain and back pain.  Lower abdominal pain as well as bilateral flank pain for the past several days.  Some blood in the urine recently.  Denies fever.  Review of Systems  A thorough review of systems was obtained and all systems are negative except as noted in the HPI and PMH.   Patient's Health History    Past Medical History:  Diagnosis Date   Allergic rhinitis 08/16/2016   Benign essential hypertension 03/03/2015   Bone spur of right foot 02/08/2016   Bunion of left foot 02/08/2016   Carotid arterial disease 04/10/2016   03/2016: right 40-59%, left < 40%; 10/2020 carotid US : No evidence of B ICA stenosis   Cerebrovascular accident (CVA)    MRI - Chronic hyperintensity left frontal parietal white matter unchanged likely due to chronic ischemia.   Chest pain 02/14/2017   Cyst of left ovary 03/03/2015   Diverticulitis of colon without hemorrhage 02/25/2016   Epigastric pain 05/06/2020   Episodic tension-type headache 01/12/2016   Taking motrin  200 mg bid   GERD (gastroesophageal reflux disease)    High-tone pelvic floor dysfunction 10/27/2021   History of COVID-19 05/18/2021   History of gout 06/23/2022   Hyperglycemia 02/14/2017   Chronic; encouraged low sugar/carbohydrate diet and regular exercise   Hyperlipidemia    diet controlled - no med   Left upper quadrant pain    Leg neuralgia, right    Related to CVA    Lower respiratory infection 07/18/2018   Lumbar back pain 05/10/2018   Lumbar radiculopathy 05/15/2018   Pain seems consistent with radiculopathy   MCI (mild cognitive impairment) with memory loss 12/31/2020   Midline cystocele     Mild asthma 08/27/2020   no inhaler   Obstructive sleep apnea 08/06/2021   Intolerant of cpap   Onychomycosis 02/21/2018   Toenails; Ciclopirox  prescribed   Oropharyngeal dysphagia 08/16/2016   Persistent cough 03/17/2019   Primary stress urinary incontinence 10/27/2021   Rib pain    Sciatica    right   Sinus headache 12/15/2022   Sinus infection 03/22/2019   SVD (spontaneous vaginal delivery)    x 2   Tinnitus of right ear 11/16/2016   no current problem as of 08/26/21 per patient   Upper respiratory infection 11/09/2016   Urinary hesitancy 12/31/2020   Viral respiratory infection 12/04/2019   Vitamin B12 deficiency 02/23/2018    Past Surgical History:  Procedure Laterality Date   ABDOMINAL HYSTERECTOMY     partial   ANAL FISSURE REPAIR     APPENDECTOMY     BRONCHIAL WASHINGS  08/30/2021   Procedure: BRONCHIAL WASHINGS;  Surgeon: Denson Flake, MD;  Location: [facility] ENDOSCOPY;  Service: Cardiopulmonary;;   CHOLECYSTECTOMY     CYSTOSCOPY N/A 10/04/2017   Procedure: Orin Birk;  Surgeon: Arlee Lace, MD;  Location: [facility] ORS;  Service: Gynecology;  Laterality: N/A;   FOOT SURGERY Left    big toe bunion removed   LAPAROSCOPIC BILATERAL SALPINGO OOPHERECTOMY Bilateral 10/04/2017   Procedure: LAPAROSCOPIC BILATERAL SALPINGO OOPHORECTOMY, PELVIC WASHINGS;  Surgeon: Arlee Lace, MD;  Location: [facility] ORS;  Service: Gynecology;  Laterality: Bilateral;   SACRAL COLPOPEXY  ROBOTIC, posterior repair, cystoscopy  07/04/2019   @ [facility]   TUBAL LIGATION     VIDEO BRONCHOSCOPY N/A 08/30/2021   Procedure: VIDEO BRONCHOSCOPY WITHOUT FLUORO;  Surgeon: Denson Flake, MD;  Location: [facility] ENDOSCOPY;  Service: Cardiopulmonary;  Laterality: N/A;   WISDOM TOOTH EXTRACTION      Family History  Problem Relation Age of Onset   Breast cancer Sister    Diabetes Sister    Glaucoma Sister    Cataracts Sister    Hypertension Sister    Anuerysm Mother 45   Pancreatic cancer Father    Retinal detachment Brother     Cataracts Sister    Breast cancer Cousin    Anuerysm Cousin    Breast cancer Niece    Esophageal cancer Nephew    Developmental delay Nephew    Cerebral palsy Son        mild   Colon polyps Neg Hx    Prostate cancer Neg Hx    Rectal cancer Neg Hx    Stomach cancer Neg Hx    Uterine cancer Neg Hx     Social History   Socioeconomic History   Marital status: Single    Spouse name: Not on file   Number of children: 2   Years of education: 14   Highest education level: Associate degree: academic program  Occupational History   Occupation: Disability  Tobacco Use   Smoking status: Never   Smokeless tobacco: Never  Vaping Use   Vaping status: Never Used  Substance and Sexual Activity   Alcohol use: No    Alcohol/week: 0.0 standard drinks of alcohol   Drug use: No   Sexual activity: Not on file    Comment: Hysterectomy  Other Topics Concern   Not on file  Social History Narrative   Yes to regular exercise at [facility]      Pt is right handed, she occasionally drinks tea, walks QOD. She lives with her 8 yr old son, he has mild cerebral palsy.   Social Drivers of Corporate investment banker Strain: Low Risk  (09/29/2023)   Received from [facility]   Overall Financial Resource Strain (CARDIA)    Difficulty of Paying Living Expenses: Not hard at all  Food Insecurity: No Food Insecurity (09/29/2023)   Received from [facility]   Hunger Vital Sign    Within the past 12 months, you worried that your food would run out before you got the money to buy more.: Never true    Within the past 12 months, the food you bought just didn't last and you didn't have money to get more.: Never true  Transportation Needs: No Transportation Needs (09/29/2023)   Received [facility] - [facility]    Lack of Transportation (Medical): No    Lack of Transportation (Non-Medical): No  Physical Activity: Unknown (09/29/2023)   Received from [facility]   Exercise Vital Sign    On  average, how many days per week do you engage in moderate to strenuous exercise (like a brisk walk)?: 0 days    Minutes of Exercise per Session: Not on file  Stress: No Stress Concern Present (09/29/2023)   Received from [facility] - Occupational Stress Questionnaire    Feeling of Stress : Not at all  Social Connections: Socially Integrated (09/29/2023)   Received from [facility]   Social Network    How would you rate your social  network (family, work, friends)?: Good participation with social networks  Intimate Partner Violence: Unknown (06/15/2023)   Received from [facility]   HITS    Physically Hurt: Not on file    Insult or Talk Down To: Not on file    Threaten Physical Harm: Not on file    Scream or Curse: Not on file     Physical Exam   Vitals:   02/03/24 0104  BP: (!) 108/96  Pulse: 68  Resp: 16  Temp: 98.6 F (37 C)  SpO2: 95%    CONSTITUTIONAL: Well-appearing, NAD NEURO/PSYCH:  Alert and oriented x 3, no focal deficits EYES:  eyes equal and reactive ENT/NECK:  no LAD, no JVD CARDIO: Regular rate, well-perfused, normal S1 and S2 PULM:  CTAB no wheezing or rhonchi GI/GU:  non-distended, non-tender MSK/SPINE:  No gross deformities, no edema SKIN:  no rash, atraumatic   *Additional and/or pertinent findings included in MDM below  Diagnostic and Interventional Summary    EKG Interpretation Date/Time:    Ventricular Rate:    PR Interval:    QRS Duration:    QT Interval:    QTC Calculation:   R Axis:      Text Interpretation:         Labs Reviewed  CBC - Abnormal; Notable for the following components:      Result Value   MCHC 36.3 (*)    All other components within normal limits  COMPREHENSIVE METABOLIC PANEL WITH GFR - Abnormal; Notable for the following components:   Potassium 3.1 (*)    Glucose, Bld 101 (*)    All other components within normal limits  URINALYSIS, ROUTINE W REFLEX MICROSCOPIC -  Abnormal; Notable for the following components:   Color, Urine ORANGE (*)    APPearance HAZY (*)    Hgb urine dipstick LARGE (*)    Protein, ur 30 (*)    Leukocytes,Ua MODERATE (*)    Bacteria, UA RARE (*)    Crystals PRESENT (*)    All other components within normal limits  LIPASE, BLOOD    CT RENAL STONE STUDY  Final Result      Medications  cephALEXin  (KEFLEX ) capsule 500 mg (has no administration in time range)  acetaminophen  (TYLENOL ) tablet 1,000 mg (1,000 mg Oral Given 02/03/24 0213)     Procedures  /  Critical Care Procedures  ED Course and Medical Decision Making  Initial Impression and Ddx Differential diagnosis includes kidney stone, pyelonephritis, UTI, less concern for appendicitis or diverticulitis.  Past medical/surgical history that increases complexity of ED encounter: Multiple abdominal surgeries  Interpretation of Diagnostics I personally reviewed the Laboratory Testing and my interpretation is as follows: No significant blood count or electrolyte disturbance.  CT unremarkable  Patient Reassessment and Ultimate Disposition/Management     Patient continues to look well with normal vital signs.  Explains that sometimes she has vaginal pain, reports a history of either uterine or bladder prolapse that was treated with surgery.  No signs of complication of this on the CT scan, she agrees to follow-up with her surgeon regarding this issue especially if she does not improve on antibiotics.  Some evidence to suggest UTI based on urinalysis.  Appropriate for discharge.  Patient management required discussion with the following services or consulting groups:  None  Complexity of Problems Addressed Acute illness or injury that poses threat of life of bodily function  Additional Data Reviewed and Analyzed Further history obtained from: Further history from spouse/family member  Additional Factors Impacting ED Encounter Risk Prescriptions and Consideration of  hospitalization  Merrick Abe. Harless Lien, MD [facility] Emergency Medicine [facility] [email or web address]  Final Clinical Impressions(s) / ED Diagnoses     ICD-10-CM   1. Lower abdominal pain  R10.30       ED Discharge Orders          Ordered    cephALEXin  (KEFLEX ) 500 MG capsule  3 times daily,   Status:  Discontinued        02/03/24 0309    oxyCODONE  (ROXICODONE ) 5 MG immediate release tablet  Every 4 hours PRN        02/03/24 0309    cephALEXin  (KEFLEX ) 500 MG capsule  4 times daily        02/03/24 0310             Discharge Instructions Discussed with and Provided to Patient:     Discharge Instructions      You were evaluated in the Emergency Department and after careful evaluation, we did not find any emergent condition requiring admission or further testing in the hospital.  Your exam/testing today was overall reassuring.  Your symptoms may be due to a urinary tract infection.  Take the Keflex  antibiotic as prescribed.  Use Tylenol  1000 mg every 4-6 hours for pain.  Can use the oxycodone  for more significant pain.  If you continue to have vaginal pain after the antibiotics would recommend follow-up with your OB/GYN.  Please return to the Emergency Department if you experience any worsening of your condition.  Thank you for allowing us  to be a part of your care.        Edson Graces, MD 02/03/24 [phone]

## 2024-02-03 NOTE — ED Notes (Signed)
 Discharge instructions reviewed.   Newly prescribed medications discussed. Pharmacy verified.   Opportunity for questions and concerns provided.   Alert, oriented and ambulatory. Displays no signs of distress.

## 2024-02-03 NOTE — ED Triage Notes (Addendum)
 Pt having back pain throughout the week with lower abd discomfort. Denies fever or chills. Tonight noticed blood in urine. Hx of prolapsed bladder and has mesh placed

## 2024-02-03 NOTE — Discharge Instructions (Addendum)
 You were evaluated in the Emergency Department and after careful evaluation, we did not find any emergent condition requiring admission or further testing in the hospital.  Your exam/testing today was overall reassuring.  Your symptoms may be due to a urinary tract infection.  Take the Keflex  antibiotic as prescribed.  Use Tylenol  1000 mg every 4-6 hours for pain.  Can use the oxycodone  for more significant pain.  If you continue to have vaginal pain after the antibiotics would recommend follow-up with your OB/GYN.  Please return to the Emergency Department if you experience any worsening of your condition.  Thank you for allowing us  to be a part of your care.

## 2024-02-07 DIAGNOSIS — M545 Low back pain, unspecified: Secondary | ICD-10-CM | POA: Diagnosis not present

## 2024-02-07 DIAGNOSIS — M47816 Spondylosis without myelopathy or radiculopathy, lumbar region: Secondary | ICD-10-CM | POA: Diagnosis not present

## 2024-02-07 DIAGNOSIS — G8929 Other chronic pain: Secondary | ICD-10-CM | POA: Diagnosis not present

## 2024-02-08 ENCOUNTER — Ambulatory Visit (HOSPITAL_BASED_OUTPATIENT_CLINIC_OR_DEPARTMENT_OTHER): Admitting: Adult Health

## 2024-02-08 ENCOUNTER — Encounter (HOSPITAL_BASED_OUTPATIENT_CLINIC_OR_DEPARTMENT_OTHER): Payer: Self-pay | Admitting: Adult Health

## 2024-02-08 VITALS — BP 123/89 | HR 59 | Ht 65.5 in | Wt 186.2 lb

## 2024-02-08 DIAGNOSIS — R053 Chronic cough: Secondary | ICD-10-CM

## 2024-02-08 DIAGNOSIS — J4531 Mild persistent asthma with (acute) exacerbation: Secondary | ICD-10-CM | POA: Diagnosis not present

## 2024-02-08 MED ORDER — BUDESONIDE-FORMOTEROL FUMARATE 80-4.5 MCG/ACT IN AERO: 2.0000 | INHALATION_SPRAY | Freq: Two times a day (BID) | RESPIRATORY_TRACT | 12 refills | Status: DC

## 2024-02-08 NOTE — Assessment & Plan Note (Signed)
 Continues to have ongoing cough.  Will treat with aggressive cough control regimen and sugar control.  Consider HRCT chest on return if no improvement  Plan  Patient Instructions  Begin Symbicort  80 2 puffs Twice daily, rinse after use.  Begin Delsym 2 tsp Twice daily  for cough Restart Claritin  10mg  daily  Begin Pepcid  20mg  At bedtime   Albuterol  inhaler As needed   Sips of water  to soothe throat and avoid throat clearing or coughing .  Follow up with Dr. Baldwin Levee  in 6-8 weeks and As needed   Please contact office for sooner follow up if symptoms do not improve or worsen or seek emergency care

## 2024-02-08 NOTE — Progress Notes (Signed)
 @Patient  ID: Kathleen Solis, female    DOB: 05/16/1966, 58 y.o.   MRN: [id number]  Chief Complaint  Patient presents with   Follow-up    Chronic cough    Referring provider: Colene Dauphin, MD  HPI: A 58 year old female never smoker followed for mild intermittent asthma.  And a chronic cough.   Previous Abnormal CT chest with LUL opacity- resolved on serial CT chest  Medical history significant for mild obstructive sleep apnea not on CPAP  TEST/EVENTS :  Chest CT: Subtle ground glass infiltrate in the intermedial left upper lobe has cleared; subtle basilar subpleural ground glass possibly reflecting atelectasis (07/27/2023)   DIAGNOSTIC Home sleep study: AHI of 7.1 per hour, higher during REM sleep (02/2023)   PATHOLOGY Bronchoscopy: Colonized with beta-lactamase positive Prevotella (08/2021)  PFT 2020 and 2023 Normal   02/08/2024 Follow up ; Chronic cough, Asthma  Discussed the use of AI scribe software for clinical note transcription with the patient, who gave verbal consent to proceed.  History of Present Illness   Kathleen Solis is a 58 year old female with mild intermittent asthma and chronic cough who presents for a follow-up visit.  She has experienced a chronic cough for several years, which persists despite various treatments. The cough is primarily dry but occasionally produces yellow mucus, especially after upper respiratory infections. It occurs daily but not continuously throughout the day.  Last month  she was treated with a Z-Pak for an upper respiratory infection, and a chest x-ray at that time was clear. She has used Claritin  for postnasal drainage and promethazine  DM for cough briefly, but these have not provided significant relief. Albuterol  has been used as needed, but it has not been effective in alleviating her symptoms.   She underwent a bronchoscopy in 2023, after which she was treated with antibiotics, but her cough persisted. Multiple breathing  tests have returned normal results, and a CT scan in June showed minimal scarring at the lung bases.  No history of smoking, chemical exposures, or significant allergies. She is currently on disability due to a history of multiple mini strokes. Family history of lupus in her first cousins on her mother's side. No hot tub/basement. No pets. No birds/chickens. No Methotrexate or Amiodarone use.   No heartburn, indigestion, significant postnasal drainage, or allergy symptoms. Follows with GI.  Occasionally experiences swallowing difficulties, where food feels 'clogged'. No history of pulmonary fibrosis or autoimmune diseases.  Current medications include albuterol  as needed. Previously on Symbicort  and Advair, though not consistently. Allergic to prednisone .       Allergies  Allergen Reactions   Benzonatate  Other (See Comments)    Headaches   Darvon [Propoxyphene] Other (See Comments)    Gi problems   Ibuprofen      tinnitus   Morphine And Codeine  Itching   Prednisone  Other (See Comments)    Make her head hurt   Sulfa Antibiotics Hives   Tussionex Pennkinetic Er [Hydrocod Poli-Chlorphe Poli Er] Other (See Comments)    Nausea, vomiting   Buprenorphine Hcl Itching   Ivp Dye [Iodinated Contrast Media] Nausea And Vomiting   Prochlorperazine Edisylate Itching and Other (See Comments)    Acts weird: Hallucinations    Immunization History  Administered Date(s) Administered   PFIZER(Purple Top)SARS-COV-2 Vaccination 04/11/2020, 06/16/2020   PPD Test 05/23/2022    Past Medical History:  Diagnosis Date   Allergic rhinitis 08/16/2016   Benign essential hypertension 03/03/2015   Bone spur of right foot 02/08/2016  Bunion of left foot 02/08/2016   Carotid arterial disease 04/10/2016   03/2016: right 40-59%, left < 40%; 10/2020 carotid US : No evidence of B ICA stenosis   Cerebrovascular accident (CVA)    MRI - Chronic hyperintensity left frontal parietal white matter unchanged likely due  to chronic ischemia.   Chest pain 02/14/2017   Cyst of left ovary 03/03/2015   Diverticulitis of colon without hemorrhage 02/25/2016   Epigastric pain 05/06/2020   Episodic tension-type headache 01/12/2016   Taking motrin  200 mg bid   GERD (gastroesophageal reflux disease)    High-tone pelvic floor dysfunction 10/27/2021   History of COVID-19 05/18/2021   History of gout 06/23/2022   Hyperglycemia 02/14/2017   Chronic; encouraged low sugar/carbohydrate diet and regular exercise   Hyperlipidemia    diet controlled - no med   Left upper quadrant pain    Leg neuralgia, right    Related to CVA    Lower respiratory infection 07/18/2018   Lumbar back pain 05/10/2018   Lumbar radiculopathy 05/15/2018   Pain seems consistent with radiculopathy   MCI (mild cognitive impairment) with memory loss 12/31/2020   Midline cystocele    Mild asthma 08/27/2020   no inhaler   Obstructive sleep apnea 08/06/2021   Intolerant of cpap   Onychomycosis 02/21/2018   Toenails; Ciclopirox  prescribed   Oropharyngeal dysphagia 08/16/2016   Persistent cough 03/17/2019   Primary stress urinary incontinence 10/27/2021   Rib pain    Sciatica    right   Sinus headache 12/15/2022   Sinus infection 03/22/2019   SVD (spontaneous vaginal delivery)    x 2   Tinnitus of right ear 11/16/2016   no current problem as of 08/26/21 per patient   Upper respiratory infection 11/09/2016   Urinary hesitancy 12/31/2020   Viral respiratory infection 12/04/2019   Vitamin B12 deficiency 02/23/2018    Tobacco History: Social History   Tobacco Use  Smoking Status Never  Smokeless Tobacco Never   Counseling given: Not Answered   Outpatient Medications Prior to Visit  Medication Sig Dispense Refill   acetaminophen  (TYLENOL ) 500 MG tablet Take 500 mg by mouth every 6 (six) hours as needed.     albuterol  (VENTOLIN  HFA) 108 (90 Base) MCG/ACT inhaler Inhale 2 puffs into the lungs every 6 (six) hours as needed for wheezing  or shortness of breath. 8 g 6   aspirin  EC 81 MG tablet Take 1 tablet (81 mg total) by mouth in the morning. Swallow whole.     atorvastatin  (LIPITOR) 20 MG tablet Take 1 tablet (20 mg total) by mouth daily. 90 tablet 3   cephALEXin  (KEFLEX ) 500 MG capsule Take 1 capsule (500 mg total) by mouth 4 (four) times daily for 10 days. 40 capsule 0   fluticasone  (FLONASE ) 50 MCG/ACT nasal spray Place 2 sprays into both nostrils daily. 16 g 8   hydrochlorothiazide  (HYDRODIURIL ) 25 MG tablet TAKE 1 TABLET BY MOUTH DAILY 90 tablet 2   loratadine  (CLARITIN ) 10 MG tablet TAKE 1 TABLET BY MOUTH DAILY 30 tablet 2   metoprolol  succinate (TOPROL -XL) 100 MG 24 hr tablet TAKE 1 TABLET BY MOUTH AT BEDTIME 90 tablet 1   promethazine -dextromethorphan (PROMETHAZINE -DM) 6.25-15 MG/5ML syrup Take 5 mLs by mouth 4 (four) times daily as needed for cough. 240 mL 0   vitamin B-12 (CYANOCOBALAMIN ) 50 MCG tablet Take 50 mcg by mouth daily.     colchicine  0.6 MG tablet Take 1 tablet (0.6 mg total) by mouth every 6 (six) hours  as needed (gout pain). (Patient not taking: Reported on 02/08/2024) 60 tablet 0   oxyCODONE  (ROXICODONE ) 5 MG immediate release tablet Take 1 tablet (5 mg total) by mouth every 4 (four) hours as needed for severe pain (pain score 7-10). (Patient not taking: Reported on 02/08/2024) 8 tablet 0   potassium chloride  SA (KLOR-CON  M) 20 MEQ tablet Take 1 tablet (20 mEq total) by mouth daily. (Patient not taking: Reported on 02/08/2024) 90 tablet 3   No facility-administered medications prior to visit.     Review of Systems:   Constitutional:   No  weight loss, night sweats,  Fevers, chills, fatigue, or  lassitude.  HEENT:   No headaches,  Difficulty swallowing,  Tooth/dental problems, or  Sore throat,                No sneezing, itching, ear ache, nasal congestion, post nasal drip,   CV:  No chest pain,  Orthopnea, PND, swelling in lower extremities, anasarca, dizziness, palpitations, syncope.   GI  No  heartburn, indigestion, abdominal pain, nausea, vomiting, diarrhea, change in bowel habits, loss of appetite, bloody stools.   Resp:   No chest wall deformity  Skin: no rash or lesions.  GU: no dysuria, change in color of urine, no urgency or frequency.  No flank pain, no hematuria   MS:  No joint pain or swelling.  No decreased range of motion.  No back pain.    Physical Exam  BP 123/89   Pulse (!) 59   Ht 5' 5.5 (1.664 m)   Wt 186 lb 3.2 oz (84.5 kg)   SpO2 99%   BMI 30.51 kg/m   GEN: A/Ox3; pleasant , NAD, well nourished    HEENT:  [place]/AT, NOSE-clear, THROAT-clear, no lesions, no postnasal drip or exudate noted.   NECK:  Supple w/ fair ROM; no JVD; normal carotid impulses w/o bruits; no thyromegaly or nodules palpated; no lymphadenopathy.    RESP  Clear  P & A; w/o, wheezes/ rales/ or rhonchi. no accessory muscle use, no dullness to percussion  CARD:  RRR, no m/r/g, no peripheral edema, pulses intact, no cyanosis or clubbing.  GI:   Soft & nt; nml bowel sounds; no organomegaly or masses detected.   Musco: Warm bil, no deformities or joint swelling noted.   Neuro: alert, no focal deficits noted.    Skin: Warm, no lesions or rashes    Lab Results:  CBC    Component Value Date/Time   WBC 7.7 02/03/2024 0227   RBC 4.05 02/03/2024 0227   HGB 13.1 02/03/2024 0227   HCT 36.1 02/03/2024 0227   PLT 216 02/03/2024 0227   MCV 89.1 02/03/2024 0227   MCH 32.3 02/03/2024 0227   MCHC 36.3 (H) 02/03/2024 0227   RDW 13.7 02/03/2024 0227   LYMPHSABS 2.1 01/16/2024 1110   MONOABS 0.4 01/16/2024 1110   EOSABS 0.1 01/16/2024 1110   BASOSABS 0.1 01/16/2024 1110    BMET   BNP No results found for: BNP  ProBNP No results found for: PROBNP  Imaging:   Administration History     None          Latest Ref Rng & Units 07/28/2022    9:54 AM 06/04/2019    2:54 PM  PFT Results  FVC-Pre L 2.92  2.63   FVC-Predicted Pre % 80  86   FVC-Post L 2.85  3.06    FVC-Predicted Post % 78  100   Pre FEV1/FVC % % 83  85   Post FEV1/FCV % % 86  86   FEV1-Pre L 2.42  2.23   FEV1-Predicted Pre % 84  91   FEV1-Post L 2.45  2.62   DLCO uncorrected ml/min/mmHg 18.82  19.92   DLCO UNC% % 86  89   DLCO corrected ml/min/mmHg 18.82    DLCO COR %Predicted % 86    DLVA Predicted % 101  106   TLC L 5.84  4.92   TLC % Predicted % 109  92   RV % Predicted % 154  111     No results found for: NITRICOXIDE      Assessment & Plan:   Mild asthma Possible uncontrolled asthma with ongoing cough.  Patient has been tried on Advair and Symbicort  in the past but admits she did not take consistently.  Previous PFTs showed normal lung function-she never smoker.  PFT in 2020 showed bronchodilator responsiveness consistent with underlying asthma.  Will retry ICS/LABA. Add aggressive cough control regimen with Delsym twice daily.  Would take Claritin  and Pepcid  consistently to control for possible triggers of postnasal drainage and reflux.  If symptoms persist on return visit could consider high-resolution CT chest to rule out possible underlying interstitial process.  Previous CTs only showed minimal bibasilar atelectasis/scarring She has been seen by allergy in the past records are unavailable.  Could consider an allergy panel with IgE.  Recent CBC with differential showed absolute eosinophil count at 100  Plan  Patient Instructions  Begin Symbicort  80 2 puffs Twice daily, rinse after use.  Begin Delsym 2 tsp Twice daily  for cough Restart Claritin  10mg  daily  Begin Pepcid  20mg  At bedtime   Albuterol  inhaler As needed   Sips of water  to soothe throat and avoid throat clearing or coughing .  Follow up with Dr. Baldwin Levee  in 6-8 weeks and As needed   Please contact office for sooner follow up if symptoms do not improve or worsen or seek emergency care      Chronic cough Continues to have ongoing cough.  Will treat with aggressive cough control regimen and sugar  control.  Consider HRCT chest on return if no improvement  Plan  Patient Instructions  Begin Symbicort  80 2 puffs Twice daily, rinse after use.  Begin Delsym 2 tsp Twice daily  for cough Restart Claritin  10mg  daily  Begin Pepcid  20mg  At bedtime   Albuterol  inhaler As needed   Sips of water  to soothe throat and avoid throat clearing or coughing .  Follow up with Dr. Baldwin Levee  in 6-8 weeks and As needed   Please contact office for sooner follow up if symptoms do not improve or worsen or seek emergency care        Roena Clark, NP 02/08/2024

## 2024-02-08 NOTE — Patient Instructions (Addendum)
 Begin Symbicort  80 2 puffs Twice daily, rinse after use.  Begin Delsym 2 tsp Twice daily  for cough Restart Claritin  10mg  daily  Begin Pepcid  20mg  At bedtime   Albuterol  inhaler As needed   Sips of water  to soothe throat and avoid throat clearing or coughing .  Follow up with Dr. Baldwin Levee  in 6-8 weeks and As needed   Please contact office for sooner follow up if symptoms do not improve or worsen or seek emergency care

## 2024-02-08 NOTE — Assessment & Plan Note (Signed)
 Possible uncontrolled asthma with ongoing cough.  Patient has been tried on Advair and Symbicort  in the past but admits she did not take consistently.  Previous PFTs showed normal lung function-she never smoker.  PFT in 2020 showed bronchodilator responsiveness consistent with underlying asthma.  Will retry ICS/LABA. Add aggressive cough control regimen with Delsym twice daily.  Would take Claritin  and Pepcid  consistently to control for possible triggers of postnasal drainage and reflux.  If symptoms persist on return visit could consider high-resolution CT chest to rule out possible underlying interstitial process.  Previous CTs only showed minimal bibasilar atelectasis/scarring She has been seen by allergy in the past records are unavailable.  Could consider an allergy panel with IgE.  Recent CBC with differential showed absolute eosinophil count at 100  Plan  Patient Instructions  Begin Symbicort  80 2 puffs Twice daily, rinse after use.  Begin Delsym 2 tsp Twice daily  for cough Restart Claritin  10mg  daily  Begin Pepcid  20mg  At bedtime   Albuterol  inhaler As needed   Sips of water  to soothe throat and avoid throat clearing or coughing .  Follow up with Dr. Baldwin Levee  in 6-8 weeks and As needed   Please contact office for sooner follow up if symptoms do not improve or worsen or seek emergency care

## 2024-02-09 DIAGNOSIS — Z1211 Encounter for screening for malignant neoplasm of colon: Secondary | ICD-10-CM | POA: Diagnosis not present

## 2024-02-09 DIAGNOSIS — K648 Other hemorrhoids: Secondary | ICD-10-CM | POA: Diagnosis not present

## 2024-02-09 DIAGNOSIS — I10 Essential (primary) hypertension: Secondary | ICD-10-CM | POA: Diagnosis not present

## 2024-02-09 DIAGNOSIS — K635 Polyp of colon: Secondary | ICD-10-CM | POA: Diagnosis not present

## 2024-02-28 ENCOUNTER — Ambulatory Visit: Payer: Self-pay

## 2024-02-28 NOTE — Telephone Encounter (Signed)
 FYI Only or Action Required?: FYI only for provider.  Patient was last seen in primary care on 01/16/2024 by Geofm Glade PARAS, MD.  Called Nurse Triage reporting Bleeding/Bruising.  Symptoms began yesterday.  Interventions attempted: Nothing. Advised patient to use cold pack/ice 10 minutes three times a day.  Symptoms are: unexplained left inner thigh bruise about 3-4 inches long, headaches unchanged.  Triage Disposition: See Within 3 Days in Office  Patient/caregiver understands and will follow disposition?: Yes            1. APPEARANCE of BRUISE: Describe the bruise.  Getting purple colored, no swelling or knots under the skin.  2. SIZE: How large is the bruise?  3-4 inches long.  3. NUMBER: How many bruises are there? 1.   4. LOCATION: Where is the bruise located?  Left inner thigh.  5. ONSET: How long ago did the bruise occur?  She states she just noticed it yesterday.  6. CAUSE: What do you think caused the bruise? Patient denies any injuries that may have caused the bruising but is concerned it is medications causing her bruise.  7. MEDICAL HISTORY: Do you have any medical problems that can cause easy bruising or bleeding? (e.g., leukemia, liver disease, recent chemotherapy) No.  8. MEDICINES: Do you take any medicines which thin the blood such as: aspirin , apixaban, heparin, ibuprofen  (NSAIDS), Plavix, or Coumadin? Aspirin  325mg .  9. OTHER SYMPTOMS: Do you have any other symptoms? (e.g., weakness, dizziness, pain, fever, nosebleed, blood in urine/stool) Headaches x couple of days, Patient denies dizziness, bleeding, fever, or pain.  10. PREGNANCY: Is there any chance you are pregnant? When was your last menstrual period?  N/A.        Copied from CRM [id number]. Topic: Clinical - Red Word Triage >> Feb 28, 2024  2:23 PM Precious C wrote: Red Word that prompted transfer to Nurse Triage: INJURY/BRUISE  Patient called stating she has  a bruise on her left inner thigh and does not recall bumping into anything. She is currently on multiple medications and would like to get checked to ensure everything is okay.

## 2024-03-01 ENCOUNTER — Encounter: Payer: Self-pay | Admitting: Internal Medicine

## 2024-03-01 ENCOUNTER — Ambulatory Visit (INDEPENDENT_AMBULATORY_CARE_PROVIDER_SITE_OTHER): Admitting: Internal Medicine

## 2024-03-01 VITALS — BP 128/88 | HR 67 | Temp 97.9°F | Ht 65.5 in | Wt 188.2 lb

## 2024-03-01 DIAGNOSIS — L989 Disorder of the skin and subcutaneous tissue, unspecified: Secondary | ICD-10-CM | POA: Diagnosis not present

## 2024-03-01 DIAGNOSIS — R739 Hyperglycemia, unspecified: Secondary | ICD-10-CM | POA: Diagnosis not present

## 2024-03-01 DIAGNOSIS — I83893 Varicose veins of bilateral lower extremities with other complications: Secondary | ICD-10-CM | POA: Diagnosis not present

## 2024-03-01 DIAGNOSIS — J4531 Mild persistent asthma with (acute) exacerbation: Secondary | ICD-10-CM | POA: Diagnosis not present

## 2024-03-01 NOTE — Assessment & Plan Note (Signed)
Stable overall, to continue inhaler prn °

## 2024-03-01 NOTE — Assessment & Plan Note (Signed)
 Lab Results  Component Value Date   HGBA1C 5.3 01/16/2024   Stable, pt to continue current medical treatment  - diet, wt control

## 2024-03-01 NOTE — Assessment & Plan Note (Signed)
 Left medial thigh at distal start of area of varicosities - exam c/w likely benign lesion, but consider dermatology for any worsening.

## 2024-03-01 NOTE — Progress Notes (Signed)
 Patient ID: Kathleen Solis, female   DOB: 24-Jan-1966, 58 y.o.   MRN: [id number]        Chief Complaint: follow up left leg skin lesion       HPI:  Kathleen Solis is a 58 y.o. female here with mention of new finding in the past wk of dark marks and skin lesion to the left medial upper leg, fortunately without any pain, fever, swelling or recent trauma.  Cannot recall scratching at the lesion.  Does tend to stand quite a bit daily, has had difficulty losing wt, and already has multiple similar superficial varicosites to the right mid lateral thigh.  Pt denies chest pain, increased sob or doe, wheezing, orthopnea, PND, increased LE swelling, palpitations, dizziness or syncope.       Wt Readings from Last 3 Encounters:  03/01/24 188 lb 3.2 oz (85.4 kg)  02/08/24 186 lb 3.2 oz (84.5 kg)  01/16/24 191 lb (86.6 kg)   BP Readings from Last 3 Encounters:  03/01/24 128/88  02/08/24 123/89  02/03/24 (!) 110/90         Past Medical History:  Diagnosis Date   Allergic rhinitis 08/16/2016   Benign essential hypertension 03/03/2015   Bone spur of right foot 02/08/2016   Bunion of left foot 02/08/2016   Carotid arterial disease 04/10/2016   03/2016: right 40-59%, left < 40%; 10/2020 carotid US : No evidence of B ICA stenosis   Cerebrovascular accident (CVA)    MRI - Chronic hyperintensity left frontal parietal white matter unchanged likely due to chronic ischemia.   Chest pain 02/14/2017   Cyst of left ovary 03/03/2015   Diverticulitis of colon without hemorrhage 02/25/2016   Epigastric pain 05/06/2020   Episodic tension-type headache 01/12/2016   Taking motrin  200 mg bid   GERD (gastroesophageal reflux disease)    High-tone pelvic floor dysfunction 10/27/2021   History of COVID-19 05/18/2021   History of gout 06/23/2022   Hyperglycemia 02/14/2017   Chronic; encouraged low sugar/carbohydrate diet and regular exercise   Hyperlipidemia    diet controlled - no med   Left upper quadrant pain     Leg neuralgia, right    Related to CVA    Lower respiratory infection 07/18/2018   Lumbar back pain 05/10/2018   Lumbar radiculopathy 05/15/2018   Pain seems consistent with radiculopathy   MCI (mild cognitive impairment) with memory loss 12/31/2020   Midline cystocele    Mild asthma 08/27/2020   no inhaler   Obstructive sleep apnea 08/06/2021   Intolerant of cpap   Onychomycosis 02/21/2018   Toenails; Ciclopirox  prescribed   Oropharyngeal dysphagia 08/16/2016   Persistent cough 03/17/2019   Primary stress urinary incontinence 10/27/2021   Rib pain    Sciatica    right   Sinus headache 12/15/2022   Sinus infection 03/22/2019   SVD (spontaneous vaginal delivery)    x 2   Tinnitus of right ear 11/16/2016   no current problem as of 08/26/21 per patient   Upper respiratory infection 11/09/2016   Urinary hesitancy 12/31/2020   Viral respiratory infection 12/04/2019   Vitamin B12 deficiency 02/23/2018   Past Surgical History:  Procedure Laterality Date   ABDOMINAL HYSTERECTOMY     partial   ANAL FISSURE REPAIR     APPENDECTOMY     BRONCHIAL WASHINGS  08/30/2021   Procedure: BRONCHIAL WASHINGS;  Surgeon: Shelah Lamar RAMAN, MD;  Location: [facility] ENDOSCOPY;  Service: Cardiopulmonary;;   CHOLECYSTECTOMY     CYSTOSCOPY N/A 10/04/2017  Procedure: CYSTOSCOPY;  Surgeon: Rosalva Sawyer, MD;  Location: [facility] ORS;  Service: Gynecology;  Laterality: N/A;   FOOT SURGERY Left    big toe bunion removed   LAPAROSCOPIC BILATERAL SALPINGO OOPHERECTOMY Bilateral 10/04/2017   Procedure: LAPAROSCOPIC BILATERAL SALPINGO OOPHORECTOMY, PELVIC WASHINGS;  Surgeon: Rosalva Sawyer, MD;  Location: [facility] ORS;  Service: Gynecology;  Laterality: Bilateral;   SACRAL COLPOPEXY ROBOTIC, posterior repair, cystoscopy  07/04/2019   @ [facility]   TUBAL LIGATION     VIDEO BRONCHOSCOPY N/A 08/30/2021   Procedure: VIDEO BRONCHOSCOPY WITHOUT FLUORO;  Surgeon: Shelah Lamar RAMAN, MD;  Location: [facility] ENDOSCOPY;  Service: Cardiopulmonary;   Laterality: N/A;   WISDOM TOOTH EXTRACTION      reports that she has never smoked. She has never used smokeless tobacco. She reports that she does not drink alcohol and does not use drugs. family history includes Anuerysm in her cousin; Anuerysm (age of onset: 39) in her mother; Breast cancer in her cousin, niece, and sister; Cataracts in her sister and sister; Cerebral palsy in her son; Developmental delay in her nephew; Diabetes in her sister; Esophageal cancer in her nephew; Glaucoma in her sister; Hypertension in her sister; Pancreatic cancer in her father; Retinal detachment in her brother. Allergies  Allergen Reactions   Benzonatate  Other (See Comments)    Headaches   Darvon [Propoxyphene] Other (See Comments)    Gi problems   Ibuprofen      tinnitus   Morphine And Codeine  Itching   Prednisone  Other (See Comments)    Make her head hurt   Sulfa Antibiotics Hives   Tussionex Pennkinetic Er [Hydrocod Poli-Chlorphe Poli Er] Other (See Comments)    Nausea, vomiting   Buprenorphine Hcl Itching   Ivp Dye [Iodinated Contrast Media] Nausea And Vomiting   Prochlorperazine Edisylate Itching and Other (See Comments)    Acts weird: Hallucinations   Current Outpatient Medications on File Prior to Visit  Medication Sig Dispense Refill   acetaminophen  (TYLENOL ) 500 MG tablet Take 500 mg by mouth every 6 (six) hours as needed.     albuterol  (VENTOLIN  HFA) 108 (90 Base) MCG/ACT inhaler Inhale 2 puffs into the lungs every 6 (six) hours as needed for wheezing or shortness of breath. 8 g 6   aspirin  EC 81 MG tablet Take 1 tablet (81 mg total) by mouth in the morning. Swallow whole.     atorvastatin  (LIPITOR) 20 MG tablet Take 1 tablet (20 mg total) by mouth daily. 90 tablet 3   budesonide -formoterol  (SYMBICORT ) 80-4.5 MCG/ACT inhaler Inhale 2 puffs into the lungs 2 (two) times daily. 1 each 12   fluticasone  (FLONASE ) 50 MCG/ACT nasal spray Place 2 sprays into both nostrils daily. 16 g 8    hydrochlorothiazide  (HYDRODIURIL ) 25 MG tablet TAKE 1 TABLET BY MOUTH DAILY 90 tablet 2   loratadine  (CLARITIN ) 10 MG tablet TAKE 1 TABLET BY MOUTH DAILY 30 tablet 2   metoprolol  succinate (TOPROL -XL) 100 MG 24 hr tablet TAKE 1 TABLET BY MOUTH AT BEDTIME 90 tablet 1   promethazine -dextromethorphan (PROMETHAZINE -DM) 6.25-15 MG/5ML syrup Take 5 mLs by mouth 4 (four) times daily as needed for cough. 240 mL 0   vitamin B-12 (CYANOCOBALAMIN ) 50 MCG tablet Take 50 mcg by mouth daily.     colchicine  0.6 MG tablet Take 1 tablet (0.6 mg total) by mouth every 6 (six) hours as needed (gout pain). (Patient not taking: Reported on 03/01/2024) 60 tablet 0   oxyCODONE  (ROXICODONE ) 5 MG immediate release tablet Take 1 tablet (5 mg total)  by mouth every 4 (four) hours as needed for severe pain (pain score 7-10). (Patient not taking: Reported on 03/01/2024) 8 tablet 0   potassium chloride  SA (KLOR-CON  M) 20 MEQ tablet Take 1 tablet (20 mEq total) by mouth daily. (Patient not taking: Reported on 03/01/2024) 90 tablet 3   No current facility-administered medications on file prior to visit.        ROS:  All others reviewed and negative.  Objective        PE:  BP 128/88   Pulse 67   Temp 97.9 F (36.6 C)   Ht 5' 5.5 (1.664 m)   Wt 188 lb 3.2 oz (85.4 kg)   SpO2 96%   BMI 30.84 kg/m                 Constitutional: Pt appears in NAD               HENT: Head: NCAT.                Right Ear: External ear normal.                 Left Ear: External ear normal.                Eyes: . Pupils are equal, round, and reactive to light. Conjunctivae and EOM are normal               Nose: without d/c or deformity               Neck: Neck supple. Gross normal ROM               Cardiovascular: Normal rate and regular rhythm.                 Pulmonary/Chest: Effort normal and breath sounds without rales or wheezing.                Abd:  Soft, NT, ND, + BS, no organomegaly               Neurological: Pt is alert. At  baseline orientation, motor grossly intact               Skin: Skin is warm. LE edema - none; left medial thigh with 4 cm area in linear fashion of superficial varicosities, with end aspect with < 1/2 cm papular brown skin lesion noted, LLE without edema, and RLE with trace edema               Psychiatric: Pt behavior is normal without agitation   Micro: none  Cardiac tracings I have personally interpreted today:  none  Pertinent Radiological findings (summarize): none   Lab Results  Component Value Date   WBC 7.7 02/03/2024   HGB 13.1 02/03/2024   HCT 36.1 02/03/2024   PLT 216 02/03/2024   GLUCOSE 101 (H) 02/03/2024   CHOL 128 01/16/2024   TRIG 73.0 01/16/2024   HDL 46.90 01/16/2024   LDLCALC 67 01/16/2024   ALT 16 02/03/2024   AST 26 02/03/2024   NA 141 02/03/2024   K 3.1 (L) 02/03/2024   CL 102 02/03/2024   CREATININE 0.80 02/03/2024   BUN 20 02/03/2024   CO2 27 02/03/2024   TSH 1.58 04/28/2023   INR 1.18 11/24/2015   HGBA1C 5.3 01/16/2024   Assessment/Plan:  Kathleen Solis is a 58 y.o. Black or African American [2] female with  has a past medical history of Allergic  rhinitis (08/16/2016), Benign essential hypertension (03/03/2015), Bone spur of right foot (02/08/2016), Bunion of left foot (02/08/2016), Carotid arterial disease (04/10/2016), Cerebrovascular accident (CVA), Chest pain (02/14/2017), Cyst of left ovary (03/03/2015), Diverticulitis of colon without hemorrhage (02/25/2016), Epigastric pain (05/06/2020), Episodic tension-type headache (01/12/2016), GERD (gastroesophageal reflux disease), High-tone pelvic floor dysfunction (10/27/2021), History of COVID-19 (05/18/2021), History of gout (06/23/2022), Hyperglycemia (02/14/2017), Hyperlipidemia, Left upper quadrant pain, Leg neuralgia, right, Lower respiratory infection (07/18/2018), Lumbar back pain (05/10/2018), Lumbar radiculopathy (05/15/2018), MCI (mild cognitive impairment) with memory loss (12/31/2020), Midline  cystocele, Mild asthma (08/27/2020), Obstructive sleep apnea (08/06/2021), Onychomycosis (02/21/2018), Oropharyngeal dysphagia (08/16/2016), Persistent cough (03/17/2019), Primary stress urinary incontinence (10/27/2021), Rib pain, Sciatica, Sinus headache (12/15/2022), Sinus infection (03/22/2019), SVD (spontaneous vaginal delivery), Tinnitus of right ear (11/16/2016), Upper respiratory infection (11/09/2016), Urinary hesitancy (12/31/2020), Viral respiratory infection (12/04/2019), and Vitamin B12 deficiency (02/23/2018).  Varicose veins of both legs with edema D/w pt - lesions c/w varicosities, pt for exercise, wt control, low salt diet, leg elevation with sitting, and consider compression stockings for any worsening edema  Skin lesion of left leg Left medial thigh at distal start of area of varicosities - exam c/w likely benign lesion, but consider dermatology for any worsening.  Mild asthma Stable overall, to continue inhaler prn  Hyperglycemia Lab Results  Component Value Date   HGBA1C 5.3 01/16/2024   Stable, pt to continue current medical treatment  - diet, wt control  Followup: Return if symptoms worsen or fail to improve.  Lynwood Rush, MD 03/01/2024 10:14 AM [facility]

## 2024-03-01 NOTE — Assessment & Plan Note (Signed)
 D/w pt - lesions c/w varicosities, pt for exercise, wt control, low salt diet, leg elevation with sitting, and consider compression stockings for any worsening edema

## 2024-03-01 NOTE — Patient Instructions (Signed)
 Ok to continue to monitor the marks and skin spot to the left leg for now  Please consider dermatology for any worsening  Please continue all other medications as before, and refills have been done if requested.  Please have the pharmacy call with any other refills you may need.  Please keep your appointments with your specialists as you may have planned

## 2024-03-11 ENCOUNTER — Other Ambulatory Visit: Payer: Self-pay | Admitting: Emergency Medicine

## 2024-03-11 MED ORDER — PROMETHAZINE-DM 6.25-15 MG/5ML PO SYRP: 5.0000 mL | ORAL_SOLUTION | Freq: Four times a day (QID) | ORAL | 0 refills | Status: DC | PRN

## 2024-03-11 NOTE — Telephone Encounter (Signed)
 Per chart last rx was 02/02/24  Please advise, thank you!

## 2024-03-11 NOTE — Telephone Encounter (Signed)
 Copied from CRM [id number]. Topic: Clinical - Medication Refill >> Mar 11, 2024  8:49 AM Nathanel DEL wrote: Medication: promethazine -dextromethorphan (PROMETHAZINE -DM) 6.25-15 MG/5ML syrup  Has the patient contacted their pharmacy? No (Agent: If no, request that the patient contact the pharmacy for the refill. If patient does not wish to contact the pharmacy document the reason why and proceed with request.) (Agent: If yes, when and what did the pharmacy advise?)  This is the patient's preferred pharmacy:  [facility] [id number] - [place], [place] - [address] [place] [address] Phone: [phone] Fax: [phone]   Is this the correct pharmacy for this prescription? Yes If no, delete pharmacy and type the correct one.   Has the prescription been filled recently? Yes  Is the patient out of the medication? Yes  Has the patient been seen for an appointment in the last year OR does the patient have an upcoming appointment? Yes  Can we respond through MyChart? No  Agent: Please be advised that Rx refills may take up to 3 business days. We ask that you follow-up with your pharmacy.

## 2024-04-03 DIAGNOSIS — R197 Diarrhea, unspecified: Secondary | ICD-10-CM | POA: Diagnosis not present

## 2024-04-03 DIAGNOSIS — R519 Headache, unspecified: Secondary | ICD-10-CM | POA: Diagnosis not present

## 2024-04-15 ENCOUNTER — Other Ambulatory Visit: Payer: Self-pay | Admitting: Emergency Medicine

## 2024-04-15 NOTE — Telephone Encounter (Unsigned)
 Copied from CRM #[id number]. Topic: Clinical - Medication Refill >> Apr 15, 2024 11:22 AM Benton O wrote: Medication: promethazine -dextromethorphan (PROMETHAZINE -DM) 6.25-15 MG/5ML syrup [[id number]]  Has the patient contacted their pharmacy? No contacted doctor first doctors orders (Agent: If no, request that the patient contact the pharmacy for the refill. If patient does not wish to contact the pharmacy document the reason why and proceed with request.) (Agent: If yes, when and what did the pharmacy advise?)  This is the patient's preferred pharmacy:  [facility] [id number] - [place], [place] - [address] [place] [address] Phone: [phone] Fax: [phone]  [facility] [place], [place] - [address] [place] [address] Phone: [phone] Fax: [phone]  Is this the correct pharmacy for this prescription? Yes If no, delete pharmacy and type the correct one.  [facility] [id number] [place], [place] - [address] [place] [address] Phone: [phone] Fax: [phone]    Has the prescription been filled recently? No  Is the patient out of the medication? No couple days left didn't want meds to run out   Has the patient been seen for an appointment in the last year OR does the patient have an upcoming appointment? Yes 11/21   Can we respond through MyChart? Yes  Agent: Please be advised that Rx refills may take up to 3 business days. We ask that you follow-up with your pharmacy.

## 2024-04-16 MED ORDER — PROMETHAZINE-DM 6.25-15 MG/5ML PO SYRP: 5.0000 mL | ORAL_SOLUTION | Freq: Four times a day (QID) | ORAL | 0 refills | Status: DC | PRN

## 2024-04-17 DIAGNOSIS — R509 Fever, unspecified: Secondary | ICD-10-CM | POA: Diagnosis not present

## 2024-04-17 DIAGNOSIS — R0989 Other specified symptoms and signs involving the circulatory and respiratory systems: Secondary | ICD-10-CM | POA: Diagnosis not present

## 2024-04-19 DIAGNOSIS — R1114 Bilious vomiting: Secondary | ICD-10-CM | POA: Diagnosis not present

## 2024-04-19 DIAGNOSIS — J069 Acute upper respiratory infection, unspecified: Secondary | ICD-10-CM | POA: Diagnosis not present

## 2024-04-22 ENCOUNTER — Other Ambulatory Visit: Payer: Self-pay | Admitting: Internal Medicine

## 2024-05-13 DIAGNOSIS — R051 Acute cough: Secondary | ICD-10-CM | POA: Diagnosis not present

## 2024-05-13 DIAGNOSIS — R079 Chest pain, unspecified: Secondary | ICD-10-CM | POA: Diagnosis not present

## 2024-05-13 DIAGNOSIS — R52 Pain, unspecified: Secondary | ICD-10-CM | POA: Diagnosis not present

## 2024-05-13 DIAGNOSIS — R519 Headache, unspecified: Secondary | ICD-10-CM | POA: Diagnosis not present

## 2024-05-15 DIAGNOSIS — G8929 Other chronic pain: Secondary | ICD-10-CM | POA: Diagnosis not present

## 2024-05-15 DIAGNOSIS — R519 Headache, unspecified: Secondary | ICD-10-CM | POA: Diagnosis not present

## 2024-05-16 ENCOUNTER — Encounter: Payer: Self-pay | Admitting: Emergency Medicine

## 2024-05-16 ENCOUNTER — Ambulatory Visit (INDEPENDENT_AMBULATORY_CARE_PROVIDER_SITE_OTHER): Admitting: Emergency Medicine

## 2024-05-16 VITALS — BP 122/80 | HR 58 | Temp 97.9°F | Ht 65.5 in | Wt 187.8 lb

## 2024-05-16 DIAGNOSIS — B9789 Other viral agents as the cause of diseases classified elsewhere: Secondary | ICD-10-CM

## 2024-05-16 DIAGNOSIS — J988 Other specified respiratory disorders: Secondary | ICD-10-CM

## 2024-05-16 DIAGNOSIS — R6889 Other general symptoms and signs: Secondary | ICD-10-CM

## 2024-05-16 LAB — POCT INFLUENZA A/B
Influenza A, POC: NEGATIVE
Influenza B, POC: NEGATIVE

## 2024-05-16 LAB — POC COVID19 BINAXNOW: SARS Coronavirus 2 Ag: NEGATIVE

## 2024-05-16 MED ORDER — HYDROCODONE-ACETAMINOPHEN 5-325 MG PO TABS: 1.0000 | ORAL_TABLET | Freq: Four times a day (QID) | ORAL | 0 refills | Status: DC | PRN

## 2024-05-16 NOTE — Assessment & Plan Note (Signed)
 Symptom management discussed Advised to rest and stay well-hydrated Pain management discussed Advised to contact the office if no better or worse during the next several days.

## 2024-05-16 NOTE — Patient Instructions (Signed)

## 2024-05-16 NOTE — Assessment & Plan Note (Signed)
 Clinically stable.  No red flag signs or symptoms. Running its course without complications Symptom management discussed Negative COVID and flu tests Mostly complaining of headache and bodyaches Pain management discussed Recommend Tylenol  for mild to moderate pain and Norco for moderate to severe pain Advised to contact the office if no better or worse during the next several days.

## 2024-05-16 NOTE — Progress Notes (Signed)
 Kathleen Solis 58 y.o.   Chief Complaint  Patient presents with   Cough    Cough, headache, sore throat, allergies     HISTORY OF PRESENT ILLNESS: Acute problem visit today.  Patient of Dr. Glade Hope. This is a 58 y.o. female complaining of flulike symptoms that started last Monday 4 days ago.  Complaining of headache, ears clogged, sore throat, sweaty and achy.  Family members also sick with similar symptoms No other associated symptomatology No other complaints or medical concerns today.   Cough Associated symptoms include ear pain, headaches and a sore throat. Pertinent negatives include no chest pain, chills, fever, rash or shortness of breath.     Prior to Admission medications   Medication Sig Start Date End Date Taking? Authorizing Provider  acetaminophen  (TYLENOL ) 500 MG tablet Take 500 mg by mouth every 6 (six) hours as needed.   Yes [provider]  atorvastatin  (LIPITOR) 20 MG tablet Take 1 tablet (20 mg total) by mouth daily. 12/28/22  Yes Burns, Glade PARAS, MD  fluticasone  (FLONASE ) 50 MCG/ACT nasal spray Place 2 sprays into both nostrils daily. 12/28/23  Yes Shelah Lamar RAMAN, MD  hydrochlorothiazide  (HYDRODIURIL ) 25 MG tablet TAKE 1 TABLET BY MOUTH DAILY 01/22/24  Yes Burns, Glade PARAS, MD  metoprolol  succinate (TOPROL -XL) 100 MG 24 hr tablet TAKE 1 TABLET BY MOUTH AT BEDTIME 04/23/24  Yes Burns, Glade PARAS, MD  promethazine -dextromethorphan (PROMETHAZINE -DM) 6.25-15 MG/5ML syrup Take 5 mLs by mouth 4 (four) times daily as needed for cough. 04/16/24  Yes Shelah Lamar RAMAN, MD  vitamin B-12 (CYANOCOBALAMIN ) 50 MCG tablet Take 50 mcg by mouth daily.   Yes [provider]    Allergies  Allergen Reactions   Benzonatate  Other (See Comments)    Headaches   Darvon [Propoxyphene] Other (See Comments)    Gi problems   Ibuprofen      tinnitus   Morphine And Codeine  Itching   Prednisone  Other (See Comments)    Make her head hurt   Sulfa Antibiotics Hives   Tussionex  Pennkinetic Er [Hydrocod Poli-Chlorphe Poli Er] Other (See Comments)    Nausea, vomiting   Buprenorphine Hcl Itching   Ivp Dye [Iodinated Contrast Media] Nausea And Vomiting   Prochlorperazine Edisylate Itching and Other (See Comments)    Acts weird: Hallucinations    Patient Active Problem List   Diagnosis Date Noted   Skin lesion of left leg 03/01/2024   Varicose veins of both legs with edema 03/01/2024   Epistaxis 08/02/2023   Abnormal CT of the chest 06/08/2023   Fatigue 04/28/2023   Left leg numbness 02/14/2023   Hypokalemia 02/14/2023   Hair loss 12/28/2022   Chronic headaches 12/15/2022   History of gout 06/23/2022   High-tone pelvic floor dysfunction 10/27/2021   Primary stress urinary incontinence 10/27/2021   Obstructive sleep apnea 08/06/2021   History of COVID-19 05/18/2021   Urinary hesitancy 12/31/2020   MCI (mild cognitive impairment) with memory loss 12/31/2020   Midline cystocele    Mild asthma 08/27/2020   Lumbar radiculopathy 05/15/2018   Chronic cough 05/08/2018   Vitamin B12 deficiency 02/23/2018   Onychomycosis 02/21/2018   Hyperlipidemia 02/14/2017   Hyperglycemia 02/14/2017   URI (upper respiratory infection) 11/09/2016   Allergic rhinitis 08/16/2016   Carotid arterial disease 04/10/2016   Diverticulitis of colon without hemorrhage 02/25/2016   H/O: CVA (cerebrovascular accident)    Benign essential hypertension 03/03/2015   Cyst of left ovary 03/03/2015    Past Medical History:  Diagnosis Date   Allergic rhinitis 08/16/2016   Benign essential hypertension 03/03/2015   Bone spur of right foot 02/08/2016   Bunion of left foot 02/08/2016   Carotid arterial disease 04/10/2016   03/2016: right 40-59%, left < 40%; 10/2020 carotid US : No evidence of B ICA stenosis   Cerebrovascular accident (CVA)    MRI - Chronic hyperintensity left frontal parietal white matter unchanged likely due to chronic ischemia.   Chest pain 02/14/2017   Cyst of left  ovary 03/03/2015   Diverticulitis of colon without hemorrhage 02/25/2016   Epigastric pain 05/06/2020   Episodic tension-type headache 01/12/2016   Taking motrin  200 mg bid   GERD (gastroesophageal reflux disease)    High-tone pelvic floor dysfunction 10/27/2021   History of COVID-19 05/18/2021   History of gout 06/23/2022   Hyperglycemia 02/14/2017   Chronic; encouraged low sugar/carbohydrate diet and regular exercise   Hyperlipidemia    diet controlled - no med   Left upper quadrant pain    Leg neuralgia, right    Related to CVA    Lower respiratory infection 07/18/2018   Lumbar back pain 05/10/2018   Lumbar radiculopathy 05/15/2018   Pain seems consistent with radiculopathy   MCI (mild cognitive impairment) with memory loss 12/31/2020   Midline cystocele    Mild asthma 08/27/2020   no inhaler   Obstructive sleep apnea 08/06/2021   Intolerant of cpap   Onychomycosis 02/21/2018   Toenails; Ciclopirox  prescribed   Oropharyngeal dysphagia 08/16/2016   Persistent cough 03/17/2019   Primary stress urinary incontinence 10/27/2021   Rib pain    Sciatica    right   Sinus headache 12/15/2022   Sinus infection 03/22/2019   SVD (spontaneous vaginal delivery)    x 2   Tinnitus of right ear 11/16/2016   no current problem as of 08/26/21 per patient   Upper respiratory infection 11/09/2016   Urinary hesitancy 12/31/2020   Viral respiratory infection 12/04/2019   Vitamin B12 deficiency 02/23/2018    Past Surgical History:  Procedure Laterality Date   ABDOMINAL HYSTERECTOMY     partial   ANAL FISSURE REPAIR     APPENDECTOMY     BRONCHIAL WASHINGS  08/30/2021   Procedure: BRONCHIAL WASHINGS;  Surgeon: Shelah Lamar RAMAN, MD;  Location: [facility] ENDOSCOPY;  Service: Cardiopulmonary;;   CHOLECYSTECTOMY     CYSTOSCOPY N/A 10/04/2017   Procedure: PHYLLIS;  Surgeon: Rosalva Sawyer, MD;  Location: [facility] ORS;  Service: Gynecology;  Laterality: N/A;   FOOT SURGERY Left    big toe bunion removed    LAPAROSCOPIC BILATERAL SALPINGO OOPHERECTOMY Bilateral 10/04/2017   Procedure: LAPAROSCOPIC BILATERAL SALPINGO OOPHORECTOMY, PELVIC WASHINGS;  Surgeon: Rosalva Sawyer, MD;  Location: [facility] ORS;  Service: Gynecology;  Laterality: Bilateral;   SACRAL COLPOPEXY ROBOTIC, posterior repair, cystoscopy  07/04/2019   @ [facility]   TUBAL LIGATION     VIDEO BRONCHOSCOPY N/A 08/30/2021   Procedure: VIDEO BRONCHOSCOPY WITHOUT FLUORO;  Surgeon: Shelah Lamar RAMAN, MD;  Location: [facility] ENDOSCOPY;  Service: Cardiopulmonary;  Laterality: N/A;   WISDOM TOOTH EXTRACTION      Social History   Socioeconomic History   Marital status: Single    Spouse name: Not on file   Number of children: 2   Years of education: 14   Highest education level: Associate degree: occupational, Scientist, product/process development, or vocational program  Occupational History   Occupation: Disability  Tobacco Use   Smoking status: Never   Smokeless tobacco: Never  Vaping Use   Vaping status: Never  Used  Substance and Sexual Activity   Alcohol use: No    Alcohol/week: 0.0 standard drinks of alcohol   Drug use: No   Sexual activity: Not on file    Comment: Hysterectomy  Other Topics Concern   Not on file  Social History Narrative   Yes to regular exercise at [facility]      Pt is right handed, she occasionally drinks tea, walks QOD. She lives with her 60 yr old son, he has mild cerebral palsy.   Social Drivers of Corporate investment banker Strain: Low Risk  (05/15/2024)   Overall Financial Resource Strain (CARDIA)    Difficulty of Paying Living Expenses: Not hard at all  Food Insecurity: No Food Insecurity (05/15/2024)   Hunger Vital Sign    Worried About Running Out of Food in the Last Year: Never true    Ran Out of Food in the Last Year: Never true  Transportation Needs: No Transportation Needs (05/15/2024)   PRAPARE - Administrator, Civil Service (Medical): No    Lack of Transportation (Non-Medical): No  Physical Activity: Insufficiently Active  (05/15/2024)   Exercise Vital Sign    Days of Exercise per Week: 2 days    Minutes of Exercise per Session: 60 min  Stress: No Stress Concern Present (05/15/2024)   [employer] of Occupational Health - Occupational Stress Questionnaire    Feeling of Stress: Not at all  Social Connections: Moderately Isolated (05/15/2024)   Social Connection and Isolation Panel    Frequency of Communication with Friends and Family: More than three times a week    Frequency of Social Gatherings with Friends and Family: Once a week    Attends Religious Services: 1 to 4 times per year    Active Member of [employer] or Organizations: No    Attends Engineer, structural: Not on file    Marital Status: Divorced  Intimate Partner Violence: Unknown (06/15/2023)   Received from [facility]   HITS    Physically Hurt: Not on file    Insult or Talk Down To: Not on file    Threaten Physical Harm: Not on file    Scream or Curse: Not on file    Family History  Problem Relation Age of Onset   Breast cancer Sister    Diabetes Sister    Glaucoma Sister    Cataracts Sister    Hypertension Sister    Anuerysm Mother 10   Pancreatic cancer Father    Retinal detachment Brother    Cataracts Sister    Breast cancer Cousin    Anuerysm Cousin    Breast cancer Niece    Esophageal cancer Nephew    Developmental delay Nephew    Cerebral palsy Son        mild   Colon polyps Neg Hx    Prostate cancer Neg Hx    Rectal cancer Neg Hx    Stomach cancer Neg Hx    Uterine cancer Neg Hx      Review of Systems  Constitutional:  Positive for malaise/fatigue. Negative for chills and fever.  HENT:  Positive for congestion, ear pain and sore throat.   Respiratory:  Positive for cough. Negative for sputum production and shortness of breath.   Cardiovascular: Negative.  Negative for chest pain and palpitations.  Gastrointestinal:  Negative for abdominal pain, diarrhea, nausea and vomiting.  Genitourinary: Negative.   Negative for dysuria and hematuria.  Skin: Negative.  Negative for rash.  Neurological:  Positive for headaches. Negative for dizziness.  All other systems reviewed and are negative.   Vitals:   05/16/24 0832  BP: 122/80  Pulse: (!) 58  Temp: 97.9 F (36.6 C)  SpO2: 97%    Physical Exam Vitals reviewed.  Constitutional:      Appearance: Normal appearance.  HENT:     Head: Normocephalic.     Right Ear: Tympanic membrane, ear canal and external ear normal.     Left Ear: Tympanic membrane, ear canal and external ear normal.     Mouth/Throat:     Mouth: Mucous membranes are moist.     Pharynx: Oropharynx is clear.  Eyes:     Extraocular Movements: Extraocular movements intact.     Conjunctiva/sclera: Conjunctivae normal.     Pupils: Pupils are equal, round, and reactive to light.  Cardiovascular:     Rate and Rhythm: Normal rate and regular rhythm.     Pulses: Normal pulses.     Heart sounds: Normal heart sounds.  Pulmonary:     Effort: Pulmonary effort is normal.     Breath sounds: Normal breath sounds.  Abdominal:     Palpations: Abdomen is soft.     Tenderness: There is no abdominal tenderness.  Musculoskeletal:     Cervical back: No tenderness.  Lymphadenopathy:     Cervical: No cervical adenopathy.  Skin:    General: Skin is warm and dry.     Capillary Refill: Capillary refill takes less than 2 seconds.  Neurological:     General: No focal deficit present.     Mental Status: She is alert and oriented to person, place, and time.  Psychiatric:        Mood and Affect: Mood normal.        Behavior: Behavior normal.    Results for orders placed or performed in visit on 05/16/24 (from the past 24 hours)  POC COVID-19     Status: Normal   Collection Time: 05/16/24  9:43 AM  Result Value Ref Range   SARS Coronavirus 2 Ag Negative Negative  POCT Influenza A/B     Status: Normal   Collection Time: 05/16/24  9:43 AM  Result Value Ref Range   Influenza A, POC  Negative Negative   Influenza B, POC Negative Negative     ASSESSMENT & PLAN: Problem List Items Addressed This Visit       Respiratory   Viral respiratory infection - Primary   Clinically stable.  No red flag signs or symptoms. Running its course without complications Symptom management discussed Negative COVID and flu tests Mostly complaining of headache and bodyaches Pain management discussed Recommend Tylenol  for mild to moderate pain and Norco for moderate to severe pain Advised to contact the office if no better or worse during the next several days.      Relevant Medications   HYDROcodone -acetaminophen  (NORCO/VICODIN) 5-325 MG tablet   Other Relevant Orders   POC COVID-19   POCT Influenza A/B     Other   Flu-like symptoms   Symptom management discussed Advised to rest and stay well-hydrated Pain management discussed Advised to contact the office if no better or worse during the next several days.      Relevant Medications   HYDROcodone -acetaminophen  (NORCO/VICODIN) 5-325 MG tablet   Other Relevant Orders   POC COVID-19   POCT Influenza A/B   Patient Instructions  Viral Respiratory Infection A viral respiratory infection is an illness that affects parts of the body  that are used for breathing. These include the lungs, nose, and throat. It is caused by a germ called a virus. Some examples of this kind of infection are: A cold. The flu (influenza). A respiratory syncytial virus (RSV) infection. What are the causes? This condition is caused by a virus. It spreads from person to person. You can get the virus if: You breathe in droplets from someone who is sick. You come in contact with people who are sick. You touch mucus or other fluid from a person who is sick. What are the signs or symptoms? Symptoms of this condition include: A stuffy or runny nose. A sore throat. A cough. Shortness of breath. Trouble breathing. Yellow or green fluid in the  nose. Other symptoms may include: A fever. Sweating or chills. Tiredness (fatigue). Achy muscles. A headache. How is this treated? This condition may be treated with: Medicines that treat viruses. Medicines that make it easy to breathe. Medicines that are sprayed into the nose. Acetaminophen  or NSAIDs, such as ibuprofen , to treat fever. Follow these instructions at home: Managing pain and congestion Take over-the-counter and prescription medicines only as told by your doctor. If you have a sore throat, gargle with salt water . Do this 3-4 times a day or as needed. To make salt water , dissolve -1 tsp (3-6 g) of salt in 1 cup (237 mL) of warm water . Make sure that all the salt dissolves. Use nose drops made from salt water . This helps with stuffiness (congestion). It also helps soften the skin around your nose. Take 2 tsp (10 mL) of honey at bedtime to lessen coughing at night. Do not give honey to children who are younger than 42 year old. Drink enough fluid to keep your pee (urine) pale yellow. General instructions  Rest as much as possible. Do not drink alcohol. Do not smoke or use any products that contain nicotine or tobacco. If you need help quitting, ask your doctor. Keep all follow-up visits. How is this prevented?     Get a flu shot every year. Ask your doctor when you should get your flu shot. Do not let other people get your germs. If you are sick: Wash your hands with soap and water  often. Wash your hands after you cough or sneeze. Wash hands for at least 20 seconds. If you cannot use soap and water , use hand sanitizer. Cover your mouth when you cough. Cover your nose and mouth when you sneeze. Do not share cups or eating utensils. Clean commonly used objects often. Clean commonly touched surfaces. Stay home from work or school. Avoid contact with people who are sick during cold and flu season. This is in fall and winter. Get help if: Your symptoms last for 10 days  or longer. Your symptoms get worse over time. You have very bad pain in your face or forehead. Parts of your jaw or neck get very swollen. You have shortness of breath. Get help right away if: You feel pain or pressure in your chest. You have trouble breathing. You faint or feel like you will faint. You keep vomiting and it gets worse. You feel confused. These symptoms may be an emergency. Get help right away. Call your local emergency services (911 in the [place] Do not wait to see if the symptoms will go away. Do not drive yourself to the hospital. Summary A viral respiratory infection is an illness that affects parts of the body that are used for breathing. Examples of this illness include a  cold, the flu, and a respiratory syncytial virus (RSV) infection. The infection can cause a runny nose, cough, sore throat, and fever. Follow what your doctor tells you about taking medicines, drinking lots of fluid, washing your hands, resting at home, and avoiding people who are sick. This information is not intended to replace advice given to you by your health care provider. Make sure you discuss any questions you have with your health care provider. Document Revised: 11/12/2020 Document Reviewed: 11/12/2020 Elsevier Patient Education  2024 Elsevier Inc.    Emil Schaumann, MD [facility]

## 2024-05-17 DIAGNOSIS — R519 Headache, unspecified: Secondary | ICD-10-CM | POA: Diagnosis not present

## 2024-05-17 DIAGNOSIS — R930 Abnormal findings on diagnostic imaging of skull and head, not elsewhere classified: Secondary | ICD-10-CM | POA: Diagnosis not present

## 2024-05-17 DIAGNOSIS — E7841 Elevated Lipoprotein(a): Secondary | ICD-10-CM | POA: Diagnosis not present

## 2024-05-17 DIAGNOSIS — G459 Transient cerebral ischemic attack, unspecified: Secondary | ICD-10-CM | POA: Diagnosis not present

## 2024-05-20 ENCOUNTER — Telehealth: Payer: Self-pay | Admitting: Internal Medicine

## 2024-05-20 NOTE — Telephone Encounter (Signed)
 Patient dropped off document Handicap Placard, to be filled out by provider. Patient requested to send it back via Call Patient to pick up within 7-days. Document is located in providers tray at front office.Please advise at Mobile [phone] (mobile)

## 2024-05-21 NOTE — Telephone Encounter (Signed)
Placed in blue folder for signature.

## 2024-05-22 DIAGNOSIS — J069 Acute upper respiratory infection, unspecified: Secondary | ICD-10-CM | POA: Diagnosis not present

## 2024-05-22 NOTE — Telephone Encounter (Signed)
Spoke with patient today.  Form left up front for pick up

## 2024-05-28 ENCOUNTER — Other Ambulatory Visit: Payer: Self-pay | Admitting: Emergency Medicine

## 2024-05-28 NOTE — Telephone Encounter (Signed)
 Copied from CRM [id number]. Topic: Clinical - Medication Refill >> May 28, 2024  8:23 AM Essie A wrote: Medication: promethazine -dextromethorphan (PROMETHAZINE -DM) 6.25-15 MG/5ML syrup  Has the patient contacted their pharmacy? No.  Doctor told her to call the office for refills (Agent: If no, request that the patient contact the pharmacy for the refill. If patient does not wish to contact the pharmacy document the reason why and proceed with request.) (Agent: If yes, when and what did the pharmacy advise?)  This is the patient's preferred pharmacy:  [facility] [id number] - [place], [place] - [address] [place] [address] Phone: [phone] Fax: [phone]  Is this the correct pharmacy for this prescription? Yes If no, delete pharmacy and type the correct one.   Has the prescription been filled recently? Yes  Is the patient out of the medication? No, had 3 more days left  Has the patient been seen for an appointment in the last year OR does the patient have an upcoming appointment? Yes  Can we respond through MyChart? Yes  Agent: Please be advised that Rx refills may take up to 3 business days. We ask that you follow-up with your pharmacy.

## 2024-06-02 ENCOUNTER — Emergency Department (HOSPITAL_COMMUNITY)

## 2024-06-02 ENCOUNTER — Encounter (HOSPITAL_COMMUNITY): Payer: Self-pay

## 2024-06-02 ENCOUNTER — Other Ambulatory Visit: Payer: Self-pay

## 2024-06-02 ENCOUNTER — Emergency Department (HOSPITAL_COMMUNITY)
Admission: EM | Admit: 2024-06-02 | Discharge: 2024-06-02 | Disposition: A | Discharge status: Home / Self Care | Attending: Emergency Medicine | Admitting: Emergency Medicine

## 2024-06-02 DIAGNOSIS — R519 Headache, unspecified: Secondary | ICD-10-CM | POA: Insufficient documentation

## 2024-06-02 DIAGNOSIS — W19XXXA Unspecified fall, initial encounter: Secondary | ICD-10-CM | POA: Diagnosis not present

## 2024-06-02 DIAGNOSIS — I499 Cardiac arrhythmia, unspecified: Secondary | ICD-10-CM | POA: Diagnosis not present

## 2024-06-02 DIAGNOSIS — Z043 Encounter for examination and observation following other accident: Secondary | ICD-10-CM | POA: Diagnosis not present

## 2024-06-02 DIAGNOSIS — R55 Syncope and collapse: Secondary | ICD-10-CM | POA: Diagnosis not present

## 2024-06-02 DIAGNOSIS — S199XXA Unspecified injury of neck, initial encounter: Secondary | ICD-10-CM | POA: Diagnosis not present

## 2024-06-02 DIAGNOSIS — S0990XA Unspecified injury of head, initial encounter: Secondary | ICD-10-CM | POA: Diagnosis not present

## 2024-06-02 DIAGNOSIS — S0083XA Contusion of other part of head, initial encounter: Secondary | ICD-10-CM | POA: Diagnosis not present

## 2024-06-02 DIAGNOSIS — S80911A Unspecified superficial injury of right knee, initial encounter: Secondary | ICD-10-CM | POA: Diagnosis not present

## 2024-06-02 DIAGNOSIS — Z743 Need for continuous supervision: Secondary | ICD-10-CM | POA: Diagnosis not present

## 2024-06-02 DIAGNOSIS — S0993XA Unspecified injury of face, initial encounter: Secondary | ICD-10-CM | POA: Diagnosis not present

## 2024-06-02 LAB — COMPREHENSIVE METABOLIC PANEL WITH GFR
ALT: 17 U/L (ref 0–44)
AST: 23 U/L (ref 15–41)
Albumin: 4 g/dL (ref 3.5–5.0)
Alkaline Phosphatase: 100 U/L (ref 38–126)
Anion gap: 10 (ref 5–15)
BUN: 13 mg/dL (ref 6–20)
CO2: 30 mmol/L (ref 22–32)
Calcium: 9.5 mg/dL (ref 8.9–10.3)
Chloride: 99 mmol/L (ref 98–111)
Creatinine, Ser: 0.76 mg/dL (ref 0.44–1.00)
GFR, Estimated: >60 mL/min (ref >60)
Glucose, Bld: 119 mg/dL — ABNORMAL HIGH (ref 70–99)
Potassium: 4.1 mmol/L (ref 3.5–5.1)
Sodium: 139 mmol/L (ref 135–145)
Total Bilirubin: 1.4 mg/dL — ABNORMAL HIGH (ref 0.0–1.2)
Total Protein: 7.5 g/dL (ref 6.5–8.1)

## 2024-06-02 LAB — CBC
HCT: 40.1 % (ref 36.0–46.0)
Hemoglobin: 14.5 g/dL (ref 12.0–15.0)
MCH: 32.3 pg (ref 26.0–34.0)
MCHC: 36.2 g/dL — ABNORMAL HIGH (ref 30.0–36.0)
MCV: 89.3 fL (ref 80.0–100.0)
Platelets: 247 K/uL (ref 150–400)
RBC: 4.49 MIL/uL (ref 3.87–5.11)
RDW: 13.2 % (ref 11.5–15.5)
WBC: 8.7 K/uL (ref 4.0–10.5)
nRBC: 0 % (ref 0.0–0.2)

## 2024-06-02 LAB — URINALYSIS, ROUTINE W REFLEX MICROSCOPIC
Bilirubin Urine: NEGATIVE
Glucose, UA: NEGATIVE mg/dL
Hgb urine dipstick: NEGATIVE
Ketones, ur: NEGATIVE mg/dL
Leukocytes,Ua: NEGATIVE
Nitrite: NEGATIVE
Protein, ur: NEGATIVE mg/dL
Specific Gravity, Urine: 1.02 (ref 1.005–1.030)
pH: 7 (ref 5.0–8.0)

## 2024-06-02 LAB — CBG MONITORING, ED: Glucose-Capillary: 127 mg/dL — ABNORMAL HIGH (ref 70–99)

## 2024-06-02 LAB — TROPONIN I (HIGH SENSITIVITY): Troponin I (High Sensitivity): 3 ng/L (ref <18)

## 2024-06-02 LAB — CK: Total CK: 117 U/L (ref 38–234)

## 2024-06-02 MED ORDER — ACETAMINOPHEN 325 MG PO TABS
650.0000 mg | ORAL_TABLET | Freq: Once | ORAL | Status: AC
  Administered 2024-06-02: 650 mg via ORAL
  Filled 2024-06-02: qty 2

## 2024-06-02 NOTE — Discharge Instructions (Signed)
 Your MRI did not show any acute findings, you were provided with a copy to follow up with your neurologist.   If you experience any worsening symptoms, please return to the ED.

## 2024-06-02 NOTE — ED Triage Notes (Signed)
 Pt stepped off curb and passed out. Pt woke up on ground with chipped tooth. C/O right leg pain. Hx of TIA's and MS.VSS.

## 2024-06-02 NOTE — ED Provider Notes (Signed)
 [place] [facility] AT [facility] Provider Note   CSN: [id number] Arrival date & time: 06/02/24  1304     Patient presents with: Loss of Consciousness   Kathleen Solis is a 58 y.o. female.   58 y.o female with a PMH of HTN, presents to the ED status post syncope versus mechanical fall.  Patient reports that she was walking to her car to get [employer] around 1030 this morning, when suddenly she found herself on the ground.  She does report she lost consciousness, she is unsure where she passed out or tripped and fell.  She reports that she was pushing against the median as her son had gone to throw away the garbage and came back to find her lying on the floor forward.  She does have a small abrasion to the right knee.  She also has some bruising noted to the right forearm.  He is complaining of a headache which has been ongoing for several months.  She tells me that she was previously evaluated by neurology, they were concerned as she does have a prior history of mini strokes, they did 1 ordered an MRI however they have not been able to do so.  She states that the headache is mostly sharp and stabbing across the entire forehead throbs and the top.  No alleviating factors.  No exacerbating factors.  She is not anticoagulated.  Continues to endorse pain throughout her body along with her head.  She did not have any prodromal symptoms prior to her syncope. No chest pain, no shortness of breath or other complaints.  The history is provided by the patient.  Loss of Consciousness Episode history:  Single Most recent episode:  Today Associated symptoms: headaches   Associated symptoms: no chest pain, no dizziness, no fever, no shortness of breath and no vomiting        Prior to Admission medications   Medication Sig Start Date End Date Taking? Authorizing Provider  acetaminophen  (TYLENOL ) 500 MG tablet Take 500 mg by mouth every 6 (six) hours as needed.    [provider]  atorvastatin  (LIPITOR) 20 MG tablet Take 1 tablet (20 mg total) by mouth daily. 12/28/22   Geofm Glade PARAS, MD  fluticasone  (FLONASE ) 50 MCG/ACT nasal spray Place 2 sprays into both nostrils daily. 12/28/23   Shelah Lamar RAMAN, MD  hydrochlorothiazide  (HYDRODIURIL ) 25 MG tablet TAKE 1 TABLET BY MOUTH DAILY 01/22/24   Geofm Glade PARAS, MD  HYDROcodone -acetaminophen  (NORCO/VICODIN) 5-325 MG tablet Take 1 tablet by mouth every 6 (six) hours as needed for moderate pain (pain score 4-6). 05/16/24   Purcell Emil Schanz, MD  metoprolol  succinate (TOPROL -XL) 100 MG 24 hr tablet TAKE 1 TABLET BY MOUTH AT BEDTIME 04/23/24   Geofm Glade PARAS, MD  promethazine -dextromethorphan (PROMETHAZINE -DM) 6.25-15 MG/5ML syrup Take 5 mLs by mouth 4 (four) times daily as needed for cough. 04/16/24   Shelah Lamar RAMAN, MD  vitamin B-12 (CYANOCOBALAMIN ) 50 MCG tablet Take 50 mcg by mouth daily.    [provider]    Allergies: Benzonatate , Darvon [propoxyphene], Ibuprofen , Morphine and codeine , Prednisone , Sulfa antibiotics, Tussionex pennkinetic er [hydrocod poli-chlorphe poli er], Buprenorphine hcl, Ivp dye [iodinated contrast media], and Prochlorperazine edisylate    Review of Systems  Constitutional:  Negative for chills and fever.  Respiratory:  Negative for shortness of breath.   Cardiovascular:  Positive for syncope. Negative for chest pain.  Gastrointestinal:  Negative for abdominal pain and vomiting.  Genitourinary:  Negative for flank pain.  Musculoskeletal:  Negative for back pain.  Neurological:  Positive for headaches. Negative for dizziness.  All other systems reviewed and are negative.   Updated Vital Signs BP 109/84   Pulse 62   Temp 98.2 F (36.8 C) (Oral)   Resp 11   Ht 5' 5 (1.651 m)   Wt 85.3 kg   SpO2 100%   BMI 31.28 kg/m   Physical Exam Vitals and nursing note reviewed.  Constitutional:      Appearance: Normal appearance.  HENT:     Head: Normocephalic.     Comments:  Bruising to the right cheek.    Mouth/Throat:     Mouth: Mucous membranes are moist.  Eyes:     Pupils: Pupils are equal, round, and reactive to light.     Comments: Pupils are equal and reactive.   Cardiovascular:     Rate and Rhythm: Normal rate.  Pulmonary:     Effort: Pulmonary effort is normal.     Breath sounds: No wheezing.     Comments: Lungs are clear.  Abdominal:     General: Abdomen is flat.     Palpations: Abdomen is soft.     Tenderness: There is no abdominal tenderness.  Musculoskeletal:     Cervical back: Normal range of motion and neck supple.  Skin:    General: Skin is warm and dry.  Neurological:     Mental Status: She is alert and oriented to person, place, and time.     Comments: Alert, oriented, thought content appropriate. Speech fluent without evidence of aphasia. Able to follow 2 step commands without difficulty.  Cranial Nerves:  II:  Peripheral visual fields grossly normal, pupils, round, reactive to light III,IV, VI: ptosis not present, extra-ocular motions intact bilaterally  V,VII: smile symmetric, facial light touch sensation equal VIII: hearing grossly normal bilaterally  IX,X: midline uvula rise  XI: bilateral shoulder shrug equal and strong XII: midline tongue extension  Motor:  5/5 in upper and lower extremities bilaterally including strong and equal grip strength and dorsiflexion/plantar flexion Sensory: light touch normal in all extremities.  Cerebellar: normal finger-to-nose with bilateral upper extremities, pronator drift negative Gait: N/A       (all labs ordered are listed, but only abnormal results are displayed) Labs Reviewed  COMPREHENSIVE METABOLIC PANEL WITH GFR - Abnormal; Notable for the following components:      Result Value   Glucose, Bld 119 (*)    Total Bilirubin 1.4 (*)    All other components within normal limits  CBC - Abnormal; Notable for the following components:   MCHC 36.2 (*)    All other components within  normal limits  CBG MONITORING, ED - Abnormal; Notable for the following components:   Glucose-Capillary 127 (*)    All other components within normal limits  URINALYSIS, ROUTINE W REFLEX MICROSCOPIC  CK  TROPONIN I (HIGH SENSITIVITY)    EKG: EKG Interpretation Date/Time:  Sunday June 02 2024 13:11:30 EDT Ventricular Rate:  56 PR Interval:  178 QRS Duration:  86 QT Interval:  424 QTC Calculation: 409 R Axis:   55  Text Interpretation: Sinus bradycardia Nonspecific T wave abnormality Abnormal ECG When compared with ECG of 01-Jan-2024 09:43, PREVIOUS ECG IS PRESENT No significant change since last tracing Confirmed by Zackowski, Scott [phone]) on 06/02/2024 2:05:07 PM  Radiology: MR BRAIN WO CONTRAST Result Date: 06/02/2024 EXAM: MR Brain without Intravenous Contrast. CLINICAL HISTORY: Syncope/presyncope, cerebrovascular cause suspected. TECHNIQUE: Magnetic resonance  images of the brain without intravenous contrast in multiple planes. CONTRAST: Without. COMPARISON: MR of the head without contrast 02/10/2023 and CT head without contrast 06/02/2024. FINDINGS: BRAIN: No restricted diffusion to indicate acute infarction. No intracranial mass or hemorrhage. No midline shift or extra-axial fluid collection. No cerebellar tonsillar ectopia. The central arterial and venous flow voids are patent. Scattered subcortical T2 hyperintensities, predominantly on the left, are similar to the prior exam. VENTRICLES: No hydrocephalus. ORBITS: The orbits are normal. SINUSES AND MASTOIDS: The sinuses and mastoid air cells are clear. BONES: No acute fracture or focal osseous lesion. IMPRESSION: 1. No acute intracranial abnormality. 2. Scattered subcortical T2 hyperintensities, predominantly on the left, unchanged from prior. The finding is nonspecific but can be seen in the setting of chronic microvascular ischemia, a demyelinating process such as multiple sclerosis, vasculitis, complicated migraine headaches, or  as the sequelae of a prior infectious or inflammatory process. Electronically signed by: Lonni Necessary MD 06/02/2024 05:50 PM EDT RP Workstation: [id number]   DG Knee Complete 4 Views Right Result Date: 06/02/2024 CLINICAL DATA:  Status post fall. EXAM: RIGHT KNEE - COMPLETE 4+ VIEW COMPARISON:  None Available. FINDINGS: No evidence of fracture, dislocation, or joint effusion. No evidence of arthropathy or other focal bone abnormality. There is mild vascular calcification. Soft tissues are otherwise unremarkable. IMPRESSION: No acute osseous abnormality. Electronically Signed   By: Suzen Dials M.D.   On: 06/02/2024 14:56   CT Head Wo Contrast Result Date: 06/02/2024 EXAM: CT HEAD, FACIAL BONES AND CERVICAL SPINE WITHOUT CONTRAST 06/02/2024 02:31:14 PM TECHNIQUE: CT of the head, facial bones and cervical spine was performed without the administration of intravenous contrast. Multiplanar reformatted images are provided for review. Automated exposure control, iterative reconstruction, and/or weight based adjustment of the mA/kV was utilized to reduce the radiation dose to as low as reasonably achievable. COMPARISON: None available. CLINICAL HISTORY: Head trauma, moderate-severe; Polytrauma, blunt. fall FINDINGS: CT HEAD BRAIN AND VENTRICLES: No acute intracranial hemorrhage. No mass effect or midline shift. No extra-axial fluid collection. No evidence of acute infarct. No hydrocephalus. SKULL AND SCALP: No acute skull fracture. No scalp hematoma. CT FACIAL BONES FACIAL BONES: No acute facial fracture. No mandibular dislocation. No suspicious bone lesion. ORBITS: No acute traumatic injury. SINUSES AND MASTOIDS: No acute abnormality. SOFT TISSUES: No acute abnormality. CT CERVICAL SPINE BONES AND ALIGNMENT: No acute fracture or traumatic malalignment. DEGENERATIVE CHANGES: No significant degenerative changes. SOFT TISSUES: No prevertebral soft tissue swelling. IMPRESSION: 1. No acute intracranial  abnormality. 2. No acute fracture or traumatic malalignment of the cervical spine. 3. No acute fracture of the facial bones. Electronically signed by: Gilmore Molt MD 06/02/2024 02:42 PM EDT RP Workstation: [id number]   CT Cervical Spine Wo Contrast Result Date: 06/02/2024 EXAM: CT HEAD, FACIAL BONES AND CERVICAL SPINE WITHOUT CONTRAST 06/02/2024 02:31:14 PM TECHNIQUE: CT of the head, facial bones and cervical spine was performed without the administration of intravenous contrast. Multiplanar reformatted images are provided for review. Automated exposure control, iterative reconstruction, and/or weight based adjustment of the mA/kV was utilized to reduce the radiation dose to as low as reasonably achievable. COMPARISON: None available. CLINICAL HISTORY: Head trauma, moderate-severe; Polytrauma, blunt. fall FINDINGS: CT HEAD BRAIN AND VENTRICLES: No acute intracranial hemorrhage. No mass effect or midline shift. No extra-axial fluid collection. No evidence of acute infarct. No hydrocephalus. SKULL AND SCALP: No acute skull fracture. No scalp hematoma. CT FACIAL BONES FACIAL BONES: No acute facial fracture. No mandibular dislocation. No suspicious bone lesion. ORBITS: No  acute traumatic injury. SINUSES AND MASTOIDS: No acute abnormality. SOFT TISSUES: No acute abnormality. CT CERVICAL SPINE BONES AND ALIGNMENT: No acute fracture or traumatic malalignment. DEGENERATIVE CHANGES: No significant degenerative changes. SOFT TISSUES: No prevertebral soft tissue swelling. IMPRESSION: 1. No acute intracranial abnormality. 2. No acute fracture or traumatic malalignment of the cervical spine. 3. No acute fracture of the facial bones. Electronically signed by: Gilmore Molt MD 06/02/2024 02:42 PM EDT RP Workstation: [id number]   CT Maxillofacial WO CM Result Date: 06/02/2024 EXAM: CT HEAD, FACIAL BONES AND CERVICAL SPINE WITHOUT CONTRAST 06/02/2024 02:31:14 PM TECHNIQUE: CT of the head, facial bones and cervical  spine was performed without the administration of intravenous contrast. Multiplanar reformatted images are provided for review. Automated exposure control, iterative reconstruction, and/or weight based adjustment of the mA/kV was utilized to reduce the radiation dose to as low as reasonably achievable. COMPARISON: None available. CLINICAL HISTORY: Head trauma, moderate-severe; Polytrauma, blunt. fall FINDINGS: CT HEAD BRAIN AND VENTRICLES: No acute intracranial hemorrhage. No mass effect or midline shift. No extra-axial fluid collection. No evidence of acute infarct. No hydrocephalus. SKULL AND SCALP: No acute skull fracture. No scalp hematoma. CT FACIAL BONES FACIAL BONES: No acute facial fracture. No mandibular dislocation. No suspicious bone lesion. ORBITS: No acute traumatic injury. SINUSES AND MASTOIDS: No acute abnormality. SOFT TISSUES: No acute abnormality. CT CERVICAL SPINE BONES AND ALIGNMENT: No acute fracture or traumatic malalignment. DEGENERATIVE CHANGES: No significant degenerative changes. SOFT TISSUES: No prevertebral soft tissue swelling. IMPRESSION: 1. No acute intracranial abnormality. 2. No acute fracture or traumatic malalignment of the cervical spine. 3. No acute fracture of the facial bones. Electronically signed by: Gilmore Molt MD 06/02/2024 02:42 PM EDT RP Workstation: [id number]     Procedures   Medications Ordered in the ED  acetaminophen  (TYLENOL ) tablet 650 mg (650 mg Oral Given 06/02/24 1742)                                    Medical Decision Making Amount and/or Complexity of Data Reviewed Labs: ordered. Radiology: ordered.  Risk OTC drugs.   This patient presents to the ED for concern of fall, this involves a number of treatment options, and is a complaint that carries with it a high risk of complications and morbidity.  The differential diagnosis includes syncope, fall versus CVA.    Co morbidities: Discussed in HPI   Brief History:  See HPI.    EMR reviewed including pt PMHx, past surgical history and past visits to ER.   See HPI for more details   Lab Tests:  I ordered and independently interpreted labs.  The pertinent results include:    I personally reviewed all laboratory work and imaging. Metabolic panel without any acute abnormality specifically kidney function within normal limits and no significant electrolyte abnormalities. CBC without leukocytosis or significant anemia.  Imaging Studies:  CT Head/cervical spine/maxillofacial showed: IMPRESSION:  1. No acute intracranial abnormality.  2. No acute fracture or traumatic malalignment of the cervical spine.  3. No acute fracture of the facial bones.   Cardiac Monitoring:  The patient was maintained on a cardiac monitor.  I personally viewed and interpreted the cardiac monitored which showed an underlying rhythm of: Sinus bradycardia EKG non-ischemic   Medicines ordered:  I ordered medication including tylenol   for headache Reevaluation of the patient after these medicines showed that the patient improved I have reviewed the patients home medicines  and have made adjustments as needed  Reevaluation:  After the interventions noted above I re-evaluated patient and found that they have :improved  Social Determinants of Health:  The patient's social determinants of health were a factor in the care of this patient  Problem List / ED Course:  Patient presented to the ED with a chief complaint of syncope versus fall.  Episode was not witnessed, therefore unable to obtain collateral information.  She does report her son found her on the ground, she was pushing against the median, there was no note of any seizure-like activity.  On evaluation patient is complaining of a headache, reports she has had these for some time and was previously followed by neurology who wanted to obtain an MRI.  She did lose consciousness during this fall, she is not anticoagulated.  There  is some bruising noted to the left side of her face along with a scrape to the right knee.  Her last tetanus immunization is unknown, however she is refusing immunization on today's visit. She is neurologically intact, there was no prodromal episode, therefore further workup was expanded from triage orders that were placed.  Blood work here showed a CBC with no leukocytosis, hemoglobin stable.  CMP without any electrolyte derangement to account for this.  Creatinine levels unremarkable.  LFTs are within normal limits.  Troponin was added which was negative, she is not having any chest pain or shortness of breath at this time. Further concern for seizure, therefore CK was evaluated which is normal.  UA without any infectious signs to account for any infection.  CT of her head, cervical spine, maxillofacial was obtained which did not show any acute findings.  X-ray of the right knee was also obtained without any fracture or dislocation. Low concern for ACS, no chest pain, no sob, NSR EKG and negative troponin.  Further workup was obtained with MRI of her brain which did not show any acute findings.  She does have a neurology who she currently follows with, there is some concern of migraines.  I did discuss close follow-up with them.   Dispostion:  After consideration of the diagnostic results and the patients response to treatment, I feel that the patent would benefit from close follow neurology.     Portions of this note were generated with Scientist, clinical (histocompatibility and immunogenetics). Dictation errors may occur despite best attempts at proofreading.   Final diagnoses:  Syncope, unspecified syncope type  Bad headache    ED Discharge Orders     None          Maureen Broad, PA-C 06/02/24 1832    Rogelia Jerilynn RAMAN, MD 06/06/24 [id number]

## 2024-06-02 NOTE — ED Provider Triage Note (Signed)
 Emergency Medicine Provider Triage Evaluation Note  Kathleen Solis , a 58 y.o. female  was evaluated in triage.  Pt complains of fall or possible syncope.  Patient with a complaint of head pain has had headaches for a while.  Neck pain face pain mostly on the right side.  And right knee pain.  Patient is not sure she was going down the steps outside texting she knew she was on the ground.  But she cannot remember the actual fall but it does appear that she had her hands out because she has got some abrasions on her hands.  Do not think this was full syncope.  Patient brought in by EMS.  Patient was able to be assisted up by her son and was able to get back into the house.  Past medical history significant for patient being followed by [facility] neurology.  With some MRIs pending.  Has had MRIs in the past but not recently.  They were planning MRIs due to these persistent headaches.  Patient apparently had TIAs in the past.  But no visual speech or any numbness or weakness currently.  Review of Systems  Positive: As above Negative: As above  Physical Exam  BP 131/78 (BP Location: Left Arm)   Pulse (!) 58   Temp 98.2 F (36.8 C) (Oral)   Resp 18   Ht 1.651 m (5' 5)   Wt 85.3 kg   SpO2 99%   BMI 31.28 kg/m  Gen: Awake, no distress   Head neck: Right side of face with some bruising and tenderness.  A mark on her right upper incisor.  But is not chipped. Resp: Normal effort  Heart: Regular rate and rhythm. MSK: An abrasion to the right knee with some swelling.  Distally neurovascularly intact.  Some abrasions to the palms of her hands but they are not deep no tenderness. Neuro: No neurodeficits.  Medical Decision Making  Medically screening exam initiated at 1:51 PM.  Appropriate orders placed.  Kathleen Solis was informed that the remainder of the evaluation will be completed by another provider, this initial triage assessment does not replace that evaluation, and the importance of remaining  in the ED until their evaluation is complete.  Patient will need CT head CT neck CT face.  Will need x-ray of the right knee.  Clinically I do not think that she had a syncopal episodes I think she actually stumbled and fell based on the fact that she braced herself with her hands.  Will get basic labs EKG without any arrhythmias or acute findings.  Patient refuses tetanus.  Says she did have some as a child.  But does not want anymore.  So refuses tetanus update.   Tyera Hansley, MD 06/02/24 1409

## 2024-06-03 ENCOUNTER — Other Ambulatory Visit: Payer: Self-pay | Admitting: Emergency Medicine

## 2024-06-03 NOTE — Telephone Encounter (Signed)
 Copied from CRM [id number]. Topic: Clinical - Medication Refill >> May 28, 2024  8:23 AM Essie A wrote: Medication: promethazine -dextromethorphan (PROMETHAZINE -DM) 6.25-15 MG/5ML syrup  Has the patient contacted their pharmacy? No.  Doctor told her to call the office for refills (Agent: If no, request that the patient contact the pharmacy for the refill. If patient does not wish to contact the pharmacy document the reason why and proceed with request.) (Agent: If yes, when and what did the pharmacy advise?)  This is the patient's preferred pharmacy:  [facility] [id number] - [place], [place] - [address] [place] [address] Phone: [phone] Fax: [phone]  Is this the correct pharmacy for this prescription? Yes If no, delete pharmacy and type the correct one.   Has the prescription been filled recently? Yes  Is the patient out of the medication? No, had 3 more days left  Has the patient been seen for an appointment in the last year OR does the patient have an upcoming appointment? Yes  Can we respond through MyChart? Yes  Agent: Please be advised that Rx refills may take up to 3 business days. We ask that you follow-up with your pharmacy. >> Jun 03, 2024  8:17 AM Ismael A wrote: Patient called to follow up on refill request, states she needs  it as soon as possible, she is out of the medication

## 2024-06-04 ENCOUNTER — Telehealth: Payer: Self-pay

## 2024-06-04 MED ORDER — PROMETHAZINE-DM 6.25-15 MG/5ML PO SYRP: 5.0000 mL | ORAL_SOLUTION | Freq: Four times a day (QID) | ORAL | 0 refills | Status: DC | PRN

## 2024-06-04 NOTE — Telephone Encounter (Signed)
>>   Jun 04, 2024 10:40 AM Leila BROCKS wrote: Patient [phone] states called last week and yesterday for for refill promethazine -dextromethorphan (PROMETHAZINE -DM) 6.25-15 MG/5ML syrup. Patient is completely out of medication. Patient asked if another provider can refill, if not patient asked for communication status on this request. Please advise and call back.   [facility] [id number] [place], [place] - [address] [place] [address] Phone: [phone] Fax: [phone] >> Jun 03, 2024  8:17 AM Ismael A wrote: Patient called to follow up on refill request, states she needs  it as soon as possible, she is out of the medication    Copied from CRM [id number]. Topic: Clinical - Medication Refill >> May 28, 2024  8:23 AM Essie A wrote: Medication: promethazine -dextromethorphan (PROMETHAZINE -DM) 6.25-15 MG/5ML syrup  Has the patient contacted their pharmacy? No.  Doctor told her to call the office for refills (Agent: If no, request that the patient contact the pharmacy for the refill. If patient does not wish to contact the pharmacy document the reason why and proceed with request.) (Agent: If yes, when and what did the pharmacy advise?)  This is the patient's preferred pharmacy:  [facility] [id number] - [place], [place] - [address] [place] [address] Phone: [phone] Fax: [phone]  Is this the correct pharmacy for this prescription? Yes If no, delete pharmacy and type the correct one.   Has the prescription been filled recently? Yes  Is the patient out of the medication? No, had 3 more days left  Has the patient been seen for an appointment in the last year OR does the patient have an upcoming appointment? Yes  Can we respond through MyChart? Yes  Agent: Please be advised that Rx refills may take up to 3 business days. We ask that you follow-up with your pharmacy.

## 2024-06-04 NOTE — Telephone Encounter (Signed)
 Spoke with patient and refill has been taken care of by Pulmonary.

## 2024-06-10 ENCOUNTER — Ambulatory Visit (INDEPENDENT_AMBULATORY_CARE_PROVIDER_SITE_OTHER): Payer: 59

## 2024-06-10 VITALS — BP 120/72 | HR 60 | Ht 65.0 in | Wt 190.4 lb

## 2024-06-10 DIAGNOSIS — R55 Syncope and collapse: Secondary | ICD-10-CM | POA: Insufficient documentation

## 2024-06-10 DIAGNOSIS — Z0000 Encounter for general adult medical examination without abnormal findings: Secondary | ICD-10-CM | POA: Diagnosis not present

## 2024-06-10 NOTE — Patient Instructions (Addendum)
 Ms. Egelston,  Thank you for taking the time for your Medicare Wellness Visit. I appreciate your continued commitment to your health goals. Please review the care plan we discussed, and feel free to reach out if I can assist you further.  Medicare recommends these wellness visits once per year to help you and your care team stay ahead of potential health issues. These visits are designed to focus on prevention, allowing your provider to concentrate on managing your acute and chronic conditions during your regular appointments.  Please note that Annual Wellness Visits do not include a physical exam. Some assessments may be limited, especially if the visit was conducted virtually. If needed, we may recommend a separate in-person follow-up with your provider.  Ongoing Care Seeing your primary care provider every 3 to 6 months helps us  monitor your health and provide consistent, personalized care. Managing Pain Without Opioids Opioids are strong medicines used to treat moderate to severe pain. For some people, especially those who have long-term (chronic) pain, opioids may not be the best choice for pain management due to: Side effects like nausea, constipation, and sleepiness. The risk of addiction (opioid use disorder). The longer you take opioids, the greater your risk of addiction. Pain that lasts for more than 3 months is called chronic pain. Managing chronic pain usually requires more than one approach and is often provided by a team of health care providers working together (multidisciplinary approach). Pain management may be done at a pain management center or pain clinic. How to manage pain without the use of opioids Use non-opioid medicines Non-opioid medicines for pain may include: Over-the-counter or prescription non-steroidal anti-inflammatory drugs (NSAIDs). These may be the first medicines used for pain. They work well for muscle and bone pain, and they reduce swelling. Acetaminophen .  This over-the-counter medicine may work well for milder pain but not swelling. Antidepressants. These may be used to treat chronic pain. A certain type of antidepressant (tricyclics) is often used. These medicines are given in lower doses for pain than when used for depression. Anticonvulsants. These are usually used to treat seizures but may also reduce nerve (neuropathic) pain. Muscle relaxants. These relieve pain caused by sudden muscle tightening (spasms). You may also use a pain medicine that is applied to the skin as a patch, cream, or gel (topical analgesic), such as a numbing medicine. These may cause fewer side effects than medicines taken by mouth. Do certain therapies as directed Some therapies can help with pain management. They include: Physical therapy. You will do exercises to gain strength and flexibility. A physical therapist may teach you exercises to move and stretch parts of your body that are weak, stiff, or painful. You can learn these exercises at physical therapy visits and practice them at home. Physical therapy may also involve: Massage. Heat wraps or applying heat or cold to affected areas. Electrical signals that interrupt pain signals (transcutaneous electrical nerve stimulation, TENS). Weak lasers that reduce pain and swelling (low-level laser therapy). Signals from your body that help you learn to regulate pain (biofeedback). Occupational therapy. This helps you to learn ways to function at home and work with less pain. Recreational therapy. This involves trying new activities or hobbies, such as a physical activity or drawing. Mental health therapy, including: Cognitive behavioral therapy (CBT). This helps you learn coping skills for dealing with pain. Acceptance and commitment therapy (ACT) to change the way you think and react to pain. Relaxation therapies, including muscle relaxation exercises and mindfulness-based stress reduction. Pain  management counseling.  This may be individual, family, or group counseling.  Receive medical treatments Medical treatments for pain management include: Nerve block injections. These may include a pain blocker and anti-inflammatory medicines. You may have injections: Near the spine to relieve chronic back or neck pain. Into joints to relieve back or joint pain. Into nerve areas that supply a painful area to relieve body pain. Into muscles (trigger point injections) to relieve some painful muscle conditions. A medical device placed near your spine to help block pain signals and relieve nerve pain or chronic back pain (spinal cord stimulation device). Acupuncture. Follow these instructions at home Medicines Take over-the-counter and prescription medicines only as told by your health care provider. If you are taking pain medicine, ask your health care providers about possible side effects to watch out for. Do not drive or use heavy machinery while taking prescription opioid pain medicine. Lifestyle  Do not use drugs or alcohol to reduce pain. If you drink alcohol, limit how much you have to: 0-1 drink a day for women who are not pregnant. 0-2 drinks a day for men. Know how much alcohol is in a drink. In the [place] one drink equals one 12 oz bottle of beer (355 mL), one 5 oz glass of wine (148 mL), or one 1 oz glass of hard liquor (44 mL). Do not use any products that contain nicotine or tobacco. These products include cigarettes, chewing tobacco, and vaping devices, such as e-cigarettes. If you need help quitting, ask your health care provider. Eat a healthy diet and maintain a healthy weight. Poor diet and excess weight may make pain worse. Eat foods that are high in fiber. These include fresh fruits and vegetables, whole grains, and beans. Limit foods that are high in fat and processed sugars, such as fried and sweet foods. Exercise regularly. Exercise lowers stress and may help relieve pain. Ask your health care  provider what activities and exercises are safe for you. If your health care provider approves, join an exercise class that combines movement and stress reduction. Examples include yoga and tai chi. Get enough sleep. Lack of sleep may make pain worse. Lower stress as much as possible. Practice stress reduction techniques as told by your therapist. General instructions Work with all your pain management providers to find the treatments that work best for you. You are an important member of your pain management team. There are many things you can do to reduce pain on your own. Consider joining an online or in-person support group for people who have chronic pain. Keep all follow-up visits. This is important. Where to find more information You can find more information about managing pain without opioids from: American Academy of Pain Medicine: painmed.org Institute for Chronic Pain: instituteforchronicpain.org American Chronic Pain Association: theacpa.org Contact a health care provider if: You have side effects from pain medicine. Your pain gets worse or does not get better with treatments or home therapy. You are struggling with anxiety or depression. Summary Many types of pain can be managed without opioids. Chronic pain may respond better to pain management without opioids. Pain is best managed when you and a team of health care providers work together. Pain management without opioids may include non-opioid medicines, medical treatments, physical therapy, mental health therapy, and lifestyle changes. Tell your health care providers if your pain gets worse or is not being managed well enough. This information is not intended to replace advice given to you by your health care provider. Make  sure you discuss any questions you have with your health care provider. Document Revised: 11/18/2020 Document Reviewed: 11/18/2020 Elsevier Patient Education  2024 [employer]  Referrals If a  referral was made during today's visit and you haven't received any updates within two weeks, please contact the referred provider directly to check on the status.  Recommended Screenings:  Health Maintenance  Topic Date Due   Pneumococcal Vaccine for age over 25 (1 of 2 - PCV) Never done   Hepatitis B Vaccine (1 of 3 - 19+ 3-dose series) Never done   Zoster (Shingles) Vaccine (1 of 2) Never done   Flu Shot  Never done   COVID-19 Vaccine (3 - 2025-26 season) 04/22/2024   DTaP/Tdap/Td vaccine (1 - Tdap) 01/15/2025*   Breast Cancer Screening  09/07/2024   Medicare Annual Wellness Visit  06/10/2025   Colon Cancer Screening  02/08/2034   Hepatitis C Screening  Completed   HIV Screening  Completed   HPV Vaccine  Aged Out   Meningitis B Vaccine  Aged Out  *Topic was postponed. The date shown is not the original due date.       06/10/2024   10:54 AM  Advanced Directives  Does Patient Have a Medical Advance Directive? No  Would patient like information on creating a medical advance directive? No - Patient declined   Advance Care Planning is important because it: Ensures you receive medical care that aligns with your values, goals, and preferences. Provides guidance to your family and loved ones, reducing the emotional burden of decision-making during critical moments.  Vision: Annual vision screenings are recommended for early detection of glaucoma, cataracts, and diabetic retinopathy. These exams can also reveal signs of chronic conditions such as diabetes and high blood pressure.  Dental: Annual dental screenings help detect early signs of oral cancer, gum disease, and other conditions linked to overall health, including heart disease and diabetes.   Managing Pain Without Opioids Opioids are strong medicines used to treat moderate to severe pain. For some people, especially those who have long-term (chronic) pain, opioids may not be the best choice for pain management due to: Side  effects like nausea, constipation, and sleepiness. The risk of addiction (opioid use disorder). The longer you take opioids, the greater your risk of addiction. Pain that lasts for more than 3 months is called chronic pain. Managing chronic pain usually requires more than one approach and is often provided by a team of health care providers working together (multidisciplinary approach). Pain management may be done at a pain management center or pain clinic. How to manage pain without the use of opioids Use non-opioid medicines Non-opioid medicines for pain may include: Over-the-counter or prescription non-steroidal anti-inflammatory drugs (NSAIDs). These may be the first medicines used for pain. They work well for muscle and bone pain, and they reduce swelling. Acetaminophen . This over-the-counter medicine may work well for milder pain but not swelling. Antidepressants. These may be used to treat chronic pain. A certain type of antidepressant (tricyclics) is often used. These medicines are given in lower doses for pain than when used for depression. Anticonvulsants. These are usually used to treat seizures but may also reduce nerve (neuropathic) pain. Muscle relaxants. These relieve pain caused by sudden muscle tightening (spasms). You may also use a pain medicine that is applied to the skin as a patch, cream, or gel (topical analgesic), such as a numbing medicine. These may cause fewer side effects than medicines taken by mouth. Do certain therapies as directed  Some therapies can help with pain management. They include: Physical therapy. You will do exercises to gain strength and flexibility. A physical therapist may teach you exercises to move and stretch parts of your body that are weak, stiff, or painful. You can learn these exercises at physical therapy visits and practice them at home. Physical therapy may also involve: Massage. Heat wraps or applying heat or cold to affected areas. Electrical  signals that interrupt pain signals (transcutaneous electrical nerve stimulation, TENS). Weak lasers that reduce pain and swelling (low-level laser therapy). Signals from your body that help you learn to regulate pain (biofeedback). Occupational therapy. This helps you to learn ways to function at home and work with less pain. Recreational therapy. This involves trying new activities or hobbies, such as a physical activity or drawing. Mental health therapy, including: Cognitive behavioral therapy (CBT). This helps you learn coping skills for dealing with pain. Acceptance and commitment therapy (ACT) to change the way you think and react to pain. Relaxation therapies, including muscle relaxation exercises and mindfulness-based stress reduction. Pain management counseling. This may be individual, family, or group counseling.  Receive medical treatments Medical treatments for pain management include: Nerve block injections. These may include a pain blocker and anti-inflammatory medicines. You may have injections: Near the spine to relieve chronic back or neck pain. Into joints to relieve back or joint pain. Into nerve areas that supply a painful area to relieve body pain. Into muscles (trigger point injections) to relieve some painful muscle conditions. A medical device placed near your spine to help block pain signals and relieve nerve pain or chronic back pain (spinal cord stimulation device). Acupuncture. Follow these instructions at home Medicines Take over-the-counter and prescription medicines only as told by your health care provider. If you are taking pain medicine, ask your health care providers about possible side effects to watch out for. Do not drive or use heavy machinery while taking prescription opioid pain medicine. Lifestyle  Do not use drugs or alcohol to reduce pain. If you drink alcohol, limit how much you have to: 0-1 drink a day for women who are not pregnant. 0-2  drinks a day for men. Know how much alcohol is in a drink. In the [place] one drink equals one 12 oz bottle of beer (355 mL), one 5 oz glass of wine (148 mL), or one 1 oz glass of hard liquor (44 mL). Do not use any products that contain nicotine or tobacco. These products include cigarettes, chewing tobacco, and vaping devices, such as e-cigarettes. If you need help quitting, ask your health care provider. Eat a healthy diet and maintain a healthy weight. Poor diet and excess weight may make pain worse. Eat foods that are high in fiber. These include fresh fruits and vegetables, whole grains, and beans. Limit foods that are high in fat and processed sugars, such as fried and sweet foods. Exercise regularly. Exercise lowers stress and may help relieve pain. Ask your health care provider what activities and exercises are safe for you. If your health care provider approves, join an exercise class that combines movement and stress reduction. Examples include yoga and tai chi. Get enough sleep. Lack of sleep may make pain worse. Lower stress as much as possible. Practice stress reduction techniques as told by your therapist. General instructions Work with all your pain management providers to find the treatments that work best for you. You are an important member of your pain management team. There are many things you can  do to reduce pain on your own. Consider joining an online or in-person support group for people who have chronic pain. Keep all follow-up visits. This is important. Where to find more information You can find more information about managing pain without opioids from: American Academy of Pain Medicine: painmed.org Institute for Chronic Pain: instituteforchronicpain.org American Chronic Pain Association: theacpa.org Contact a health care provider if: You have side effects from pain medicine. Your pain gets worse or does not get better with treatments or home therapy. You are  struggling with anxiety or depression. Summary Many types of pain can be managed without opioids. Chronic pain may respond better to pain management without opioids. Pain is best managed when you and a team of health care providers work together. Pain management without opioids may include non-opioid medicines, medical treatments, physical therapy, mental health therapy, and lifestyle changes. Tell your health care providers if your pain gets worse or is not being managed well enough. This information is not intended to replace advice given to you by your health care provider. Make sure you discuss any questions you have with your health care provider. Document Revised: 11/18/2020 Document Reviewed: 11/18/2020 Elsevier Patient Education  2024 [employer].

## 2024-06-10 NOTE — Progress Notes (Signed)
 Subjective:   Kathleen Solis is a 58 y.o. who presents for a Medicare Wellness preventive visit.  As a reminder, Annual Wellness Visits don't include a physical exam, and some assessments may be limited, especially if this visit is performed virtually. We may recommend an in-person follow-up visit with your provider if needed.  Visit Complete: In person  Persons Participating in Visit: Patient.  AWV Questionnaire: No: Patient Medicare AWV questionnaire was not completed prior to this visit.  Cardiac Risk Factors include: advanced age (>49men, >95 women);dyslipidemia;hypertension;obesity (BMI >30kg/m2)     Objective:    Today's Vitals   06/10/24 1054  BP: 120/72  Pulse: 60  Weight: 190 lb 6.4 oz (86.4 kg)  Height: 5' 5 (1.651 m)  PainSc: 6    Body mass index is 31.68 kg/m.     06/10/2024   10:54 AM 02/03/2024    1:05 AM 06/08/2023   11:00 AM 03/07/2023    9:09 PM 02/10/2023    6:15 PM 01/23/2023    9:44 AM 05/12/2022   10:15 AM  Advanced Directives  Does Patient Have a Medical Advance Directive? No No No No No No No  Would patient like information on creating a medical advance directive? No - Patient declined  No - Patient declined No - Patient declined No - Patient declined  No - Patient declined    Current Medications (verified) Outpatient Encounter Medications as of 06/10/2024  Medication Sig   acetaminophen  (TYLENOL ) 500 MG tablet Take 500 mg by mouth every 6 (six) hours as needed.   atorvastatin  (LIPITOR) 20 MG tablet Take 1 tablet (20 mg total) by mouth daily.   fluticasone  (FLONASE ) 50 MCG/ACT nasal spray Place 2 sprays into both nostrils daily.   hydrochlorothiazide  (HYDRODIURIL ) 25 MG tablet TAKE 1 TABLET BY MOUTH DAILY   HYDROcodone -acetaminophen  (NORCO/VICODIN) 5-325 MG tablet Take 1 tablet by mouth every 6 (six) hours as needed for moderate pain (pain score 4-6).   metoprolol  succinate (TOPROL -XL) 100 MG 24 hr tablet TAKE 1 TABLET BY MOUTH AT BEDTIME    promethazine -dextromethorphan (PROMETHAZINE -DM) 6.25-15 MG/5ML syrup Take 5 mLs by mouth 4 (four) times daily as needed for cough.   vitamin B-12 (CYANOCOBALAMIN ) 50 MCG tablet Take 50 mcg by mouth daily.   No facility-administered encounter medications on file as of 06/10/2024.    Allergies (verified) Benzonatate , Darvon [propoxyphene], Ibuprofen , Morphine and codeine , Prednisone , Sulfa antibiotics, Tussionex pennkinetic er [hydrocod poli-chlorphe poli er], Buprenorphine hcl, Ivp dye [iodinated contrast media], and Prochlorperazine edisylate   History: Past Medical History:  Diagnosis Date   Allergic rhinitis 08/16/2016   Benign essential hypertension 03/03/2015   Bone spur of right foot 02/08/2016   Bunion of left foot 02/08/2016   Carotid arterial disease 04/10/2016   03/2016: right 40-59%, left < 40%; 10/2020 carotid US : No evidence of B ICA stenosis   Cerebrovascular accident (CVA)    MRI - Chronic hyperintensity left frontal parietal white matter unchanged likely due to chronic ischemia.   Chest pain 02/14/2017   Cyst of left ovary 03/03/2015   Diverticulitis of colon without hemorrhage 02/25/2016   Epigastric pain 05/06/2020   Episodic tension-type headache 01/12/2016   Taking motrin  200 mg bid   GERD (gastroesophageal reflux disease)    High-tone pelvic floor dysfunction 10/27/2021   History of COVID-19 05/18/2021   History of gout 06/23/2022   Hyperglycemia 02/14/2017   Chronic; encouraged low sugar/carbohydrate diet and regular exercise   Hyperlipidemia    diet controlled - no med  Left upper quadrant pain    Leg neuralgia, right    Related to CVA    Lower respiratory infection 07/18/2018   Lumbar back pain 05/10/2018   Lumbar radiculopathy 05/15/2018   Pain seems consistent with radiculopathy   MCI (mild cognitive impairment) with memory loss 12/31/2020   Midline cystocele    Mild asthma 08/27/2020   no inhaler   Obstructive sleep apnea 08/06/2021   Intolerant  of cpap   Onychomycosis 02/21/2018   Toenails; Ciclopirox  prescribed   Oropharyngeal dysphagia 08/16/2016   Persistent cough 03/17/2019   Primary stress urinary incontinence 10/27/2021   Rib pain    Sciatica    right   Sinus headache 12/15/2022   Sinus infection 03/22/2019   SVD (spontaneous vaginal delivery)    x 2   Tinnitus of right ear 11/16/2016   no current problem as of 08/26/21 per patient   Upper respiratory infection 11/09/2016   Urinary hesitancy 12/31/2020   Viral respiratory infection 12/04/2019   Vitamin B12 deficiency 02/23/2018   Past Surgical History:  Procedure Laterality Date   ABDOMINAL HYSTERECTOMY     partial   ANAL FISSURE REPAIR     APPENDECTOMY     BRONCHIAL WASHINGS  08/30/2021   Procedure: BRONCHIAL WASHINGS;  Surgeon: Shelah Lamar RAMAN, MD;  Location: [facility] ENDOSCOPY;  Service: Cardiopulmonary;;   CHOLECYSTECTOMY     CYSTOSCOPY N/A 10/04/2017   Procedure: PHYLLIS;  Surgeon: Rosalva Sawyer, MD;  Location: [facility] ORS;  Service: Gynecology;  Laterality: N/A;   FOOT SURGERY Left    big toe bunion removed   LAPAROSCOPIC BILATERAL SALPINGO OOPHERECTOMY Bilateral 10/04/2017   Procedure: LAPAROSCOPIC BILATERAL SALPINGO OOPHORECTOMY, PELVIC WASHINGS;  Surgeon: Rosalva Sawyer, MD;  Location: [facility] ORS;  Service: Gynecology;  Laterality: Bilateral;   SACRAL COLPOPEXY ROBOTIC, posterior repair, cystoscopy  07/04/2019   @ [facility]   TUBAL LIGATION     VIDEO BRONCHOSCOPY N/A 08/30/2021   Procedure: VIDEO BRONCHOSCOPY WITHOUT FLUORO;  Surgeon: Shelah Lamar RAMAN, MD;  Location: [facility] ENDOSCOPY;  Service: Cardiopulmonary;  Laterality: N/A;   WISDOM TOOTH EXTRACTION     Family History  Problem Relation Age of Onset   Breast cancer Sister    Diabetes Sister    Glaucoma Sister    Cataracts Sister    Hypertension Sister    Anuerysm Mother 33   Pancreatic cancer Father    Retinal detachment Brother    Cataracts Sister    Breast cancer Cousin    Anuerysm Cousin    Breast cancer Niece     Esophageal cancer Nephew    Developmental delay Nephew    Cerebral palsy Son        mild   Colon polyps Neg Hx    Prostate cancer Neg Hx    Rectal cancer Neg Hx    Stomach cancer Neg Hx    Uterine cancer Neg Hx    Social History   Socioeconomic History   Marital status: Single    Spouse name: Not on file   Number of children: 2   Years of education: 14   Highest education level: Associate degree: occupational, Scientist, product/process development, or vocational program  Occupational History   Occupation: Disability  Tobacco Use   Smoking status: Never   Smokeless tobacco: Never  Vaping Use   Vaping status: Never Used  Substance and Sexual Activity   Alcohol use: No    Alcohol/week: 0.0 standard drinks of alcohol   Drug use: No   Sexual activity: Yes  Birth control/protection: Surgical    Comment: Hysterectomy  Other Topics Concern   Not on file  Social History Narrative   Yes to regular exercise at [facility]      Pt is right handed, she occasionally drinks tea, walks QOD. She lives with her 78 yr old son, he has mild cerebral palsy.   Social Drivers of Corporate investment banker Strain: Low Risk  (06/10/2024)   Overall Financial Resource Strain (CARDIA)    Difficulty of Paying Living Expenses: Not hard at all  Food Insecurity: No Food Insecurity (06/10/2024)   Hunger Vital Sign    Worried About Running Out of Food in the Last Year: Never true    Ran Out of Food in the Last Year: Never true  Transportation Needs: No Transportation Needs (06/10/2024)   PRAPARE - Administrator, Civil Service (Medical): No    Lack of Transportation (Non-Medical): No  Physical Activity: Insufficiently Active (06/10/2024)   Exercise Vital Sign    Days of Exercise per Week: 3 days    Minutes of Exercise per Session: 20 min  Stress: No Stress Concern Present (06/10/2024)   [employer] of Occupational Health - Occupational Stress Questionnaire    Feeling of Stress: Only a little  Social  Connections: Moderately Isolated (06/10/2024)   Social Connection and Isolation Panel    Frequency of Communication with Friends and Family: More than three times a week    Frequency of Social Gatherings with Friends and Family: Once a week    Attends Religious Services: More than 4 times per year    Active Member of [employer] or Organizations: No    Attends Engineer, structural: Never    Marital Status: Divorced    Tobacco Counseling Counseling given: Not Answered    Clinical Intake:  Pre-visit preparation completed: Yes  Pain : 0-10 Pain Score: 6  Pain Type: Acute pain Pain Location: Knee Pain Orientation: Right Pain Descriptors / Indicators: Constant Pain Onset: In the past 7 days Pain Frequency: Intermittent Pain Relieving Factors: Tylenol   Pain Relieving Factors: Tylenol   BMI - recorded: 31.68 Nutritional Risks: None Diabetes: No  Lab Results  Component Value Date   HGBA1C 5.3 01/16/2024   HGBA1C 5.0 12/28/2022   HGBA1C 5.0 06/23/2022     How often do you need to have someone help you when you read instructions, pamphlets, or other written materials from your doctor or pharmacy?: 1 - Never  Interpreter Needed?: No  Information entered by :: Kathleen Solis, Kathleen Solis   Activities of Daily Living     06/10/2024   10:57 AM  In your present state of health, do you have any difficulty performing the following activities:  Hearing? 0  Vision? 0  Difficulty concentrating or making decisions? 0  Walking or climbing stairs? 0  Dressing or bathing? 0  Doing errands, shopping? 0  Preparing Food and eating ? N  Using the Toilet? N  In the past six months, have you accidently leaked urine? Y  Comment wears a pantyliner  Do you have problems with loss of bowel control? N  Managing your Medications? N  Managing your Finances? N  Housekeeping or managing your Housekeeping? N    Patient Care Team: Geofm Glade PARAS, MD as PCP - General (Internal  Medicine) Loni Soyla LABOR, MD as PCP - Cardiology (Cardiology) Skeet Juliene SAUNDERS, DO as Consulting Physician (Neurology) Francella Fairy SAILOR, OD as Consulting Physician (Optometry)  I have updated your Care Teams  any recent Medical Services you may have received from other providers in the past year.     Assessment:   This is a routine wellness examination for Kathleen Solis.  Hearing/Vision screen Hearing Screening - Comments:: Denies hearing difficulties   Vision Screening - Comments:: Wears rx glasses - up to date with routine eye exams with [facility]   Goals Addressed               This Visit's Progress     Patient Stated (pt-stated)        Patient stated she plans to continue manage her meds       Depression Screen     06/10/2024   10:58 AM 10/17/2023    2:01 PM 07/03/2023    8:00 AM 06/08/2023   11:05 AM 02/14/2023    8:07 AM 02/10/2023    4:09 PM 10/20/2022    1:07 PM  PHQ 2/9 Scores  PHQ - 2 Score 0 0 0 0 0 0 0  PHQ- 9 Score 1  0 4 0 0     Fall Risk     06/10/2024   10:58 AM 10/17/2023    2:01 PM 07/03/2023    8:00 AM 06/08/2023   11:00 AM 02/14/2023    8:06 AM  Fall Risk   Falls in the past year? 1 0 0 0 0  Number falls in past yr: 0 0 0 0 0  Comment 1 - 1 wk ago (Sunday)      Injury with Fall? 1 0 0 0 0  Comment bruised face; right knee pain      Risk for fall due to :  No Fall Risks No Fall Risks No Fall Risks No Fall Risks  Follow up Falls evaluation completed;Falls prevention discussed Falls prevention discussed Falls evaluation completed Falls prevention discussed Falls evaluation completed    MEDICARE RISK AT HOME:  Medicare Risk at Home Any stairs in or around the home?: No If so, are there any without handrails?: No Home free of loose throw rugs in walkways, pet beds, electrical cords, etc?: Yes Adequate lighting in your home to reduce risk of falls?: Yes Life alert?: No Use of a cane, walker or w/c?: No Grab bars in the bathroom?: No Shower  chair or bench in shower?: No Elevated toilet seat or a handicapped toilet?: No  TIMED UP AND GO:  Was the test performed?  No  Cognitive Function: 6CIT completed    06/08/2023   11:09 AM  MMSE - Mini Mental State Exam  Orientation to time 5  Orientation to Place 5  Registration 3  Attention/ Calculation 5  Recall 3  Language- name 2 objects 2  Language- repeat 1  Language- follow 3 step command 3  Language- read & follow direction 1  Write a sentence 1  Copy design 1  Total score 30      12/26/2017    1:00 PM  Montreal Cognitive Assessment   Visuospatial/ Executive (0/5) 3  Naming (0/3) 3  Attention: Read list of digits (0/2) 0  Attention: Read list of letters (0/1) 1  Attention: Serial 7 subtraction starting at 100 (0/3) 1  Language: Repeat phrase (0/2) 0  Language : Fluency (0/1) 0  Abstraction (0/2) 1  Delayed Recall (0/5) 0  Orientation (0/6) 5  Total 14  Adjusted Score (based on education) 14      10 /20/2025   11:01 AM 06/08/2023   11:01 AM 05/12/2022    9:43 AM  6CIT Screen  What Year? 0 points 0 points 0 points  What month? 0 points 0 points 0 points  What time? 0 points 0 points 0 points  Count back from 20 0 points 0 points 0 points  Months in reverse 0 points 0 points 0 points  Repeat phrase 0 points 0 points 0 points  Total Score 0 points 0 points 0 points    Immunizations Immunization History  Administered Date(s) Administered   PFIZER(Purple Top)SARS-COV-2 Vaccination 04/11/2020, 06/16/2020   PPD Test 05/23/2022    Screening Tests Health Maintenance  Topic Date Due   Pneumococcal Vaccine: 50+ Years (1 of 2 - PCV) Never done   Hepatitis B Vaccines 19-59 Average Risk (1 of 3 - 19+ 3-dose series) Never done   Zoster Vaccines- Shingrix (1 of 2) Never done   Influenza Vaccine  Never done   COVID-19 Vaccine (3 - 2025-26 season) 04/22/2024   DTaP/Tdap/Td (1 - Tdap) 01/15/2025 (Originally 12/13/1984)   Mammogram  09/07/2024   Medicare Annual  Wellness (AWV)  06/10/2025   Colonoscopy  02/08/2034   Hepatitis C Screening  Completed   HIV Screening  Completed   HPV VACCINES  Aged Out   Meningococcal B Vaccine  Aged Out    Health Maintenance Items Addressed:  I have recommended that this patient have a immunization for Influenza, Pneumonia, Hepatitis B, and Shingles but she declines at this time. I have discussed the risks and benefits of this procedure with her. The patient verbalizes understanding.   Additional Screening:  Vision Screening: Recommended annual ophthalmology exams for early detection of glaucoma and other disorders of the eye. Is the patient up to date with their annual eye exam?  Yes  Who is the provider or what is the name of the office in which the patient attends annual eye exams? [facility]  Dental Screening: Recommended annual dental exams for proper oral hygiene  Community Resource Referral / Chronic Care Management: CRR required this visit?  No   CCM required this visit?  No   Plan:    I have personally reviewed and noted the following in the patient's chart:   Medical and social history Use of alcohol, tobacco or illicit drugs  Current medications and supplements including opioid prescriptions. Patient is currently taking opioid prescriptions. Information provided to patient regarding non-opioid alternatives. Patient advised to discuss non-opioid treatment plan with their provider. Functional ability and status Nutritional status Physical activity Advanced directives List of other physicians Hospitalizations, surgeries, and ER visits in previous 12 months Vitals Screenings to include cognitive, depression, and falls Referrals and appointments  In addition, I have reviewed and discussed with patient certain preventive protocols, quality metrics, and best practice recommendations. A written personalized care plan for preventive services as well as general preventive health recommendations  were provided to patient.   Kathleen Solis, Kathleen Solis   06/10/2024   After Visit Summary: (In Person-Declined) Patient declined AVS at this time.  Notes: Scheduled Hospital f/u due to fall w/PCP for 06/11/2024.

## 2024-06-10 NOTE — Patient Instructions (Incomplete)
      Blood work was ordered.       Medications changes include :   None    A referral was ordered and someone will call you to schedule an appointment.     No follow-ups on file.

## 2024-06-10 NOTE — Progress Notes (Unsigned)
 Subjective:    Patient ID: Kathleen Solis, female    DOB: 11/20/1965, 58 y.o.   MRN: [id number]     HPI Kathleen Solis is here for follow up from the ED.   ED 06/02/24 - presented after syncope vs mechanical fall.  She was walking to her car that morning and she suddenly found herself on the ground.  She had positive LOC.  She was unsure if she tripped, fell or passed out.  She had a small abrasion on her right knee, bruising on her right forearm.  She had a headache which was chronic for months.  Had pain throughout her body.  She denies prodromal symptoms.  No chest pain, SOB.    Ct head, c-spine, maxillofacial and xray of knee negative for acute injury. EKG unchanged.  Labs unremarkable.  Troponin negative.  CK normal. UA w/o infection.  MRI brain w/o acute finding.  Neurologically intact.    She sees neurology and was advised to follow up with them.    Right knee hurts.   It is a little swollen.  It bends but hurts to bend.  She has been icing it.  Taking Tylenol .  Has tried biofreeze - helped a little.  Her face is a little sore - she has a bruise on her right upper cheek.   She has chronic headaches which are not new and not related to the fall.  She denies any lightheadedness, dizziness, numbness or tingling.    Medications and allergies reviewed with patient and updated if appropriate.  Current Outpatient Medications on File Prior to Visit  Medication Sig Dispense Refill   acetaminophen  (TYLENOL ) 500 MG tablet Take 500 mg by mouth every 6 (six) hours as needed.     atorvastatin  (LIPITOR) 20 MG tablet Take 1 tablet (20 mg total) by mouth daily. 90 tablet 3   fluticasone  (FLONASE ) 50 MCG/ACT nasal spray Place 2 sprays into both nostrils daily. 16 g 8   hydrochlorothiazide  (HYDRODIURIL ) 25 MG tablet TAKE 1 TABLET BY MOUTH DAILY 90 tablet 2   HYDROcodone -acetaminophen  (NORCO/VICODIN) 5-325 MG tablet Take 1 tablet by mouth every 6 (six) hours as needed for moderate pain (pain score  4-6). 15 tablet 0   metoprolol  succinate (TOPROL -XL) 100 MG 24 hr tablet TAKE 1 TABLET BY MOUTH AT BEDTIME 90 tablet 1   promethazine -dextromethorphan (PROMETHAZINE -DM) 6.25-15 MG/5ML syrup Take 5 mLs by mouth 4 (four) times daily as needed for cough. 240 mL 0   vitamin B-12 (CYANOCOBALAMIN ) 50 MCG tablet Take 50 mcg by mouth daily.     No current facility-administered medications on file prior to visit.     Review of Systems  Constitutional:  Negative for fever.  Respiratory:  Negative for shortness of breath.   Cardiovascular:  Negative for chest pain and palpitations.  Musculoskeletal:  Positive for arthralgias (rigth knee pain and swelling from fall).  Neurological:  Positive for headaches (chronic). Negative for dizziness, weakness, light-headedness and numbness.       Objective:   Vitals:   06/11/24 0817  BP: 118/80  Pulse: 63  Temp: 98.3 F (36.8 C)  SpO2: 95%   BP Readings from Last 3 Encounters:  06/11/24 118/80  06/10/24 120/72  06/02/24 109/84   Wt Readings from Last 3 Encounters:  06/11/24 189 lb (85.7 kg)  06/10/24 190 lb 6.4 oz (86.4 kg)  06/02/24 188 lb (85.3 kg)   Body mass index is 31.45 kg/m.    Physical Exam Constitutional:  General: She is not in acute distress.    Appearance: Normal appearance.  HENT:     Head: Normocephalic and atraumatic.  Eyes:     Conjunctiva/sclera: Conjunctivae normal.  Cardiovascular:     Rate and Rhythm: Normal rate and regular rhythm.     Heart sounds: Normal heart sounds.  Pulmonary:     Effort: Pulmonary effort is normal. No respiratory distress.     Breath sounds: Normal breath sounds. No wheezing.  Musculoskeletal:        General: Swelling (Right knee-mild effusion) and tenderness (Tenderness throughout right knee joint) present. No deformity.     Cervical back: Neck supple.     Right lower leg: No edema.     Left lower leg: No edema.  Lymphadenopathy:     Cervical: No cervical adenopathy.  Skin:     General: Skin is warm and dry.     Findings: Bruising (Right lower eye orbit, right anterior knee and left proximal-medial lower leg) present. No rash.  Neurological:     Mental Status: She is alert. Mental status is at baseline.  Psychiatric:        Mood and Affect: Mood normal.        Behavior: Behavior normal.        Lab Results  Component Value Date   WBC 8.7 06/02/2024   HGB 14.5 06/02/2024   HCT 40.1 06/02/2024   PLT 247 06/02/2024   GLUCOSE 119 (H) 06/02/2024   CHOL 128 01/16/2024   TRIG 73.0 01/16/2024   HDL 46.90 01/16/2024   LDLCALC 67 01/16/2024   ALT 17 06/02/2024   AST 23 06/02/2024   NA 139 06/02/2024   K 4.1 06/02/2024   CL 99 06/02/2024   CREATININE 0.76 06/02/2024   BUN 13 06/02/2024   CO2 30 06/02/2024   TSH 1.58 04/28/2023   INR 1.18 11/24/2015   HGBA1C 5.3 01/16/2024   MR BRAIN WO CONTRAST EXAM: MR Brain without Intravenous Contrast.  CLINICAL HISTORY: Syncope/presyncope, cerebrovascular cause suspected.  TECHNIQUE: Magnetic resonance images of the brain without intravenous contrast in multiple planes.  CONTRAST: Without.  COMPARISON: MR of the head without contrast 02/10/2023 and CT head without contrast 06/02/2024.  FINDINGS:  BRAIN: No restricted diffusion to indicate acute infarction. No intracranial mass or hemorrhage. No midline shift or extra-axial fluid collection. No cerebellar tonsillar ectopia. The central arterial and venous flow voids are patent. Scattered subcortical T2 hyperintensities, predominantly on the left, are similar to the prior exam.  VENTRICLES: No hydrocephalus.  ORBITS: The orbits are normal.  SINUSES AND MASTOIDS: The sinuses and mastoid air cells are clear.  BONES: No acute fracture or focal osseous lesion.  IMPRESSION: 1. No acute intracranial abnormality. 2. Scattered subcortical T2 hyperintensities, predominantly on the left, unchanged from prior. The finding is nonspecific but can be seen  in the setting of chronic microvascular ischemia, a demyelinating process such as multiple sclerosis, vasculitis, complicated migraine headaches, or as the sequelae of a prior infectious or inflammatory process.  Electronically signed by: Kathleen Necessary MD 06/02/2024 05:50 PM EDT RP Workstation: [id number] DG Knee Complete 4 Views Right CLINICAL DATA:  Status post fall.  EXAM: RIGHT KNEE - COMPLETE 4+ VIEW  COMPARISON:  None Available.  FINDINGS: No evidence of fracture, dislocation, or joint effusion. No evidence of arthropathy or other focal bone abnormality. There is mild vascular calcification. Soft tissues are otherwise unremarkable.  IMPRESSION: No acute osseous abnormality.  Electronically Signed   By: Suzen Dwane HERO.D.  On: 06/02/2024 14:56 CT Maxillofacial WO CM EXAM: CT HEAD, FACIAL BONES AND CERVICAL SPINE WITHOUT CONTRAST 06/02/2024 02:31:14 PM  TECHNIQUE: CT of the head, facial bones and cervical spine was performed without the administration of intravenous contrast. Multiplanar reformatted images are provided for review. Automated exposure control, iterative reconstruction, and/or weight based adjustment of the mA/kV was utilized to reduce the radiation dose to as low as reasonably achievable.  COMPARISON: None available.  CLINICAL HISTORY: Head trauma, moderate-severe; Polytrauma, blunt. fall  FINDINGS: CT HEAD  BRAIN AND VENTRICLES: No acute intracranial hemorrhage. No mass effect or midline shift. No extra-axial fluid collection. No evidence of acute infarct. No hydrocephalus.  SKULL AND SCALP: No acute skull fracture. No scalp hematoma.  CT FACIAL BONES  FACIAL BONES: No acute facial fracture. No mandibular dislocation. No suspicious bone lesion.  ORBITS: No acute traumatic injury.  SINUSES AND MASTOIDS: No acute abnormality.  SOFT TISSUES: No acute abnormality.  CT CERVICAL SPINE  BONES AND ALIGNMENT: No acute  fracture or traumatic malalignment.  DEGENERATIVE CHANGES: No significant degenerative changes.  SOFT TISSUES: No prevertebral soft tissue swelling.  IMPRESSION: 1. No acute intracranial abnormality. 2. No acute fracture or traumatic malalignment of the cervical spine. 3. No acute fracture of the facial bones.  Electronically signed by: Gilmore Molt MD 06/02/2024 02:42 PM EDT RP Workstation: [id number] CT Head Wo Contrast EXAM: CT HEAD, FACIAL BONES AND CERVICAL SPINE WITHOUT CONTRAST 06/02/2024 02:31:14 PM  TECHNIQUE: CT of the head, facial bones and cervical spine was performed without the administration of intravenous contrast. Multiplanar reformatted images are provided for review. Automated exposure control, iterative reconstruction, and/or weight based adjustment of the mA/kV was utilized to reduce the radiation dose to as low as reasonably achievable.  COMPARISON: None available.  CLINICAL HISTORY: Head trauma, moderate-severe; Polytrauma, blunt. fall  FINDINGS: CT HEAD  BRAIN AND VENTRICLES: No acute intracranial hemorrhage. No mass effect or midline shift. No extra-axial fluid collection. No evidence of acute infarct. No hydrocephalus.  SKULL AND SCALP: No acute skull fracture. No scalp hematoma.  CT FACIAL BONES  FACIAL BONES: No acute facial fracture. No mandibular dislocation. No suspicious bone lesion.  ORBITS: No acute traumatic injury.  SINUSES AND MASTOIDS: No acute abnormality.  SOFT TISSUES: No acute abnormality.  CT CERVICAL SPINE  BONES AND ALIGNMENT: No acute fracture or traumatic malalignment.  DEGENERATIVE CHANGES: No significant degenerative changes.  SOFT TISSUES: No prevertebral soft tissue swelling.  IMPRESSION: 1. No acute intracranial abnormality. 2. No acute fracture or traumatic malalignment of the cervical spine. 3. No acute fracture of the facial bones.  Electronically signed by: Gilmore Molt MD 06/02/2024  02:42 PM EDT RP Workstation: [id number] CT Cervical Spine Wo Contrast EXAM: CT HEAD, FACIAL BONES AND CERVICAL SPINE WITHOUT CONTRAST 06/02/2024 02:31:14 PM  TECHNIQUE: CT of the head, facial bones and cervical spine was performed without the administration of intravenous contrast. Multiplanar reformatted images are provided for review. Automated exposure control, iterative reconstruction, and/or weight based adjustment of the mA/kV was utilized to reduce the radiation dose to as low as reasonably achievable.  COMPARISON: None available.  CLINICAL HISTORY: Head trauma, moderate-severe; Polytrauma, blunt. fall  FINDINGS: CT HEAD  BRAIN AND VENTRICLES: No acute intracranial hemorrhage. No mass effect or midline shift. No extra-axial fluid collection. No evidence of acute infarct. No hydrocephalus.  SKULL AND SCALP: No acute skull fracture. No scalp hematoma.  CT FACIAL BONES  FACIAL BONES: No acute facial fracture. No mandibular dislocation. No suspicious bone lesion.  ORBITS: No acute traumatic injury.  SINUSES AND MASTOIDS: No acute abnormality.  SOFT TISSUES: No acute abnormality.  CT CERVICAL SPINE  BONES AND ALIGNMENT: No acute fracture or traumatic malalignment.  DEGENERATIVE CHANGES: No significant degenerative changes.  SOFT TISSUES: No prevertebral soft tissue swelling.  IMPRESSION: 1. No acute intracranial abnormality. 2. No acute fracture or traumatic malalignment of the cervical spine. 3. No acute fracture of the facial bones.  Electronically signed by: Gilmore Molt MD 06/02/2024 02:42 PM EDT RP Workstation: [id number]    Assessment & Plan:    See Problem List for Assessment and Plan of chronic medical problems.

## 2024-06-11 ENCOUNTER — Ambulatory Visit (INDEPENDENT_AMBULATORY_CARE_PROVIDER_SITE_OTHER): Admitting: Internal Medicine

## 2024-06-11 ENCOUNTER — Encounter: Payer: Self-pay | Admitting: Internal Medicine

## 2024-06-11 VITALS — BP 118/80 | HR 63 | Temp 98.3°F | Ht 65.0 in | Wt 189.0 lb

## 2024-06-11 DIAGNOSIS — R55 Syncope and collapse: Secondary | ICD-10-CM | POA: Diagnosis not present

## 2024-06-11 DIAGNOSIS — M25461 Effusion, right knee: Secondary | ICD-10-CM | POA: Diagnosis not present

## 2024-06-11 DIAGNOSIS — I10 Essential (primary) hypertension: Secondary | ICD-10-CM

## 2024-06-11 DIAGNOSIS — M25561 Pain in right knee: Secondary | ICD-10-CM

## 2024-06-11 DIAGNOSIS — R739 Hyperglycemia, unspecified: Secondary | ICD-10-CM | POA: Diagnosis not present

## 2024-06-11 NOTE — Assessment & Plan Note (Signed)
 Chronic No symptoms consistent with orthostatic hypotension that could explain the fall Blood pressure well controlled Continue HCTZ 25 mg daily, metoprolol  XL 100 mg daily

## 2024-06-11 NOTE — Assessment & Plan Note (Signed)
 Acute Related to recent fall when she landed on her knees X-ray in the ED negative for fracture She does have an effusion and abrasion on the knee Continue icing regularly Take Tylenol , use Biofreeze If not improving over the next several days would recommend that she see sports medicine

## 2024-06-11 NOTE — Assessment & Plan Note (Signed)
 Chronic Recent glucose in ED elevated-reassured her this does not indicate diabetes Last A1c was normal at 5.3%

## 2024-06-11 NOTE — Assessment & Plan Note (Signed)
 Recent syncopal episode Workup ruled out TIA, stroke, seizure No prodromal symptoms so unlikely hypotensive or hypoglycemia Likely mechanical fall-she was stepping off a curb and does not recall falling Fall precautions

## 2024-06-21 ENCOUNTER — Ambulatory Visit (INDEPENDENT_AMBULATORY_CARE_PROVIDER_SITE_OTHER): Admitting: Family Medicine

## 2024-06-21 ENCOUNTER — Other Ambulatory Visit: Payer: Self-pay

## 2024-06-21 VITALS — BP 130/84 | HR 64 | Ht 65.0 in | Wt 187.0 lb

## 2024-06-21 DIAGNOSIS — M25461 Effusion, right knee: Secondary | ICD-10-CM

## 2024-06-21 DIAGNOSIS — M25561 Pain in right knee: Secondary | ICD-10-CM | POA: Diagnosis not present

## 2024-06-21 NOTE — Progress Notes (Signed)
   Kathleen Ileana Collet, PhD, LAT, ATC acting as a scribe for Artist Lloyd, MD.  Kathleen Solis is a 58 y.o. female who presents to [facility] today for R knee pain. Pt was previously seen by Dr. Lloyd in 2022 for L shoulder and neck pain.  Today, pt c/o R knee pain ongoing since 10/12. She was walking to her car, lost consciousness, and found herself on the ground. She was seen at the ED following the incident. She notes a bump on the anterior aspect of her R knee, that is very TTP. She also notes injury to the R-side of her face and anterior lower L leg.  R Knee swelling: yes Mechanical symptoms: no Aggravates: walking, transitioning to stand Treatments tried: ice, Tylenol   Dx testing: 06/02/24 R knee XR  Pertinent review of systems: No fevers or chills  Relevant historical information: Carotid artery disease. History of TIAs  Exam:  BP 130/84   Pulse 64   Ht 5' 5 (1.651 m)   Wt 187 lb (84.8 kg)   SpO2 95%   BMI 31.12 kg/m  General: Well Developed, well nourished, and in no acute distress.   MSK: Right knee with bruising and swelling anterior knee.  Tender palpation.  Normal knee motion.  Intact strength.    Lab and Radiology Results  Diagnostic Limited MSK Ultrasound of: Right knee Soft tissue swelling present subcutaneous tissue superficial to patella consistent with prepatellar bursitis Impression: Prepatellar bursitis  EXAM: RIGHT KNEE - COMPLETE 4+ VIEW   COMPARISON:  None Available.   FINDINGS: No evidence of fracture, dislocation, or joint effusion. No evidence of arthropathy or other focal bone abnormality. There is mild vascular calcification. Soft tissues are otherwise unremarkable.   IMPRESSION: No acute osseous abnormality.     Electronically Signed   By: Suzen Dials M.D.   On: 06/02/2024 14:56 I, Artist Lloyd, personally (independently) visualized and performed the interpretation of the images attached in this  note.     Assessment and Plan: 58 y.o. female with right anterior knee pain after fall due to prepatellar bursitis and contusion.  Plan for compression and Voltaren gel.  Recheck in a month if not improved consider direct injection.   PDMP not reviewed this encounter. Orders Placed This Encounter  Procedures   US  LIMITED JOINT SPACE STRUCTURES LOW RIGHT(NO LINKED CHARGES)    Reason for Exam (SYMPTOM  OR DIAGNOSIS REQUIRED):   right knee pain    Preferred imaging location?:   [facility]   No orders of the defined types were placed in this encounter.    Discussed warning signs or symptoms. Please see discharge instructions. Patient expresses understanding.   The above documentation has been reviewed and is accurate and complete Artist Lloyd, M.D.

## 2024-06-21 NOTE — Patient Instructions (Addendum)
 Thank you for coming in today.   Please use Voltaren gel (Generic Diclofenac Gel) up to 4x daily for pain as needed.  This is available over-the-counter as both the name brand Voltaren gel and the generic diclofenac gel.   Use an Ace Wrap  Check back in 1 month  Work note provided

## 2024-07-07 ENCOUNTER — Encounter: Payer: Self-pay | Admitting: Internal Medicine

## 2024-07-07 NOTE — Progress Notes (Unsigned)
 Subjective:    Patient ID: Kathleen Solis, female    DOB: 1965/12/03, 58 y.o.   MRN: [id number]      HPI Stuart is here for a Physical exam and her chronic medical problems.   She started having a sore throat yesterday.  She was exposed to her adult son who was just diagnosed with strep and she was concerned she may have it.   Medications and allergies reviewed with patient and updated if appropriate.  Current Outpatient Medications on File Prior to Visit  Medication Sig Dispense Refill   acetaminophen  (TYLENOL ) 500 MG tablet Take 500 mg by mouth every 6 (six) hours as needed. Patient taking 40 mg now     aspirin  EC 325 MG tablet Take 325 mg by mouth daily.     atorvastatin  (LIPITOR) 40 MG tablet Take 40 mg by mouth daily.     ezetimibe (ZETIA) 10 MG tablet Take 10 mg by mouth daily.     fluticasone  (FLONASE ) 50 MCG/ACT nasal spray Place 2 sprays into both nostrils daily. 16 g 8   promethazine -dextromethorphan (PROMETHAZINE -DM) 6.25-15 MG/5ML syrup Take 5 mLs by mouth 4 (four) times daily as needed for cough. 240 mL 0   REPATHA SURECLICK 140 MG/ML SOAJ Inject into the skin.     vitamin B-12 (CYANOCOBALAMIN ) 50 MCG tablet Take 50 mcg by mouth daily.     No current facility-administered medications on file prior to visit.    Review of Systems  Constitutional:  Positive for diaphoresis (sweats). Negative for fever.  HENT:  Positive for sore throat (started yesterday).   Eyes:  Negative for visual disturbance.  Respiratory:  Negative for shortness of breath and wheezing. Cough: chronic.  Cardiovascular:  Negative for chest pain, palpitations and leg swelling.  Gastrointestinal:  Negative for abdominal pain, blood in stool, constipation and diarrhea.       No gerd  Genitourinary:  Negative for dysuria.  Musculoskeletal:  Positive for back pain (at times). Negative for arthralgias.  Skin:  Negative for rash.  Neurological:  Positive for headaches (chronic - not daily).  Negative for light-headedness.  Psychiatric/Behavioral:  Negative for dysphoric mood. The patient is not nervous/anxious.        Objective:   Vitals:   07/08/24 0956  BP: 124/80  Pulse: (!) 57  Temp: 98.6 F (37 C)  SpO2: 97%   Filed Weights   07/08/24 0956  Weight: 182 lb (82.6 kg)   Body mass index is 30.29 kg/m.  BP Readings from Last 3 Encounters:  07/08/24 124/80  06/21/24 130/84  06/11/24 118/80    Wt Readings from Last 3 Encounters:  07/08/24 182 lb (82.6 kg)  06/21/24 187 lb (84.8 kg)  06/11/24 189 lb (85.7 kg)       Physical Exam Constitutional: She appears well-developed and well-nourished. No distress.  HENT:  Head: Normocephalic and atraumatic.  Right Ear: External ear normal. Normal ear canal and TM Left Ear: External ear normal.  Normal ear canal and TM Mouth/Throat: Oropharynx is clear and moist.  Eyes: Conjunctivae normal.  Neck: Neck supple. No tracheal deviation present. No thyromegaly present.  No carotid bruit  Cardiovascular: Normal rate, regular rhythm and normal heart sounds.   No murmur heard.  No edema. Pulmonary/Chest: Effort normal and breath sounds normal. No respiratory distress. She has no wheezes. She has no rales.  Breast: deferred   Abdominal: Soft. She exhibits no distension. There is no tenderness.  Lymphadenopathy: She has no cervical adenopathy.  Skin: Skin is warm and dry. She is not diaphoretic.  Psychiatric: She has a normal mood and affect. Her behavior is normal.     Lab Results  Component Value Date   WBC 8.7 06/02/2024   HGB 14.5 06/02/2024   HCT 40.1 06/02/2024   PLT 247 06/02/2024   GLUCOSE 119 (H) 06/02/2024   CHOL 128 01/16/2024   TRIG 73.0 01/16/2024   HDL 46.90 01/16/2024   LDLCALC 67 01/16/2024   ALT 17 06/02/2024   AST 23 06/02/2024   NA 139 06/02/2024   K 4.1 06/02/2024   CL 99 06/02/2024   CREATININE 0.76 06/02/2024   BUN 13 06/02/2024   CO2 30 06/02/2024   TSH 1.58 04/28/2023   INR 1.18  11/24/2015   HGBA1C 5.3 01/16/2024         Assessment & Plan:   Physical exam: Screening blood work  ordered Exercise  not right now - will start back at the gym Weight  obese Substance abuse  none   Reviewed recommended immunizations.   Health Maintenance  Topic Date Due   Pneumococcal Vaccine: 50+ Years (1 of 2 - PCV) Never done   Hepatitis B Vaccines 19-59 Average Risk (1 of 3 - 19+ 3-dose series) Never done   Zoster Vaccines- Shingrix (1 of 2) Never done   Influenza Vaccine  Never done   Mammogram  09/07/2024   COVID-19 Vaccine (3 - 2025-26 season) 07/23/2024 (Originally 04/22/2024)   DTaP/Tdap/Td (1 - Tdap) 01/15/2025 (Originally 12/13/1985)   Medicare Annual Wellness (AWV)  06/10/2025   Colonoscopy  02/08/2034   Hepatitis C Screening  Completed   HIV Screening  Completed   HPV VACCINES  Aged Out   Meningococcal B Vaccine  Aged Out          See Problem List for Assessment and Plan of chronic medical problems.

## 2024-07-07 NOTE — Patient Instructions (Addendum)

## 2024-07-08 ENCOUNTER — Ambulatory Visit: Admitting: Internal Medicine

## 2024-07-08 VITALS — BP 124/80 | HR 57 | Temp 98.6°F | Ht 65.0 in | Wt 182.0 lb

## 2024-07-08 DIAGNOSIS — Z8673 Personal history of transient ischemic attack (TIA), and cerebral infarction without residual deficits: Secondary | ICD-10-CM

## 2024-07-08 DIAGNOSIS — R739 Hyperglycemia, unspecified: Secondary | ICD-10-CM | POA: Diagnosis not present

## 2024-07-08 DIAGNOSIS — Z0000 Encounter for general adult medical examination without abnormal findings: Secondary | ICD-10-CM

## 2024-07-08 DIAGNOSIS — E538 Deficiency of other specified B group vitamins: Secondary | ICD-10-CM

## 2024-07-08 DIAGNOSIS — I10 Essential (primary) hypertension: Secondary | ICD-10-CM | POA: Diagnosis not present

## 2024-07-08 DIAGNOSIS — D229 Melanocytic nevi, unspecified: Secondary | ICD-10-CM

## 2024-07-08 DIAGNOSIS — J029 Acute pharyngitis, unspecified: Secondary | ICD-10-CM | POA: Diagnosis not present

## 2024-07-08 DIAGNOSIS — Z8739 Personal history of other diseases of the musculoskeletal system and connective tissue: Secondary | ICD-10-CM

## 2024-07-08 DIAGNOSIS — E7800 Pure hypercholesterolemia, unspecified: Secondary | ICD-10-CM | POA: Diagnosis not present

## 2024-07-08 LAB — COMPREHENSIVE METABOLIC PANEL WITH GFR
ALT: 20 U/L (ref 0–35)
AST: 24 U/L (ref 0–37)
Albumin: 4.5 g/dL (ref 3.5–5.2)
Alkaline Phosphatase: 98 U/L (ref 39–117)
BUN: 15 mg/dL (ref 6–23)
CO2: 31 meq/L (ref 19–32)
Calcium: 10.2 mg/dL (ref 8.4–10.5)
Chloride: 98 meq/L (ref 96–112)
Creatinine, Ser: 0.73 mg/dL (ref 0.40–1.20)
GFR: 91.2 mL/min (ref >60.00)
Glucose, Bld: 109 mg/dL — ABNORMAL HIGH (ref 70–99)
Potassium: 3.7 meq/L (ref 3.5–5.1)
Sodium: 140 meq/L (ref 135–145)
Total Bilirubin: 1.2 mg/dL (ref 0.2–1.2)
Total Protein: 7.8 g/dL (ref 6.0–8.3)

## 2024-07-08 LAB — CBC
HCT: 42.5 % (ref 36.0–46.0)
Hemoglobin: 14.7 g/dL (ref 12.0–15.0)
MCHC: 34.6 g/dL (ref 30.0–36.0)
MCV: 93 fl (ref 78.0–100.0)
Platelets: 237 K/uL (ref 150.0–400.0)
RBC: 4.57 Mil/uL (ref 3.87–5.11)
RDW: 13.4 % (ref 11.5–15.5)
WBC: 7.8 K/uL (ref 4.0–10.5)

## 2024-07-08 LAB — LIPID PANEL
Cholesterol: 60 mg/dL (ref 0–200)
HDL: 49.4 mg/dL (ref >39.00)
LDL Cholesterol: 3 mg/dL (ref 0–99)
NonHDL: 10.64
Total CHOL/HDL Ratio: 1
Triglycerides: 38 mg/dL (ref 0.0–149.0)
VLDL: 7.6 mg/dL (ref 0.0–40.0)

## 2024-07-08 LAB — TSH: TSH: 1.89 u[IU]/mL (ref 0.35–5.50)

## 2024-07-08 LAB — HEMOGLOBIN A1C: Hgb A1c MFr Bld: 5.4 % (ref 4.6–6.5)

## 2024-07-08 LAB — VITAMIN B12: Vitamin B-12: 1268 pg/mL — ABNORMAL HIGH (ref 211–911)

## 2024-07-08 LAB — POCT RAPID STREP A (OFFICE): Rapid Strep A Screen: NEGATIVE

## 2024-07-08 MED ORDER — HYDROCHLOROTHIAZIDE 25 MG PO TABS: 25.0000 mg | ORAL_TABLET | Freq: Every day | ORAL | 3 refills | Status: DC

## 2024-07-08 MED ORDER — METOPROLOL SUCCINATE ER 100 MG PO TB24
100.0000 mg | ORAL_TABLET | Freq: Every day | ORAL | 3 refills | Status: AC
Start: 2024-07-08 — End: ?

## 2024-07-08 NOTE — Assessment & Plan Note (Addendum)
 Chronic Lipoprotein a elevated History of CVA Regular exercise and healthy diet encouraged Lab Results  Component Value Date   LDLCALC 67 01/16/2024   Check lipid panel, CMP, TSH Continue atorvastatin  40 mg daily, Zetia 10 mg daily and Repatha-managed by cardiology

## 2024-07-08 NOTE — Assessment & Plan Note (Signed)
 Chronic Lab Results  Component Value Date   HGBA1C 5.3 01/16/2024   Check a1c Low sugar / carb diet Stressed regular exercise

## 2024-07-08 NOTE — Assessment & Plan Note (Signed)
 Chronic ?Check B12 level ?

## 2024-07-08 NOTE — Assessment & Plan Note (Signed)
 Chronic Blood pressure well controlled CBC, CMP Continue HCTZ 25 mg daily, metoprolol  XL 100 mg daily

## 2024-07-08 NOTE — Assessment & Plan Note (Signed)
 Acute Rapid strep negative Symptomatic treatment

## 2024-07-08 NOTE — Addendum Note (Signed)
 Addended by: CLAUDENE TOBIAS PARAS on: 07/08/2024 04:27 PM   Modules accepted: Orders

## 2024-07-08 NOTE — Assessment & Plan Note (Signed)
 Chronic No recent flares Continue colchicine  as needed

## 2024-07-08 NOTE — Assessment & Plan Note (Addendum)
 H/o CVA Continue ASA 81 mg daily, atorvastatin  40 mg daily, Zetia 10 mg daily and Repatha BP controlled Encouraged healthy diet, regular exercise

## 2024-07-09 ENCOUNTER — Ambulatory Visit: Payer: Self-pay | Admitting: Internal Medicine

## 2024-07-12 ENCOUNTER — Ambulatory Visit: Admitting: Emergency Medicine

## 2024-07-12 ENCOUNTER — Encounter: Payer: Self-pay | Admitting: Emergency Medicine

## 2024-07-12 VITALS — BP 112/72 | HR 65 | Temp 97.8°F | Ht 65.5 in | Wt 190.4 lb

## 2024-07-12 DIAGNOSIS — R053 Chronic cough: Secondary | ICD-10-CM

## 2024-07-12 DIAGNOSIS — J4531 Mild persistent asthma with (acute) exacerbation: Secondary | ICD-10-CM | POA: Diagnosis not present

## 2024-07-12 DIAGNOSIS — R9389 Abnormal findings on diagnostic imaging of other specified body structures: Secondary | ICD-10-CM | POA: Diagnosis not present

## 2024-07-12 LAB — CBC WITH DIFFERENTIAL/PLATELET
Basophils Absolute: 0 K/uL (ref 0.0–0.1)
Basophils Relative: 0.6 % (ref 0.0–3.0)
Eosinophils Absolute: 0.1 K/uL (ref 0.0–0.7)
Eosinophils Relative: 1.8 % (ref 0.0–5.0)
HCT: 39.8 % (ref 36.0–46.0)
Hemoglobin: 13.7 g/dL (ref 12.0–15.0)
Lymphocytes Relative: 23.7 % (ref 12.0–46.0)
Lymphs Abs: 1.8 K/uL (ref 0.7–4.0)
MCHC: 34.5 g/dL (ref 30.0–36.0)
MCV: 92.5 fl (ref 78.0–100.0)
Monocytes Absolute: 0.6 K/uL (ref 0.1–1.0)
Monocytes Relative: 7.7 % (ref 3.0–12.0)
Neutro Abs: 5 K/uL (ref 1.4–7.7)
Neutrophils Relative %: 66.2 % (ref 43.0–77.0)
Platelets: 222 K/uL (ref 150.0–400.0)
RBC: 4.3 Mil/uL (ref 3.87–5.11)
RDW: 13.2 % (ref 11.5–15.5)
WBC: 7.5 K/uL (ref 4.0–10.5)

## 2024-07-12 NOTE — Assessment & Plan Note (Signed)
 Seems to be principally upper airway in nature based on her description and symptoms.  Question asthma component but she did not get much relief when she did a trial of Symbicort .  She may ultimately merit a repeat bronchoscopy or ENT evaluation for laryngoscopy.  Will work on treating chronic rhinitis as aggressively as possible.  She can continue to use Promethazine  DM for cough suppression.  She did not tolerate Tessalon  Perles.  Considering evaluation for Biologics as above.  Continue fluticasone  nasal spray, 2 sprays each nostril once daily. Restart your loratadine  10 mg once daily. Okay to continue to use your Promethazine  DM cough syrup for cough suppression We will check lab work today (IgE, CBC with differential).  We may decide to consider other medications to help with allergy and asthma which may be contributing to your cough

## 2024-07-12 NOTE — Assessment & Plan Note (Signed)
 Her principal problem is chronic cough and I think this is mainly upper airway in nature.  She has overall reassuring pulmonary function testing although has shown evidence for bronchodilator response in the past consistent with mild asthma.  She tried the Symbicort , did not see much benefit, has been using it only intermittently.  We will stop it for now.  Continue albuterol  as needed.  There is an allergy component to both her chronic cough and possible asthma.  I think will be reasonable to check an IgE, eosinophil count to see if she would qualify for Biologics which may give her benefit with regard to both issues and hopefully help her chronic cough.

## 2024-07-12 NOTE — Progress Notes (Signed)
 Subjective:    Patient ID: Kathleen Solis, female    DOB: March 12, 1966, 58 y.o.   MRN: [id number]  HPI  ROV 07/12/2024 --Kathleen Solis is 58 with a history of chronic cough and upper airway irritation syndrome superimposed on mild intermittent asthma.  She has GERD and chronic rhinitis which are exacerbating factors.  Noted to be colonized with Prevotella on bronchoscopy that was done on 08/2021.  Also with mild obstructive sleep apnea intolerant of CPAP.  She is used Promethazine  DM for cough suppression with some success.  In June she was started on Symbicort  80 to see if this would impact her symptoms.  Also asked to restart loratadine , start Pepcid  nightly.  She has been tried on empiric PPI in the past without any significant benefit.  Today she reports that she had a syncopal episode in October - no clear etiology. She is going to follow with Neurology at [facility]. She has been started on Repatha by Cardiology. Her cough is the same - dry, happens at any time. Can wake her from sleep. The pepcid  didn't help - she stopped after a month. She uses her cough medicine at night. Not currently on loratadine , allergy testing has been reassuring.   Current regimen includes fluticasone  nasal spray twice daily, Symbicort  but only on an as-needed basis, not on a schedule.  RADIOLOGY Chest CT: Subtle ground glass infiltrate in the intermedial left upper lobe has cleared; subtle basilar subpleural ground glass possibly reflecting atelectasis (07/27/2023)  DIAGNOSTIC Home sleep study: AHI of 7.1 per hour, higher during REM sleep (02/2023)  PATHOLOGY Bronchoscopy: Colonized with beta-lactamase positive Prevotella (08/2021)  PFT Pulmonary function testing from 06/01/2022 reviewed by me showed normal airflows without a bronchodilator response, normal lung volumes with the exception of an elevated RV that could suggest hyperinflation.  Normal diffusion capacity.  Normal flow-volume loop    Review of Systems   Constitutional:  Negative for fever and unexpected weight change.  HENT:  Negative for congestion, dental problem, ear pain, nosebleeds, postnasal drip, rhinorrhea, sinus pressure, sneezing, sore throat and trouble swallowing.   Eyes:  Negative for redness and itching.  Respiratory:  Positive for cough. Negative for chest tightness, shortness of breath and wheezing.   Cardiovascular:  Negative for palpitations and leg swelling.  Gastrointestinal:  Negative for nausea and vomiting.  Genitourinary:  Negative for dysuria.  Musculoskeletal:  Negative for joint swelling.  Skin:  Negative for rash.  Neurological:  Negative for headaches.  Hematological:  Does not bruise/bleed easily.  Psychiatric/Behavioral:  Negative for dysphoric mood. The patient is not nervous/anxious.        Objective:   Physical Exam Vitals:   07/12/24 0906  BP: 112/72  Pulse: 65  Temp: 97.8 F (36.6 C)  SpO2: 97%  Weight: 190 lb 6.4 oz (86.4 kg)  Height: 5' 5.5 (1.664 m)     Gen: Pleasant, well-nourished, in no distress,  normal affect  ENT: No lesions,  mouth clear,  oropharynx clear, no postnasal drip, strong voice, no throat clearing  Neck: No JVD, no stridor  Lungs: No use of accessory muscles, no crackles or wheezing on normal respiration, no wheeze on forced expiration  Cardiovascular: RRR, heart sounds normal, no murmur or gallops, no peripheral edema  Musculoskeletal: No deformities, no cyanosis or clubbing  Neuro: alert, awake, non focal  Skin: Warm, no lesions or rash      Assessment & Plan:  Mild asthma Her principal problem is chronic cough and I think  this is mainly upper airway in nature.  She has overall reassuring pulmonary function testing although has shown evidence for bronchodilator response in the past consistent with mild asthma.  She tried the Symbicort , did not see much benefit, has been using it only intermittently.  We will stop it for now.  Continue albuterol  as needed.   There is an allergy component to both her chronic cough and possible asthma.  I think will be reasonable to check an IgE, eosinophil count to see if she would qualify for Biologics which may give her benefit with regard to both issues and hopefully help her chronic cough.  Chronic cough Seems to be principally upper airway in nature based on her description and symptoms.  Question asthma component but she did not get much relief when she did a trial of Symbicort .  She may ultimately merit a repeat bronchoscopy or ENT evaluation for laryngoscopy.  Will work on treating chronic rhinitis as aggressively as possible.  She can continue to use Promethazine  DM for cough suppression.  She did not tolerate Tessalon  Perles.  Considering evaluation for Biologics as above.  Continue fluticasone  nasal spray, 2 sprays each nostril once daily. Restart your loratadine  10 mg once daily. Okay to continue to use your Promethazine  DM cough syrup for cough suppression We will check lab work today (IgE, CBC with differential).  We may decide to consider other medications to help with allergy and asthma which may be contributing to your cough  Abnormal CT of the chest Subtle ground glass infiltrate that improved on subsequent imaging, last was 07/2023.  She has a history of Prevotella colonization.  We will repeat her CT scan in December to check 1 year interval follow-up.   I personally spent a total of 42 minutes in the care of the patient today including preparing to see the patient, getting/reviewing separately obtained history, performing a medically appropriate exam/evaluation, counseling and educating, placing orders, documenting clinical information in the EHR, independently interpreting results, and communicating results.    Kathleen Chris, MD, PhD 07/12/2024, 12:35 PM [facility] [phone] or if no answer [phone]

## 2024-07-12 NOTE — Assessment & Plan Note (Signed)
 Subtle ground glass infiltrate that improved on subsequent imaging, last was 07/2023.  She has a history of Prevotella colonization.  We will repeat her CT scan in December to check 1 year interval follow-up.

## 2024-07-12 NOTE — Patient Instructions (Addendum)
 Stop Symbicort  Keep albuterol  available to use 2 puffs to be needed for shortness of breath, chest tightness, wheezing. Continue fluticasone  nasal spray, 2 sprays each nostril once daily. Restart your loratadine  10 mg once daily. Okay to continue to use your Promethazine  DM cough syrup for cough suppression We will check lab work today (IgE, CBC with differential).  We may decide to consider other medications to help with allergy and asthma which may be contributing to your cough We will repeat your CT scan of the chest to compare with last year Depending on how things progress we may decide to repeat your bronchoscopy or refer you back to ENT for laryngoscopy Follow Dr. Shelah in January 2026 so we can review your CT chest and your lab work

## 2024-07-15 LAB — IGE: IgE (Immunoglobulin E), Serum: 69 kU/L (ref <=114)

## 2024-07-22 ENCOUNTER — Other Ambulatory Visit: Payer: Self-pay

## 2024-07-22 ENCOUNTER — Ambulatory Visit: Admitting: Family Medicine

## 2024-07-22 VITALS — BP 114/78 | HR 66 | Ht 65.5 in | Wt 188.0 lb

## 2024-07-22 DIAGNOSIS — M25561 Pain in right knee: Secondary | ICD-10-CM

## 2024-07-22 DIAGNOSIS — M25461 Effusion, right knee: Secondary | ICD-10-CM | POA: Diagnosis not present

## 2024-07-22 NOTE — Patient Instructions (Addendum)
 Thank you for coming in today.   Please use Voltaren gel (Generic Diclofenac Gel) up to 4x daily for pain as needed.  This is available over-the-counter as both the name brand Voltaren gel and the generic diclofenac gel.   Continue compression.   Consider body helix full knee sleeve because it has some padding too.   Return as needed.
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
## 2024-07-30 ENCOUNTER — Other Ambulatory Visit: Payer: Self-pay | Admitting: Emergency Medicine

## 2024-07-30 NOTE — Telephone Encounter (Unsigned)
 Copied from CRM #[id number]. Topic: Clinical - Medication Refill >> Jul 30, 2024 11:08 AM Russell PARAS wrote: Medication:   promethazine -dextromethorphan (PROMETHAZINE -DM) 6.25-15 MG/5ML syrup   Has the patient contacted their pharmacy? Yes (Agent: If no, request that the patient contact the pharmacy for the refill. If patient does not wish to contact the pharmacy document the reason why and proceed with request.) (Agent: If yes, when and what did the pharmacy advise?)  This is the patient's preferred pharmacy:  [facility] [id number] - [place], [place] - [address] [place] [address] Phone: [phone] Fax: [phone]  Is this the correct pharmacy for this prescription? Yes If no, delete pharmacy and type the correct one.   Has the prescription been filled recently? Yes  Is the patient out of the medication? No  Has the patient been seen for an appointment in the last year OR does the patient have an upcoming appointment? Yes, 07/12/24 w/Byrum  Can we respond through MyChart? Yes  Agent: Please be advised that Rx refills may take up to 3 business days. We ask that you follow-up with your pharmacy.

## 2024-07-31 MED ORDER — PROMETHAZINE-DM 6.25-15 MG/5ML PO SYRP: 5.0000 mL | ORAL_SOLUTION | Freq: Four times a day (QID) | ORAL | 0 refills | Status: DC | PRN

## 2024-07-31 NOTE — Telephone Encounter (Signed)
I filled for her

## 2024-08-09 ENCOUNTER — Encounter: Payer: Self-pay | Admitting: Emergency Medicine

## 2024-08-12 ENCOUNTER — Other Ambulatory Visit: Payer: Self-pay | Admitting: Internal Medicine

## 2024-08-12 DIAGNOSIS — Z1231 Encounter for screening mammogram for malignant neoplasm of breast: Secondary | ICD-10-CM

## 2024-08-19 ENCOUNTER — Ambulatory Visit
Admission: RE | Admit: 2024-08-19 | Discharge: 2024-08-19 | Disposition: A | Source: Ambulatory Visit | Attending: Emergency Medicine | Admitting: Emergency Medicine

## 2024-08-19 DIAGNOSIS — R053 Chronic cough: Secondary | ICD-10-CM

## 2024-08-26 ENCOUNTER — Other Ambulatory Visit: Payer: Self-pay | Admitting: Emergency Medicine

## 2024-08-26 NOTE — Telephone Encounter (Unsigned)
 Copied from CRM #[id number]. Topic: Clinical - Medication Refill >> Aug 26, 2024 10:18 AM Russell PARAS wrote: Medication:   promethazine -dextromethorphan (PROMETHAZINE -DM) 6.25-15 MG/5ML syrup  Has the patient contacted their pharmacy? Yes (Agent: If no, request that the patient contact the pharmacy for the refill. If patient does not wish to contact the pharmacy document the reason why and proceed with request.) (Agent: If yes, when and what did the pharmacy advise?)  This is the patient's preferred pharmacy:  [facility] [id number] - [place], [place] - [address] [place] [address] Phone: [phone] Fax: [phone]  [facility] [place], [place] - [address] [place] [address] Phone: [phone] Fax: [phone]  Is this the correct pharmacy for this prescription? Yes If no, delete pharmacy and type the correct one.   Has the prescription been filled recently? Yes  Is the patient out of the medication? No  Has the patient been seen for an appointment in the last year OR does the patient have an upcoming appointment? Yes, 07/12/24 w/Byrum  Can we respond through MyChart? Yes  Agent: Please be advised that Rx refills may take up to 3 business days. We ask that you follow-up with your pharmacy.

## 2024-08-28 MED ORDER — PROMETHAZINE-DM 6.25-15 MG/5ML PO SYRP: 5.0000 mL | ORAL_SOLUTION | Freq: Four times a day (QID) | ORAL | 0 refills | Status: AC | PRN

## 2024-09-02 ENCOUNTER — Telehealth: Payer: Self-pay

## 2024-09-02 NOTE — Telephone Encounter (Signed)
 Copied from CRM #[id number]. Topic: Clinical - Lab/Test Results >> Sep 02, 2024  2:47 PM Kathleen Solis wrote: Reason for CRM: Patient calling because she seen her CT results in MyChart and would like to speak with Dr Shelah or his nurse. Patient very concerned. Please call patient @ [phone].   Spoke with patient Kathleen Solis, provider will review and someone will reach out to her with results.   - NFN

## 2024-09-06 ENCOUNTER — Ambulatory Visit: Payer: Self-pay | Admitting: Emergency Medicine

## 2024-09-09 NOTE — Telephone Encounter (Signed)
 Spoke with patient Kathleen Solis, will see you at next OV

## 2024-09-10 ENCOUNTER — Ambulatory Visit
Admission: RE | Admit: 2024-09-10 | Discharge: 2024-09-10 | Disposition: A | Source: Ambulatory Visit | Attending: Internal Medicine

## 2024-09-10 DIAGNOSIS — Z1231 Encounter for screening mammogram for malignant neoplasm of breast: Secondary | ICD-10-CM

## 2024-09-20 ENCOUNTER — Ambulatory Visit: Admitting: Emergency Medicine

## 2024-09-20 ENCOUNTER — Encounter: Payer: Self-pay | Admitting: Emergency Medicine

## 2024-09-20 VITALS — BP 106/64 | HR 65 | Temp 98.2°F | Wt 187.2 lb

## 2024-09-20 DIAGNOSIS — J4531 Mild persistent asthma with (acute) exacerbation: Secondary | ICD-10-CM

## 2024-09-20 DIAGNOSIS — J309 Allergic rhinitis, unspecified: Secondary | ICD-10-CM

## 2024-09-20 DIAGNOSIS — R053 Chronic cough: Secondary | ICD-10-CM | POA: Diagnosis not present

## 2024-09-20 DIAGNOSIS — J45909 Unspecified asthma, uncomplicated: Secondary | ICD-10-CM | POA: Diagnosis not present

## 2024-09-20 NOTE — Progress Notes (Unsigned)
" ° °  Subjective:    Patient ID: Kathleen Solis, female    DOB: 03/12/1966, 59 y.o.   MRN: [id number]  Cough Pertinent negatives include no ear pain, eye redness, fever, headaches, postnasal drip, rash, rhinorrhea, sore throat, shortness of breath or wheezing.   ROV 09/20/2024 -- 59 year old woman with upper airway irritation syndrome and possible superimposed mild intermittent asthma, both impacted by chronic rhinitis, GERD.  She underwent bronchoscopy in 2023 that showed Prevotella, AFB and fungal negative.  She has mild obstructive sleep apnea but cannot wear CPAP.  Chest imaging in the past has shown some subtle ground glass infiltrate so we repeated a CT chest as below..  No overt GERD, no response to a trial of PPI or Pepcid .  Her allergy testing has been reassuring.  At her last visit I asked her to stop Symbicort  which she was only using intermittently.  She uses albuterol  rarely.  She has had some epistaxis on her flonase , has had to take a break from it. Not on an antihistamine right now.   Eosinophil 07/12/2024, 1.8%, absolute 0.1 (normal) IgE 07/12/2024, 69 (normal range)  CT chest 08/19/2024 reviewed by me showed mild patchy ground glass opacity in the medial right upper lobe and peripheral reticular opacities bilaterally.  There was a 2 mm right upper lobe nodule.  Review of Systems  Constitutional:  Negative for fever and unexpected weight change.  HENT:  Negative for congestion, dental problem, ear pain, nosebleeds, postnasal drip, rhinorrhea, sinus pressure, sneezing, sore throat and trouble swallowing.   Eyes:  Negative for redness and itching.  Respiratory:  Positive for cough. Negative for chest tightness, shortness of breath and wheezing.   Cardiovascular:  Negative for palpitations and leg swelling.  Gastrointestinal:  Negative for nausea and vomiting.  Genitourinary:  Negative for dysuria.  Musculoskeletal:  Negative for joint swelling.  Skin:  Negative for rash.   Neurological:  Negative for headaches.  Hematological:  Does not bruise/bleed easily.  Psychiatric/Behavioral:  Negative for dysphoric mood. The patient is not nervous/anxious.        Objective:   Physical Exam Vitals:   09/20/24 0920  BP: 106/64  Pulse: 65  Temp: 98.2 F (36.8 C)  SpO2: 98%  Weight: 187 lb 3.2 oz (84.9 kg)     Gen: Pleasant, well-nourished, in no distress,  normal affect  ENT: No lesions,  mouth clear,  oropharynx clear, no postnasal drip, strong voice, no throat clearing  Neck: No JVD, no stridor  Lungs: No use of accessory muscles, no crackles or wheezing on normal respiration, no wheeze on forced expiration  Cardiovascular: RRR, heart sounds normal, no murmur or gallops, no peripheral edema  Musculoskeletal: No deformities, no cyanosis or clubbing  Neuro: alert, awake, non focal  Skin: Warm, no lesions or rash      Assessment & Plan:  No problem-specific Assessment & Plan notes found for this encounter.    I personally spent a total of 42 minutes in the care of the patient today including preparing to see the patient, getting/reviewing separately obtained history, performing a medically appropriate exam/evaluation, counseling and educating, placing orders, documenting clinical information in the EHR, independently interpreting results, and communicating results.    Lamar Chris, MD, PhD 09/20/2024, 9:22 AM [facility] [phone] or if no answer [phone]  "

## 2024-09-20 NOTE — Patient Instructions (Signed)
 We reviewed your CT scan of the chest and lab work today. We will hold off on restarting Symbicort  for now. Keep your albuterol  available to use 2 puffs to be needed for shortness of breath, chest tightness, spells of coughing. Hold off on restarting your fluticasone  (Flonase ) nasal spray for now since you have been experiencing nosebleeding Continue to use nasal saline spray.  You can also try using nasal saline gel to keep the nasal passages moist. Try starting loratadine  10 mg (generic Claritin ) once daily. We will make a referral to our clinical pharmacist to start a medication called Tezspire Depending on how you are doing going forward we may consider repeating your bronchoscopy and your pulmonary function testing. Follow-up in our office in 2-3 months

## 2024-09-23 ENCOUNTER — Emergency Department (HOSPITAL_BASED_OUTPATIENT_CLINIC_OR_DEPARTMENT_OTHER)

## 2024-09-23 ENCOUNTER — Emergency Department (HOSPITAL_BASED_OUTPATIENT_CLINIC_OR_DEPARTMENT_OTHER): Admission: EM | Admit: 2024-09-23 | Discharge: 2024-09-23 | Disposition: A | Discharge status: Home / Self Care

## 2024-09-23 ENCOUNTER — Encounter (HOSPITAL_BASED_OUTPATIENT_CLINIC_OR_DEPARTMENT_OTHER): Payer: Self-pay

## 2024-09-23 ENCOUNTER — Emergency Department (HOSPITAL_COMMUNITY)

## 2024-09-23 ENCOUNTER — Other Ambulatory Visit: Payer: Self-pay

## 2024-09-23 ENCOUNTER — Ambulatory Visit: Payer: Self-pay

## 2024-09-23 DIAGNOSIS — R519 Headache, unspecified: Secondary | ICD-10-CM | POA: Insufficient documentation

## 2024-09-23 DIAGNOSIS — Z8673 Personal history of transient ischemic attack (TIA), and cerebral infarction without residual deficits: Secondary | ICD-10-CM | POA: Insufficient documentation

## 2024-09-23 DIAGNOSIS — Z7982 Long term (current) use of aspirin: Secondary | ICD-10-CM | POA: Insufficient documentation

## 2024-09-23 DIAGNOSIS — R42 Dizziness and giddiness: Secondary | ICD-10-CM | POA: Insufficient documentation

## 2024-09-23 LAB — CBC WITH DIFFERENTIAL/PLATELET
Abs Immature Granulocytes: 0.02 10*3/uL (ref 0.00–0.07)
Basophils Absolute: 0.1 10*3/uL (ref 0.0–0.1)
Basophils Relative: 1 %
Eosinophils Absolute: 0.1 10*3/uL (ref 0.0–0.5)
Eosinophils Relative: 1 %
HCT: 40.1 % (ref 36.0–46.0)
Hemoglobin: 14.2 g/dL (ref 12.0–15.0)
Immature Granulocytes: 0 %
Lymphocytes Relative: 18 %
Lymphs Abs: 1.5 10*3/uL (ref 0.7–4.0)
MCH: 31.7 pg (ref 26.0–34.0)
MCHC: 35.4 g/dL (ref 30.0–36.0)
MCV: 89.5 fL (ref 80.0–100.0)
Monocytes Absolute: 0.4 10*3/uL (ref 0.1–1.0)
Monocytes Relative: 5 %
Neutro Abs: 6.2 10*3/uL (ref 1.7–7.7)
Neutrophils Relative %: 75 %
Platelets: 245 10*3/uL (ref 150–400)
RBC: 4.48 MIL/uL (ref 3.87–5.11)
RDW: 13.7 % (ref 11.5–15.5)
WBC: 8.2 10*3/uL (ref 4.0–10.5)
nRBC: 0 % (ref 0.0–0.2)

## 2024-09-23 LAB — COMPREHENSIVE METABOLIC PANEL WITH GFR
ALT: 25 U/L (ref 0–44)
AST: 27 U/L (ref 15–41)
Albumin: 4.2 g/dL (ref 3.5–5.0)
Alkaline Phosphatase: 114 U/L (ref 38–126)
Anion gap: 10 (ref 5–15)
BUN: 15 mg/dL (ref 6–20)
CO2: 30 mmol/L (ref 22–32)
Calcium: 10.4 mg/dL — ABNORMAL HIGH (ref 8.9–10.3)
Chloride: 101 mmol/L (ref 98–111)
Creatinine, Ser: 0.66 mg/dL (ref 0.44–1.00)
GFR, Estimated: >60 mL/min
Glucose, Bld: 111 mg/dL — ABNORMAL HIGH (ref 70–99)
Potassium: 3.6 mmol/L (ref 3.5–5.1)
Sodium: 141 mmol/L (ref 135–145)
Total Bilirubin: 1 mg/dL (ref 0.0–1.2)
Total Protein: 7.6 g/dL (ref 6.5–8.1)

## 2024-09-23 LAB — CBG MONITORING, ED: Glucose-Capillary: 104 mg/dL — ABNORMAL HIGH (ref 70–99)

## 2024-09-23 LAB — URINALYSIS, ROUTINE W REFLEX MICROSCOPIC
Bilirubin Urine: NEGATIVE
Glucose, UA: NEGATIVE mg/dL
Hgb urine dipstick: NEGATIVE
Ketones, ur: NEGATIVE mg/dL
Leukocytes,Ua: NEGATIVE
Nitrite: NEGATIVE
Protein, ur: NEGATIVE mg/dL
Specific Gravity, Urine: 1.024 (ref 1.005–1.030)
pH: 6.5 (ref 5.0–8.0)

## 2024-09-23 MED ORDER — ACETAMINOPHEN 500 MG PO TABS
1000.0000 mg | ORAL_TABLET | Freq: Once | ORAL | Status: AC
  Administered 2024-09-23: 1000 mg via ORAL
  Filled 2024-09-23: qty 2

## 2024-09-23 MED ORDER — DIPHENHYDRAMINE HCL 50 MG/ML IJ SOLN: 25.0000 mg | Freq: Once | INTRAMUSCULAR | Status: DC

## 2024-09-23 MED ORDER — SODIUM CHLORIDE 0.9 % IV BOLUS
500.0000 mL | Freq: Once | INTRAVENOUS | Status: AC
  Administered 2024-09-23: 500 mL via INTRAVENOUS

## 2024-09-23 MED ORDER — KETOROLAC TROMETHAMINE 15 MG/ML IJ SOLN
15.0000 mg | Freq: Once | INTRAMUSCULAR | Status: AC
  Administered 2024-09-23: 15 mg via INTRAVENOUS
  Filled 2024-09-23: qty 1

## 2024-09-23 NOTE — ED Notes (Signed)
 Patient is A&Ox4 upon discharge. Patient verbalized understanding of follow-up care. Patient ambulatory from ED with steady gait.

## 2024-09-23 NOTE — ED Triage Notes (Signed)
 Pt POV d/t dizziness left tingling and headache with an intermittent nose bleed since Saturday.

## 2024-09-23 NOTE — Telephone Encounter (Signed)
 FYI Only or Action Required?: FYI only for provider: ED advised.  Patient was last seen in primary care on 07/08/2024 by Geofm Glade PARAS, MD.  Called Nurse Triage reporting Dizziness.  Symptoms began yesterday.  Interventions attempted: Nothing.  Symptoms are: unchanged.  Triage Disposition: Go to ED Now (Notify PCP)  Patient/caregiver understands and will follow disposition?: Yes, will follow disposition  Reason for Triage: Patient having dizziness, light headedness, nose bleeds and headache    Reason for Disposition  Loss of vision or double vision  (Exception: Similar to previous migraines.)  Answer Assessment - Initial Assessment Questions Blurry vision and HA, advised to go to ED  Protocols used: Dizziness - Lightheadedness-A-AH

## 2024-09-23 NOTE — ED Notes (Signed)
[facility] here to transport pt 

## 2024-09-24 ENCOUNTER — Ambulatory Visit: Admitting: Internal Medicine

## 2024-09-24 ENCOUNTER — Encounter: Payer: Self-pay | Admitting: Internal Medicine

## 2024-09-24 ENCOUNTER — Ambulatory Visit: Payer: Self-pay

## 2024-09-24 VITALS — BP 112/80 | HR 62 | Temp 98.2°F | Ht 65.5 in | Wt 190.0 lb

## 2024-09-24 DIAGNOSIS — G8929 Other chronic pain: Secondary | ICD-10-CM

## 2024-09-24 DIAGNOSIS — I10 Essential (primary) hypertension: Secondary | ICD-10-CM

## 2024-09-24 DIAGNOSIS — G4486 Cervicogenic headache: Secondary | ICD-10-CM

## 2024-09-24 DIAGNOSIS — R42 Dizziness and giddiness: Secondary | ICD-10-CM | POA: Insufficient documentation

## 2024-09-24 MED ORDER — HYDROCHLOROTHIAZIDE 12.5 MG PO TABS: 12.5000 mg | ORAL_TABLET | Freq: Every day | ORAL | 3 refills | Status: AC

## 2024-09-24 NOTE — Telephone Encounter (Signed)
 FYI Only or Action Required?: FYI only for provider: appointment scheduled on 09/24/24.  Patient was last seen in primary care on 07/08/2024 by Geofm Glade PARAS, MD.  Called Nurse Triage reporting Headache.  Symptoms began several days ago.  Interventions attempted: Nothing.  Symptoms are: unchanged.  Triage Disposition: See Physician Within 24 Hours  Patient/caregiver understands and will follow disposition?: Yes  Message from Alexandria E sent at 09/24/2024  8:40 AM EST  Summary: Head pain, back pain, nose bleeds   Reason for Triage: Patient was seen in the ED yesterday, still having head pain, nose bleeds, and lower back pain         Reason for Disposition  [1] MODERATE headache (e.g., interferes with normal activities) AND [2] present > 24 hours AND [3] unexplained  (Exceptions: Pain medicines not tried, typical migraine, or headache part of viral illness.)  Answer Assessment - Initial Assessment Questions Pt went to ED yesterday after being triaged, CT and MRI was good   1. LOCATION: Where does it hurt?      L side  2. ONSET: When did the headache start? (e.g., minutes, hours, days)      Sat/ Sun 3. PATTERN: Does the pain come and go, or has it been constant since it started?     Comes and goes 7/8 then goes 4/10 6. CAUSE: What do you think is causing the headache?     Unsure, was dx with Disc disease 2 years ago  9. OTHER SYMPTOMS: Do you have any other symptoms? (e.g., fever, stiff neck, eye pain, sore throat, cold symptoms)     Nose bleeding, ongoing several weeks, throbbing sensation from head to leg on L side  Protocols used: Headache-A-AH

## 2024-09-24 NOTE — Patient Instructions (Addendum)
" ° ° ° ° ° ° ° °  Medications changes include :   decrease hydrochlorothiazide  to 12.5 mg daily        Return for follow up as scheduled.  "

## 2024-09-24 NOTE — Assessment & Plan Note (Addendum)
 Chronic Blood pressure well controlled Having some lightheadedness after standing for a while-?  Orthostasis Decrease hydrochlorothiazide  to 12.5 mg  Continue metoprolol  XL 100 mg daily Monitor BP at home-discussed goal BP

## 2024-09-24 NOTE — Assessment & Plan Note (Signed)
 Acute Current headache that she has been experiencing for the past couple of days has been left posterior head radiating up towards the top of her left side of her head Sounds to be related to her neck Deferred gabapentin , steroids Deferred PT Advised not taking Tylenol  twice a day because of potential rebound headaches Continue heat, ice for neck.  Can try topical medications for neck Advised follow-up with neurology-has deferred preventative medication in the past

## 2024-09-25 NOTE — Assessment & Plan Note (Signed)
 Hold off on restarting your fluticasone  (Flonase ) nasal spray for now since you have been experiencing nosebleeding Continue to use nasal saline spray.  You can also try using nasal saline gel to keep the nasal passages moist. Try starting loratadine  10 mg (generic Claritin ) once daily. Plan for referral to clinical pharmacy regarding Tezspire as above

## 2024-09-25 NOTE — Assessment & Plan Note (Signed)
 Continue aggressive treatment of rhinitis.  She has not significant benefited from empiric GERD therapy.  I do think her symptoms are principally driven by upper airway irritation.  Plan to continue her asthma therapy as ordered, albuterol  as needed, hold off on restarting ICS/LAMA.

## 2024-09-25 NOTE — Assessment & Plan Note (Signed)
 She did not really miss the Symbicort .  She still has hoarseness and upper airway irritation, cough.  I suspect that mainly driven by rhinitis.  She might be a candidate for Tezspire (her IgE and eosinophil counts were normal).  Hopefully this would decrease her allergy burden and also her cough.  We will hold off on restarting Symbicort  for now. Keep your albuterol  available to use 2 puffs to be needed for shortness of breath, chest tightness, spells of coughing. We will make a referral to our clinical pharmacist to start a medication called Tezspire Depending on how you are doing going forward we may consider repeating your bronchoscopy and your pulmonary function testing. Follow-up in our office in 2-3 months

## 2024-12-20 ENCOUNTER — Ambulatory Visit: Admitting: Emergency Medicine

## 2025-01-06 ENCOUNTER — Ambulatory Visit: Admitting: Internal Medicine

## 2025-02-17 ENCOUNTER — Ambulatory Visit: Admitting: Physician Assistant

## 2025-07-09 ENCOUNTER — Encounter: Admitting: Internal Medicine

## 2025-07-09 ENCOUNTER — Ambulatory Visit
